# Patient Record
Sex: Female | Born: 1935 | Race: White | Hispanic: No | Marital: Married | Smoking: Former smoker
Health system: Southern US, Community
[De-identification: ages and names within clinical notes are randomized; demographics above are authoritative.]

## PROBLEM LIST (undated history)

## (undated) DIAGNOSIS — K219 Gastro-esophageal reflux disease without esophagitis: Secondary | ICD-10-CM

## (undated) DIAGNOSIS — J45909 Unspecified asthma, uncomplicated: Secondary | ICD-10-CM

## (undated) DIAGNOSIS — I10 Essential (primary) hypertension: Secondary | ICD-10-CM

## (undated) DIAGNOSIS — F322 Major depressive disorder, single episode, severe without psychotic features: Secondary | ICD-10-CM

## (undated) DIAGNOSIS — F17201 Nicotine dependence, unspecified, in remission: Secondary | ICD-10-CM

## (undated) DIAGNOSIS — E039 Hypothyroidism, unspecified: Secondary | ICD-10-CM

## (undated) DIAGNOSIS — E0590 Thyrotoxicosis, unspecified without thyrotoxic crisis or storm: Secondary | ICD-10-CM

## (undated) DIAGNOSIS — M1990 Unspecified osteoarthritis, unspecified site: Secondary | ICD-10-CM

## (undated) DIAGNOSIS — F418 Other specified anxiety disorders: Secondary | ICD-10-CM

## (undated) DIAGNOSIS — R110 Nausea: Secondary | ICD-10-CM

## (undated) DIAGNOSIS — E785 Hyperlipidemia, unspecified: Secondary | ICD-10-CM

## (undated) DIAGNOSIS — F419 Anxiety disorder, unspecified: Secondary | ICD-10-CM

## (undated) DIAGNOSIS — N189 Chronic kidney disease, unspecified: Secondary | ICD-10-CM

## (undated) DIAGNOSIS — R1030 Lower abdominal pain, unspecified: Secondary | ICD-10-CM

## (undated) DIAGNOSIS — Z9889 Other specified postprocedural states: Secondary | ICD-10-CM

## (undated) DIAGNOSIS — R102 Pelvic and perineal pain: Secondary | ICD-10-CM

## (undated) DIAGNOSIS — I129 Hypertensive chronic kidney disease with stage 1 through stage 4 chronic kidney disease, or unspecified chronic kidney disease: Secondary | ICD-10-CM

## (undated) DIAGNOSIS — B0229 Other postherpetic nervous system involvement: Secondary | ICD-10-CM

## (undated) DIAGNOSIS — I2510 Atherosclerotic heart disease of native coronary artery without angina pectoris: Secondary | ICD-10-CM

## (undated) DIAGNOSIS — J449 Chronic obstructive pulmonary disease, unspecified: Secondary | ICD-10-CM

## (undated) DIAGNOSIS — C55 Malignant neoplasm of uterus, part unspecified: Secondary | ICD-10-CM

## (undated) DIAGNOSIS — R197 Diarrhea, unspecified: Secondary | ICD-10-CM

## (undated) DIAGNOSIS — N390 Urinary tract infection, site not specified: Secondary | ICD-10-CM

## (undated) DIAGNOSIS — N183 Chronic kidney disease, stage 3 unspecified: Secondary | ICD-10-CM

## (undated) DIAGNOSIS — R079 Chest pain, unspecified: Secondary | ICD-10-CM

## (undated) DIAGNOSIS — E079 Disorder of thyroid, unspecified: Secondary | ICD-10-CM

## (undated) DIAGNOSIS — A0472 Enterocolitis due to Clostridium difficile, not specified as recurrent: Secondary | ICD-10-CM

## (undated) DIAGNOSIS — G8929 Other chronic pain: Secondary | ICD-10-CM

## (undated) DIAGNOSIS — F329 Major depressive disorder, single episode, unspecified: Secondary | ICD-10-CM

## (undated) DIAGNOSIS — R21 Rash and other nonspecific skin eruption: Secondary | ICD-10-CM

## (undated) DIAGNOSIS — B029 Zoster without complications: Secondary | ICD-10-CM

## (undated) HISTORY — PX: CATARACT EXTRACTION: SUR2

## (undated) HISTORY — DX: Chest pain, unspecified: R07.9

## (undated) HISTORY — DX: Gastro-esophageal reflux disease without esophagitis: K21.9

## (undated) HISTORY — DX: Anxiety disorder, unspecified: F41.9

## (undated) HISTORY — DX: Other specified anxiety disorders: F41.8

## (undated) HISTORY — DX: Enterocolitis due to Clostridium difficile, not specified as recurrent: A04.72

## (undated) HISTORY — DX: Unspecified asthma, uncomplicated: J45.909

## (undated) HISTORY — PX: NISSEN FUNDOPLICATION: SHX2091

## (undated) HISTORY — DX: Nausea: R11.0

## (undated) HISTORY — DX: Atherosclerotic heart disease of native coronary artery without angina pectoris: I25.10

## (undated) HISTORY — DX: Chronic obstructive pulmonary disease, unspecified: J44.9

## (undated) HISTORY — DX: Hyperlipidemia, unspecified: E78.5

## (undated) HISTORY — DX: Diarrhea, unspecified: R19.7

## (undated) HISTORY — DX: Other specified postprocedural states: Z98.890

## (undated) HISTORY — DX: Unspecified osteoarthritis, unspecified site: M19.90

## (undated) HISTORY — DX: Hypothyroidism, unspecified: E03.9

## (undated) HISTORY — DX: Urinary tract infection, site not specified: N39.0

## (undated) HISTORY — DX: Other postherpetic nervous system involvement: B02.29

## (undated) HISTORY — DX: Malignant neoplasm of uterus, part unspecified: C55

## (undated) HISTORY — DX: Hypertensive chronic kidney disease with stage 1 through stage 4 chronic kidney disease, or unspecified chronic kidney disease: I12.9

## (undated) HISTORY — DX: Disorder of thyroid, unspecified: E07.9

## (undated) HISTORY — DX: Other chronic pain: G89.29

## (undated) HISTORY — DX: Chronic kidney disease, stage 3 unspecified: N18.30

## (undated) HISTORY — PX: BREAST LUMPECTOMY: SHX2

## (undated) HISTORY — DX: Nicotine dependence, unspecified, in remission: F17.201

## (undated) HISTORY — DX: Pelvic and perineal pain: R10.2

## (undated) HISTORY — DX: Major depressive disorder, single episode, unspecified: F32.9

## (undated) HISTORY — DX: Major depressive disorder, single episode, severe without psychotic features: F32.2

## (undated) HISTORY — DX: Rash and other nonspecific skin eruption: R21

## (undated) HISTORY — DX: Chronic kidney disease, unspecified: N18.9

## (undated) HISTORY — DX: Lower abdominal pain, unspecified: R10.30

## (undated) HISTORY — PX: ABDOMINAL HYSTERECTOMY: SHX81

---

## 1960-03-27 HISTORY — PX: CHOLECYSTECTOMY: SHX55

## 1992-03-27 DIAGNOSIS — A0472 Enterocolitis due to Clostridium difficile, not specified as recurrent: Secondary | ICD-10-CM

## 1992-03-27 HISTORY — DX: Enterocolitis due to Clostridium difficile, not specified as recurrent: A04.72

## 1999-12-06 ENCOUNTER — Ambulatory Visit (HOSPITAL_COMMUNITY): Admission: RE | Admit: 1999-12-06 | Discharge: 1999-12-06 | Payer: Self-pay | Admitting: Internal Medicine

## 1999-12-15 ENCOUNTER — Encounter: Admission: RE | Admit: 1999-12-15 | Discharge: 1999-12-15 | Payer: Self-pay | Admitting: General Surgery

## 1999-12-15 ENCOUNTER — Encounter: Payer: Self-pay | Admitting: General Surgery

## 2000-01-06 ENCOUNTER — Inpatient Hospital Stay (HOSPITAL_COMMUNITY): Admission: RE | Admit: 2000-01-06 | Discharge: 2000-01-09 | Payer: Self-pay | Admitting: General Surgery

## 2000-01-08 ENCOUNTER — Encounter: Payer: Self-pay | Admitting: General Surgery

## 2000-01-09 ENCOUNTER — Encounter: Payer: Self-pay | Admitting: General Surgery

## 2000-02-10 ENCOUNTER — Encounter: Payer: Self-pay | Admitting: General Surgery

## 2000-02-10 ENCOUNTER — Encounter: Admission: RE | Admit: 2000-02-10 | Discharge: 2000-02-10 | Payer: Self-pay | Admitting: General Surgery

## 2000-03-13 ENCOUNTER — Encounter: Payer: Self-pay | Admitting: Internal Medicine

## 2000-03-13 ENCOUNTER — Ambulatory Visit (HOSPITAL_COMMUNITY): Admission: RE | Admit: 2000-03-13 | Discharge: 2000-03-13 | Payer: Self-pay | Admitting: Internal Medicine

## 2000-08-24 ENCOUNTER — Encounter: Payer: Self-pay | Admitting: Emergency Medicine

## 2000-08-24 ENCOUNTER — Emergency Department (HOSPITAL_COMMUNITY): Admission: EM | Admit: 2000-08-24 | Discharge: 2000-08-24 | Payer: Self-pay | Admitting: Emergency Medicine

## 2000-10-09 ENCOUNTER — Emergency Department (HOSPITAL_COMMUNITY): Admission: EM | Admit: 2000-10-09 | Discharge: 2000-10-09 | Payer: Self-pay | Admitting: Emergency Medicine

## 2000-12-15 ENCOUNTER — Encounter: Payer: Self-pay | Admitting: Internal Medicine

## 2000-12-15 ENCOUNTER — Inpatient Hospital Stay (HOSPITAL_COMMUNITY): Admission: EM | Admit: 2000-12-15 | Discharge: 2000-12-20 | Payer: Self-pay | Admitting: Cardiology

## 2000-12-23 ENCOUNTER — Emergency Department (HOSPITAL_COMMUNITY): Admission: EM | Admit: 2000-12-23 | Discharge: 2000-12-23 | Payer: Self-pay | Admitting: Emergency Medicine

## 2001-02-08 ENCOUNTER — Encounter: Payer: Self-pay | Admitting: Anesthesiology

## 2001-02-12 ENCOUNTER — Ambulatory Visit (HOSPITAL_COMMUNITY): Admission: RE | Admit: 2001-02-12 | Discharge: 2001-02-12 | Payer: Self-pay | Admitting: Ophthalmology

## 2001-06-07 ENCOUNTER — Emergency Department (HOSPITAL_COMMUNITY): Admission: EM | Admit: 2001-06-07 | Discharge: 2001-06-07 | Payer: Self-pay | Admitting: Emergency Medicine

## 2001-06-07 ENCOUNTER — Encounter: Payer: Self-pay | Admitting: Emergency Medicine

## 2001-08-27 ENCOUNTER — Ambulatory Visit (HOSPITAL_COMMUNITY): Admission: RE | Admit: 2001-08-27 | Discharge: 2001-08-27 | Payer: Self-pay | Admitting: Ophthalmology

## 2001-10-25 ENCOUNTER — Ambulatory Visit (HOSPITAL_COMMUNITY): Admission: RE | Admit: 2001-10-25 | Discharge: 2001-10-25 | Payer: Self-pay | Admitting: Pulmonary Disease

## 2001-11-08 ENCOUNTER — Ambulatory Visit (HOSPITAL_COMMUNITY): Admission: RE | Admit: 2001-11-08 | Discharge: 2001-11-08 | Payer: Self-pay | Admitting: Pulmonary Disease

## 2002-02-08 ENCOUNTER — Emergency Department (HOSPITAL_COMMUNITY): Admission: EM | Admit: 2002-02-08 | Discharge: 2002-02-08 | Payer: Self-pay | Admitting: Emergency Medicine

## 2002-07-28 ENCOUNTER — Emergency Department (HOSPITAL_COMMUNITY): Admission: EM | Admit: 2002-07-28 | Discharge: 2002-07-28 | Payer: Self-pay | Admitting: *Deleted

## 2002-07-28 ENCOUNTER — Encounter: Payer: Self-pay | Admitting: *Deleted

## 2002-08-13 ENCOUNTER — Ambulatory Visit (HOSPITAL_COMMUNITY): Admission: RE | Admit: 2002-08-13 | Discharge: 2002-08-13 | Payer: Self-pay | Admitting: Pulmonary Disease

## 2002-08-21 ENCOUNTER — Ambulatory Visit (HOSPITAL_COMMUNITY): Admission: RE | Admit: 2002-08-21 | Discharge: 2002-08-21 | Payer: Self-pay | Admitting: Pulmonary Disease

## 2002-09-03 ENCOUNTER — Observation Stay (HOSPITAL_COMMUNITY): Admission: EM | Admit: 2002-09-03 | Discharge: 2002-09-04 | Payer: Self-pay | Admitting: Cardiovascular Disease

## 2002-09-03 ENCOUNTER — Encounter: Payer: Self-pay | Admitting: Internal Medicine

## 2003-01-07 ENCOUNTER — Ambulatory Visit (HOSPITAL_COMMUNITY): Admission: RE | Admit: 2003-01-07 | Discharge: 2003-01-07 | Payer: Self-pay | Admitting: Pulmonary Disease

## 2003-09-30 ENCOUNTER — Ambulatory Visit (HOSPITAL_COMMUNITY): Admission: RE | Admit: 2003-09-30 | Discharge: 2003-09-30 | Payer: Self-pay | Admitting: Pulmonary Disease

## 2003-10-12 ENCOUNTER — Ambulatory Visit (HOSPITAL_COMMUNITY): Admission: RE | Admit: 2003-10-12 | Discharge: 2003-10-12 | Payer: Self-pay | Admitting: Ophthalmology

## 2004-03-11 ENCOUNTER — Ambulatory Visit (HOSPITAL_COMMUNITY): Admission: RE | Admit: 2004-03-11 | Discharge: 2004-03-11 | Payer: Self-pay | Admitting: Pulmonary Disease

## 2004-03-27 DIAGNOSIS — I2510 Atherosclerotic heart disease of native coronary artery without angina pectoris: Secondary | ICD-10-CM

## 2004-03-27 HISTORY — DX: Atherosclerotic heart disease of native coronary artery without angina pectoris: I25.10

## 2004-05-04 ENCOUNTER — Ambulatory Visit (HOSPITAL_COMMUNITY): Admission: RE | Admit: 2004-05-04 | Discharge: 2004-05-04 | Payer: Self-pay | Admitting: Pulmonary Disease

## 2004-06-17 ENCOUNTER — Emergency Department (HOSPITAL_COMMUNITY): Admission: EM | Admit: 2004-06-17 | Discharge: 2004-06-17 | Payer: Self-pay | Admitting: Emergency Medicine

## 2004-06-30 ENCOUNTER — Ambulatory Visit (HOSPITAL_COMMUNITY): Admission: RE | Admit: 2004-06-30 | Discharge: 2004-06-30 | Payer: Self-pay | Admitting: General Surgery

## 2004-10-01 ENCOUNTER — Emergency Department (HOSPITAL_COMMUNITY): Admission: EM | Admit: 2004-10-01 | Discharge: 2004-10-01 | Payer: Self-pay | Admitting: Emergency Medicine

## 2004-10-30 ENCOUNTER — Observation Stay (HOSPITAL_COMMUNITY): Admission: EM | Admit: 2004-10-30 | Discharge: 2004-11-02 | Payer: Self-pay | Admitting: Emergency Medicine

## 2004-10-31 ENCOUNTER — Ambulatory Visit: Payer: Self-pay | Admitting: *Deleted

## 2004-10-31 ENCOUNTER — Ambulatory Visit: Payer: Self-pay | Admitting: Urgent Care

## 2005-03-27 DIAGNOSIS — Z9889 Other specified postprocedural states: Secondary | ICD-10-CM

## 2005-03-27 HISTORY — DX: Other specified postprocedural states: Z98.890

## 2005-07-26 ENCOUNTER — Ambulatory Visit: Payer: Self-pay | Admitting: Internal Medicine

## 2005-07-28 ENCOUNTER — Ambulatory Visit (HOSPITAL_COMMUNITY): Admission: RE | Admit: 2005-07-28 | Discharge: 2005-07-28 | Payer: Self-pay | Admitting: Internal Medicine

## 2005-07-28 ENCOUNTER — Ambulatory Visit: Payer: Self-pay | Admitting: Internal Medicine

## 2005-08-16 ENCOUNTER — Ambulatory Visit (HOSPITAL_COMMUNITY): Admission: RE | Admit: 2005-08-16 | Discharge: 2005-08-16 | Payer: Self-pay | Admitting: Pulmonary Disease

## 2005-09-14 ENCOUNTER — Ambulatory Visit (HOSPITAL_COMMUNITY): Admission: RE | Admit: 2005-09-14 | Discharge: 2005-09-14 | Payer: Self-pay | Admitting: Pulmonary Disease

## 2006-03-15 ENCOUNTER — Ambulatory Visit (HOSPITAL_COMMUNITY): Admission: RE | Admit: 2006-03-15 | Discharge: 2006-03-15 | Payer: Self-pay | Admitting: Pulmonary Disease

## 2006-04-03 ENCOUNTER — Ambulatory Visit: Payer: Self-pay | Admitting: Internal Medicine

## 2006-05-10 ENCOUNTER — Ambulatory Visit: Payer: Self-pay | Admitting: Internal Medicine

## 2006-05-31 ENCOUNTER — Ambulatory Visit (HOSPITAL_COMMUNITY): Admission: RE | Admit: 2006-05-31 | Discharge: 2006-05-31 | Payer: Self-pay | Admitting: Pulmonary Disease

## 2006-08-30 ENCOUNTER — Ambulatory Visit: Payer: Self-pay | Admitting: Internal Medicine

## 2006-10-03 ENCOUNTER — Ambulatory Visit (HOSPITAL_COMMUNITY): Admission: RE | Admit: 2006-10-03 | Discharge: 2006-10-03 | Payer: Self-pay | Admitting: Pulmonary Disease

## 2007-03-14 ENCOUNTER — Ambulatory Visit: Payer: Self-pay | Admitting: Internal Medicine

## 2007-03-28 DIAGNOSIS — Z9889 Other specified postprocedural states: Secondary | ICD-10-CM

## 2007-03-28 HISTORY — DX: Other specified postprocedural states: Z98.890

## 2007-07-17 ENCOUNTER — Ambulatory Visit: Payer: Self-pay | Admitting: Internal Medicine

## 2007-09-21 ENCOUNTER — Emergency Department (HOSPITAL_COMMUNITY): Admission: EM | Admit: 2007-09-21 | Discharge: 2007-09-21 | Payer: Self-pay | Admitting: Emergency Medicine

## 2007-09-24 ENCOUNTER — Ambulatory Visit: Payer: Self-pay | Admitting: Gastroenterology

## 2007-09-26 ENCOUNTER — Ambulatory Visit (HOSPITAL_COMMUNITY): Admission: RE | Admit: 2007-09-26 | Discharge: 2007-09-26 | Payer: Self-pay | Admitting: Gastroenterology

## 2007-11-09 ENCOUNTER — Emergency Department (HOSPITAL_COMMUNITY): Admission: EM | Admit: 2007-11-09 | Discharge: 2007-11-09 | Payer: Self-pay | Admitting: Emergency Medicine

## 2007-11-27 ENCOUNTER — Ambulatory Visit (HOSPITAL_COMMUNITY): Admission: RE | Admit: 2007-11-27 | Discharge: 2007-11-27 | Payer: Self-pay | Admitting: Pulmonary Disease

## 2008-01-14 ENCOUNTER — Ambulatory Visit: Payer: Self-pay | Admitting: Internal Medicine

## 2008-01-16 ENCOUNTER — Ambulatory Visit: Payer: Self-pay | Admitting: Internal Medicine

## 2008-01-16 ENCOUNTER — Ambulatory Visit (HOSPITAL_COMMUNITY): Admission: RE | Admit: 2008-01-16 | Discharge: 2008-01-16 | Payer: Self-pay | Admitting: Internal Medicine

## 2008-03-02 ENCOUNTER — Ambulatory Visit: Payer: Self-pay | Admitting: Internal Medicine

## 2008-03-02 ENCOUNTER — Encounter: Payer: Self-pay | Admitting: Gastroenterology

## 2008-03-02 LAB — CONVERTED CEMR LAB
ALT: 14 units/L (ref 0–35)
Alkaline Phosphatase: 77 units/L (ref 39–117)
Basophils Absolute: 0.1 10*3/uL (ref 0.0–0.1)
Basophils Relative: 1 % (ref 0–1)
CO2: 23 meq/L (ref 19–32)
Creatinine, Ser: 1.32 mg/dL — ABNORMAL HIGH (ref 0.40–1.20)
Eosinophils Absolute: 0.2 10*3/uL (ref 0.0–0.7)
Eosinophils Relative: 3 % (ref 0–5)
HCT: 41.5 % (ref 36.0–46.0)
Hemoglobin: 13.8 g/dL (ref 12.0–15.0)
MCHC: 33.3 g/dL (ref 30.0–36.0)
MCV: 88.9 fL (ref 78.0–100.0)
Monocytes Absolute: 0.4 10*3/uL (ref 0.1–1.0)
RDW: 13.3 % (ref 11.5–15.5)
Total Bilirubin: 0.5 mg/dL (ref 0.3–1.2)

## 2008-03-04 ENCOUNTER — Ambulatory Visit (HOSPITAL_COMMUNITY): Admission: RE | Admit: 2008-03-04 | Discharge: 2008-03-04 | Payer: Self-pay | Admitting: Internal Medicine

## 2008-03-11 ENCOUNTER — Ambulatory Visit (HOSPITAL_COMMUNITY): Admission: RE | Admit: 2008-03-11 | Discharge: 2008-03-11 | Payer: Self-pay | Admitting: Internal Medicine

## 2008-03-19 ENCOUNTER — Encounter: Payer: Self-pay | Admitting: Gastroenterology

## 2008-03-19 LAB — CONVERTED CEMR LAB
CO2: 24 meq/L (ref 19–32)
Calcium: 9.2 mg/dL (ref 8.4–10.5)
Glucose, Bld: 79 mg/dL (ref 70–99)
Sodium: 140 meq/L (ref 135–145)

## 2008-07-07 ENCOUNTER — Telehealth (INDEPENDENT_AMBULATORY_CARE_PROVIDER_SITE_OTHER): Payer: Self-pay

## 2008-07-14 ENCOUNTER — Observation Stay (HOSPITAL_COMMUNITY): Admission: EM | Admit: 2008-07-14 | Discharge: 2008-07-16 | Payer: Self-pay | Admitting: Emergency Medicine

## 2008-07-14 ENCOUNTER — Ambulatory Visit: Payer: Self-pay | Admitting: Cardiology

## 2008-07-15 ENCOUNTER — Encounter: Payer: Self-pay | Admitting: Cardiology

## 2008-07-29 ENCOUNTER — Ambulatory Visit: Payer: Self-pay | Admitting: Cardiology

## 2008-08-06 ENCOUNTER — Encounter: Payer: Self-pay | Admitting: Internal Medicine

## 2008-09-15 ENCOUNTER — Ambulatory Visit (HOSPITAL_COMMUNITY): Admission: RE | Admit: 2008-09-15 | Discharge: 2008-09-15 | Payer: Self-pay | Admitting: Pulmonary Disease

## 2008-09-25 ENCOUNTER — Encounter: Payer: Self-pay | Admitting: Cardiology

## 2008-10-12 ENCOUNTER — Telehealth (INDEPENDENT_AMBULATORY_CARE_PROVIDER_SITE_OTHER): Payer: Self-pay

## 2008-10-23 ENCOUNTER — Encounter: Payer: Self-pay | Admitting: Gastroenterology

## 2008-10-30 DIAGNOSIS — E039 Hypothyroidism, unspecified: Secondary | ICD-10-CM | POA: Insufficient documentation

## 2008-10-30 DIAGNOSIS — J45909 Unspecified asthma, uncomplicated: Secondary | ICD-10-CM | POA: Insufficient documentation

## 2008-11-02 ENCOUNTER — Ambulatory Visit: Payer: Self-pay | Admitting: Cardiology

## 2008-11-02 ENCOUNTER — Encounter (INDEPENDENT_AMBULATORY_CARE_PROVIDER_SITE_OTHER): Payer: Self-pay | Admitting: *Deleted

## 2008-11-02 DIAGNOSIS — N184 Chronic kidney disease, stage 4 (severe): Secondary | ICD-10-CM | POA: Insufficient documentation

## 2008-11-02 DIAGNOSIS — N183 Chronic kidney disease, stage 3 (moderate): Secondary | ICD-10-CM

## 2008-11-02 DIAGNOSIS — R002 Palpitations: Secondary | ICD-10-CM

## 2008-11-02 LAB — CONVERTED CEMR LAB
Albumin: 4.1 g/dL
CO2: 22 meq/L
Calcium: 9.1 mg/dL
Glucose, Bld: 75 mg/dL
Potassium: 4.2 meq/L
Sodium: 141 meq/L

## 2008-11-09 ENCOUNTER — Ambulatory Visit (HOSPITAL_COMMUNITY): Admission: RE | Admit: 2008-11-09 | Discharge: 2008-11-09 | Payer: Self-pay | Admitting: Pulmonary Disease

## 2008-11-16 ENCOUNTER — Encounter: Payer: Self-pay | Admitting: Cardiology

## 2009-01-21 ENCOUNTER — Emergency Department (HOSPITAL_COMMUNITY): Admission: EM | Admit: 2009-01-21 | Discharge: 2009-01-21 | Payer: Self-pay | Admitting: Emergency Medicine

## 2009-04-30 ENCOUNTER — Encounter (INDEPENDENT_AMBULATORY_CARE_PROVIDER_SITE_OTHER): Payer: Self-pay | Admitting: *Deleted

## 2009-06-16 ENCOUNTER — Ambulatory Visit (HOSPITAL_COMMUNITY): Admission: RE | Admit: 2009-06-16 | Discharge: 2009-06-16 | Payer: Self-pay | Admitting: Pulmonary Disease

## 2009-06-16 ENCOUNTER — Encounter (INDEPENDENT_AMBULATORY_CARE_PROVIDER_SITE_OTHER): Payer: Self-pay | Admitting: *Deleted

## 2009-06-16 LAB — CONVERTED CEMR LAB
ALT: 15 units/L
Alkaline Phosphatase: 101 units/L
BUN: 17 mg/dL
Basophils Absolute: 0 10*3/uL
Bilirubin, Direct: 0.1 mg/dL
CO2: 25 meq/L
Calcium: 9.6 mg/dL
Chloride: 102 meq/L
Cholesterol: 171 mg/dL
Creatinine, Ser: 1.21 mg/dL
Glucose, Bld: 90 mg/dL
Hemoglobin: 14.1 g/dL
LDL Cholesterol: 91 mg/dL
Lymphocytes Relative: 34 %
MCV: 90.6 fL
Monocytes Absolute: 0.5 10*3/uL
Monocytes Relative: 9 %
Platelets: 244 10*3/uL
RDW: 13.9 %
Sodium: 137 meq/L
Sodium: 137 meq/L
TSH: 3.267 microintl units/mL
TSH: 3.267 microintl units/mL
Total Protein: 7.4 g/dL
Total Protein: 7.4 g/dL
Triglycerides: 84 mg/dL
WBC: 5.1 10*3/uL
WBC: 5.1 10*3/uL

## 2009-06-21 ENCOUNTER — Ambulatory Visit (HOSPITAL_COMMUNITY): Admission: RE | Admit: 2009-06-21 | Discharge: 2009-06-21 | Payer: Self-pay | Admitting: Pulmonary Disease

## 2009-07-23 ENCOUNTER — Encounter (INDEPENDENT_AMBULATORY_CARE_PROVIDER_SITE_OTHER): Payer: Self-pay | Admitting: *Deleted

## 2009-07-26 ENCOUNTER — Ambulatory Visit: Payer: Self-pay | Admitting: Cardiology

## 2009-08-03 ENCOUNTER — Encounter: Payer: Self-pay | Admitting: Cardiology

## 2009-09-20 ENCOUNTER — Ambulatory Visit: Payer: Self-pay | Admitting: Cardiology

## 2009-09-20 ENCOUNTER — Observation Stay (HOSPITAL_COMMUNITY): Admission: EM | Admit: 2009-09-20 | Discharge: 2009-09-22 | Payer: Self-pay | Admitting: Emergency Medicine

## 2009-09-21 ENCOUNTER — Encounter: Payer: Self-pay | Admitting: Cardiology

## 2009-12-18 ENCOUNTER — Emergency Department (HOSPITAL_COMMUNITY): Admission: EM | Admit: 2009-12-18 | Discharge: 2009-12-18 | Payer: Self-pay | Admitting: Emergency Medicine

## 2010-01-06 ENCOUNTER — Ambulatory Visit (HOSPITAL_COMMUNITY): Admission: RE | Admit: 2010-01-06 | Discharge: 2010-01-06 | Payer: Self-pay | Admitting: Pulmonary Disease

## 2010-02-01 ENCOUNTER — Encounter (INDEPENDENT_AMBULATORY_CARE_PROVIDER_SITE_OTHER): Payer: Self-pay | Admitting: *Deleted

## 2010-02-02 ENCOUNTER — Ambulatory Visit: Payer: Self-pay | Admitting: Cardiology

## 2010-03-03 ENCOUNTER — Ambulatory Visit: Payer: Self-pay | Admitting: Internal Medicine

## 2010-04-24 LAB — CONVERTED CEMR LAB
ALT: 15 units/L (ref 0–35)
CO2: 22 meq/L (ref 19–32)
Creatinine, Ser: 1.27 mg/dL — ABNORMAL HIGH (ref 0.40–1.20)
Glucose, Bld: 75 mg/dL (ref 70–99)
Total Bilirubin: 0.2 mg/dL — ABNORMAL LOW (ref 0.3–1.2)

## 2010-04-26 NOTE — Assessment & Plan Note (Signed)
Summary: 8 MTH F/U PER CHECKOUT ON 11/02/08/TG   Visit Type:  Follow-up Primary Provider:  Dr. Sinda Du  CC:  .Marland Kitchen  History of Present Illness: [name] is seen for continuing intermittent chest discomfort, a history of noncritical coronary disease and multiple cardiovascular risk factors.  She has remained depressed and anxious since her mother died 6 months ago.  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  In recent days, [name] has noted the recurrence of left-sided chest discomfort.  This is located across the upper chest and is moderate in severity.  There is no pleuritic component.  There is no associated dyspnea nor diaphoresis.  There is chest wall tenderness.  Symptoms have been fairly well relieved with one Darvocet as needed.  Preventive Screening-Counseling & Management  Alcohol-Tobacco     Smoking Status: current     Smoking Cessation Counseling: yes     Packs/Day: 1 PPD  Current Medications (verified): 1)  Lipitor 10 Mg Tabs (Atorvastatin Calcium) .Marland Kitchen.. 1 Tab At Bedtime 2)  Xanax 0.5 Mg Tabs (Alprazolam) .Marland Kitchen.. 1 Tab Four Times A Day 3)  Synthroid 75 Mcg Tabs (Levothyroxine Sodium) .Marland Kitchen.. 1 Tab Once Daily 4)  Aspirin 81 Mg Tbec (Aspirin) .... Take One Tablet By Mouth Daily 5)  Omeprazole 40 Mg Cpdr (Omeprazole) .... Take 1 Tablet By Mouth Two Times A Day 6)  Nicoderm Cq 14 Mg/24hr Pt24 (Nicotine) .... Every 12 Hrs. 7)  Verelan Pm 300 Mg Xr24h-Cap (Verapamil Hcl) .... Take 1 Tablet By Mouth Once A Day At Bedtime 8)  Fish Oil  Oil (Fish Oil) .... Take 1 Tablet By Mouth Once A Day 9)  Darvocet-N 100 100-650 Mg Tabs (Propoxyphene N-Apap) .... One Tablet By Mouth Twice A Day As Needed 10)  Aleve 220 Mg Tabs (Naproxen Sodium) .... Take 1-2 Tablets By Mouth Two Times A Day or Three Times A Day As Needed Chest Pain  Allergies (verified): 1)  ! Paxil 2)  ! Effexor 3)  ! *  Pravastatin  Comments:  Nurse/Medical Assistant: The patient's medications and allergies were reviewed with the patient and were updated in the Medication and Allergy Lists. Bottles and verbally reviewed.  Past History:  PMH, FH, and Social History reviewed and updated.  Family History: Mother: died in January 27, 2009; Alzheimer Disease, Atrial Fibrillation and a HX of MI; CHF with preserved LV systolic function Father:died in his 4's from a MI Siblings: Brother died in his 20's from a MI  Social History: Smoking Status:  current Packs/Day:  1 PPD  Review of Systems       See history of present illness.  Vital Signs:  Patient profile:   75 year old female Height:      60 inches Weight:      145 pounds Pulse rate:   64 / minute BP sitting:   146 / 85  (left arm) Cuff size:   large  Vitals Entered By: Georgina Peer (Jul 26, 2009 2:32 PM) CC: .   Physical Exam  General:    Anxious; overweight; well developed; no acute distress:   Neck-No JVD; no carotid bruits: Lungs-No tachypnea, no rales; no rhonchi; no wheezes; prolonged expiratory phase Cardiovascular-normal PMI; normal S1 and S2; chest wall tender to palpation. Abdomen-BS normal; soft and non-tender without masses or organomegaly:  Musculoskeletal-No deformities, no cyanosis or clubbing: Neurologic-Normal cranial nerves; symmetric strength and tone:  Skin-Warm, no significant lesions: Extremities-Nl distal pulses; trace edema:     Impression & Recommendations:  Problem # 1:  ATHEROSCLEROTIC CARDIOVASCULAR DISEASE-NONOBSTRUCTIVE (ICD-429.2) Patient had single vessel disease with a 50% first diagonal lesion at her last catheterization in 2004.  Current chest discomfort is very atypical and likely reflects a chest wall etiology.  I have asked her to try Aleve 200 mg b.i.d. or t.i.d. as needed plus topical heat.  She will supplement this with Darvocet if necessary.   Problem # 2:  HYPERLIPIDEMIA (P102836.4) Most  recent lipid profile one year ago was good.  Problem # 3:  HYPERTENSION (ICD-401.9) Blood pressure control is good.  Current medication will be continued.  Problem # 4:  TOBACCO ABUSE (ICD-305.1) Once again, we discussed the need to discontinue use of tobacco products.  Patient reports that she was close to quitting at the time of her mother's death, but has increased usage due to subsequent stress.  She developed nausea with a 21 mg nicotine patch.  She will attempt to quit once again using 14 mg patches.  Problem # 5:  ANXIETY (ICD-300.00) She takes one or 2 alprazolam 0.5 mg per day as needed.  She is highly resistant to considering use of antidepressant drugs.  I have asked her to seek evaluation with [facility] and will plan to reassess this nice woman in 6 months.  Other Orders: Misc. Referral (Misc. Ref)  Patient Instructions: 1)  Your physician recommends that you schedule a follow-up appointment in:  6 months 2)  Your physician has recommended you make the following change in your medication: aleve 1-2 tablets by mouth two times a day or three times a day as needed chest pain 3)  moist heat to left chest wall 4)  referral to behavioral health for anxiety depression prolonged grief reaction

## 2010-04-26 NOTE — Miscellaneous (Signed)
Summary: LABS CBCD,BMPLIPIDS,LIVER,A1C,TSH,06/16/2009  Clinical Lists Changes  Observations: Added new observation of CALCIUM: 9.6 mg/dL (06/16/2009 17:11) Added new observation of ALBUMIN: 4.5 g/dL (06/16/2009 17:11) Added new observation of PROTEIN, TOT: 7.4 g/dL (06/16/2009 17:11) Added new observation of SGPT (ALT): 15 units/L (06/16/2009 17:11) Added new observation of SGOT (AST): 20 units/L (06/16/2009 17:11) Added new observation of ALK PHOS: 101 units/L (06/16/2009 17:11) Added new observation of BILI DIRECT: 0.1 mg/dL (06/16/2009 17:11) Added new observation of CREATININE: 1.21 mg/dL (06/16/2009 17:11) Added new observation of BUN: 17 mg/dL (06/16/2009 17:11) Added new observation of BG RANDOM: 90 mg/dL (06/16/2009 17:11) Added new observation of CO2 PLSM/SER: 25 meq/L (06/16/2009 17:11) Added new observation of CL SERUM: 102 meq/L (06/16/2009 17:11) Added new observation of K SERUM: 4.9 meq/L (06/16/2009 17:11) Added new observation of NA: 137 meq/L (06/16/2009 17:11) Added new observation of LDL: 91 mg/dL (06/16/2009 17:11) Added new observation of HDL: 63 mg/dL (06/16/2009 17:11) Added new observation of TRIGLYC TOT: 84 mg/dL (06/16/2009 17:11) Added new observation of CHOLESTEROL: 171 mg/dL (06/16/2009 17:11) Added new observation of PLATELETK/UL: 244 K/uL (06/16/2009 17:11) Added new observation of MCV: 90.6 fL (06/16/2009 17:11) Added new observation of HCT: 43.5 % (06/16/2009 17:11) Added new observation of HGB: 14.1 g/dL (06/16/2009 17:11) Added new observation of WBC COUNT: 5.1 10*3/microliter (06/16/2009 17:11) Added new observation of TSH: 3.267 microintl units/mL (06/16/2009 17:11)

## 2010-04-26 NOTE — Miscellaneous (Signed)
Summary: labs cbcd,bmplipids,liver,tsh,06/16/2009  Clinical Lists Changes  Observations: Added new observation of CALCIUM: 9.6 mg/dL (06/16/2009 10:14) Added new observation of ALBUMIN: 4.5 g/dL (06/16/2009 10:14) Added new observation of PROTEIN, TOT: 7.4 g/dL (06/16/2009 10:14) Added new observation of SGPT (ALT): 15 units/L (06/16/2009 10:14) Added new observation of SGOT (AST): 20 units/L (06/16/2009 10:14) Added new observation of ALK PHOS: 101 units/L (06/16/2009 10:14) Added new observation of BILI DIRECT: 0.1 mg/dL (06/16/2009 10:14) Added new observation of CREATININE: 1.21 mg/dL (06/16/2009 10:14) Added new observation of BUN: 17 mg/dL (06/16/2009 10:14) Added new observation of BG RANDOM: 90 mg/dL (06/16/2009 10:14) Added new observation of CO2 PLSM/SER: 25 meq/L (06/16/2009 10:14) Added new observation of CL SERUM: 102 meq/L (06/16/2009 10:14) Added new observation of K SERUM: 4.9 meq/L (06/16/2009 10:14) Added new observation of NA: 137 meq/L (06/16/2009 10:14) Added new observation of LDL: 91 mg/dL (06/16/2009 10:14) Added new observation of HDL: 63 mg/dL (06/16/2009 10:14) Added new observation of TRIGLYC TOT: 84 mg/dL (06/16/2009 10:14) Added new observation of CHOLESTEROL: 171 mg/dL (06/16/2009 10:14) Added new observation of TSH: 3.267 microintl units/mL (06/16/2009 10:14) Added new observation of HGBA1C: 5.3 % (06/16/2009 10:14) Added new observation of ABSOLUTE BAS: 0.0 K/uL (06/16/2009 10:14) Added new observation of BASOPHIL %: 1 % (06/16/2009 10:14) Added new observation of EOS ABSLT: 0.1 K/uL (06/16/2009 10:14) Added new observation of % EOS AUTO: 2 % (06/16/2009 10:14) Added new observation of ABSOLUTE MON: 0.5 K/uL (06/16/2009 10:14) Added new observation of MONOCYTE %: 9 % (06/16/2009 10:14) Added new observation of ABS LYMPHOCY: 1.7 K/uL (06/16/2009 10:14) Added new observation of LYMPHS %: 34 % (06/16/2009 10:14) Added new observation of PLATELETK/UL: 244  K/uL (06/16/2009 10:14) Added new observation of RDW: 13.9 % (06/16/2009 10:14) Added new observation of MCHC RBC: 32.4 g/dL (06/16/2009 10:14) Added new observation of MCV: 90.6 fL (06/16/2009 10:14) Added new observation of HCT: 43.5 % (06/16/2009 10:14) Added new observation of HGB: 14.1 g/dL (06/16/2009 10:14) Added new observation of RBC M/UL: 4.80 M/uL (06/16/2009 10:14) Added new observation of WBC COUNT: 5.1 10*3/microliter (06/16/2009 10:14)

## 2010-04-26 NOTE — Assessment & Plan Note (Signed)
Summary: 6 MTH F/U PER CHECKOUT ON 07/26/09/TG   Visit Type:  Follow-up Primary Provider:  Dr. Sinda Du   History of Present Illness: [name] returns to the office as scheduled for continued assessment and treatment of chest pain, cardiovascular risk factors and depression.  She appears to have recovered somewhat from the death of her mother, but has still lost some of her enthusiasm for living.  She denies suicidal ideation.  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  [name] has difficulty sleeping and generalized fatigue but no specific cardiopulmonary symptoms.  Patient does not have substantial health concerns, but wonders why her right leg has a greater diameter than her left.  Current Medications (verified): 1)  Lipitor 10 Mg Tabs (Atorvastatin Calcium) .Marland Kitchen.. 1 Tab At Bedtime 2)  Xanax 0.5 Mg Tabs (Alprazolam) .Marland Kitchen.. 1 Tab Four Times A Day 3)  Synthroid 75 Mcg Tabs (Levothyroxine Sodium) .Marland Kitchen.. 1 Tab Once Daily 4)  Aspirin 81 Mg Tbec (Aspirin) .... Take One Tablet By Mouth Daily 5)  Omeprazole 40 Mg Cpdr (Omeprazole) .... Take 1 Tablet By Mouth Two Times A Day 6)  Verelan Pm 300 Mg Xr24h-Cap (Verapamil Hcl) .... Take 1 Tablet By Mouth Once A Day At Bedtime 7)  Fish Oil  Oil (Fish Oil) .... Take 1 Tablet By Mouth Once A Day 8)  Darvocet-N 100 100-650 Mg Tabs (Propoxyphene N-Apap) .... One Tablet As Needed  Allergies (verified): 1)  ! Paxil 2)  ! Effexor 3)  ! * Pravastatin  Comments:  Nurse/Medical Assistant: patient brought med list and stated that all meds are correct patient stated  that Dr.Hawkins started her on tekturna 150 mg  Past History:  PMH, FH, and Social History reviewed and updated.  Past Surgical History: Abdominal Hysterectomy-Total Cataract Extraction Cholecystectomy-1962 Lumpectomy Nissen Fundoplication Colonoscopy-?date uncertain  Review of  Systems       See history of present illness.  Vital Signs:  Patient profile:   75 year old female Weight:      145 pounds BMI:     28.42 O2 Sat:      99 % on Room air Pulse rate:   73 / minute BP sitting:   127 / 83  (right arm)  Vitals Entered By: Doretha Sou, CNA (February 02, 2010 2:01 PM)  O2 Flow:  Room air  Physical Exam  General:   well developed; no acute distress:   Neck-No JVD; no carotid bruits: Lungs-No tachypnea, no rales; no rhonchi; no wheezes; prolonged expiratory phase Cardiovascular-normal PMI; normal S1 and S2; modest basilar systolic ejection murmur Abdomen-BS normal; soft and non-tender without masses or organomegaly:  Musculoskeletal-No deformities, no cyanosis or clubbing: Neurologic-Normal cranial nerves; symmetric strength and tone:  Skin-Warm, no significant lesions: Extremities-Nl distal pulses; trace edema: diameter of right leg is greater at the knee and thigh where there are significant surface varicosities and trace edema.  This appears primarily related to osteoarthritic changes around the knee     Impression & Recommendations:  Problem # 1:  ATHEROSCLEROTIC CARDIOVASCULAR DISEASE-NONOBSTRUCTIVE (ICD-429.2) There are no symptoms to suggest progression of disease.  Our current approach of managing cardiovascular risk factors to the extent possible will be continued.  Problem # 2:  HYPERLIPIDEMIA (P102836.4) Lipid profile from earlier this year was acceptable.  Current therapy will be continued.  CHOL: 171 (06/16/2009)   LDL: 91 (06/16/2009)   HDL: 63 (06/16/2009)  TG: 84 (06/16/2009)  Problem # 3:  HYPERTENSION (ICD-401.9) Blood pressure control is good on current medication.  Problem # 4:  DEPRESSION (ICD-311) Patient has some vegetative signs, but is markedly improved over the last 6 months.  She now apparently has a new older individual to whom she can provide care, which is probably helping her as much as her medication.  Patient  refused to meet with a counselor and continues to do so.  At this point, it appears that she will recover based upon her own efforts.  Patient Instructions: 1)  Your physician recommends that you schedule a follow-up appointment in: 1 year

## 2010-04-26 NOTE — Miscellaneous (Signed)
Summary: LABS CMP,11/02/2008  Clinical Lists Changes  Observations: Added new observation of CALCIUM: 9.1 mg/dL (11/02/2008 14:57) Added new observation of ALBUMIN: 4.1 g/dL (11/02/2008 14:57) Added new observation of PROTEIN, TOT: 7.0 g/dL (11/02/2008 14:57) Added new observation of SGPT (ALT): 15 units/L (11/02/2008 14:57) Added new observation of SGOT (AST): 17 units/L (11/02/2008 14:57) Added new observation of ALK PHOS: 91 units/L (11/02/2008 14:57) Added new observation of CREATININE: 1.27 mg/dL (11/02/2008 14:57) Added new observation of BUN: 22 mg/dL (11/02/2008 14:57) Added new observation of BG RANDOM: 75 mg/dL (11/02/2008 14:57) Added new observation of CO2 PLSM/SER: 22 meq/L (11/02/2008 14:57) Added new observation of CL SERUM: 105 meq/L (11/02/2008 14:57) Added new observation of K SERUM: 4.2 meq/L (11/02/2008 14:57) Added new observation of NA: 141 meq/L (11/02/2008 14:57)

## 2010-04-26 NOTE — Letter (Signed)
Summary: pt canceled behavioral health appt  pt canceled behavioral health appt   Imported By: [place] Crane 08/03/2009 16:02:09  _____________________________________________________________________  External Attachment:    Type:   Image     Comment:   External Document

## 2010-04-26 NOTE — Consult Note (Signed)
Summary: [facility] - Dr. Lattie Haw, Cardiology  [facility]   Imported By: Sallee Provencal 10/06/2009 14:54:55  _____________________________________________________________________  External Attachment:    Type:   Image     Comment:   External Document

## 2010-05-02 ENCOUNTER — Encounter: Payer: Self-pay | Admitting: Internal Medicine

## 2010-05-02 ENCOUNTER — Ambulatory Visit (INDEPENDENT_AMBULATORY_CARE_PROVIDER_SITE_OTHER): Payer: Medicare Other | Admitting: Gastroenterology

## 2010-05-02 ENCOUNTER — Encounter: Payer: Self-pay | Admitting: Gastroenterology

## 2010-05-02 DIAGNOSIS — R109 Unspecified abdominal pain: Secondary | ICD-10-CM | POA: Insufficient documentation

## 2010-05-02 DIAGNOSIS — R1310 Dysphagia, unspecified: Secondary | ICD-10-CM

## 2010-05-02 DIAGNOSIS — K219 Gastro-esophageal reflux disease without esophagitis: Secondary | ICD-10-CM

## 2010-05-02 DIAGNOSIS — R1319 Other dysphagia: Secondary | ICD-10-CM | POA: Insufficient documentation

## 2010-05-04 ENCOUNTER — Encounter (INDEPENDENT_AMBULATORY_CARE_PROVIDER_SITE_OTHER): Payer: Medicare Other | Admitting: Internal Medicine

## 2010-05-04 ENCOUNTER — Encounter: Payer: Self-pay | Admitting: Internal Medicine

## 2010-05-04 DIAGNOSIS — R1084 Generalized abdominal pain: Secondary | ICD-10-CM

## 2010-05-09 DIAGNOSIS — R1310 Dysphagia, unspecified: Secondary | ICD-10-CM

## 2010-05-09 DIAGNOSIS — R109 Unspecified abdominal pain: Secondary | ICD-10-CM

## 2010-05-10 ENCOUNTER — Other Ambulatory Visit: Payer: Self-pay | Admitting: Internal Medicine

## 2010-05-10 ENCOUNTER — Ambulatory Visit (HOSPITAL_COMMUNITY)
Admission: RE | Admit: 2010-05-10 | Discharge: 2010-05-10 | Disposition: A | Discharge status: Home / Self Care | Payer: Medicare Other | Source: Ambulatory Visit | Attending: Internal Medicine | Admitting: Internal Medicine

## 2010-05-10 ENCOUNTER — Encounter: Payer: Medicare Other | Admitting: Internal Medicine

## 2010-05-10 DIAGNOSIS — Z79899 Other long term (current) drug therapy: Secondary | ICD-10-CM | POA: Insufficient documentation

## 2010-05-10 DIAGNOSIS — I10 Essential (primary) hypertension: Secondary | ICD-10-CM | POA: Insufficient documentation

## 2010-05-10 DIAGNOSIS — Z7982 Long term (current) use of aspirin: Secondary | ICD-10-CM | POA: Insufficient documentation

## 2010-05-10 DIAGNOSIS — R109 Unspecified abdominal pain: Secondary | ICD-10-CM | POA: Insufficient documentation

## 2010-05-10 DIAGNOSIS — R1310 Dysphagia, unspecified: Secondary | ICD-10-CM | POA: Insufficient documentation

## 2010-05-10 HISTORY — PX: ESOPHAGOGASTRODUODENOSCOPY: SHX1529

## 2010-05-12 ENCOUNTER — Encounter: Payer: Self-pay | Admitting: Internal Medicine

## 2010-05-12 NOTE — Letter (Signed)
Summary: EGD/ED ORDER  EGD/ED ORDER   Imported By: Sofie Rower 05/02/2010 15:08:52  _____________________________________________________________________  External Attachment:    Type:   Image     Comment:   External Document

## 2010-05-12 NOTE — Assessment & Plan Note (Signed)
Summary: dropped off stool sample  pt returned ifobt and it was negative  Allergies: 1)  ! Paxil 2)  ! Effexor 3)  ! * Pravastatin 4)  ! Vicodin 5)  ! Nubain   Other Orders: Immuno-chemical Fecal Occult [id number])   Orders Added: 1)  Immuno-chemical Fecal Occult [id number]
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
## 2010-05-17 NOTE — Op Note (Signed)
  NAME:  [name], [name]                ACCOUNT NO.:  [id number]  MEDICAL RECORD NO.:  [id number]           PATIENT TYPE:  O  LOCATION:  DAYP                          FACILITY:  [facility]  PHYSICIAN:  R. Garfield Cornea, M.D. DATE OF BIRTH:  Sep 14, 1935  DATE OF PROCEDURE:  05/10/2010 DATE OF DISCHARGE:                              OPERATIVE REPORT   INDICATIONS FOR PROCEDURE:  A 75 year old lady with recent nonspecific upper abdominal pain and recurrent esophageal dysphagia, history of a Nissen fundoplication in distant past, reflux symptoms well controlled with omeprazole 40 mg orally twice daily.  EGD now being done potential for esophageal dilation, etc. reviewed.  Please see documentation in the medical record for more information.  PROCEDURE NOTE:  O2 saturation, blood pressure, pulse, and respirations were monitored throughout the entirety of the procedure.  CONSCIOUS SEDATION:  Versed 5 mg IV, Demerol 75 mg IV in divided doses, Phenergan 12.5 mg IV diluted slow IV push to augment conscious sedation. Cetacaine spray for topical pharyngeal anesthesia.  FINDINGS:  Examination of tubular esophagus revealed a 3-4-mm "island" of salmon-colored epithelium just above the GE junction.  There was no obvious ring stricture or other abnormality.  There was no esophagitis. EG junction was easily traversed with the scope.  Stomach:  Gastric cavity was emptied and insufflated well with air.  Thorough examination of gastric mucosa including retroflexed proximal stomach esophagogastric junction demonstrated intact Nissen fundoplication, otherwise gastric mucosa appeared normal.  Pylorus was patent and easily traversed. Examination of the bulb and second portion revealed no abnormalities.  THERAPEUTIC/DIAGNOSTIC MANEUVERS PERFORMED:  The scope was withdrawn.  A 56-French Maloney dilator was passed to full insertion.  A look back revealed superficial tear of UES mucosa, otherwise no  complications related to passage of the dilator.  Subsequently, the [place] of salmon- colored epithelium was biopsied for histologic study.  The patient tolerated the procedure well and was reactive in endoscopy.  IMPRESSION: 1. Question island of salmon-colored epithelium in distal esophagus     status post biopsy preceded by a passage of a 56-French Maloney     dilator. 2. Antagonists fundoplication, otherwise normal stomach and normal D1     and D2.  RECOMMENDATIONS: 1. Continue omeprazole 40 mg orally twice daily. 2. Follow up on path. 3. Follow up appointment with [place] in office in 6 weeks to assess her     progress.     Bridgette Habermann, M.D.     RMR/MEDQ  D:  05/10/2010  T:  05/10/2010  Job:  [id number]  cc:   Percell Miller L. Luan Pulling, M.D. [phone]  Electronically Signed by Jannette Spanner M.D. on 05/17/2010 02:34:34 PM

## 2010-05-18 NOTE — Letter (Addendum)
Summary: Patient Notice, Endo Biopsy Results  [facility]  [address]   [place], [place] [address]   Phone: [phone]  Fax: [phone]       May 12, 2010   [name] [place], [place]  [id number] [birth year]    Dear [name],  I am pleased to inform you that the biopsies taken during your recent endoscopic examination did not show any evidence of cancer upon pathologic examination.  Therer was only mild inflammation.  Additional information/recommendations:  No further action is needed at this time.  Please follow-up with your primary care physician for your other healthcare needs.  Continue with the treatment plan as outlined on the day of your exam.  Please call us if you are having persistent problems or have questions about your condition that have not been fully answered at this time.  Sincerely,    R. Garfield Cornea MD, [facility] Ph: [phone]   Fax: [phone]   Appended Document: Patient Notice, Endo Biopsy Results letter mailed to pt

## 2010-06-12 LAB — POCT CARDIAC MARKERS
Myoglobin, poc: 103 ng/mL (ref 12–200)
Troponin i, poc: <0.05 ng/mL (ref 0.00–0.09)

## 2010-06-12 LAB — CK TOTAL AND CKMB (NOT AT ARMC)
CK, MB: 2.4 ng/mL (ref 0.3–4.0)
Relative Index: INVALID (ref 0.0–2.5)
Total CK: 92 U/L (ref 7–177)

## 2010-06-12 LAB — DIFFERENTIAL
Basophils Absolute: 0.1 10*3/uL (ref 0.0–0.1)
Eosinophils Absolute: 0.2 10*3/uL (ref 0.0–0.7)
Lymphs Abs: 2.2 10*3/uL (ref 0.7–4.0)
Monocytes Absolute: 0.4 10*3/uL (ref 0.1–1.0)
Neutrophils Relative %: 46 % (ref 43–77)

## 2010-06-12 LAB — BASIC METABOLIC PANEL
CO2: 25 mEq/L (ref 19–32)
Chloride: 104 mEq/L (ref 96–112)
Glucose, Bld: 102 mg/dL — ABNORMAL HIGH (ref 70–99)
Potassium: 4 mEq/L (ref 3.5–5.1)
Sodium: 135 mEq/L (ref 135–145)

## 2010-06-12 LAB — CARDIAC PANEL(CRET KIN+CKTOT+MB+TROPI)
CK, MB: 3.6 ng/mL (ref 0.3–4.0)
CK, MB: 3.8 ng/mL (ref 0.3–4.0)
Relative Index: 2.5 (ref 0.0–2.5)
Troponin I: 0.02 ng/mL (ref 0.00–0.06)

## 2010-06-12 LAB — CBC
HCT: 40.2 % (ref 36.0–46.0)
Hemoglobin: 13.9 g/dL (ref 12.0–15.0)
MCH: 30.6 pg (ref 26.0–34.0)
MCHC: 34.6 g/dL (ref 30.0–36.0)
MCV: 88.6 fL (ref 78.0–100.0)
RBC: 4.54 MIL/uL (ref 3.87–5.11)

## 2010-06-12 LAB — TROPONIN I
Troponin I: 0.02 ng/mL (ref 0.00–0.06)
Troponin I: <0.01 ng/mL (ref 0.00–0.06)

## 2010-06-21 ENCOUNTER — Ambulatory Visit: Payer: Medicare Other | Admitting: Gastroenterology

## 2010-06-30 LAB — POCT I-STAT, CHEM 8
Calcium, Ion: 1.17 mmol/L (ref 1.12–1.32)
Chloride: 107 mEq/L (ref 96–112)
Glucose, Bld: 88 mg/dL (ref 70–99)
HCT: 40 % (ref 36.0–46.0)
TCO2: 23 mmol/L (ref 0–100)

## 2010-07-06 ENCOUNTER — Telehealth: Payer: Self-pay | Admitting: Cardiology

## 2010-07-06 LAB — DIFFERENTIAL
Basophils Absolute: 0 10*3/uL (ref 0.0–0.1)
Basophils Absolute: 0 10*3/uL (ref 0.0–0.1)
Basophils Relative: 1 % (ref 0–1)
Basophils Relative: 1 % (ref 0–1)
Eosinophils Absolute: 0.1 10*3/uL (ref 0.0–0.7)
Eosinophils Absolute: 0.1 10*3/uL (ref 0.0–0.7)
Eosinophils Relative: 3 % (ref 0–5)
Lymphs Abs: 1.6 10*3/uL (ref 0.7–4.0)
Neutrophils Relative %: 55 % (ref 43–77)

## 2010-07-06 LAB — CARDIAC PANEL(CRET KIN+CKTOT+MB+TROPI)
CK, MB: 3.2 ng/mL (ref 0.3–4.0)
CK, MB: 4.1 ng/mL — ABNORMAL HIGH (ref 0.3–4.0)
CK, MB: 4.3 ng/mL — ABNORMAL HIGH (ref 0.3–4.0)
Relative Index: 3.8 — ABNORMAL HIGH (ref 0.0–2.5)
Total CK: 120 U/L (ref 7–177)
Troponin I: 0.02 ng/mL (ref 0.00–0.06)

## 2010-07-06 LAB — CBC
HCT: 41.9 % (ref 36.0–46.0)
MCV: 89.4 fL (ref 78.0–100.0)
MCV: 89.7 fL (ref 78.0–100.0)
Platelets: 177 10*3/uL (ref 150–400)
Platelets: 201 10*3/uL (ref 150–400)
RDW: 13.4 % (ref 11.5–15.5)
RDW: 13.6 % (ref 11.5–15.5)
WBC: 4.8 10*3/uL (ref 4.0–10.5)

## 2010-07-06 LAB — POCT CARDIAC MARKERS: Troponin i, poc: <0.05 ng/mL (ref 0.00–0.09)

## 2010-07-06 LAB — URINALYSIS, ROUTINE W REFLEX MICROSCOPIC
Ketones, ur: NEGATIVE mg/dL
Nitrite: NEGATIVE
Protein, ur: NEGATIVE mg/dL

## 2010-07-06 LAB — BASIC METABOLIC PANEL
BUN: 21 mg/dL (ref 6–23)
Chloride: 101 mEq/L (ref 96–112)
GFR calc non Af Amer: 42 mL/min — ABNORMAL LOW (ref >60)
Glucose, Bld: 108 mg/dL — ABNORMAL HIGH (ref 70–99)
Potassium: 3.6 mEq/L (ref 3.5–5.1)

## 2010-07-06 LAB — LIPID PANEL
HDL: 50 mg/dL (ref >39)
Triglycerides: 115 mg/dL (ref <150)

## 2010-07-06 LAB — GLUCOSE, CAPILLARY: Glucose-Capillary: 98 mg/dL (ref 70–99)

## 2010-07-06 LAB — HEPARIN LEVEL (UNFRACTIONATED): Heparin Unfractionated: <0.1 IU/mL — ABNORMAL LOW (ref 0.30–0.70)

## 2010-07-06 NOTE — Telephone Encounter (Signed)
Pt has questions regarding "fluid pill" / pls return call / tg

## 2010-07-07 ENCOUNTER — Telehealth: Payer: Self-pay | Admitting: *Deleted

## 2010-07-07 NOTE — Telephone Encounter (Signed)
Updated in  charted

## 2010-08-09 NOTE — Assessment & Plan Note (Signed)
NAME:  EURA, MICCO                 CHART#:  [id number]   DATE:                                   DOB:  27-Feb-1936   Follow up gastroesophageal reflux disease, constipation last seen  March 14, 2007.  Ms. Dunsmore has done all in all fairly well, as she  is having a significant constipation on the order of once every 2  months.  Sometimes, she gets diaphoretic trying have a bowel movement.  She is not having any rectal bleeding, and generally speaking she does  well.  She is really taking inadequate fiber.  She did have __________  left-sided diverticula, otherwise on colonoscopy is negative in May  2007, reflux symptoms well controlled on Nexium 40 grams orally daily.  She is quite stressed dealing with her demented mother.  She is  providing nearly 24-7 care for her at home, without much help from her  symptoms which is very much frustrating for this nice lady.   CURRENT MEDICATIONS:  See updated list.   ALLERGIES:  VICODIN, NUBAIN.   EXAMINATION:  GENERAL:  Today, she appears at her baseline, weight 148,  height 5 feet 1/2 inch, temperature 97.7, BP 118/80, pulse 64.  CHEST:  Lungs are clear to auscultation.  Regular rate and rhythm  without murmur, gallop, rub.  ABDOMEN:  Nondistended, positive bowel sounds, soft, nontender, no  appreciable mass or organomegaly.   ASSESSMENT:  Current symptoms well-controlled on Nexium, samples given.  She is to continue that regimen.   X-RAYS:  Occasional constipation in the setting of diverticulosis, and  she has MiraLax on hand and should use that on occasion for  constipation, would like to bolster her fiber intake, in addition to the  Fiber One cereal which she is taking.  Will give her some samples of  Benefiber 1 tablespoon daily.  Unless something comes up, I will plan to  see this nice lady back in 6 months.       Bridgette Habermann, M.D.  Electronically Signed     RMR/MEDQ  D:  07/17/2007  T:  07/17/2007  Job:  [id number]   cc:   Percell Miller L. Luan Pulling, M.D.

## 2010-08-09 NOTE — Assessment & Plan Note (Signed)
NAME:  [name], [name]                 CHART#:  [id number]   DATE:                                   DOB:  Jul 02, 1935   CHIEF COMPLAINT:  Abdominal pain, nausea, and swelling.   PHYSICIAN CO-SIGNING NOTE:  Caro Hight, MD   HISTORY OF PRESENT ILLNESS:  [name] is a 75 year old lady who has a  history of gastroesophageal reflux disease, status post fundoplication  in remote past.  She also has chronic constipation, chronic right-sided  abdominal pain, and suspected IBS.  She was last seen in April 2009.  Other than persistent constipation, she was doing very well.  She  presents today stating that over the last couple of months her right  sided abdominal pain has been more progressive.  She states this is more  constant now.  She feels like her abdomen is swelling.  She has had no  more constant nausea.  She has not vomited since her fundoplication.  She denies any heartburn.  She states she is afraid to eat because when  she does she has epigastric discomfort and early satiety.  She denies  any dysphagia or odynophagia.  Her bowel movements are still in  frequent.  She has a lot of abdominal bloating and gas, which she states  she is not able to release.  She is taking Benefiber 1-2 times daily as  needed.  If she has not had a bowel movement in 2-3 days, she takes a  Fleet enema or 1 or 2 Dulcolax.  She gets cleaned out with this.  She  denies any blood in her stools.  She denies any weight loss.  She admits  that she is under extreme amount of stress with taking care of her sick  demented mother as well as her husband who has failing health.  She  states she has 5 children who are all sick too.  She went to the  emergency department on  September 21, 2007, for abdominal pain and nausea.  She had lipase, which was 29, MED 7 normal with creatinine at 1.13.  LFTs normal with albumin of 4.1, white count 4100, hemoglobin 14.5,  platelets 221,000.   CURRENT MEDICATIONS:  1. Verelan 300 mg  daily.  2. Synthroid 75 mcg daily.  3. Xanax 0.5 mg b.i.d.  4. Lipitor 10 mg daily.  5. Nexium 40 mg daily.  6. Fish oil daily.  7. Ocuvite multivitamin daily.  8. Darvocet p.r.n.  9. Benefiber 1-2 times daily.  10.Dulcolax p.r.n.   ALLERGIES:  VICODIN and NUBAIN.   PAST MEDICAL HISTORY:  1. Chronic GERD status post Nissen fundoplication in [id number] by Dr.      Fanny Skates.  2. Last EGD was by Dr. Laural Golden in August 2006, when the patient was      hospitalized with chest pain.  EGD was normal.  3. Hypertension.  4. Nonobstructive coronary artery disease with last catheterization in      2004, stress Myoview in 2006, was negative.  5. History of benign breast nodule, status post biopsies.  6. History of partial hysterectomy at age 13 secondary to uterine      cancer.  7. Cholecystectomy.  8. 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  9. History of chronic right-sided abdominal pain felt to be either IBS      and/or musculoskeletal.  10.Last colonoscopy in May 2007, revealed a few diverticular at the      sigmoid colon, otherwise normal.  11.History of celiac artery origin high-grade stenosis.  However, SMA      and IMA were patent.  12.History of gland removed from her right neck, which was benign.   FAMILY HISTORY:  Mother had MI, diabetes, and uterine cancer.  She is 73  years old and has dementia.  Father died at age 55 due to MI.  No family  history of colorectal cancer, liver disease, or chronic GI illness.   SOCIAL HISTORY:  She is married.  She has 4 sons.  She is trying to quit  smoking just started using a nicotine patches today.  No alcohol or drug  use.   REVIEW OF SYSTEMS:  See HPI for GI.  CONSTITUTIONAL:  No weight loss.  CARDIOPULMONARY:  No chest pain, shortness of breath, or palpitations.  GENITOURINARY:  No dysuria or hematuria.   PHYSICAL EXAMINATION:  VITAL SIGNS:  Weight 145, height 5 feet and 1/2  inch,  temperature 97.8, blood pressure 128/84, and pulse 64.  GENERAL:  Pleasant well-nourished, well-developed Caucasian female in no  acute distress.  SKIN:  Warm and dry.  No jaundice.  HEENT:  Conjunctivae are pink.  Sclerae nonicteric.  Oropharyngeal  mucosa moist and pink.  No lesions, erythema or exudate.  No  lymphadenopathy or thyromegaly.  CHEST:  Lungs are clear auscultation.  CARDIAC:  Reveals regular rate and rhythm.  Normal S1 and S2.  No  murmurs, rubs or gallops.  ABDOMEN:  Positive bowel sounds.  Abdomen is soft and nondistended.  No  evidence of hernias or masses.  She has moderate tenderness in the  entire right abdomen.  Somewhat less with distraction.  She has more  epigastric tenderness than usual.  No abdominal bruits or hernias.  No  organomegaly.  RECTAL EXAM:  Reveals small external hemorrhoids, nonthrombosed, and  nonbleeding.  She does have some solid hard stool in rectal vault, but  no impaction.  Stool is brown and hem-negative.  LOWER EXTREMITIES:  No edema.   IMPRESSION:  [name] is a 75 year old lady who presents with acute  on chronic right-sided abdominal pain associated with nausea in the  setting of history of irritable bowel syndrome and constipation.  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  I  suspect that she has an exacerbation of her chronic right-sided  abdominal pain due to IBS and possibly musculoskeletal where she does  have a lot of stress on her body with caring for her 77 year old mother  who is total care.  She has heightened sensitivity and concern for  cancer because of her history of uterine cancer in the past.  Because of  the progression of her pain and her ongoing nausea.  We will go ahead  and pursue CT of abdomen and pelvis.  In addition, she does have quite a  bit of early satiety, which is a new symptom and we will likely to have  an EGD done.  Constipation is ongoing problem for her which needs  to be  managed.  She failed MiraLax in the past.   PLAN:  1. CT of the abdomen and pelvis with IV and oral contrast.  2. Continue Nexium 40 mg daily #20 samples  provided.  3. Bowel regimen should consist of Benefiber daily, Colace 100 mg      daily and if she has not had bowel movement in 2-3 days, she should      take the Dulcolax 1-2 tablets daily or enema.  4. Possible EGD after CT has been done.       Neil Crouch, P.A.  Electronically Signed     Caro Hight, M.D.  Electronically Signed    LL/MEDQ  D:  09/24/2007  T:  09/24/2007  Job:  [id number]   cc:   Percell Miller L. Luan Pulling, M.D.

## 2010-08-09 NOTE — Assessment & Plan Note (Signed)
[facility] OFFICE NOTE   [name], [name]                       MRN:          [id number]  DATE:07/29/2008                            DOB:          10-Apr-1935    CARDIOLOGIST:  Cristopher Estimable. Lattie Haw, MD, [facility]   PRIMARY CARE PHYSICIAN:  [place] Luan Pulling, M.D.   REASON FOR VISIT:  Post-hospitalization followup.   HISTORY OF PRESENT ILLNESS:  [name] is a 75 year old female patient  with a history nonobstructive coronary artery disease by catheterization  2002, 2004, and non ischemic Myoview study in August 2006 who presented  to [facility] recently with chest pain.  She ruled out for  myocardial infarction by enzymes.  She had an inpatient echocardiogram  that demonstrated normal LV function with no wall motion abnormalities  and mild-to-moderate mitral regurgitation.  She was felt to have  musculoskeletal type chest pain versus chest pain from a  gastrointestinal etiology.  She apparently has significant stomach  problems and is apparently going to [facility] in the next 1-2  weeks for enlarged bile duct.  She did have some bradycardia noted while  in the hospital.  Dr. Verl Blalock decreased her Verelan from 300-240 mg a day.  In the office, she notes she is doing well.  She notes mainly some chest  discomfort with trying to lift her mother who is significantly ill.  She  denies any chest pressure or tightness.  Denies significant shortness of  breath.  She denies any exertional chest pain.  She denies orthopnea,  PND, or pedal edema.  She denies syncope.  She continues to be under  quite a bit of stress with her mother who keeps declining in health.  She thinks that she is likely going to pass away in the next 6-12  months.   CURRENT MEDICATIONS:  1. Lipitor 10 mg at bedtime.  2. Verelan 240 mg at bedtime.  3. Xanax 0.5 mg 3 times day.  4. Synthroid 0.075 mg daily.  5. Aspirin 81 mg  daily.  6. Vitamin D plus calcium.  7. Prilosec 20 mg b.i.d.  8. Nicotine patch.  9. Benefiber.  10.Tylenol p.r.n.   PHYSICAL EXAMINATION:  GENERAL:  She is a well nourished and well  developed female, in no distress.  VITAL SIGNS:  Blood pressure is 130/80, pulse 68, and weight 145 pounds.  HEENT:  Normal.  NECK:  Without JVD.  CARDIAC:  S1 and S2.  Regular rate and rhythm.  LUNGS:  Clear to auscultation bilaterally.  ABDOMEN:  Soft and nontender.  EXTREMITIES:  No edema.  NEUROLOGIC:  She is alert and x oriented x3.  Cranial nerves II-XII are  grossly intact.   ASSESSMENT AND PLAN:  1. Chest pain.  This is noncardiac in etiology.  As noted above, she      had been evaluated in past with cardiac catheterization in 2002 and      2004 and she had nonobstructive coronary artery disease.  She had      50% ostial diagonal 1, 30% proximal, and 30%  mid right coronary  artery lesion in 2004.  She had a nonischemic Myoview study in      August 2006.  Her symptoms are atypical for ischemia.  They are      most consistent with a musculoskeletal etiology.  She is also under      a great deal of stress with her mother.  She also has some history      of gastroesophageal reflux disease and is in need of bile duct      procedure at [facility] in the next 1-2 weeks.  At this      point, she does not appear to need any further cardiac workup.      Certainly if her symptoms should change, we can consider stress      testing at that time.  2. Hypertension.  This seems to be well controlled with the change in      Verelan.  We will continue with Verelan 240 mg at bedtime.  3. Dyslipidemia.  She continues on Lipitor and is followed by Dr.      Luan Pulling.  Her lipid panel in the hospital demonstrated optimal      level with a total cholesterol of 146, triglycerides 115, HDL 50      and LDL of 73.  4. Chronic obstructive pulmonary disease.  She is trying to quit      smoking.    DISPOSITION:  The patient will be brought back in follow up with Dr.  Lattie Haw in 3 months or sooner p.r.n.      Richardson Dopp, PA-C  Electronically Signed      Cristopher Estimable. Lattie Haw, MD, [facility]  Electronically Signed   SW/MedQ  DD: 07/29/2008  DT: 07/30/2008  Job #: [id number]   cc:   Percell Miller L. Luan Pulling, M.D.

## 2010-08-09 NOTE — Assessment & Plan Note (Signed)
NAMEMarland [name]  MICHAELE, DICE                 CHART#:  [id number]   DATE:  03/02/2008                       DOB:  Dec 29, 1935   CHIEF COMPLAINT:  Lot of burping.   SUBJECTIVE:  The patient is here for a followup visit.  I saw her last  on 09/24/2007.  She complained of acute-on-chronic right-sided abdominal  pain with nausea, IBS, and constipation.  She underwent a CT of the  abdomen and pelvis primarily because of abdominal pain and history of  uterine cancer.  She was noted to have mild intrahepatic biliary ductal  dilatation likely from a prior cholecystectomy, sigmoid diverticulosis,  5-mm subpleural nodule in the left lower lobe of the lung, which have  been stable from 2006.  Nothing explained her ongoing symptoms.  She  therefore came back and saw Dr. Gala Romney in October 2009 and he scheduled  EGD.  On EGD, she had distal esophageal erosion consistent with erosive  reflux esophagitis, intact Nissen fundoplication.  He placed her back on  her Nexium 40 mg daily.  She states that she has no heartburn symptoms.  We had actually switched her from Nexium to Protonix.  She felt like  Nexium was not helping.  She had already failed Prilosec as well.  On  Protonix 40 mg daily, she has complete control of her reflux, but she  does complain of lot of belching.  She complains of abdominal bloating  and discomfort.  She feels like her abdomen is protruding.  Her weight  has been stable.  Her bowels are regular.  No blood in the stool or  melena.  Denies any nausea, vomiting, dysphagia, or odynophagia.  She  states that she is getting a cold and wants some antibiotics.  She feels  congested in her chest.  She complains that her kidneys are hurting.  She states she has had a urinalysis recently with Dr. Luan Pulling and it was  normal.   CURRENT MEDICATIONS:  See updated list.   ALLERGIES:  Vicodin and Nubain.   PHYSICAL EXAMINATION:  VITAL SIGNS:  Weight 146.5, temp 98, blood  pressure 144/80, and pulse  64.  GENERAL:  A pleasant, anxious Caucasian female in no acute distress.  SKIN:  Warm and dry.  No jaundice.  HEENT:  Sclerae nonicteric.  Oropharyngeal mucosa moist and pink.  CHEST:  Lungs are clear to auscultation.  CARDIAC:  Regular rate and rhythm.  Normal S1 and S2.  No murmurs, rubs,  or gallops.  ABDOMEN:  Positive bowel sounds.  Abdomen is soft, obese, and nontender.  No organomegaly or masses.  No abdominal bruits or hernias.  No evidence  of ascites.  LOWER EXTREMITIES:  No edema.   IMPRESSION:  The patient is a 75 year old lady with chronic abdominal  pain, irritable bowel syndrome, constipation, gastroesophageal reflux  disease, status post Nissen fundoplication who presents with basically  complaints of excessive belching although she tells me it does not  happen daily and concern about abdominal bloating and swelling.  She  complains of kidney pain with pain in both CVA  regions.  No  significant finding on exam.  She is somewhat anxious person and has  concern about malignancy.  She notably is very stressed out about taking  care of her 56 year-old mother.  I suspect that her reflux is  reasonably  controlled.  She no longer is having heartburn issues.  Abdominal  bloating may be more from her irritable bowel syndrome.  She really does  not have any abdominal pain with it.  We will check abdominal ultrasound  just to make sure she has had no significant changes since her last CT.  With regards to her complaints of upper respiratory symptoms, I have  asked her to follow up with Dr. Luan Pulling for this.   PLAN:  1. Protonix 40 mg daily, continue.  2. She may use Mylanta p.r.n.  3. Abdominal ultrasound.  4. CMET and CBC.  5. Align 1 daily, #30 samples provided.       Neil Crouch, P.A.  Electronically Signed     R. Garfield Cornea, M.D.  Electronically Signed    LL/MEDQ  D:  03/02/2008  T:  03/02/2008  Job:  [id number]   cc:   Percell Miller L. Luan Pulling, M.D.

## 2010-08-09 NOTE — H&P (Signed)
NAME:  [name], [name]                ACCOUNT NO.:  [id number]   MEDICAL RECORD NO.:  [id number]          PATIENT TYPE:  AMB   LOCATION:  DAY                           FACILITY:  [facility]   PHYSICIAN:  [name], M.D. DATE OF BIRTH:  [birth year]   DATE OF ADMISSION:  DATE OF DISCHARGE:  LH                              HISTORY & PHYSICAL   CHIEF COMPLAINT:  Abdominal pain, history of gastroesophageal reflux  disease.   HISTORY OF PRESENT ILLNESS:  Ms. Jessica Rusin. [name] is a 75 year old lady  with long-standing gastroesophageal reflux disease status post  laparoscopic Nissen fundoplication by Dr. Fanny [name] several years  ago.  We have been seeing her recently for abdominal pain and nausea.  She was last seen here on September 24, 2007.  She has had some chronic right-  sided abdominal pain, suspected to have irritable bowel syndrome.  She  has intermittent constipation.  She is having worsening of nausea  recently.  She had been taking Nexium for reflux, but has run out.  She  has vague dysphagia from time to time and has retroxiphoid burning on a  regular basis these days.  She also has intermittent diffuse abdominal  pain.  She has not had any melena or rectal bleeding.  Her last  colonoscopy was in May 2007 by Dr. Melony [name].  She is found to have  diverticulosis.  There is no family history of colorectal neoplasia.  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  She  does not have postprandial abdominal pain, has not lost any weight.  She  has a distant history of choledocholithiasis status post sphincterotomy  at [facility].  Apparently, she also had ampullary stenosis.  Last EGD was in  2006.  This was a normal study.  She is not taking any nonsteroidal  agents.  She is self admittedly stressed out, taking care of her 67-year-  old mom 24/7 as she gets no help from her family.  CT of the abdomen,  September 26, 2007, stable left lower lung nodule (stable going back to 2006)  mild  intrahepatic biliary dilation noted on CT.   Per her report, past medical history is significant for chronic GERD  status post Nissen fundoplication in [id number], hypertension, nonobstructive  coronary artery disease, last cardiac catheterization in 2004, stress  Myoview negative in 2006, history of benign breast nodule, history of  partial hysterectomy, status post cholecystectomy, status post ERCP  choledocholithiasis/papillary stenosis, history of chronic abdominal  pain, IBS-C, colonoscopy, diverticulosis in May 2007, and celiac  stenosis.   CURRENT MEDICATIONS:  1. Verelan 300 mg daily.  2. Synthroid 75 mcg daily.  3. Xanax 0.5 mg p.Jessican.  4. Lipitor 10 mg daily.  5. Nexium 40 mg, currently not taking.  6. Fish oil daily.  7. Ocuvite daily.  8. Darvocet p.Jessican.  9. __________ daily.  10.Dulcolax laxative p.Jessican.   ALLERGIES:  VICODIN and NUBAIN.   FAMILY HISTORY:  Mother is elderly/still living.  The patient provides  essentially all of her care.  No family history of  colorectal neoplasia,  liver disease, other chronic gastrointestinal illness.  Mother had  uterine cancer.   SOCIAL HISTORY:  The patient is married, has 4 sons.  She continues to  smoke, but no alcohol or illicit drug use.   REVIEW OF SYSTEMS:  No recent chest pain, dyspnea on exertion, no fever,  chills, no change in weight.   PHYSICAL EXAMINATION:  GENERAL:  Pleasant 74 year old lady resting  comfortably.  VITAL SIGNS:  Weight 146, height 5 feet and 1/2 inch, temperature 97.9,  BP 160/90, pulse 72.  SKIN:  Warm and dry.  No scleral icterus.  Conjunctivae are pink.  CHEST:  Lungs are clear to auscultation.  CARDIAC:  Regular rate and rhythm without murmur, gallop, or rub.  ABDOMEN:  Nondistended, positive bowel sounds, soft with some epigastric  tenderness.  No appreciable mass or hepatosplenomegaly.  EXTREMITIES:  No edema.   IMPRESSION:  [name] is a 75 year old lady with chronic  abdominal  pain,  irritable bowel syndrome, and constipation.  She has  iliac artery stenosis, but has patent IMA and SMA clinically.  Symptoms  were atypical for mesenteric ischemia.  It sounds like she is having  worsening of gastroesophageal reflux disease symptoms status post  laparoscopic Nissen fundoplication.  CT findings recently otherwise  reassuring.  LFTs notably normal on September 21, 2007.  I get a feeling that  [name] is over extended pretty much the 24/7 caregiver for aged  mother which she is taking is a toll on her physically and  psychologically somewhat compounding the clinical picture.   RECOMMENDATIONS:  Offered [name] a diagnostic EGD.  Risks,  benefits, alternatives, and limitations have been reviewed, questions  answered, she is agreeable.  We will make further recommendations in the  very near future once endoscopic evaluation has taken place.       [name], M.D.  Electronically Signed     RMR/MEDQ  D:  01/14/2008  T:  01/15/2008  Job:  [id number]   cc:   [name], M.D.  Fax: [phone]

## 2010-08-09 NOTE — Assessment & Plan Note (Signed)
NAMEMarland [name]  JAQUAY, SLIMAK                 CHART#:  [id number]   DATE:  08/30/2006                       DOB:  Jan 19, 1936   PRESENTING COMPLAINT:  Follow up her right-sided abdominal pain.   SUBJECTIVE:  Deilyn is a 75 year old Caucasian female patient of Dr.  Luan Pulling who is here for a scheduled visit.  She was last seen on  May 10, 2006.  She has had recurrent/chronic right mid abdominal  pain.  There was a question of significant atherosclerosis with her  arteries.  CT of March 17, 2006, she had stenosis in origin of celiac  trunk, but SMA and IMA were patent.  We decided to monitor her.   She states she has abdominal pain every day.  She points to an area  right to umbilicus.  Pain is worse when she does physical work.  She  states she has to help in care of her parent and her husband, both of  whom are sick.  She says it is a mild pain, never more than a score of  2.  This pain is not made worse with meals.  She denies diarrhea.  She  is still smoking but trying hard to quit.  She uses Dulcolax  occasionally.  No history of melena or rectal bleeding.   MEDICATIONS:  1. She is on Verelan 300 mg daily.  2. Synthroid 75 mcg daily.  3. Xanax 0.5 mg t.i.d.  4. Lipitor 10 mg daily.  5. Nexium 40 mg q.a.m., and she would like to get some samples.   OBJECTIVE:  VITAL SIGNS:  Weight 147.5 pounds.  She is 5 feet and 1/2  inch tall.  Pulse 64, blood pressure 124/80, temp 97.3.  HEENT:  Conjunctivae is pink.  Sclerae is nonicteric.  Oropharyngeal  mucosa is normal.  No neck masses are noted.  ABDOMEN:  Symmetrical.  Bowel sounds are normal.  No bruits noted.  Her  abdomen is soft.  She has mild tenderness in right mid abdomen.  No  organomegaly or masses.   ASSESSMENT:  Musette's right mid abdominal pain is very suspicious for  musculoskeletal pain.  She has what appears to be stenosis to celiac  trunk, but other two arteries are patent.  Her symptoms are not  suggestive of abdominal  angina.  We will therefore continue to monitor  her.   PLAN:  1. The patient advised to make every effort to quit cigarette smoking.      She has discussed this with Dr. Luan Pulling and may go back using      patches.  2. Nexium samples given.   She will return for OV in 6 months.       Hildred Laser, M.D.  Electronically Signed     NR/MEDQ  D:  08/30/2006  T:  08/31/2006  Job:  [id number]   cc:   Percell Miller L. Luan Pulling, M.D.

## 2010-08-09 NOTE — Group Therapy Note (Signed)
NAME:  FORTUNE, SOUTHARDS                ACCOUNT NO.:  [id number]   MEDICAL RECORD NO.:  [id number]          PATIENT TYPE:  OBV   LOCATION:  A311                          FACILITY:  [facility]   PHYSICIAN:  Edward L. Luan Pulling, M.D.DATE OF BIRTH:  25-Jul-1935   DATE OF PROCEDURE:  DATE OF DISCHARGE:  07/16/2008                                 PROGRESS NOTE   Ms. Kelm is overall better.  She has no chest pain now.  She feels  like some of this is musculoskeletal and I agree.  She wants to try to  stop smoking.  She has been using a nicotine patch, which I think is  appropriate.   Her physical examination shows that she is awake and alert.  Her chest  is clear.  Her heart is regular.   Her echocardiogram yesterday was essentially normal with normal left  ventricular ejection fraction.   Assessment and plan is that she is improved.  Plan is for discharge  today.  Please see discharge summary for details.      Edward L. Luan Pulling, M.D.  Electronically Signed     ELH/MEDQ  D:  07/16/2008  T:  07/17/2008  Job:  [id number]

## 2010-08-09 NOTE — H&P (Signed)
NAME:  [name], [name]                ACCOUNT NO.:  [id number]   MEDICAL RECORD NO.:  [id number]          PATIENT TYPE:  OBV   LOCATION:  A311                          FACILITY:  [facility]   PHYSICIAN:  Edward L. Luan Pulling, M.D.DATE OF BIRTH:  1935-06-17   DATE OF ADMISSION:  07/14/2008  DATE OF DISCHARGE:  LH                              HISTORY & PHYSICAL   REASON FOR ADMISSION:  Chest pain.   HISTORY:  [name] is a 75 year old who has a history of cardiac  catheterization done in 2002 and 2004, which showed insignificant  coronary artery disease.  She came to the emergency room, however, with  chest discomfort which was on the left side and felt like some sort of a  cramp.  She told the cardiologist that she did not have any shortness of  breath or diaphoresis, but she told me and the emergency room physician  that she did.  At any rate, she came to the emergency room, she was  evaluated there, did not have any definitive findings, and was brought  in for rule out MI protocol.  She took nitroglycerin in the emergency  room, which may have helped some.   PAST MEDICAL HISTORY:  Positive for,  1. COPD.  2. Hypertension.  3. Hyperlipidemia.  4. Hypothyroidism.  5. Asthma in childhood.  6. Previous total abdominal hysterectomy.  7. She also has GERD, which has been a significant problem.  8. She has had a Nissen fundoplication.  9. She does have a history of depression and anxiety.   PAST SURGICAL HISTORY:  1. Hysterectomy.  2. Lumpectomy.  3. Cholecystectomy.  4. Cataract surgery.   MEDICATIONS:  1. Lipitor 10 mg at bedtime.  2. Verelan 300 mg at bedtime.  3. Xanax 0.5 mg t.i.d.  4. Synthroid 75 mcg daily.  5. Aspirin 81 mg daily.  6. Vitamin D with calcium daily.  7. Prilosec 20 mg b.i.d.  8. Nicotine patch.  9. Benefiber.   SOCIAL HISTORY:  She has about a 25-pack year smoking history.  She is  working on trying to stop.  She has been taking care of her elderly  mother  and has a lot of stress from that.   FAMILY HISTORY:  Her mother has Alzheimer disease, atrial fibrillation,  and a history of an MI.  Her father died in his 10s of an MI.  She has a  brother who died in his 49s of an MI.  She has children who had cardiac  disease as well.   REVIEW OF SYSTEMS:  Except as mentioned is negative.   PHYSICAL EXAMINATION:  GENERAL:  A well-developed, well-nourished  Caucasian female who is in no acute distress now.  VITAL SIGNS:  Blood pressure 130/76, pulse 60, respirations 20, she is  afebrile.  HEENT:  Essentially unremarkable.  Nose and throat are clear.  Mucous  membranes are moist.  NECK:  Supple without masses.  CHEST:  Clear without wheezes, rales, or rhonchi.  HEART:  Regular without murmur, gallop, or rub.  ABDOMEN:  Soft.  No masses felt.  EXTREMITIES:  No edema.  CNS:  Grossly intact.   Chest x-ray shows COPD.  Cardiac markers thus far are negative.   ASSESSMENT:  She has chest pain, which is certainly atypical and  considering that she has had multiple evaluations, probably does not  represent cardiac disease; although, she does have a very strong family  history and multiple risk factors.  Thus far, her enzymes and EKG are  negative for infarction.  I am going to ask for a cardiology  consultation and continue with her regular treatments and I again  strongly suggested to her that she stop smoking.      Edward L. Luan Pulling, M.D.  Electronically Signed     ELH/MEDQ  D:  07/14/2008  T:  07/15/2008  Job:  [id number]

## 2010-08-09 NOTE — Group Therapy Note (Signed)
NAME:  [name], [name]                ACCOUNT NO.:  [id number]   MEDICAL RECORD NO.:  [id number]          PATIENT TYPE:  OBV   LOCATION:  A311                          FACILITY:  [facility]   PHYSICIAN:  Edward L. Luan Pulling, M.D.DATE OF BIRTH:  September 04, 1935   DATE OF PROCEDURE:  DATE OF DISCHARGE:                                 PROGRESS NOTE   [name] has continued to have some left chest pain and left shoulder  pain.  Thus far her cardiac panel show elevated CK very slightly, but  normal with MB fraction being slightly up also.   PHYSICAL EXAMINATION:  Her temperature is 98.1, pulse 49, respirations  20, blood pressure 122/82, O2 sat is 98%.  Her chest shows some rhonchi  bilaterally.  She complains of being tired.   ASSESSMENT:  She has got mildly abnormal cardiac panel.  We are going to  get an echocardiogram.  Continue with all her other treatment and  follow.      Edward L. Luan Pulling, M.D.  Electronically Signed     ELH/MEDQ  D:  07/15/2008  T:  07/16/2008  Job:  [id number]

## 2010-08-09 NOTE — Consult Note (Signed)
NAME:  [name]                ACCOUNT NO.:  [id number]   MEDICAL RECORD NO.:  [id number]          PATIENT TYPE:  OBV   LOCATION:  A311                          FACILITY:  [facility]   PHYSICIAN:  Cristopher Estimable. Lattie Haw, MD, FACCDATE OF BIRTH:  06-06-1935   DATE OF CONSULTATION:  07/14/2008  DATE OF DISCHARGE:                                 CONSULTATION   CARDIOLOGIST:  Cristopher Estimable. Lattie Haw, M.D., [facility].   PRIMARY CARE PHYSICIAN:  Edward L. Luan Pulling, M.D.   REASON FOR CONSULTATION:  Chest pain.   HISTORY OF PRESENT ILLNESS:  [name] is a 75 year old female patient  with a history of nonobstructive coronary artery disease at  catheterization in 2002 and 2004 with episodic atypical chest pain for  several years, who presents with a recurrence of her symptoms for the  last 2 days.  Her chest pain was left sided and described as a cramp.  No associated shortness of breath, nausea, diaphoresis or radiating  symptoms.  She denies any relation to exertion.  She feels as though she  needs to belch.  She is pending an appointment at [facility] secondary  to dilated biliary ducts noted on MRI of her abdomen in December 2009.  She had an upper endoscopy in October 2009 that demonstrated erosive  reflux esophagitis.  She also complains of epigastric pain.  She denies  chest pain related to her meals.  She denies any relief with  nitroglycerin.  She is still having some pain some 24 hours after the  onset of her symptoms.  Thus far her cardiac markers have been negative.  Her electrocardiogram demonstrates sinus bradycardia with a heart rate  of 52, normal axis, no acute changes.   PAST MEDICAL HISTORY:  1. Nonobstructive coronary disease by catheterization in 2002 and      2004.      a.     In June 2004 she had a 50% ostial diagonal #1 and a 30%       proximal and a 30% mid RCA lesion.      b.     Myoview in August 2006 demonstrating EF 67%, no ischemia.  2. Hypertension.  3.  Hyperlipidemia.  4. Hypothyroidism.  5. Asthma since childhood.  6. COPD.  7. History of uterine carcinoma status post total abdominal      hysterectomy.  8. GERD.  9. Status post Nissen fundoplication in 99991111.  [id number] of bilateral lumpectomies.  11.Status post cholecystectomy.  12.Status post cataract surgery.  13.Depression/anxiety.   MEDICATIONS AT HOME:  1. Lipitor 10 mg bedtime.  2. Verelan 300 mg bedtime.  3. Xanax 0.5 mg 3 times daily.  4. Synthroid 0.075 mg daily.  5. Aspirin 81 mg daily.  6. Vitamin D plus calcium daily.  7. Prilosec 20 mg b.i.d.  8. Nicotine patch.  9. Benefiber daily.   ALLERGIES:  PAXIL and EFFEXOR.  She actually states she is allergic to  all ANTIDEPRESSANTS.   SOCIAL HISTORY:  She lives in [place] with her husband.  She works  for [employer] on [employer] and exclusively takes care  of her mother who is  4 years of age and has some Alzheimer dementia.  This is creating great  amounts of stress for her.  She has 4 living sons, 1 son is deceased.  She has a 25+ pack-year history of smoking and is trying to quit.  She  denies alcohol or drug abuse.   FAMILY HISTORY:  As outlined above.  Her mother had myocardial  infarction in her 48s.  Her father died of a myocardial infarction in  his late 54s and she had a brother that died of a myocardial infarction  in his mid 50s.   REVIEW OF SYSTEMS:  Please see HPI.  Denies fevers but has had chills.  Denies headache, sore throat, dysuria, hematuria, bright red blood per  rectum or melena, dysphagia, but she does note some belching and water  brash symptoms.  She denies orthopnea or PND or pedal edema.  She notes  palpitations from time to time.  She has had these for many years.  She  actually calls these atrial fibrillation but has never had atrial  fibrillation documented.  She denies syncope, near-syncope or cough.  All other systems reviewed and negative.   PHYSICAL EXAMINATION:  She is a  well-nourished, well-developed female in  no acute distress.  Blood pressure is 137/78, pulse 59, respirations 20,  temperature 97.8, oxygen saturation 97% on room air.  HEENT:  Normal.  NECK:  Without JVD.  LYMPH:  Without lymphadenopathy.  ENDOCRINE:  Without thyromegaly.  CARDIAC:  Normal S1, S2.  Regular rate and rhythm without murmur.  LUNGS:  Clear to auscultation bilaterally without wheezing, rhonchi or  rales.  ABDOMEN:  Soft, normoactive bowel sounds.  No organomegaly.  Positive  epigastric tenderness with palpation is noted.  EXTREMITIES:  Without clubbing, cyanosis or edema.  SKIN:  Warm and dry.  MUSCULOSKELETAL:  Without joint deformity.  NEUROLOGIC:  She is alert and oriented x3.  Cranial nerves II-XII are  grossly intact.  VASCULAR EXAM:  Without carotid bruits bilaterally.   Chest x-ray:  COPD with minimal right basilar atelectasis.  EKG as  outlined above.  Hemoglobin 14.4, potassium 3.6, creatinine 1.26, BNP  less than 30.  Cardiac markers negative x2.   ASSESSMENT:  1. Chest pain in a 75 year old female with a history of nonobstructive      coronary disease by cardiac catheterization in 2002 as well as 2004      with a nonischemic Myoview study in 2006 and overall preserved left      ventricular function.  Her symptoms have been ongoing for about 24      hours and are similar to her prior episodes of chest pain in the      past.  Her cardiac markers are negative thus far and her EKG is      nonacute.  2. Gastroesophageal reflux disease/erosive esophagitis.  3. Tobacco abuse.  4. Family history of coronary artery disease.  5. Hypertension.  6. Hyperlipidemia.  7. Asthma/chronic obstructive pulmonary disease.  8. Depression/anxiety.  9. Status post cholecystectomy.   RECOMMENDATIONS:  The patient was also interviewed and examined by Dr.  Jacqulyn Ducking.  Prior records have been reviewed.  She presents with a  recurrence of chest pain that she has had  chronically for years.  Her  symptoms are somewhat suggestive of worsening GI etiology.  As noted  above, she has an apparent consult pending at [facility] for dilated  biliary ducts noted on recent MRI.  She has had insignificant coronary  disease in the past.  At this point we will discontinue full-dose  heparin.  Serial EKGs and cardiac markers will be obtained.  If her  cardiac markers remain negative, she can be discharged from a cardiac  standpoint in the morning with close followup in the office.  We can  decide on further testing if warranted when she returns in followup.  She will be placed on twice daily proton pump inhibitor therapy.  Consideration can be given towards consulting gastroenterology during  this admission, but we will leave this up to Dr. Luan Pulling.   Thank you very much for the consultation. We will be glad to follow the  patient throughout the remainder of this admission.      Richardson Dopp, PA-C      Cristopher Estimable. Lattie Haw, MD, [facility]  Electronically Signed    SW/MEDQ  D:  07/15/2008  T:  07/15/2008  Job:  [id number]   cc:   Percell Miller L. Luan Pulling, M.D.  Fax: [phone]

## 2010-08-09 NOTE — Op Note (Signed)
NAME:  [name], [name]                ACCOUNT NO.:  [id number]   MEDICAL RECORD NO.:  [id number]          PATIENT TYPE:  AMB   LOCATION:  DAY                           FACILITY:  [facility]   PHYSICIAN:  R. Garfield Cornea, M.D. DATE OF BIRTH:  13-Apr-1935   DATE OF PROCEDURE:  01/16/2008  DATE OF DISCHARGE:                               OPERATIVE REPORT   PROCEDURE:  Diagnostic esophagogastroduodenoscopy.   INDICATIONS FOR PROCEDURE:  A 75 year old lady with chronic abdominal  pain and worsening symptoms of gastroesophageal reflux disease, off  Nexium which she had been taking.  She is status post laparoscopic  Nissen fundoplication for gastroesophageal reflux disease.  She is  having no dysphagia.  EGD is now being done.  Risks, benefits,  alternatives, and limitations have been reviewed.  Questions were  answered.  She is agreeable.  Please see the documentation in the  medical record.   PROCEDURE NOTE:  O2 saturation, blood pressure, pulse, and respirations  were monitored throughout the entirety of the procedure.   CONSCIOUS SEDATION:  Versed 5 mg IV and Demerol 100 mg IV in divided  doses.  Phenergan 12.5 mg diluted slow IV push to augment conscious  sedation.  Cetacaine spray for topical pharyngeal anesthesia.   FINDINGS:  Examination of the tubular esophagus revealed 1-cm distal  esophageal linear erosions straddling the EG junction.  There was no  Barrett esophagus, no stricture.  EG junction was traversed with  essentially no difficulty.   Stomach:  Gastric cavity was emptied, and insufflated well with air.  Thorough examination of the gastric mucosa including retroflexion of the  proximal stomach, esophagogastric junction demonstrated an appropriately  snug, intact Nissen fundoplication.  Pylorus was patent and easily  traversed.  Examination of the bulb and second portion revealed no  abnormalities.  The gastric mucosa also appeared normal aside from  changes consistent with  fundoplication.  The patient tolerated the  procedure well and was reactive in Endoscopy.   IMPRESSION:  Distal esophageal erosions consistent with erosive reflux  esophagitis, otherwise unremarkable esophageal mucosa.  Intact Nissen  fundoplication.  Otherwise normal stomach, D1 and D2.   [name] is having ongoing gastroesophageal reflux disease in spite  of a bolstered antireflux barrier via the fundoplication.  She was doing  well on Nexium and will get back on Nexium 40 mg pill once daily 30  minutes  before breakfast.  I will arrange for her to get by this office for  samples, and I will write her a prescription.  Antireflux measures/diet  emphasized.   PLAN:  To see her back in the office in 6 weeks to see how she is doing.      Bridgette Habermann, M.D.  Electronically Signed     RMR/MEDQ  D:  01/16/2008  T:  01/17/2008  Job:  [id number]   cc:   Percell Miller L. Luan Pulling, M.D.  Fax: [phone]

## 2010-08-09 NOTE — Assessment & Plan Note (Signed)
NAMEMarland Kitchen  AIKA, DEPIES                 CHART#:  [id number]   DATE:  03/14/2007                       DOB:  1935/10/16   Last seen by Dr. Melony Overly August 30, 2006.  Followup abdominal pain,  constipation, and gastroesophageal reflux disease.   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  No other significant abnormalities on CT scan.  History of dysphagia, underwent EGD with some empiric esophageal  dilation in 1995.  Left-sided colitis seen on colonoscopy 1995 where  clostridium difficile was found.  History of papillary stenosis status  post endoscopic sphincterotomy previously.  Status post cholecystectomy.  Chronic constipation.  Last colonoscopy Jul 28, 2005 demonstrated  diffusely more diverticula present, (Dr. Legrand Como).  The patient returns  today.  She is doing well aside from right-sided abdominal pain, which  has been chronic for some time.  She is constipated.  She perceives  right-sided abdominal pain particularly related to constipation.  Approximately once monthly with fairly good results when she moves her  bowels, the right-sided pain subsides.  She has not had any melena or  rectal bleeding.  Reflux symptoms are well controlled with Nexium 40 mg  orally daily.  If she misses a dose, she has recurrent symptoms.   MEDICATIONS:  See updated list.   ALLERGIES:  Vicodin, Nubain.   EXAM:  She looks well.  Weight is stable at 148 pounds, height 5 feet 1-1/2 inches.  Temperature  98, blood pressure 124/80.  Pulse 64.  SKIN:  Warm and dry.  HEENT:  No scleral icterus.  CHEST AND LUNGS:  Clear to auscultation.  CARDIOVASCULAR:  Regular rate and rhythm without murmur, rub, or gallop.  ABDOMEN:  Nondistended.  Positive bowel sounds.  Multiple surgical  scars.  Soft and nontender.  No appreciable mass or hepatosplenomegaly.  EXTREMITIES:  No edema.   ASSESSMENT:  1. Right-sided abdominal pain temporally related to constipation.   She      does have some celiac stenosis, but felt not to be critical, in      fact symptoms not consistent with mesenteric ischemia.  2. Gastroesophageal reflux disease, symptoms quiescent on acid      suppression therapy.  3. Diverticulosis on recent colonoscopy.   RECOMMENDATIONS:  1. Continue Nexium 40 mg orally daily.  2. Try to get her bowels going better, at least once every other day.      I recommended she try some MiraLax 17 g orally at bedtime.  On a      nightly basis, p.r.n., to facilitate at least 3 bowel movements      weekly.  Continue Nexium 40 mg orally daily.  Samples given.      Unless something comes up, plan to see this nice lady back in 4      months.       Bridgette Habermann, M.D.  Electronically Signed     RMR/MEDQ  D:  03/14/2007  T:  03/15/2007  Job:  [id number]   cc:   Percell Miller L. Luan Pulling, M.D.

## 2010-08-09 NOTE — Discharge Summary (Signed)
NAME:  NAVI, POLIS                ACCOUNT NO.:  [id number]   MEDICAL RECORD NO.:  [id number]          PATIENT TYPE:  OBV   LOCATION:  A311                          FACILITY:  [facility]   PHYSICIAN:  Edward L. Luan Pulling, M.D.DATE OF BIRTH:  Sep 07, 1935   DATE OF ADMISSION:  07/14/2008  DATE OF DISCHARGE:  04/22/2010LH                               DISCHARGE SUMMARY   FINAL DISCHARGE DIAGNOSES:  1. Chest discomfort, myocardial infarction ruled out.  2. Hypertension.  3. Chronic obstructive pulmonary disease.  4. Hypothyroidism.  5. Gastroesophageal reflux disease.  6. Hyperlipidemia.  7. Chronic constipation.  8. Anxiety.   HISTORY:  Ms. Caruthers is a 75 year old who came to the emergency room  because of chest pain.  She had some associated shortness of breath and  she does have some COPD, so it was difficult to be certain about that.  Her chest pain was fairly atypical, but she has multiple risk factors  including smoking, family history, hypertension, hyperlipidemia.  She  was brought in for rule out MI protocol.   PHYSICAL EXAMINATION:  GENERAL:  She appeared to be in moderate  distress.  She had no hemodynamic instability.  CHEST:  Fairly clear.  HEART:  Regular.  ABDOMEN:  Soft.  EXTREMITIES:  No edema.   Assessment at the time of admission was that she had chest discomfort  that was at least somewhat atypical.  She had rule out MI protocol, had  Cardiology consultation, had an echocardiogram.  It showed no evidence  of an acute MI.  No evidence of any other acute problems except for  probably osteoarthritic.   DISCHARGE MEDICATIONS:  She was discharged home on July 16, 2008 to  continue with her previous medications which include:  1. Lipitor 10 mg at bedtime.  2. Verelan 300 mg at bedtime.  3. Xanax 0.5 mg t.i.d.  4. Synthroid 75 mcg daily.  5. Aspirin 81 mg daily.  6. Vitamin D with calcium daily.  7. Prilosec 20 mg b.i.d.  8. Nicotine patch 14 mcg daily.  9.  Benefiber 1 packet daily.  10.She is going to add Tylenol as needed for chest discomfort 650 mg      q.4 h.      Edward L. Luan Pulling, M.D.  Electronically Signed     ELH/MEDQ  D:  07/16/2008  T:  07/16/2008  Job:  [id number]

## 2010-08-12 NOTE — Discharge Summary (Signed)
[name], [name]                ACCOUNT NO.:  [id number]   MEDICAL RECORD NO.:  [id number]          PATIENT TYPE:  INP   LOCATION:  A212                          FACILITY:  [facility]   PHYSICIAN:  Edward L. Luan Pulling, M.D.DATE OF BIRTH:  Feb 16, 1936   DATE OF ADMISSION:  10/30/2004  DATE OF DISCHARGE:  08/09/2006LH                                 DISCHARGE SUMMARY   FINAL DISCHARGE DIAGNOSES:  1.  Chest pain, myocardial infarction ruled out.  2.  Chronic obstructive pulmonary disease.  3.  Gastroesophageal reflux disease.  4.  Hypertension.  5.  Hypothyroidism.  6.  Anxiety.   HISTORY OF PRESENT ILLNESS:  Ms.  [name] came to the emergency room after  having developed chest pain within the left side, but was somewhat atypical  in that she had a __________ with a point.  However, she has had a cardiac  catheterization in the past that shows nonobstructive coronary disease, but  because that had been 2-3 years previously, she was brought in for a rule  out MI Protocol.  Her physical examination on admission showed that she had  some moderate bilateral rhonchi.  Her heart was regular without gallops.  She had some tenderness in the area of the left side of her chest.  Her  extremities showed no edema.  Central Nervous System exam showed that she  was anxious, but otherwise, negative.   HOSPITAL COURSE:  She had serial cardiac enzymes which were negative for  infarction.  Had consultation with [facility] who she had seen  before, and underwent a stress Myoview which was negative for ischemia.  She  then had consultation with the Gastroenterology Team and underwent an EGD  which showed that she did have some reflux changes.  It was felt that her  problem was a combination of gastroesophageal reflux disease and anxiety and  COPD.  She was much improved by the time of discharge and is discharged home  in improved condition.   DISCHARGE MEDICATIONS:  1.  Prilosec 20 mg b.i.d.  2.   Synthroid 75 mcg daily.  3.  Covera HS 200 mg q.h.s.  4.  She has Xanax available for anxiety.   PLAN:  She says she is going to try to stop smoking and get back to  exercising.  I have encouraged her to do both.  Follow up in my office as  already scheduled.       ELH/MEDQ  D:  11/02/2004  T:  11/03/2004  Job:  [id number]

## 2010-08-12 NOTE — Op Note (Signed)
NAME:  [name], WASKEY                ACCOUNT NO.:  [id number]   MEDICAL RECORD NO.:  [id number]          PATIENT TYPE:  AMB   LOCATION:  DAY                           FACILITY:  [facility]   PHYSICIAN:  Hildred Laser, M.D.    DATE OF BIRTH:  1935/07/20   DATE OF PROCEDURE:  07/28/2005  DATE OF DISCHARGE:                                 OPERATIVE REPORT   PROCEDURE:  Colonoscopy.   INDICATIONS:  Nathalee is a 75 year old Caucasian female who presents with  change in her bowel habits, rectal pain and pressure and painful defecation.  She is undergoing colonoscopy for diagnostic purposes.  Procedure and risks  were reviewed with the patient and informed consent was obtained.  Family  history is negative for colorectal carcinoma.   The procedure was reviewed with the patient and informed consent was  obtained.   MEDS FOR CONSCIOUS SEDATION:  Demerol 50 mg IV, Versed 20 mg IV.   FINDINGS:  The procedure performed in endoscopy suite.  The patient's vital  signs and O2 sat were monitored during the procedure and remained stable.  The patient was placed in left lateral decubitus position and rectal  examination performed.  No abnormality noted on external or digital exam.  The Olympus videoscope was placed in the rectum and advanced under vision  into the sigmoid colon and beyond.  Preparation was satisfactory.  She had  few a small diverticula at the sigmoid colon.  The scope was passed in the  cecum which was identified by the ileocecal valve and appendiceal orifice.  Pictures taken for the record.  As the scope was withdrawn, colonic mucosa  was carefully examined and was normal throughout.  The rectal mucosa  similarly was normal.  The scope was retroflexed to examine anorectal  junction which was unremarkable.  No fissure or tear was noted.  Endoscope  was withdrawn.  The patient tolerated the procedure well.   FINAL DIAGNOSIS:  Few diverticula at sigmoid colon, otherwise normal   examination.   Suspect she may have irritable bowel syndrome and/or rectal spasm.   RECOMMENDATIONS:  1.  She will continue high-fiber diet and MiraLax 17 grams q.d. p.r.n.  2.  Levsin SL t.i.d. p.r.n., prescription given for 60 with 1 refill.      Hildred Laser, M.D.  Electronically Signed     NR/MEDQ  D:  07/28/2005  T:  07/29/2005  Job:  [id number]   cc:   Percell Miller L. Luan Pulling, M.D.  Fax: [phone]

## 2010-08-12 NOTE — Cardiovascular Report (Signed)
[place]. [facility]  Patient:    [name], [name] Visit Number: [id number] MRN: [id number]          Service Type: MED Location: CCUB 2904 01 Attending Physician:  Satira Sark Dictated by:   Wallis Bamberg Johnsie Cancel, M.D. [facility] Proc. Date: 12/18/00 Admit Date:  12/15/2000   CC:         Sinda Du, M.D.  Cristopher Estimable. Lattie Haw, M.D. [facility]  Satira Sark, M.D.   Cardiac Catheterization  INDICATIONS:  Chest pain, multiple coronary risk factors.  FINDINGS: 1. LEFT MAIN CORONARY ARTERY:  Had a 20% discrete stenosis. 2. LEFT ANTERIOR DESCENDING ARTERY:  Had a 30% tubular proximal lesion in the    distal vessel.  Normal-sized disease.  The first diagonal branch had a    30% ostial lesion. 3. CIRCUMFLEX CORONARY ARTERY:  Nondominant.  Normal. 4. RIGHT CORONARY ARTERY:  Dominant.  Normal.  It had somewhat of a high    takeoff with a shepherds crook.  There is a 40% proximal lesion.  The    mid vessel had a 30% discrete lesion.  The distal PDA had 50% diffuse    disease.  VENTRICULOGRAPHY:  RAO ventriculography revealed normal wall motion.  There was no evidence of a gradient across the aortic valve.  Ejection fraction 60% range.  There was no MR.  HEMODYNAMIC DATA: 1. Aortic pressure:  149/68. 2. Left ventricular pressure:  148/10.  IMPRESSION:  The patient has no significant coronary disease.  Her chest pain would appear to be noncardiac in etiology.  She will discharged home later today if her leg heals up well.  She will follow up with Dr. Luan Pulling.  She will continue her Lipitor therapy for hypercholesterolemia.  She was strongly encouraged to quit smoking, and has not done so in the last five days.Dictated y:   Wallis Bamberg. Johnsie Cancel, M.D. [place] Attending Physician:  Satira Sark DD:  12/18/00 TD:  12/18/00 Job: [id number]

## 2010-08-12 NOTE — Procedures (Signed)
[facility]  Patient:    [name], [name] Visit Number: [id number] MRN: [id number]          Service Type: EMS Location: ED Attending Physician:  Glo Herring. Dictated by:   Sinda Du, M.D. Disc. Date: 02/08/01                            EKG Interpretations  TIME:  1345  The rhythm is sinus rhythm with a rate of 70.  There are Q waves inferiorly and the computer read this as possible acute inferior infarction.  I believe that most of that is due to artifact.  There are T wave anteriorly which are nonspecific.  This could indicate ischemic and clinical correlation is suggested.  Abnormal electrocardiogram. Dictated by:   Sinda Du, M.D. Attending Physician:  Glo Herring DD:  02/09/01 TD:  02/09/01 Job: [id number]

## 2010-08-12 NOTE — Discharge Summary (Signed)
[place]. [facility]  Patient:    [name], [name] Visit Number: [id number] MRN: [id number]          Service Type: MED Location: CCUB 2904 01 Attending Physician:  Satira Sark Dictated by:   Richardson Dopp, P.A.-C. Admit Date:  12/15/2000 Disc. Date: 12/20/00   CC:         Sinda Du, M.D.  Satira Sark, M.D., [facility] and [facility]   Discharge Summary  DATE OF BIRTH:  [birth year].  DISCHARGE DIAGNOSES: 1. Chest pain, etiology entirely unclear. 2. Noncritical coronary artery disease by cardiac catheterization this    admission. 3. Retroperitoneal bleed post catheterization this admission, treated with    one unit packed red blood cells. 4. Treated hypothyroidism. 5. Hyperlipidemia. 6. Hypertension. 7. Tobacco abuse. 8. History of anxiety/depression. 9. Gastroesophageal reflux disease, status post Nissen fundoplication in    October 2001.  PROCEDURES PERFORMED THIS ADMISSION:  Cardiac catheterization by Dr. Jenkins Rouge on December 18, 2000, revealing left main 20%, LAD 30% proximal, D-1 30% ostial, circumflex normal, RCA 40% proximal and 30% mid, diffuse 50% distal PDA.  LV normal.  EF 60%.  HOSPITAL COURSE:  This 76 year old female presented to the emergency room at [facility] with complaints of left-sided chest pain.  The pain was worse with exertion, especially using a broom, and better with rest.  She denied any associated shortness of breath, diaphoresis, or radiation with pain.  She had some mild elevations in her total CK at 240.  Her initial MB was 7.5 and her troponin was negative at 0.01.  She was transferred to [facility] for further evaluation and treatment.  On initial exam, her blood pressure was 122/68, pulse 60.  Neck without bruits or JVD.  Chest clear.  Cardiac was regular rate and rhythm, without murmurs. No S3.  Abdomen was soft and nontender.  Extremities were without  edema.  EKG showed normal sinus rhythm without acute changes, nonspecific ST-T wave changes noted.  Chest x-ray showed no acute disease.  She was admitted to [facility] and placed on heparin.  Serial enzymes were followed.  She eventually ruled out for MI.  Her second set of enzymes showed a total CK of 187, MB 4.7, troponin-I 0.01; third set 173 CK, MB 3.4, and troponin-I 0.01.  She remained in the hospital throughout the weekend.  She had no further chest pain.  Her followup EKG showed no acute abnormalities.  It was felt that she should be evaluated with cardiac catheterization.  This was postponed on Monday, December 17, 2000, due to scheduling.  On December 18, 2000, her cardiac catheterization was performed by Dr. Jenkins Rouge.  Post procedure, she was noted to have a tender right femoral artery site, but no hematoma.  Later in the day, her hemoglobin was noted to be decreasing, as well as she was developing hypotension.  Her blood pressure was 86/48.  She was complaining of right groin pain.  Her hemoglobin dropped to 10.9 and her hematocrit dropped to 31.1.  At admission, her hemoglobin was 12.3 and her hematocrit was 35.2.  CT of her abdomen and pelvis was checked.  This showed a moderate sized retroperitoneal bleed.  She received two units of packed red blood cells in transfusion.  On December 19, 2000, her hemoglobin and hematocrit were much better at 12.3 and 35.2.  Dr. Lattie Haw saw the patient on December 20, 2000.  Her hemoglobin was 13.1 and 36.7.  He noted that the patient complained of continued pain.  No hematoma was noted on exam, but she did have diffuse lower abdominal tenderness.  It was felt that she was ready for discharge to home.  She could follow up with her primary physician.  She will be set up for a P.A. appointment next week to check her groin in our office in [place].  She has been advised to stop smoking.  LABS:  At discharge,  white blood cell count 6100, hemoglobin 13.1, hematocrit 36.7, platelet count 179,000.  On December 16, 2000, INR 1.1.  D. dimer on December 15, 2000, less than 0.5.  At discharge, sodium 143, potassium 4.3, chloride 109, CO2 27, glucose 97, BUN 14, creatinine 1.1.  Total bilirubin 0.7, alkaline phosphatase 66, AST 19, ALT 18, total protein 6, albumin 3.2, calcium 9.2, magnesium 2.0.  Total cholesterol 156, triglycerides 90, HDL 47, LDL 91.  Cardiac enzymes as noted above.  DISCHARGE MEDICATIONS: 1. Aspirin 81 mg a day. 2. Lipitor 10 mg q. h.s. 3. Synthroid 0.025 mg q.d. 4. Covera 240 mg q.d. 5. Nitroglycerin p.r.n. chest pain.  ACTIVITY:  No driving, heavy lifting, exertion, or work for one week.  DIET:  Low fat, low sodium.  She is to call our office in [place] for any increased pain, swelling, bleeding, or bruising in her right groin.  She has been advised to stop smoking cigarettes.  FOLLOWUP:  She will see the physicians assistant in our [place] office on Friday, December 28, 2000, at 12:45 p.m. for a groin check.  She can follow up with Dr. Luan Pulling in one to two weeks.  She should call for an appointment. Dictated by:   Richardson Dopp, P.A.-C. Attending Physician:  Satira Sark DD:  12/20/00 TD:  12/20/00 Job: [id number]

## 2010-08-12 NOTE — H&P (Signed)
NAME:  KINLY, DAUBE                ACCOUNT NO.:  [id number]   MEDICAL RECORD NO.:  [id number]          PATIENT TYPE:  INP   LOCATION:  A212                          FACILITY:  [facility]   PHYSICIAN:  Angus G. Everette Rank, MD   DATE OF BIRTH:  1936-01-07   DATE OF ADMISSION:  10/30/2004  DATE OF DISCHARGE:  LH                                HISTORY & PHYSICAL   A 75 year old white female came into the emergency room at [facility] with the complaint of intermittent chest pain over the  last two to three weeks.  The patient does have a history of hypertension.  She had cardiac catheterization done September 03, 2002, with mild obstructive  coronary artery disease, 50% narrowing of the first diagonal branch of the  left anterior descending, no significant obstruction.  Electrocardiogram  done in the emergency department showed normal sinus rhythm with rate of 81,  no acute changes.   LABORATORY DATA:  CBC: WBC 11,400, hemoglobin 15.2, hematocrit 44.5.  Chemistries showed sodium 132, potassium 4.2, chloride 103, CO2 23, glucose  89, BUN 12, creatinine 1, calcium 8.2.  Urinalysis negative.   The patient was given nitroglycerin with relief and subsequently admitted.  The patient had chest x-ray done which showed no acute findings,  cardiomegaly.  Also, cardiac markers were normal.  The patient was admitted  to rule out coronary disease and ischemic heart disease.   SOCIAL HISTORY:  The patient has a history of smoking.   FAMILY HISTORY:  Positive for coronary artery disease.   PAST MEDICAL HISTORY:  1.  History of dyslipidemia.  2.  Hypertension.  3.  Has been seen by Dr. Luan Pulling and Dr. Lattie Haw.  4.  This patient has history of hypothyroidism.  5.  Dyslipidemia.   MEDICATIONS:  1.  Lipitor, unknown dosage.  2.  Synthroid 75 mcg daily.  3.  Xanax 0.5 mg h.s.  4.  Aspirin 81 mg daily.  5.  Verelan.   ALLERGIES:  No known drug allergies.   REVIEW OF SYSTEMS:  HEENT:   Negative.  CARDIOPULMONARY:  Left chest pain of  several days duration, intermittent.  No cough.  No GI or genitourinary  symptoms.   PHYSICAL EXAMINATION:  GENERAL:  Alert white female.  VITAL SIGNS:  Blood pressure 160/82, pulse 69, respirations 20.  HEENT:  Eyes PERRLA.  TMs negative.  Oropharynx benign.  NECK:  Supple.  No JVD or thyroid abnormalities.  HEART:  Regular rhythm, no murmurs.  LUNGS:  Clear to percussion and auscultation.  ABDOMEN:  No palpable organs or masses, no organomegaly.  EXTREMITIES: Free of edema.   DIAGNOSES:  1.  Left chest pain.  2.  Possible ischemic heart disease.  3.  History of hypertension.       AGM/MEDQ  D:  10/30/2004  T:  10/30/2004  Job:  [id number]

## 2010-08-12 NOTE — H&P (Signed)
NAME:  [name], [name]                ACCOUNT NO.:  [id number]   MEDICAL RECORD NO.:  [id number]          PATIENT TYPE:  AMB   LOCATION:  DAY                           FACILITY:  [facility]   PHYSICIAN:  Hildred Laser, M.D.    DATE OF BIRTH:  1935/10/23   DATE OF ADMISSION:  07/26/2005  DATE OF DISCHARGE:  LH                                HISTORY & PHYSICAL   PRIMARY CARE PHYSICIAN:  Dr. Percell Miller L. Hawkins.   CHIEF COMPLAINT:  Pressure and pain in the rectum.   HISTORY OF PRESENT ILLNESS:  [name] is a 75 year old Caucasian female,  patient of Dr. Luan Pulling, who presents today for further evaluation of rectal  pain and pressure.  She says she notes this primarily with prolonged  standing.  This has been going on for three or four months.  She has noted  leakage of stools in her undergarments after bowel movements, chiefly when  her stools are loose.  She complains of midepigastric tenderness and  postprandial swelling.  She occasionally has constipation for which she  takes Dulcolax usually once a month.  She takes fiber, one cereal daily.  She denies any rectal bleeding or weight loss.  She is status post  laparoscopic Nissen fundoplication 99991111.  Recently, she started belching and  is concerned that her wrap is coming undone.  Denies any heartburn symptoms  but she is on Prilosec OTC daily.  Denies any nausea or vomiting.  She  cannot recall when her last colonoscopy was but it has been she believes  about seven or eight years.  This was done by Dr. Henrene Pastor in [place].   CURRENT MEDICATIONS:  1.  Verelan 300 mg daily.  2.  Synthroid 75 mcg daily.  3.  Xanax 0.5 mg t.i.d.  4.  Lipitor 10 mg daily.  5.  Prilosec OTC daily.  6.  Dulcolax p.r.n.  7.  Magnesium citrate p.r.n.   ALLERGIES:  1.  NUBAIN.  2.  VICODIN causes nausea and vomiting.   PAST MEDICAL HISTORY:  1.  Chronic GERD status post Nissen fundoplication in [id number] by Dr. Renelda Loma.  2.  Last EGD was by Dr. Laural Golden on November 02, 2004 when the patient was      hospitalized with chest pain.  This was a normal study.  3.  History of hypertension.  4.  Nonobstructive coronary artery disease with last catheterization in      2004.  5.  Stress Myoview last year was negative.  6.  History of benign breast nodule.  7.  Partial hysterectomy.  8.  Cholecystectomy.  9.  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   FAMILY HISTORY:  Mother had MI, diabetes, uterine carcinoma.  Father died at  age 6 due to MI.  No family history of colorectal cancer, liver disease or  chronic GI problems.   SOCIAL HISTORY:  She is married.  Has four sons.  Positive tobacco use.  No  alcohol or drug use.   REVIEW OF SYSTEMS:  See HPI  for GI.  Reports approximately 15 pound weight  loss in the last year.  She states her mother has been ill and she has not  been able to take time to eat properly.  CARDIOPULMONARY:  She denies any  palpitations, chest pain or shortness of breath.   PHYSICAL EXAMINATION:  VITAL SIGNS:  Weight 148, height 5 foot, temperature  98.4, blood pressure 142/80, pulse 68.  GENERAL:  Pleasant, well-developed, well-nourished Caucasian female in no  acute distress.  SKIN:  Warm and dry and no jaundice.  HEENT:  Conjunctivae are pink.  Sclerae are nonicteric.  Oropharyngeal  mucosa moist and pink.  No lesions or exudates.  No lymphadenopathy or  thyromegaly.  CHEST:  Lungs are clear to auscultation.  CARDIOVASCULAR:  Regular rate and rhythm.  Normal S1 and S2.  No murmurs,  gallops, or rubs.  ABDOMEN:  Positive bowel sounds, soft, nontender, and nondistended.  No  organomegaly or masses.  No rebound tenderness or guarding.  No abdominal  bruits or hernias.  Large well-healed right upper quadrant vertical scar.  EXTREMITIES:  No edema.  RECTAL:  Reveals small external hemorrhoids, nonthrombosed and nonbleeding.  Digital rectal examination is painful.  Results in  tenderness for the  patient.  She does have some hard stool in the rectal vault but no fecal  impactions.  Stool is heme negative.   IMPRESSION:  [name] is a 75 year old lady who complains of rectal pain,  fecal incontinence and intermittent constipation.  She believes it has been  seven or eight years since her last colonoscopy.  She possibly has a  fissure, although clinically she does not describe rectal pain with  defecation.  Given examination, change in bowel movements, would recommend  colonoscopy for further evaluation.   PLAN:  1.  We will retrieve the last colonoscopy report from Dr. Blanch Media office.  2.  Colonoscopy.  3.  MiraLax 17 gm daily p.r.n.  4.  ____________ one refill.  5.  Further recommendations to follow.      Neil Crouch, P.A.      Hildred Laser, M.D.  Electronically Signed    LL/MEDQ  D:  07/26/2005  T:  07/26/2005  Job:  [id number]   cc:   Percell Miller L. Luan Pulling, M.D.  Fax: [phone]

## 2010-08-12 NOTE — Cardiovascular Report (Signed)
   NAME:  [name], [name]                          ACCOUNT NO.:  [id number]   MEDICAL RECORD NO.:  [id number]                   PATIENT TYPE:  INP   LOCATION:  2030                                 FACILITY:  [facility]   PHYSICIAN:  Eustace Quail, M.D.                  DATE OF BIRTH:  06/02/1935   DATE OF PROCEDURE:  09/03/2002  DATE OF DISCHARGE:                              CARDIAC CATHETERIZATION   PROCEDURE:  The procedure was performed via the right femoral artery  utilizing an arterial sheath and 6-French preformed coronary catheters.  __________ was performed and Omnipaque contrast was used.  Right femoral  artery was closed with Perclose at the end of the procedure.  Distal  aortogram was performed to rule out the patient for renovascular causes for  her hypertension.   RESULTS:  Left main coronary artery:  Free of significant disease.   Left anterior descending artery:  Gave rise to three diagonal branches and  two septal perforators.  There was 50% ostial narrowing in the first  diagonal branch.   Circumflex artery:  The circumflex artery gave rise to marginal branch and a  small AV branch.  These vessels were free of significant disease.   Right coronary artery:  The right coronary artery is a moderate-size vessel  that gave rise to a conus branch, right ventricular branch, a posterior  descending branch, and three posterolateral branches.  There was 30%  proximal and 30% mid stenosis in the right coronary artery with  irregularities in the proximal and mid vessel.   LEFT VENTRICULOGRAM:  The left ventriculogram performed in the RAO  projection showed good wall motion with no areas of hypokinesis.  The  estimated ejection fraction was 60.   DISTAL AORTOGRAM:  Distal aortogram was performed which showed 40% narrowing  in the right renal artery, no significant obstruction in left renal artery,  no significant aortoiliac disease.   The aortic pressure was 139/69 with a mean  of 96.  Left ventricular pressure  was 139/11.   CONCLUSIONS:  Non-obstructive coronary artery disease with 50% narrowing at  the ostium of the first diagonal branch of the left anterior descending, no  significant obstruction in circumflex artery, 30% proximal and 30% mid  stenosis in the right coronary artery, and normal left ventricular function.   RECOMMENDATIONS:  Reassurance.  In view of these findings, I think the  patient's symptoms are not related to ischemia.  Will plan reassurance and  further evaluation if indicated.                                               Eustace Quail, M.D.    BB/MEDQ  D:  09/03/2002  T:  09/03/2002  Job:  [id number]

## 2010-08-12 NOTE — Procedures (Signed)
[facility]  Patient:    [name], [name]                       MRN: [id number] Proc. Date: 03/13/00 Adm. Date:  [id number] Attending:  Neita Garnet CC:         Sinda Du, M.D.  Edsel Petrin. Dalbert Batman, M.D.   Procedure Report  PROCEDURE:  Esophagogastroduodenoscopy with Savary (guidewire) dilation of the esophagus with fluoroscopic assistance.  INDICATIONS FOR PROCEDURE:  Dysphagia post fundoplication.  HISTORY OF PRESENT ILLNESS:  This is a 75 year old female who underwent laparoscopic Nissen fundoplication January 06, 2000. Reflux symptoms improved. However, post fundoplication, she has had syndrome difficulties with solid food until dysphagia. An esophagogram performed 1 month postop revealed tight esophagogastric junction with non-passage of barium tablet. She was sent to myself for evaluation and consideration of endoscopy with esophageal dilation. She was deemed an appropriate candidate. The nature of the procedure as well as the risks, benefits and alternatives were discussed. She understood and agreed to proceed.  PHYSICAL EXAMINATION:  GENERAL:  Anxious but otherwise well appearing female in no acute distress. She is alert and oriented.  VITAL SIGNS:  Stable.  LUNGS:  Clear.  HEART:  Regular.  ABDOMEN:  Soft.  DESCRIPTION OF PROCEDURE:  After informed consent was obtained, the patient was sedated with 100 mg of Demerol and 10 mg of Versed IV. The Olympus endoscope was passed orally under direct vision into the esophagus. The esophagus appeared normal throughout. No evidence of esophagitis. The region of the lower esophageal sphincter revealed no evidence of stricture. No significant resistance with the passage of the endoscope into the stomach. Evaluation of the stomach revealed intact fundoplication wrap with no hiatal hernia. The mucosa was intact. The duodenal bulb and postbulbar duodenum were normal.  THERAPY:  A  Savary guidewire was placed into the gastric antrum under fluoroscopic control. The endoscope was removed with the guidewire maintained in position. Subsequently an 18 mm dilator was passed over the guidewire. This was observed fluoroscopically. The dilator passed beyond the distal esophagus with no resistance.  IMPRESSION:  Normal appearing upper endoscopy post fundoplication. No significant resistance appreciated with the passage of the endoscope or large caliber dilator. The patient has gone several weeks without trying solid foods. At this juncture, I thinks she might be able to advance her diet with minimal difficulties.  RECOMMENDATIONS: 1. N.P.O. for 2 hours and then clear liquids for 2 hours and then soft diet    until a.m. (post dilation). 2. Regular diet in a.m. 3. Regular medical follow-up with Dr. Luan Pulling. DD:  03/13/00 TD:  03/14/00 Job: [id number]

## 2010-08-12 NOTE — Op Note (Signed)
[facility]  Patient:    [name]                       MRN: [id number] Proc. Date: 01/06/00 Adm. Date:  [id number] Attending:  Barbera Setters CC:         Sinda Du, M.D.  Docia Chuck. Geri Seminole., M.D. [facility] M. Dalbert Batman, M.D. (2)   Operative Report  PREOPERATIVE DIAGNOSIS:  Gastroesophageal reflux disease.  POSTOPERATIVE DIAGNOSIS:  Gastroesophageal reflux disease.  OPERATION PERFORMED:  Laparoscopic Nissen fundoplication.  SURGEON:  Edsel Petrin. Dalbert Batman, M.D.  ASSISTANT:  Coralie Keens, M.D.  ANESTHESIA:  INDICATIONS FOR PROCEDURE:  This is a 75 year old white female with an eight-year history of reflux symptoms.  She has substernal burning discomfort, fluid regurgitation on a daily basis.  She denies dysphagia.  She is status post cholecystectomy.  She has had an upper endoscopy recently which shows a small hiatal hernia.  Otherwise the endoscopy was normal.  Manometry shows normal peristaltic propagation, normal peristaltic amplitude and good relaxation of the lower esophageal sphincter.  Her upper GI shows a hiatal hernia with a patulous GE junction and obvious gastroesophageal reflux on exam.  Options for intervention were discussed.  She wanted to have antireflux surgical procedure performed.  She was brought to the operating room electively.  DESCRIPTION OF PROCEDURE:  Following the induction of general endotracheal anesthesia, the patients abdomen was prepped and draped in a sterile fashion. She was placed in a modified dorsal lithotomy position.  0.5% Marcaine with epinephrine was used as a local infiltration anesthetic.  A short vertical incision was made about 4 cm above the umbilicus in the midline.  The fascia was incised in the midline and the abdominal cavity entered under direct vision.  A 10 mm Hasson trocar was inserted and secured with a pursestring suture of 0 Vicryl.  A pneumoperitoneum was  created.  A video camera was inserted.  We placed two 10 mm trocars in the left upper quadrant and two 10 mm trocars in the right upper quadrant.  She had moderate adhesions in the right upper quadrant from a previous right paramedian incision.  All of these adhesions were taken down with the Harmonic scalpel.  She had a few adhesions in the left upper quadrant which were taken down in similar fashion.  A 10 mm fan retractor was placed through the right lateral port and this was used to elevate the left lobe of the liver to expose the esophageal hiatus.  This was secured to a self-retaining retractor.  With appropriate exposure and retraction, we divided the gastrohepatic ligament using the Harmonic scalpel. The Harmonic scalpel was used for most of the dissection.  We mobilized the right crus of the diaphragm and bluntly dissected the esophagus away.  We divided the anterior areolar tissues.  I then dissected the esophagus away from the left crus.  The stomach was dissected off of the left crus using the Harmonic scalpel.  Short gastric vessels were taken down using the Harmonic scalpel as well.  After doing all of this, we had an extremely mobile fundus and we were able to create a nice window behind the esophagus.  The esophageal hiatus did not look particularly enlarged.  We did place a single suture posteriorly to close the hiatus somewhat.  This was done with a #50 French bougie and an 55 French nasogastric tube in place.  We found a suitable piece of  fundus and brought it posteriorly behind the esophagus.  We found another suitable portion of the fundus on the left side and found that we could create a very loose floppy fundoplication. The fundoplication was created with three individual sutures of 0 Surgidek.  These sutures were first placed in the left side of the fundus, anterior wall of the esophagus to the right of the vagus nerve, and right side of the fundus. Each of these  sutures was placed and then tied.  Each of these sutures were about 1 cm apart, so that the fundoplication was about 2 cm in length and extremely loose and floppy.  We then sutured the fundus to the anterior crus of the diaphragm with another stitch in hopes of fixation in the abdomen.  The three sutures on the fundoplication were marked with three metal clips.  The operative field was irrigated with saline.  There was no bleeding. Photographs were taken of the completed fundoplication.  There was no other bleeding anywhere in the abdomen.  The trocars were removed under direct vision and there was no bleeding from the trocar sites.  The pneumoperitoneum was released.  The fascia above the umbilicus was closed with 0 Vicryl suture. All the skin incisions were closed with Steri-Strips and skin staples.  Clean bandages were placed and the patient was taken to the recovery room in stable condition.  Estimated blood loss was about 25 cc.  Complications were none. Sponge, needle and instrument counts were correct. DD:  01/06/00 TD:  01/08/00 Job: [id number]

## 2010-08-12 NOTE — H&P (Signed)
NAME:  [name], [name]                          ACCOUNT NO.:  [id number]   MEDICAL RECORD NO.:  [id number]                   PATIENT TYPE:  INP   LOCATION:  2030                                 FACILITY:  [facility]   PHYSICIAN:  Jenkins Rouge, M.D.                  DATE OF BIRTH:  [birth year]   DATE OF ADMISSION:  09/03/2002  DATE OF DISCHARGE:                                HISTORY & PHYSICAL   HISTORY OF PRESENT ILLNESS:  The patient is a 75 year old patient of Dr.  Percell Miller L. Luan Pulling and Dr. Jacqulyn Ducking.  She had nonobstructive disease  by a cardiac catheterization in 2002.  She has hypertension and  hyperlipidemia and hypothyroidism.  She was transferred from [facility] today for a cardiac catheterization.  She has had chest pain over  the last two to three days.  The doctors at [facility] have been  unable to relieve it.  She ruled out for a myocardial infarction.  The pain  has typical and atypical features of angina.  It is somewhat exertional, but  it sounds muscular in nature.  It is exacerbated by movement and occasional  deep breathing.  Her last cardiac catheterization was complicated by a retroperitoneal bleed,  requiring 2 units of PRBCs.   REVIEW OF SYSTEMS:  She has not had any associated palpitations.  There was  some radiation to the jaw and some diaphoresis.  Her review of systems was  otherwise remarkable for macular degeneration.  The review of systems is  otherwise benign.   PAST MEDICAL/SURGICAL HISTORY:  1. She is status post total abdominal hysterectomy.  2. Bilateral lumpectomy.  3. She has had GERD with Nissen fundoplication.   MEDICATIONS:  1. Verelan 300 mg q.h.s.  2. Lipitor 10 mg daily.  3. Baby aspirin daily.  4. Synthroid 0.075 mg daily.  5. Xanax p.r.n.   SOCIAL HISTORY:  She quit smoking two years ago.  She has a son whom I  believe is a patient of ours, named Mr. Anitra Lauth.   FAMILY HISTORY:  Remarkable for colon  cancer on the female side.  Review of  systems otherwise benign.   PHYSICAL EXAMINATION:  GENERAL:  She looks her stated age.  VITAL SIGNS:  Blood pressure 140/90, pulse 70 and regular.  LUNGS:  Clear.  NECK:  Carotids normal.  HEART:  S1, S2, normal heart sounds.  ABDOMEN:  Benign.  She has her fundoplication scar.  EXTREMITIES:  Distal pulses intact with no edema.  Her electrocardiogram shows sinus rhythm with no acute changes.  A chest x-ray shows mild cardiac enlargement.   LABORATORY DATA:  Unremarkable with a creatinine of 1.3, platelet count 235,  hemoglobin 13.9.  Initial set of enzymes is negative.   IMPRESSION:  Recurrent chest pain with a cardiac catheterization in 2002,  with 30% disease in the left anterior descending coronary  artery, 50%  diffuse disease in the posterior descending coronary artery, with normal  left ventricular function.   PLAN:  The risks of cardiac catheterization were discussed with the patient.  She is willing to proceed.  We will have to pay particular attention to her  groin, since she had a previous retroperitoneal bleed.  Further  recommendations based on the results of the heart catheterization.  Her blood pressure is being controlled fairly well with Verelan.  We will  have to check a TSH, to further access her hypothyroidism.  I am very pleased to see that she has stopped smoking.                                                Jenkins Rouge, M.D.    PN/MEDQ  D:  09/03/2002  T:  09/03/2002  Job:  [id number]   cc:   Percell Miller L. Luan Pulling, M.D.  [address]  [place]  [place] [address]  Fax: [phone]   [employer], M.D.   Jacqulyn Ducking, M.D.  [place], [place]

## 2010-08-12 NOTE — Group Therapy Note (Signed)
NAMELAKOTA, OLGUIN                ACCOUNT NO.:  [id number]   MEDICAL RECORD NO.:  [id number]          PATIENT TYPE:  INP   LOCATION:  A212                          FACILITY:  [facility]   PHYSICIAN:  Edward L. Luan Pulling, M.D.DATE OF BIRTH:  04-30-1935   DATE OF PROCEDURE:  10/31/2004  DATE OF DISCHARGE:                                   PROGRESS NOTE   PROBLEM:  Chest discomfort.   SUBJECTIVE:  Ms. Copado was admitted with chest discomfort that is  atypical.  It is in the left side underneath her left breast and she says it  feels like she has bubbles in her chest.  She has no other new complaints.  She says that she cannot take some of her medications.  She has significant  stressors at home with difficulties with both her husband and her mother.   OBJECTIVE:  GENERAL:  Her physical examination today shows she is awake and  alert.  VITAL SIGNS:  Temperature is 96.9, pulse is 56, respirations are 18, blood  pressure 130/73, O2 saturations 95% on room air.   Her initial cardiac enzymes are normal.   ASSESSMENT:  She has atypical chest pain, but did have nonobstructive  coronary disease when she was evaluated some 2-3 years ago with cardiac  catheterization.   PLAN:  My plan is to go ahead and ask for a [facility] consultation  and I have already discussed her briefly with Dr. Wilhemina Cash.       ELH/MEDQ  D:  10/31/2004  T:  10/31/2004  Job:  [id number]

## 2010-08-12 NOTE — Discharge Summary (Signed)
NAME:  [name], [name]                          ACCOUNT NO.:  [id number]   MEDICAL RECORD NO.:  [id number]                   PATIENT TYPE:  INP   LOCATION:  2030                                 FACILITY:  [facility]   PHYSICIAN:  Jenkins Rouge, M.D.                  DATE OF BIRTH:  [birth year]   DATE OF ADMISSION:  09/03/2002  DATE OF DISCHARGE:  09/04/2002                           DISCHARGE SUMMARY - REFERRING   BRIEF HISTORY:  This is a 75 year old female who was admitted to [facility] on September 03, 2002, by Dr. Johnsie Cancel.  The patient presented for  evaluation of chest pain.  She had a previous cardiac catheterization in  2002 that showed nonobstructive coronary disease.  She has been followed by  Dr. Lattie Haw for [facility] at the [facility] office.  The  patient was admitted for further evaluation.   PAST MEDICAL HISTORY:  Please see history as noted above.  1. She is status post total abdominal hysterectomy.  2. History of bilateral lumpectomy.  3. History of gastroesophageal reflux disease with Nissen fundoplication.   ALLERGIES:  No known drug allergies.   MEDICATIONS PRIOR TO ADMISSION:  1. Verelan 300 mg q.h.s.  2. Lipitor 10 mg daily.  3. Baby aspirin daily.  4. Synthroid 0.075 mg daily.  5. Xanax p.r.n.   SOCIAL HISTORY:  The patient quit smoking two years ago.   FAMILY HISTORY:  Remarkable for colon cancer on the female side.   HOSPITAL COURSE:  As noted, this patient was admitted to [facility]  for further evaluation of chest pain.  Decision was made to proceed with  cardiac catheterization on the day of admission.  The patient underwent  cardiac catheterization performed by Dr. Eustace Quail.  She was found to  have two 30% RCA lesions which were proximal and mid.  She had a 50%  diagonal lesion from the LAD.  The circumflex was normal.  The left  ventricle was normal with an EF of 60%.  There was no source for any  ischemia noted.   Arrangements were made to discharge the patient home the  following day in improved and stable condition.  She was treated empirically  with Protonix.   LABORATORY DATA:  A CBC on the day of discharge was within normal limits  with hemoglobin 12.9, hematocrit 36.6, WBCs 5.2, platelets 215,000.  Chemistry profile on the day of discharge revealed BUN 19, creatinine 1.2,  potassium 3.8, glucose 93, PT and PTT were within normal limits.  Cardiac  enzymes were negative x3.  A lipid profile is pending at the time of this  dictation.  A TSH was 0.684.   DISCHARGE MEDICATIONS:  The patient was discharged on the same medications  she was on prior to admission.  Please see the list as noted above with the  addition of Protonix 40 mg daily.  ADDENDUM TO THE LABORATORY DATA:  A chest x-ray at [facility]  showed stable borderline cardiac enlargement but no edema.   ACTIVITY:  Was to be as tolerated, although she was not to do any strenuous  activity or driving for at least two days.  She was not to lift more than  ten pounds for one week. She was told to keep her catheterization site clean  and dry.  She was told to call the office if she had any problems with the  area.   FOLLOWUP:  She was to follow up with Dr. Luan Pulling within the next few weeks.  She was to see Dr. Johnsie Cancel on a p.r.n. basis.   PROBLEMS AT THE TIME OF DISCHARGE:  1. Chest pain.  Myocardial infarction ruled out.  2. Cardiac catheterization performed the day of admission showing     insignificant coronary artery disease with no source of ischemia with     normal left ventricle and ejection fraction.  3. History of hypertension.  4. History of elevated lipids.  5. History of hypothyroidism.  6. Cervical disk disease.  7. History of anxiety and depression.  8. Degenerative joint disease.  9. Status post total abdominal hysterectomy.  10.      Status post bilateral lumpectomy.  11.      History of cardiac  catheterization in 2002 revealing insignificant     coronary artery disease complicated by retroperitoneal bleed requiring     transfusion and packed red blood cells.  12.      History of macular degeneration.       Mikey Bussing, P.A. [facility]                  Jenkins Rouge, M.D.    DR/MEDQ  D:  09/04/2002  T:  09/04/2002  Job:  [id number]   cc:   Percell Miller L. Luan Pulling, M.D.  [place]  [place] [address]  Fax: [phone]

## 2010-08-12 NOTE — Consult Note (Signed)
[name], [name]                ACCOUNT NO.:  [id number]   MEDICAL RECORD NO.:  [id number]          PATIENT TYPE:  INP   LOCATION:  A212                          FACILITY:  [facility]   PHYSICIAN:  Scarlett Presto, M.D.   DATE OF BIRTH:  Aug 18, 1935   DATE OF CONSULTATION:  10/31/2004  DATE OF DISCHARGE:                                   CONSULTATION   PRIMARY CARE PHYSICIAN:  Dr. Velvet Bathe.   CARDIOLOGIST:  Dr. Christena Deem.   HISTORY OF PRESENT ILLNESS:  [name] is a 75 year old woman who has  known nonobstructive coronary disease who was admitted for chest discomfort.  She describes 2-3 weeks of bubbling in her chest which is not associated  with exertion.  She denies any shortness of breath, PND or orthopnea.  The  discomfort will come and go and come and go at no particular times, it may  last as long as 30 minutes, it is not exacerbated by any physical activity  or emotional distress.   PAST MEDICAL HISTORY:  Significant for coronary artery disease.  She had a  heart catheterization first in 2002 which showed nonobstructive disease with  normal LV function and then again in 2004 which also showed normal LV  systolic function, nonobstructive disease, and she has been treated  medically.  She has ongoing tobacco abuse.  She has a history of  gastroesophageal reflux disease, she is status post a laparoscopic Nissen  fundoplication in [id number], and had an upper endoscopy after that which showed a  normal functioning esophageal sphincter.   MEDICATIONS:  1.  Xanax 0.5 q.h.s.  2.  Lovenox 70 mg subcu b.i.d.  3.  Lopressor 50 mg twice a day.  4.  Nicoderm patch.  5.  Zocor 20 mg q.h.s.  She is taking Verelan which is her home medication, at an unknown dose, at  the request of her physician.  I am not certain that she is taking the  Lopressor.   PAST SURGICAL HISTORY:  She has had the Nissen fundoplication.   SOCIAL HISTORY:  She lives in [place] with her husband.  She  smokes, she  has about a 100-pack-year smoking history, recently has stopped and then  started again.  She denies any alcohol or illicit drug abuse.   REVIEW OF SYSTEMS:  She denies any headaches, sinus tenderness, sinus  discharge, visual changes, pain in her throat, difficulty swallowing.  She  denies any cough productive of sputum.  She denies any shortness of breath.  She denies any skin changes.  No weight gain or weight loss.  No changes in  her bowel or bladder habits.  No bright red blood per rectum.  No melena or  hematochezia.  The remainder of her review of systems is negative.   PHYSICAL EXAMINATION:  She is a thin white female in no apparent distress,  alert and oriented x4.  Her blood pressure is 130/73, pulse 56, respirations  18, she is afebrile.  HEENT:  Exam is unremarkable.  NECK:  Supple, there is no jugular venous distention, no carotid bruits.  CHEST:  Decreased breath sounds at the bases, but no obvious rales.  CARDIOVASCULAR:  Regular with no murmur, point of maximal impulse is not  displaced.  ABDOMEN:  Soft, nontender, normoactive bowel sounds, no obvious  hepatosplenomegaly.  LOWER EXTREMITIES:  Without clubbing, cyanosis, or edema.  Pulses are 1+  throughout, no bruits are noted.  NEUROLOGIC:  Nonfocal.  GU/RECTAL/BREAST:  Exams are all deferred.   LABORATORY DATA:  White blood cell count 5.6, H&H 13 and 39, platelet count  242, INR 0.9, PT 12.7, D-Dimer was within normal limits, sodium 136,  potassium 3.7, chloride 104, bicarb 24, BUN 15, creatinine 1.4, blood sugar  99, liver function studies are within normal limits, cardiac enzymes x3 are  negative, her chest x-ray shows mild cardiomegaly, but no significant  interstitial edema and no active disease.  Electrocardiogram shows sinus  bradycardia at a rate of 55 with normal intervals, normal axis, no ischemic  ST-T wave changes, no Q waves concerning for an old myocardial infarction.   1.  So this is a  woman with chest pain, which is very atypical for coronary      disease.  She has had a recent heart catheterization which shows      nonobstructive disease, she has a normal electrocardiogram, and negative      cardiac enzymes.  2.  Gastroesophageal reflux disease, which was severe.  She required a      Nissen fundoplication.  She states that these symptoms have really been      recurring on and off since she had that done, symptoms are much more      likely to be associated with her GI.  3.  Ongoing tobacco abuse.  She is currently on nicotine replacement,      however, she is having difficulty with this.  4.  Hypertension which appears to be currently well-controlled.   My recommendations are as follows -- add aspirin 81 mg once a day.  Continue  her other medications.  I would hold her Verapamil and Lopressor tomorrow  for a stress test.  I recommend a rest stress Cardiolite be performed  tomorrow and she will need GI evaluation for what is likely severe reflux  disease.       JH/MEDQ  D:  10/31/2004  T:  10/31/2004  Job:  [id number]   cc:   Jacqulyn Ducking, M.D.   Edward L. Luan Pulling, M.D.  [place]  [place] [address]  Fax: [phone]

## 2010-08-12 NOTE — Procedures (Signed)
NAME:  [name], CHIDESTER NO.:  [id number]   MEDICAL RECORD NO.:  [id number]          PATIENT TYPE:  INP   LOCATION:  A212                          FACILITY:  [facility]   PHYSICIAN:  Dola Argyle, M.D.     DATE OF BIRTH:  January 01, 1936   DATE OF PROCEDURE:  11/01/2004  DATE OF DISCHARGE:                                    STRESS TEST   HISTORY OF PRESENT ILLNESS:  [name] is a 75 year old female with  nonobstructive coronary disease by cath in June of 2004, who was admitted to  [facility] with atypical chest discomfort. Point of care markers  were negative x3 and cardiac enzymes were negative times one.   Baseline data electrocardiogram reveals sinus rhythm at 69 beats per minute,  nonspecific ST abnormalities. Blood pressure was 150/72.   The patient exercised for a total of 5 minutes and 16 seconds to Bruce  protocol stage II in 7.0 minutes. Maximal heart rate was 138 which is 90% of  predicted maximum. Maximum blood pressure was 170/68 and resolved down to  140/62 in recovery. The patient denies any chest discomfort, shortness of  breath with exercise. Exercise was stopped secondary to fatigue.   Final images and results are pending physician's  review.      Amy B   AB/MEDQ  D:  11/01/2004  T:  11/02/2004  Job:  [id number]

## 2010-08-12 NOTE — Procedures (Signed)
[facility]  Patient:    [name], [name]                       MRN: [id number] Proc. Date: 12/06/99 Adm. Date:  [id number] Disc. Date: [id number] Attending:  Neita Garnet                           Procedure Report  PROCEDURE PERFORMED:  Esophageal manometry.  INDICATION:  Reflux disease, chest pain and dysphagia.  HISTORY:  This is a 75 year old female who is undergoing esophageal manometry for the above named reasons.  DESCRIPTION OF PROCEDURE:  The patient underwent esophageal manometry at the [facility].  Study was adequate for analysis.  FINDINGS: 1. The upper esophageal sphincter demonstrated normal relaxation and    coordination. 2. Esophageal body demonstrated normal wave amplitude and normal wave    provocation, thus, normal peristalsis. 3. Lower esophageal sphincter resting pressure was normal at 32.9 mmHg.    In addition, normal and complete relaxation with wet swallowing was    demonstrated.  IMPRESSION:  Normal esophageal manometry. DD:  12/13/99 TD:  12/14/99 Job: [id number]

## 2010-08-12 NOTE — Group Therapy Note (Signed)
NAMEKATHLENA, [name]                ACCOUNT NO.:  [id number]   MEDICAL RECORD NO.:  [id number]          PATIENT TYPE:  INP   LOCATION:  A212                          FACILITY:  [facility]   PHYSICIAN:  Edward L. Luan Pulling, M.D.DATE OF BIRTH:  07-25-1935   DATE OF PROCEDURE:  11/02/2004  DATE OF DISCHARGE:                                   PROGRESS NOTE   PROBLEM:  Chest discomfort.   SUBJECTIVE:  [name] had her stress test yesterday and it was normal.  At least it did not show any active ischemia.  She seems to be feeling well  with no new complaints.   OBJECTIVE:  GENERAL:  Her physical examination this morning shows she is  awake and alert with no complaints as mentioned.  VITAL SIGNS:  Temperature is 97.8, pulse 68, respirations 20, blood pressure  130/74, O2 saturations 99% on room air.  CHEST:  Her chest is clear.  HEART:  Heart is regular.  ABDOMEN:  Soft.   ASSESSMENT/PLAN:  She is set for esophagogastroduodenoscopy today and I  think she will be able to be discharged home after  esophagogastroduodenoscopy.       ELH/MEDQ  D:  11/02/2004  T:  11/02/2004  Job:  [id number]

## 2010-08-12 NOTE — Procedures (Signed)
NAME:  Jessica, [name] NO.:  [id number]   MEDICAL RECORD NO.:  [id number]          PATIENT TYPE:  INP   LOCATION:  A212                          FACILITY:  [facility]   PHYSICIAN:  Dola Argyle, M.D.     DATE OF BIRTH:  [birth year]   DATE OF PROCEDURE:  DATE OF DISCHARGE:                                  ECHOCARDIOGRAM   STRESS MYOVIEW:   HISTORY OF PRESENT ILLNESS:  [name] has nonobstructive coronary  disease by catheterization in the past.  Currently, she has some chest pain  in the hospital, and this study is done for further evaluation.   PROTOCOL:  The patient underwent a one day rest stress treadmill Myoview  scan.  She received 10 millicuries of Myoview at rest and 30 millicuries  with stress.  Standard myocardial spec imaging was completed along with  quantitative gaited analysis.   STRESS PROTOCOL:  The patient walked on the treadmill.  She walked 5 minutes  and 15 seconds with a peak heart rate of 135, representing approximately 86%  predicted maximum heart rate.  There was no chest pain.  There was no  arrhythmia.  There was no significant EKG change.   NUCLEAR IMAGE DATA:  The raw images revealed that there is some motion and  the motion correction protocol is used.  In addition, there is some  interference from nuclear activity from structure below the diaphragm, but  this does not interfere with the ability to read the study.  The tomographic  images reveal no diagnostic abnormalities.  Wall motion is normal, and the  ejection fraction is 67%.   Walking in the treadmill, there are no diagnostic EKG changes, and there is  no significant chest pain.  The nuclear images reveal no significant  abnormalities.  There is no sign of scar or ischemia.  There is normal wall  motion and normal ejection fraction.       JK/MEDQ  D:  11/01/2004  T:  11/02/2004  Job:  [id number]   cc:   Percell Miller L. Luan Pulling, M.D.  [address]  [place]  [place] [address]  Fax:  [phone]   Scarlett Presto, M.D.  Fax: [phone]

## 2010-08-12 NOTE — Consult Note (Signed)
[name], [name]                ACCOUNT NO.:  [id number]   MEDICAL RECORD NO.:  [id number]          PATIENT TYPE:  INP   LOCATION:  A212                          FACILITY:  [facility]   PHYSICIAN:  R. Garfield Cornea, M.D. DATE OF BIRTH:  Aug 11, 1935   DATE OF CONSULTATION:  10/31/2004  DATE OF DISCHARGE:                                   CONSULTATION   REASON FOR CONSULTATION:  Atypical chest pain.   HISTORY OF PRESENT ILLNESS:  [name] is a 75 year old Caucasian female  who reports a three week history of cramping and bubbling left upper  quadrant abdominal pain.  She also notes epigastric pain and pain that  radiates into the left upper anterior chest.  She is also complaining of  abdominal bloating and pressure.  She has tried Dulcolax and Tylenol with  minimal relief.  She has a history of GERD for many years with symptoms  including heartburn, regurgitation, indigestion.  She is on Prilosec 20 mg  daily at home which helps some.  She notes her symptoms are worse with  certain types of food.  She is status post Nissen fundoplication in  [place] by Dr. Renelda Loma in 2001.  She denies any nausea, vomiting,  dysphagia or odynophagia.  She is complaining of anorexia, denies any early  satiety.   She is being seen by Cardiology with Cardiolite Stress test planned for  tomorrow.  She has a history of nonobstructive coronary artery disease.   PAST MEDICAL HISTORY:  1. Chronic GERD.  2. Status post Nissen fundoplication in [id number] by Dr. Renelda Loma in [place].  3. Hypertension.  4. Nonobstructive coronary artery disease with last catheterization being      in 2004.  5. Benign breast nodule.  6. Partial hysterectomy.  7. Cholecystectomy.   MEDICATIONS PRIOR TO ADMISSION:  1. Xanax 0.5 mg q.h.s.  2. Verelan 300 mg daily.  3. Lipitor 10 mg daily.  4. Aspirin 81 mg daily.  5. Synthroid 75 mcg daily.  6. Prilosec 20 mg over-the-counter daily.  7. Magnesium citrate p.r.n.  8.  Dulcolax p.r.n.  9. Tylenol p.r.n.   ALLERGIES:  VICODIN CAUSES NAUSEA AND VOMITING.   FAMILY HISTORY:  Positive for a mother with MI, diabetes mellitus and  uterine carcinoma.  Father deceased at age 77 secondary to MI.  She has four  siblings with history significant for coronary artery disease.  No known  family history of colorectal carcinoma, liver or chronic GI problems.   HISTORY OF PRESENT ILLNESS:  [name] had four sons.  She reports half  pack per day tobacco use.  Denies any alcohol or drug use.   REVIEW OF SYSTEMS:  CONSTITUTIONAL:  Denies any fever or chills.  Reports a  20 pound weight loss in the past two months.  CARDIOVASCULAR:  See HPI.  PULMONOLOGY:  Denies any associated shortness of breath, dyspnea, cough or  hemoptysis.  GI:  See HPI.  She does have daily or every other day bowel  movements without any rectal bleeding or mucous in her stool or melena.  ENDOCRINE:  She has  been noting several recent hypoglycemic episodes.   PHYSICAL EXAMINATION:  VITAL SIGNS:  Weight 142.9 pounds, height 60 inches,  temperature 96.9, pulse 56, respirations 18, blood pressure 130/73, O2  saturation 97% on 2 liters per minute.  GENERAL APPEARANCE:  [name] is a 75 year old Caucasian female who is  alert and oriented, pleasant and cooperative.  No acute distress.  HEENT:  Sclerae are clear, nonicteric.  Conjunctivae are pink.  Oropharynx  pink and moist without any lesions.  NECK:  Supple without any mass or thyromegaly.  CHEST:  Heart rate regular rate and rhythm with normal S1, S2, without any  murmurs, clicks, rubs or gallops.  LUNGS:  Clear to auscultation bilaterally.  ABDOMEN:  Large well-healed right upper quadrant vertical scar.  Just  superficial beneath the scar is a raised, firm, discrete lesion, possibly  scar tissue versus lipoma to the right upper quadrant.  She has positive  bowel sounds x4, obese, soft, nontender, nondistended with no palpable mass  or  hepatosplenomegaly.  No rebound tenderness or guarding.  EXTREMITIES:  No edema bilaterally.  SKIN:  Pink, warm and dry without rash or jaundice.   LABORATORY DATA:  WBC 5.6, hemoglobin 13.6, hematocrit 39.1, platelets  242,000.  PT 12.7, INR 0.9, D. dimer 0.39, total CK 126, CK MB 4.2, troponin  negative x3.  Calcium 9.1, sodium 136, potassium 3.2, chloride 104, CO2 24,  BUN 15, creatinine 1.4, glucose 99.  __________ 91, AST 19, ALT 16, total  protein 2.3, albumin 4.1.   IMPRESSION:  [name] is a 75 year old Caucasian female with a three week  history of epigastric pain, left upper anterior chest pain, abdominal  bloating and heartburn.  Patient with history of chronic GERD and is status  post Nissen fundoplication in [id number] in [place].  She has been on Prilosec  over-the-counter 20 mg daily at home with minimal change in her symptoms.  She is undergoing stress Cardiolite tomorrow and will await cardiac  evaluation prior to further endoscopic evaluation to rule out refractory  GERD, Nissen failure and/or erosive reflux esophagitis as most of her  symptoms do appear to be GI related.   PLAN:  1. Await cardiac evaluation.  2. Add Protonix 40 mg daily.  3. Plan EGD probably November 02, 2004.   We would like to thank Dr. Luan Pulling for allowing [place] to participate in the  care of [name].       KC/MEDQ  D:  10/31/2004  T:  11/01/2004  Job:  [id number]

## 2010-08-12 NOTE — Discharge Summary (Signed)
[facility]  Patient:    [name], [name]                       MRN: [id number] Adm. Date:  [id number] Disc. Date: [id number] Attending:  Barbera Setters CC:         Scarlette Shorts, M.D.             Sinda Du, M.D.             Dr. Laural Golden, [facility]                           Discharge Summary  FINAL DIAGNOSES: 1. Gastroesophageal reflux disease. 2. Anxiety and depression. 3. Status post cholecystectomy. 4. 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 5. Status post vaginal hysterectomy. 6. Hypertension.  OPERATION PERFORMED:  Laparoscopic Nissen fundoplication, January 06, 2000.  HISTORY OF PRESENT ILLNESS:  This is a 75 year old white female with an 8 year history of acid reflux symptoms with burning discomfort and fluid regurgitation on an almost daily basis. She sometimes vomits. She has had a cholecystectomy and also and an ERCP with endoscopic sphincterotomy because of chronic pain. Her abdominal pain has resolved but her reflux symptoms have worsened over time despite the use of double dose proton pump inhibitors. She has had a 3-4 pH study which was abnormal showing increased acid reflux. She has been evaluated by Dr. Evelina Dun from otolaryngology and Dr. Scarlette Shorts and they both think that she has reflux. She had extensive workup otherwise including a normal manometry. She is brought to the hospital electively for antireflux.  PHYSICAL EXAMINATION:  GENERAL:  Pleasant, cooperative, overweight, older woman.  LUNGS:   Clear to auscultation.  HEART:  Regular rate and rhythm, no murmur.  ABDOMEN:  Somewhat obese, soft, well healed right perimedial incision, no hernia.  HOSPITAL COURSE:  On the day of admission, the patient was taken to the operating room and underwent laparoscopy and a laparoscopic Nissen fundoplication. She had moderate infusions from a previous surgery but these were taken down  uneventfully. The laparoscopic Nissen fundoplication was uneventful and technically went well.  Postoperatively, the patient did reasonably well. She had some fever postop which was thought to be secondary to atelectasis. She was swallowing liquids fine. Workup of her fever included a CBC which showed a normal white count and chest x-ray which showed probable atelectasis and a Gastrografin swallowing which showed fundoplication to be intact with no obstruction and no leak.  She progressed in her diet and activities otherwise uneventfully. She was discharged on October 15 tolerating a full liquid diet without dysphagia or chest pain. She had had a bowel movement. Her wounds were healing fine.  She did go home on October 15. She was instructed in a full liquid diet and advanced slowly with very dental soft foods. Activities were discussed. She was asked to return to see me in the office in 1 week.  DISCHARGE MEDICATIONS:  Vicodin for pain and she was to continue her usual medications which include lipitor, Xanax, Covera. DD:  01/31/00 TD:  01/31/00 Job: [id number]

## 2010-08-12 NOTE — Op Note (Signed)
[name], [name]                ACCOUNT NO.:  [id number]   MEDICAL RECORD NO.:  [id number]          PATIENT TYPE:  INP   LOCATION:  A212                          FACILITY:  [facility]   PHYSICIAN:  Hildred Laser, M.D.    DATE OF BIRTH:  [birth year]   DATE OF PROCEDURE:  11/02/2004  DATE OF DISCHARGE:                                 OPERATIVE REPORT   PROCEDURE:  Esophagogastroduodenoscopy.   INDICATIONS:  [name] is 75 year old Caucasian female who was admitted with  chest pain and has ruled out. She had stress Myoview which was negative. She  is therefore felt to have noncardiac chest pain. She is undergoing  diagnostic EGD. Procedures were reviewed the patient, informed consent was  obtained. She was quite anxious and wanted to be heavily sedated for the  procedure.   Xylocaine spray for topical pharyngeal anesthesia, Demerol 100 milligrams IV  in divided dose, Versed 6 milligrams IV.   FINDINGS:  Procedure performed in endoscopy suite. The patient's vital signs  and O2 sat were monitored during procedure and remained stable. The patient  was placed left lateral position and Olympus videoscope was passed via  oropharynx without any difficulty into esophagus.   Esophagus. Mucosa of the esophagus was normal throughout. There was at a  tiny island of ectopic gastric tissue proximal to GE junction which was at  35 cm from the incisors. There was no erythema, erosions or ulceration. No  hernia was noted.   Stomach. It was empty and distended very well with insufflation. Folds of  proximal stomach were normal. Examination mucosa at body, antrum, pyloric  channel as well as angularis, fundus and cardia was normal.   Duodenum.  Bulbar mucosa was normal. Scope was passed second part of  duodenum. The mucosa and folds were normal. Endoscope was withdrawn. The  patient tolerated the procedure well.   FINAL DIAGNOSIS:  Normal esophagogastroduodenoscopy.   RECOMMENDATIONS:  Continue with  antireflux measures and double dose PPI for  2 weeks and then a single dose thereafter.  If she remains with recurrent  chest pain, would consider esophageal manometry and a pH study off acid  suppression.       NR/MEDQ  D:  11/02/2004  T:  11/02/2004  Job:  [id number]   cc:   Percell Miller L. Luan Pulling, M.D.  [place]  [place] [address]  Fax: [phone]

## 2010-08-12 NOTE — Group Therapy Note (Signed)
NAMENARELY, DEGRAW                ACCOUNT NO.:  [id number]   MEDICAL RECORD NO.:  [id number]          PATIENT TYPE:  INP   LOCATION:  A212                          FACILITY:  [facility]   PHYSICIAN:  Edward L. Luan Pulling, M.D.DATE OF BIRTH:  04/11/1935   DATE OF PROCEDURE:  11/01/2004  DATE OF DISCHARGE:                                   PROGRESS NOTE   PROBLEM:  Chest discomfort.   SUBJECTIVE:  Ms. Mulhall says she is doing okay.  She has no new complaints.  She is set for a Cardiolite stress test today.  She has no other new  problems.  She says her chest pain seems to have resolved.  Her physical  exam shows her temperature is 97, pulse 57, respirations 20, blood pressure  95/66, O2 sat 99%.   ASSESSMENT:  She seems to be doing well.   PLAN:  I am going to totally discontinue the metoprolol.  She is refusing it  and she is already on a calcium channel blocker, verapamil.  She also needs  to have her Synthroid ordered, and I have done that.  No other new  treatments today.  We will plan to follow her.       ELH/MEDQ  D:  11/01/2004  T:  11/01/2004  Job:  [id number]

## 2010-10-08 ENCOUNTER — Emergency Department (HOSPITAL_COMMUNITY)
Admission: EM | Admit: 2010-10-08 | Discharge: 2010-10-08 | Disposition: A | Discharge status: Home / Self Care | Payer: Medicare Other | Attending: Emergency Medicine | Admitting: Emergency Medicine

## 2010-10-08 DIAGNOSIS — R109 Unspecified abdominal pain: Secondary | ICD-10-CM

## 2010-10-08 DIAGNOSIS — Z87891 Personal history of nicotine dependence: Secondary | ICD-10-CM | POA: Insufficient documentation

## 2010-10-08 DIAGNOSIS — N289 Disorder of kidney and ureter, unspecified: Secondary | ICD-10-CM | POA: Insufficient documentation

## 2010-10-08 DIAGNOSIS — E876 Hypokalemia: Secondary | ICD-10-CM | POA: Insufficient documentation

## 2010-10-08 DIAGNOSIS — R1084 Generalized abdominal pain: Secondary | ICD-10-CM | POA: Insufficient documentation

## 2010-10-08 DIAGNOSIS — N390 Urinary tract infection, site not specified: Secondary | ICD-10-CM | POA: Insufficient documentation

## 2010-10-08 DIAGNOSIS — Z79899 Other long term (current) drug therapy: Secondary | ICD-10-CM | POA: Insufficient documentation

## 2010-10-08 DIAGNOSIS — R300 Dysuria: Secondary | ICD-10-CM | POA: Insufficient documentation

## 2010-10-08 HISTORY — DX: Chronic obstructive pulmonary disease, unspecified: J44.9

## 2010-10-08 HISTORY — DX: Essential (primary) hypertension: I10

## 2010-10-08 LAB — CBC
HCT: 34.7 % — ABNORMAL LOW (ref 36.0–46.0)
Hemoglobin: 12.3 g/dL (ref 12.0–15.0)
MCHC: 35.4 g/dL (ref 30.0–36.0)
MCV: 85.5 fL (ref 78.0–100.0)

## 2010-10-08 LAB — URINALYSIS, ROUTINE W REFLEX MICROSCOPIC
Glucose, UA: NEGATIVE mg/dL
Hgb urine dipstick: NEGATIVE
Ketones, ur: NEGATIVE mg/dL
pH: 5.5 (ref 5.0–8.0)

## 2010-10-08 LAB — BASIC METABOLIC PANEL
BUN: 28 mg/dL — ABNORMAL HIGH (ref 6–23)
Chloride: 98 mEq/L (ref 96–112)
Creatinine, Ser: 1.59 mg/dL — ABNORMAL HIGH (ref 0.50–1.10)
GFR calc non Af Amer: 32 mL/min — ABNORMAL LOW (ref >60)
Glucose, Bld: 98 mg/dL (ref 70–99)
Potassium: 3.2 mEq/L — ABNORMAL LOW (ref 3.5–5.1)

## 2010-10-08 LAB — URINE MICROSCOPIC-ADD ON

## 2010-10-08 MED ORDER — ONDANSETRON HCL 4 MG/2ML IJ SOLN
4.0000 mg | Freq: Once | INTRAMUSCULAR | Status: AC
  Administered 2010-10-08: 4 mg via INTRAVENOUS
  Filled 2010-10-08: qty 2

## 2010-10-08 MED ORDER — NITROFURANTOIN MONOHYD MACRO 100 MG PO CAPS: 100.0000 mg | ORAL_CAPSULE | Freq: Two times a day (BID) | ORAL | Status: DC

## 2010-10-08 MED ORDER — MORPHINE SULFATE 2 MG/ML IJ SOLN
2.0000 mg | Freq: Once | INTRAMUSCULAR | Status: AC
  Administered 2010-10-08: 2 mg via INTRAVENOUS
  Filled 2010-10-08: qty 1

## 2010-10-08 MED ORDER — NITROFURANTOIN MACROCRYSTAL 100 MG PO CAPS
ORAL_CAPSULE | ORAL | Status: AC
  Administered 2010-10-08: 100 mg
  Filled 2010-10-08: qty 1

## 2010-10-08 MED ORDER — POTASSIUM CHLORIDE 20 MEQ/15ML (10%) PO LIQD
40.0000 meq | Freq: Once | ORAL | Status: AC
  Administered 2010-10-08: 40 meq via ORAL
  Filled 2010-10-08: qty 30

## 2010-10-08 MED ORDER — SODIUM CHLORIDE 0.9 % IJ SOLN
INTRAMUSCULAR | Status: AC
  Administered 2010-10-08: 22:00:00
  Filled 2010-10-08: qty 10

## 2010-10-08 NOTE — ED Provider Notes (Addendum)
History     Chief Complaint  Patient presents with  . Abdominal Pain   Patient is a 75 y.o. female presenting with abdominal pain. The history is provided by the patient (pt seen 1 week ago by her PCP, dr. Luan Pulling.  she has been on cipro x 6 days.). No language interpreter was used.  Abdominal Pain The primary symptoms of the illness include abdominal pain and dysuria. The primary symptoms of the illness do not include fever, fatigue, nausea, vomiting, diarrhea, hematochezia, vaginal discharge or vaginal bleeding. Episode onset: 1 week ago. The onset of the illness was sudden. The problem has not changed since onset. The pain came on suddenly. The abdominal pain is located in the LLQ, RLQ and suprapubic region. The abdominal pain does not radiate. The severity of the abdominal pain is 9/10. The abdominal pain is relieved by nothing.  The dysuria is associated with frequency and urgency. The dysuria is not associated with hematuria.  The patient states that she believes she is currently not pregnant. The patient has not had a change in bowel habit. Risk factors for an acute abdominal problem include being elderly. Additional symptoms associated with the illness include urgency and frequency. Symptoms associated with the illness do not include chills, anorexia, diaphoresis or hematuria. Significant associated medical issues include diverticulitis.    Past Medical History  Diagnosis Date  . Thyroid disease   . Hypertension   . COPD (chronic obstructive pulmonary disease)     Past Surgical History  Procedure Date  . Abdominal hysterectomy   . Cholecystectomy   . Breast lumpectomy     History reviewed. No pertinent family history.  History  Substance Use Topics  . Smoking status: Former Smoker    Quit date: 09/15/2010  . Smokeless tobacco: Not on file  . Alcohol Use: No    OB History    Grav Para Term Preterm Abortions TAB SAB Ect Mult Living                  Review of Systems   Constitutional: Negative for fever, chills, diaphoresis and fatigue.  Gastrointestinal: Positive for abdominal pain and abdominal distention. Negative for nausea, vomiting, diarrhea, blood in stool, hematochezia, anal bleeding and anorexia.  Genitourinary: Positive for dysuria, urgency and frequency. Negative for hematuria, vaginal bleeding and vaginal discharge.  All other systems reviewed and are negative.    Physical Exam  BP 141/74  Pulse 57  Temp(Src) 98 F (36.7 C) (Oral)  Resp 20  Ht 5\' 1"  (1.549 m)  Wt 148 lb (67.132 kg)  BMI 27.96 kg/m2  SpO2 96%  Physical Exam  Nursing note and vitals reviewed. Constitutional: She is oriented to person, place, and time. Vital signs are normal. She appears well-developed and well-nourished. No distress.  HENT:  Head: Normocephalic and atraumatic.  Right Ear: External ear normal.  Left Ear: External ear normal.  Nose: Nose normal.  Mouth/Throat: No oropharyngeal exudate.  Eyes: Conjunctivae and EOM are normal. Pupils are equal, round, and reactive to light. Right eye exhibits no discharge. Left eye exhibits no discharge. No scleral icterus.  Neck: Normal range of motion. Neck supple. No JVD present. No tracheal deviation present. No thyromegaly present.  Cardiovascular: Normal rate, regular rhythm, normal heart sounds, intact distal pulses and normal pulses.  Exam reveals no gallop and no friction rub.   No murmur heard. Pulmonary/Chest: Effort normal and breath sounds normal. No stridor. No respiratory distress. She has no wheezes. She has no rales.  She exhibits no tenderness.  Abdominal: Soft. Normal appearance and bowel sounds are normal. She exhibits distension. She exhibits no mass. There is tenderness. There is no rebound and no guarding.  Musculoskeletal: Normal range of motion. She exhibits no edema and no tenderness.  Lymphadenopathy:    She has no cervical adenopathy.  Neurological: She is alert and oriented to person, place,  and time. She has normal reflexes. No cranial nerve deficit. Coordination normal. GCS eye subscore is 4. GCS verbal subscore is 5. GCS motor subscore is 6.  Reflex Scores:      Tricep reflexes are 2+ on the right side and 2+ on the left side.      Bicep reflexes are 2+ on the right side and 2+ on the left side.      Brachioradialis reflexes are 2+ on the right side and 2+ on the left side.      Patellar reflexes are 2+ on the right side and 2+ on the left side.      Achilles reflexes are 2+ on the right side and 2+ on the left side. Skin: Skin is warm and dry. No rash noted. She is not diaphoretic.  Psychiatric: She has a normal mood and affect. Her speech is normal and behavior is normal. Judgment and thought content normal. Cognition and memory are normal.    ED Course  Procedures  MDM Pt feeling better.  Labs explained to her.  Dr. Randal Buba saw pt.   Attending Attestation: See and examined AO3 RRR CTAB NABS, suprapubic tenderness Patient to follow up this week with Dr. Luan Pulling for repeat urine, potassium and BUN/Cr check.  Patient and son verbalize understanding and will follow up or return with worsening symptoms  Medical screening examination/treatment/procedure(s) were performed by non-physician practitioner and as supervising physician I was immediately available for consultation/collaboration.Duaine Dredge, PA .att 10/08/10 2252  Carlisle Beers, MD 10/08/10 2306  Carlisle Beers, MD 01/29/11 [phone]

## 2010-10-08 NOTE — ED Notes (Signed)
Pt presents with c/o low abdominal/ bladder pain. Pt state she has a bladder infection. Pt states pain started today but pt was seen by Dr Luan Pulling for UTI. NAD at this time.

## 2010-10-10 LAB — URINE CULTURE
Colony Count: NO GROWTH
Culture  Setup Time: 201207152052

## 2010-11-01 ENCOUNTER — Other Ambulatory Visit (HOSPITAL_COMMUNITY): Payer: Self-pay | Admitting: Pulmonary Disease

## 2010-11-01 DIAGNOSIS — R1031 Right lower quadrant pain: Secondary | ICD-10-CM

## 2010-11-03 ENCOUNTER — Ambulatory Visit (HOSPITAL_COMMUNITY)
Admission: RE | Admit: 2010-11-03 | Discharge: 2010-11-03 | Disposition: A | Discharge status: Home / Self Care | Payer: Medicare Other | Source: Ambulatory Visit | Attending: Pulmonary Disease | Admitting: Pulmonary Disease

## 2010-11-03 DIAGNOSIS — K5730 Diverticulosis of large intestine without perforation or abscess without bleeding: Secondary | ICD-10-CM | POA: Insufficient documentation

## 2010-11-03 DIAGNOSIS — R933 Abnormal findings on diagnostic imaging of other parts of digestive tract: Secondary | ICD-10-CM | POA: Insufficient documentation

## 2010-11-03 DIAGNOSIS — R1031 Right lower quadrant pain: Secondary | ICD-10-CM | POA: Insufficient documentation

## 2010-11-03 DIAGNOSIS — Z8542 Personal history of malignant neoplasm of other parts of uterus: Secondary | ICD-10-CM | POA: Insufficient documentation

## 2010-11-03 MED ORDER — IOHEXOL 300 MG/ML  SOLN
80.0000 mL | Freq: Once | INTRAMUSCULAR | Status: AC | PRN
  Administered 2010-11-03: 80 mL via INTRAVENOUS

## 2010-11-07 ENCOUNTER — Encounter: Payer: Self-pay | Admitting: *Deleted

## 2010-11-29 ENCOUNTER — Ambulatory Visit (INDEPENDENT_AMBULATORY_CARE_PROVIDER_SITE_OTHER): Payer: Medicare Other | Admitting: Gastroenterology

## 2010-11-29 ENCOUNTER — Encounter: Payer: Self-pay | Admitting: Gastroenterology

## 2010-11-29 VITALS — BP 139/77 | HR 56 | Temp 97.6°F | Ht 60.0 in | Wt 148.0 lb

## 2010-11-29 DIAGNOSIS — K6289 Other specified diseases of anus and rectum: Secondary | ICD-10-CM

## 2010-11-29 NOTE — Progress Notes (Signed)
Referring Provider: Alonza Bogus, MD Primary Care Physician:  Alonza Bogus, MD Primary Gastroenterologist: Dr. Gala Romney   Chief Complaint  Patient presents with  . Rectal Problems    pressure    HPI:   [name] is a pleasant 75 year old Caucasian female who presents today in routine follow-up. She has a history of chronic right-sided abdominal pain, nausea, IBS, and constipation. Her last colonoscopy was in 2007 with few diverticula, otherwise normal. She returns today complaining of rectal pressure for the past 3-4 months. This is worse with sitting. States it "shoots up". She has a BM every day, denies brbpr. Denies any pain with bowel movements.  She also reports lower abdominal pain/pressure, feeling like she "hast to urinate all the time". Described as constant. Not associated with eating or drinking. She actually underwent a CT ordered by Dr. Luan Pulling in August. Findings below.     CT August 7 (Dr. Luan Pulling) IMPRESSION: Sigmoid diverticulosis without evidence of extraluminal inflammation. No inflammation surrounds the appendix. Post surgery gastroesophageal junction. Mild thickening at this level incompletely assessed secondary to under distension. Probable renal cysts some of which are too small to adequately  characterize. Atherosclerotic type changes aorta and branch vessels as detailed above.  Past Medical History  Diagnosis Date  . Thyroid disease   . Hypertension   . COPD (chronic obstructive pulmonary disease)   . Colitis due to Clostridium difficile     1994  . ASCVD (arteriosclerotic cardiovascular disease)   . Hyperlipidemia   . Depression   . Anxiety   . GERD (gastroesophageal reflux disease)   . Asthma   . Hypothyroidism   . S/P endoscopy 2009    linear esophageal erosions  . S/P colonoscopy 2007    few diverticula, otherwise nl    Past Surgical History  Procedure Date  . Abdominal hysterectomy   . Cholecystectomy   . Breast lumpectomy   .  Cataract extraction   . Nissen fundoplication     Current Outpatient Prescriptions  Medication Sig Dispense Refill  . ALPRAZolam (XANAX) 0.5 MG tablet Take 0.5 mg by mouth 3 (three) times daily.        Marland Kitchen aspirin 81 MG tablet Take 81 mg by mouth daily.        Marland Kitchen atorvastatin (LIPITOR) 10 MG tablet Take 10 mg by mouth daily.        . fish oil-omega-3 fatty acids 1000 MG capsule Take 2 g by mouth daily.        . hydrochlorothiazide 25 MG tablet Take 12.5 mg by mouth daily.       Marland Kitchen HYDROcodone-acetaminophen (VICODIN) 5-500 MG per tablet Take 1 tablet by mouth every 4 (four) hours as needed.       Marland Kitchen levothyroxine (SYNTHROID, LEVOTHROID) 75 MCG tablet Take 75 mcg by mouth daily.        . nicotine (NICODERM CQ - DOSED IN MG/24 HOURS) 21 mg/24hr patch Place 1 patch onto the skin daily.        Marland Kitchen omeprazole (PRILOSEC) 40 MG capsule Take 40 mg by mouth daily.       . potassium chloride SA (K-DUR,KLOR-CON) 20 MEQ tablet Take 20 mEq by mouth 2 (two) times daily.        . Verapamil HCl CR 300 MG CP24 Take 300 mg by mouth at bedtime.       Marland Kitchen atorvastatin (LIPITOR) 20 MG tablet       . ciprofloxacin (CIPRO) 250 MG tablet Take 250 mg by  mouth 2 (two) times daily.        . verapamil (VERELAN PM) 360 MG 24 hr capsule Take 360 mg by mouth at bedtime.          Allergies as of 11/29/2010 - Review Complete 11/29/2010  Allergen Reaction Noted  . Hydrocodone-acetaminophen    . Nalbuphine    . Paroxetine    . Venlafaxine      Family History  Problem Relation Age of Onset  . Colon cancer Neg Hx     History   Social History  . Marital Status: Married    Spouse Name: N/A    Number of Children: N/A  . Years of Education: N/A   Social History Main Topics  . Smoking status: Former Smoker    Quit date: 09/15/2010  . Smokeless tobacco: None  . Alcohol Use: No  . Drug Use: No  . Sexually Active: Not Currently    Review of Systems: Gen: Denies fever, chills, anorexia. Denies fatigue, weakness, weight  loss.  CV: Denies chest pain, palpitations, syncope, peripheral edema, and claudication. Resp: Denies dyspnea at rest, cough, wheezing, coughing up blood, and pleurisy. GI: Denies vomiting blood, jaundice, and fecal incontinence.   Denies dysphagia or odynophagia. Derm: Denies rash, itching, dry skin Psych: Denies depression, anxiety, memory loss, confusion. No homicidal or suicidal ideation.  Heme: Denies bruising, bleeding, and enlarged lymph nodes.  Physical Exam: BP 139/77  Pulse 56  Temp(Src) 97.6 F (36.4 C) (Temporal)  Ht 5' (1.524 m)  Wt 148 lb (67.132 kg)  BMI 28.90 kg/m2 General:   Alert and oriented. No distress noted. Pleasant and cooperative. Talkative Head:  Normocephalic and atraumatic. Eyes:  Conjuctiva clear without scleral icterus. Mouth:  Oral mucosa pink and moist. Good dentition. No lesions. Neck:  Supple, without mass or thyromegaly. Heart:  S1, S2 present without murmurs, rubs, or gallops.  Abdomen:  +BS, soft, mildly tender to palpation lower abdomen.  non-distended. No rebound or guarding. No HSM or masses noted. Msk:  Symmetrical without gross deformities. Normal posture. Extremities:  Without edema. Neurologic:  Alert and  oriented x4;  grossly normal neurologically. Skin:  Intact without significant lesions or rashes. Cervical Nodes:  No significant cervical adenopathy. Psych:  Alert and cooperative. Normal mood and affect.

## 2010-11-29 NOTE — Patient Instructions (Signed)
We have set you up for a colonoscopy with Dr. Gala Romney.  Further recommendations to follow once this is completed.  Keep up the good work!! Medical sales representative on quitting smoking!

## 2010-11-30 ENCOUNTER — Encounter: Payer: Self-pay | Admitting: Gastroenterology

## 2010-12-01 ENCOUNTER — Encounter: Payer: Self-pay | Admitting: Gastroenterology

## 2010-12-01 DIAGNOSIS — K6289 Other specified diseases of anus and rectum: Secondary | ICD-10-CM | POA: Insufficient documentation

## 2010-12-01 NOTE — Progress Notes (Signed)
Cc to PCP 

## 2010-12-01 NOTE — Assessment & Plan Note (Signed)
75 year old Caucasian female with last colonoscopy in 2007 with few diverticula. She returns today complaining of rectal pressure for the past 3-4 months. This is worse with sitting. States it "shoots up". She has a BM every day, denies brbpr. Denies any pain with bowel movements. She also has a chronic history of right-sided abdominal pain, which is stable for now. New symptoms of lower abdominal pain described as "constant and pressure". CT findings outlined above. Will proceed with colonoscopy in near future with Dr. Gala Romney for further evaluation of lower GI tract.   Proceed with TCS with Dr. Gala Romney in near future: the risks, benefits, and alternatives have been discussed with the patient in detail. The patient states understanding and desires to proceed.

## 2010-12-09 DIAGNOSIS — K5730 Diverticulosis of large intestine without perforation or abscess without bleeding: Secondary | ICD-10-CM

## 2010-12-09 DIAGNOSIS — K620 Anal polyp: Secondary | ICD-10-CM

## 2010-12-09 DIAGNOSIS — K5900 Constipation, unspecified: Secondary | ICD-10-CM

## 2010-12-09 DIAGNOSIS — K621 Rectal polyp: Secondary | ICD-10-CM

## 2010-12-09 DIAGNOSIS — D126 Benign neoplasm of colon, unspecified: Secondary | ICD-10-CM

## 2010-12-12 ENCOUNTER — Other Ambulatory Visit (HOSPITAL_COMMUNITY): Payer: Self-pay | Admitting: Pulmonary Disease

## 2010-12-12 DIAGNOSIS — N644 Mastodynia: Secondary | ICD-10-CM

## 2010-12-12 DIAGNOSIS — N63 Unspecified lump in unspecified breast: Secondary | ICD-10-CM

## 2010-12-12 DIAGNOSIS — Z139 Encounter for screening, unspecified: Secondary | ICD-10-CM

## 2010-12-13 ENCOUNTER — Ambulatory Visit (HOSPITAL_COMMUNITY)
Admission: RE | Admit: 2010-12-13 | Discharge: 2010-12-13 | Disposition: A | Discharge status: Home / Self Care | Payer: Medicare Other | Source: Ambulatory Visit | Attending: Internal Medicine | Admitting: Internal Medicine

## 2010-12-13 ENCOUNTER — Other Ambulatory Visit: Payer: Self-pay | Admitting: Internal Medicine

## 2010-12-13 ENCOUNTER — Encounter (HOSPITAL_COMMUNITY): Payer: Self-pay | Admitting: *Deleted

## 2010-12-13 ENCOUNTER — Encounter (HOSPITAL_COMMUNITY)
Admission: RE | Disposition: A | Discharge status: Home / Self Care | Payer: Self-pay | Source: Ambulatory Visit | Attending: Internal Medicine

## 2010-12-13 DIAGNOSIS — J4489 Other specified chronic obstructive pulmonary disease: Secondary | ICD-10-CM | POA: Insufficient documentation

## 2010-12-13 DIAGNOSIS — Z79899 Other long term (current) drug therapy: Secondary | ICD-10-CM | POA: Insufficient documentation

## 2010-12-13 DIAGNOSIS — R1032 Left lower quadrant pain: Secondary | ICD-10-CM | POA: Insufficient documentation

## 2010-12-13 DIAGNOSIS — E785 Hyperlipidemia, unspecified: Secondary | ICD-10-CM | POA: Insufficient documentation

## 2010-12-13 DIAGNOSIS — K5730 Diverticulosis of large intestine without perforation or abscess without bleeding: Secondary | ICD-10-CM | POA: Insufficient documentation

## 2010-12-13 DIAGNOSIS — D129 Benign neoplasm of anus and anal canal: Secondary | ICD-10-CM | POA: Insufficient documentation

## 2010-12-13 DIAGNOSIS — Z7982 Long term (current) use of aspirin: Secondary | ICD-10-CM | POA: Insufficient documentation

## 2010-12-13 DIAGNOSIS — K6289 Other specified diseases of anus and rectum: Secondary | ICD-10-CM | POA: Insufficient documentation

## 2010-12-13 DIAGNOSIS — D126 Benign neoplasm of colon, unspecified: Secondary | ICD-10-CM | POA: Insufficient documentation

## 2010-12-13 DIAGNOSIS — J449 Chronic obstructive pulmonary disease, unspecified: Secondary | ICD-10-CM | POA: Insufficient documentation

## 2010-12-13 DIAGNOSIS — D128 Benign neoplasm of rectum: Secondary | ICD-10-CM | POA: Insufficient documentation

## 2010-12-13 HISTORY — PX: COLONOSCOPY: SHX5424

## 2010-12-13 SURGERY — COLONOSCOPY
Anesthesia: Moderate Sedation

## 2010-12-13 MED ORDER — SODIUM CHLORIDE 0.45 % IV SOLN
Freq: Once | INTRAVENOUS | Status: AC
  Administered 2010-12-13: 08:00:00 via INTRAVENOUS

## 2010-12-13 MED ORDER — MIDAZOLAM HCL 5 MG/5ML IJ SOLN
INTRAMUSCULAR | Status: AC
  Filled 2010-12-13: qty 10

## 2010-12-13 MED ORDER — MEPERIDINE HCL 100 MG/ML IJ SOLN
INTRAMUSCULAR | Status: DC | PRN
  Administered 2010-12-13: 25 mg
  Administered 2010-12-13 (×2): 50 mg

## 2010-12-13 MED ORDER — PROMETHAZINE HCL 25 MG/ML IJ SOLN
INTRAMUSCULAR | Status: AC
  Filled 2010-12-13: qty 1

## 2010-12-13 MED ORDER — PROMETHAZINE HCL 25 MG/ML IJ SOLN
INTRAMUSCULAR | Status: DC | PRN
  Administered 2010-12-13 (×2): 12.5 mg via INTRAVENOUS

## 2010-12-13 MED ORDER — MEPERIDINE HCL 100 MG/ML IJ SOLN
INTRAMUSCULAR | Status: AC
  Filled 2010-12-13: qty 2

## 2010-12-13 MED ORDER — MIDAZOLAM HCL 5 MG/5ML IJ SOLN
INTRAMUSCULAR | Status: DC | PRN
  Administered 2010-12-13 (×2): 2 mg via INTRAVENOUS
  Administered 2010-12-13 (×2): 1 mg via INTRAVENOUS

## 2010-12-13 NOTE — Op Note (Signed)
NAME:  [name], [name]                ACCOUNT NO.:  [id number]  MEDICAL RECORD NO.:  [id number]  LOCATION:  APPO                          FACILITY:  [facility]  PHYSICIAN:  R. Garfield Cornea, MD [place]:  05-14-1935  DATE OF PROCEDURE:  12/13/2010 DATE OF DISCHARGE:                              OPERATIVE REPORT   INDICATIONS FOR PROCEDURE:  A 75 year old lady with some constipation, rectal pressure and pain for the past 4 months, worse with sitting and sometimes worse having a bowel movement.  CT negative for abscess or other acute abnormalities.  She has been seen by Dr. Glo Herring.  From a GYN standpoint, the patient denies being told of a specific GYN diagnosis.  She has been given some hormonal cream to take.  It has been sometime since she had a colonoscopy.  Colonoscopy is now being done to further evaluate her symptoms.  Risks, benefits, limitations, alternatives and imponderables have been reviewed.  She was also commended for smoking cessation.  PROCEDURE NOTE:  O2 saturation, blood pressure, pulse and respirations were monitored throughout the entire procedure.  CONSCIOUS SEDATION: 1. Versed 125 mg IV and Versed 60 mg IV in divided doses. 2. Phenergan 25 mg IV in divided doses, diluted slow IV push to     augment conscious sedation.  INSTRUMENT:  Pentax video chip system.  FINDINGS:  Digital rectal exam revealed some tenderness of the anal canal.  No obvious mass.  ENDOSCOPIC FINDINGS:  Prep was adequate.  Colon:  Colonic mucosa was surveyed from the rectosigmoid junction through the left transverse right colon to the appendiceal orifice, ileocecal valve/cecum.  I attempted to photograph these landmarks for the record.  However, global computer failure in the [facility] system precluded me from taking any photographs.  From this level, the scope was slowly and cautiously withdrawn.  All previously mentioned mucosal surfaces were again seen.  The patient had a  hyperplastic appearing 4-mm polyp in the base of the cecum which was hot snare removed.  The patient had extensive left-sided diverticula.  However, the remainder of the colonic mucosa appeared normal.  Scope was pulled down into the rectum, where a thorough examination of the rectal mucosa including retroflexed view of the anal verge demonstrated a single diminutive polyp just inside the anal verge (2-cm).  This polyp was cold biopsied/removed.  The patient tolerated the procedure well.  Cecal withdrawal time 12 minutes.  IMPRESSION: 1. Diminutive rectal polyp, status post cold biopsy removal     tender/painful anal canal, query occult anal fissure nothing     objectively seen, however. 2. Left-sided diverticulosis.  Cecal polyp, status post hot snare     polypectomy.  RECOMMENDATIONS: 1. We will treat him empirically for an anal fissure with a Anusol-HC     cream alternating with nitroglycerin 0.2% ointment q.6 h.     (alternating every 3), daily fiber supplement in the way of     Benefiber.  We will followup on path. 2. Further recommendations follow. 3. Use of nitroglycerin was reviewed with the patient's relatives.     Potential for hypotension and headache have been reviewed.  Further recommendations to follow pending review  of pathology report.     Bridgette Habermann, MD FACP [facility]     RMR/MEDQ  D:  12/13/2010  T:  12/13/2010  Job:  [id number]

## 2010-12-13 NOTE — H&P (Signed)
Jessica Emperor, NP  12/01/2010  2:24 PM  Signed   Referring Provider: Alonza Bogus, MD Primary Care Physician:  Alonza Bogus, MD Primary Gastroenterologist: Dr. Gala Romney     Chief Complaint   Patient presents with   .  Rectal Problems       pressure      HPI:    [name] is a pleasant 75 year old Caucasian female who presents today in routine follow-up. She has a history of chronic right-sided abdominal pain, nausea, IBS, and constipation. Her last colonoscopy was in 2007 with few diverticula, otherwise normal. She returns today complaining of rectal pressure for the past 3-4 months. This is worse with sitting. States it "shoots up". She has a BM every day, denies brbpr. Denies any pain with bowel movements.   She also reports lower abdominal pain/pressure, feeling like she "hast to urinate all the time". Described as constant. Not associated with eating or drinking. She actually underwent a CT ordered by Dr. Luan Pulling in August. Findings below.        CT August 7 (Dr. Luan Pulling) IMPRESSION: Sigmoid diverticulosis without evidence of extraluminal inflammation. No inflammation surrounds the appendix. Post surgery gastroesophageal junction. Mild thickening at this level incompletely assessed secondary to under distension. Probable renal cysts some of which are too small to adequately   characterize. Atherosclerotic type changes aorta and branch vessels as detailed above.    Past Medical History   Diagnosis  Date   .  Thyroid disease     .  Hypertension     .  COPD (chronic obstructive pulmonary disease)     .  Colitis due to Clostridium difficile         1994   .  ASCVD (arteriosclerotic cardiovascular disease)     .  Hyperlipidemia     .  Depression     .  Anxiety     .  GERD (gastroesophageal reflux disease)     .  Asthma     .  Hypothyroidism     .  S/P endoscopy  2009       linear esophageal erosions   .  S/P colonoscopy  2007       few diverticula, otherwise nl         Past Surgical History   Procedure  Date   .  Abdominal hysterectomy     .  Cholecystectomy     .  Breast lumpectomy     .  Cataract extraction     .  Nissen fundoplication         Current Outpatient Prescriptions   Medication  Sig  Dispense  Refill   .  ALPRAZolam (XANAX) 0.5 MG tablet  Take 0.5 mg by mouth 3 (three) times daily.           Marland Kitchen  aspirin 81 MG tablet  Take 81 mg by mouth daily.           Marland Kitchen  atorvastatin (LIPITOR) 10 MG tablet  Take 10 mg by mouth daily.           .  fish oil-omega-3 fatty acids 1000 MG capsule  Take 2 g by mouth daily.           .  hydrochlorothiazide 25 MG tablet  Take 12.5 mg by mouth daily.          Marland Kitchen  HYDROcodone-acetaminophen (VICODIN) 5-500 MG per tablet  Take 1 tablet by mouth every 4 (four) hours as needed.          Marland Kitchen  levothyroxine (SYNTHROID, LEVOTHROID) 75 MCG tablet  Take 75 mcg by mouth daily.           .  nicotine (NICODERM CQ - DOSED IN MG/24 HOURS) 21 mg/24hr patch  Place 1 patch onto the skin daily.           Marland Kitchen  omeprazole (PRILOSEC) 40 MG capsule  Take 40 mg by mouth daily.          .  potassium chloride SA (K-DUR,KLOR-CON) 20 MEQ tablet  Take 20 mEq by mouth 2 (two) times daily.           .  Verapamil HCl CR 300 MG CP24  Take 300 mg by mouth at bedtime.          Marland Kitchen  atorvastatin (LIPITOR) 20 MG tablet           .  ciprofloxacin (CIPRO) 250 MG tablet  Take 250 mg by mouth 2 (two) times daily.           .  verapamil (VERELAN PM) 360 MG 24 hr capsule  Take 360 mg by mouth at bedtime.               Allergies as of 11/29/2010 - Review Complete 11/29/2010   Allergen  Reaction  Noted   .  Hydrocodone-acetaminophen       .  Nalbuphine       .  Paroxetine       .  Venlafaxine           Family History   Problem  Relation  Age of Onset   .  Colon cancer  Neg Hx         History       Social History   .  Marital Status:  Married       Spouse Name:  N/A       Number of Children:  N/A   .  Years of Education:  N/A       Social  History Main Topics   .  Smoking status:  Former Smoker       Quit date:  09/15/2010   .  Smokeless tobacco:  None   .  Alcohol Use:  No   .  Drug Use:  No   .  Sexually Active:  Not Currently        Review of Systems: Gen: Denies fever, chills, anorexia. Denies fatigue, weakness, weight loss.   CV: Denies chest pain, palpitations, syncope, peripheral edema, and claudication. Resp: Denies dyspnea at rest, cough, wheezing, coughing up blood, and pleurisy. GI: Denies vomiting blood, jaundice, and fecal incontinence.   Denies dysphagia or odynophagia. Derm: Denies rash, itching, dry skin Psych: Denies depression, anxiety, memory loss, confusion. No homicidal or suicidal ideation.   Heme: Denies bruising, bleeding, and enlarged lymph nodes.   Physical Exam: BP 139/77  Pulse 56  Temp(Src) 97.6 F (36.4 C) (Temporal)  Ht 5' (1.524 m)  Wt 148 lb (67.132 kg)  BMI 28.90 kg/m2 General:   Alert and oriented. No distress noted. Pleasant and cooperative. Talkative Head:  Normocephalic and atraumatic. Eyes:  Conjuctiva clear without scleral icterus. Mouth:  Oral mucosa pink and moist. Good dentition. No lesions. Neck:  Supple, without mass or thyromegaly. Heart:  S1, S2 present without murmurs, rubs, or gallops.   Abdomen:  +BS, soft, mildly tender to palpation lower abdomen.  non-distended. No rebound or guarding. No HSM or masses noted. Msk:  Symmetrical without gross  deformities. Normal posture. Extremities:  Without edema. Neurologic:  Alert and  oriented x4;  grossly normal neurologically. Skin:  Intact without significant lesions or rashes. Cervical Nodes:  No significant cervical adenopathy. Psych:  Alert and cooperative. Normal mood and affect.     Feliberto Gottron  12/01/2010  3:29 PM  Signed Cc to PCP        Rectal pain - Jessica Emperor, NP  12/01/2010  2:24 PM  Signed 75 year old Caucasian female with last colonoscopy in 2007 with few diverticula. She returns today complaining of  rectal pressure for the past 3-4 months. This is worse with sitting. States it "shoots up". She has a BM every day, denies brbpr. Denies any pain with bowel movements. She also has a chronic history of right-sided abdominal pain, which is stable for now. New symptoms of lower abdominal pain described as "constant and pressure". CT findings outlined above. Will proceed with colonoscopy in near future with Dr. Gala Romney for further evaluation of lower GI tract.    Proceed with TCS with Dr. Gala Romney in near future: the risks, benefits, and alternatives have been discussed with the patient in detail. The patient states understanding and desires to proceed.   I have seen the patient prior to the procedure(s) today and reviewed the history and physical / consultation from 11/29/10.  There have been no changes. After consideration of the risks, benefits, alternatives and imponderables, the patient has consented to the procedure(s).

## 2010-12-20 ENCOUNTER — Encounter (HOSPITAL_COMMUNITY): Payer: Self-pay | Admitting: Internal Medicine

## 2010-12-20 ENCOUNTER — Encounter: Payer: Self-pay | Admitting: Internal Medicine

## 2010-12-20 ENCOUNTER — Ambulatory Visit (HOSPITAL_COMMUNITY): Payer: Medicare Other

## 2010-12-22 ENCOUNTER — Telehealth: Payer: Self-pay

## 2010-12-22 LAB — COMPREHENSIVE METABOLIC PANEL
ALT: 16
AST: 20
Albumin: 4.1
CO2: 30
Calcium: 10.1
Chloride: 107
GFR calc Af Amer: 58 — ABNORMAL LOW
GFR calc non Af Amer: 48 — ABNORMAL LOW
Sodium: 140

## 2010-12-22 LAB — URINALYSIS, ROUTINE W REFLEX MICROSCOPIC
Bilirubin Urine: NEGATIVE
Glucose, UA: NEGATIVE
Hgb urine dipstick: NEGATIVE
Ketones, ur: NEGATIVE
pH: 6.5

## 2010-12-22 LAB — DIFFERENTIAL
Eosinophils Absolute: 0.1
Eosinophils Relative: 3
Lymphs Abs: 1.4
Monocytes Absolute: 0.3
Monocytes Relative: 8

## 2010-12-22 LAB — CBC
MCHC: 34.7
Platelets: 221
RBC: 4.84
WBC: 4.2

## 2010-12-22 LAB — LIPASE, BLOOD: Lipase: 29

## 2010-12-22 NOTE — Telephone Encounter (Signed)
Pt called- left voicemail- she is still having a lot of rectal pain. Nitro gives her bad headaches. Wants to know if she can have some suppositories that she can use to help. Last BM was this morning and it was normal and has not noticed any blood in her stool. Uses [employer]. Please advise.

## 2010-12-22 NOTE — Telephone Encounter (Signed)
Hold off on nitro for now.  May use anusol cream as already prescribed.   F/U in office.

## 2010-12-22 NOTE — Telephone Encounter (Signed)
Pt aware, please schedule ov

## 2010-12-26 NOTE — Telephone Encounter (Signed)
Pt is aware of OV for 10/2 at 0800 with AS

## 2010-12-27 ENCOUNTER — Ambulatory Visit (INDEPENDENT_AMBULATORY_CARE_PROVIDER_SITE_OTHER): Payer: Medicare Other | Admitting: Gastroenterology

## 2010-12-27 ENCOUNTER — Encounter: Payer: Self-pay | Admitting: Gastroenterology

## 2010-12-27 VITALS — BP 139/84 | HR 64 | Temp 97.4°F | Ht 64.0 in | Wt 149.2 lb

## 2010-12-27 DIAGNOSIS — K5900 Constipation, unspecified: Secondary | ICD-10-CM | POA: Insufficient documentation

## 2010-12-27 DIAGNOSIS — K6289 Other specified diseases of anus and rectum: Secondary | ICD-10-CM

## 2010-12-27 MED ORDER — ALIGN 4 MG PO CAPS: 1.0000 | ORAL_CAPSULE | ORAL | Status: DC

## 2010-12-27 NOTE — Assessment & Plan Note (Signed)
Improved. Status post colonoscopy Sept 2012, findings overall benign. Hyperplastic polyps. Query anal fissure? Treated with nitroglycerin but unable to tolerate, has since stopped. Continued anusol cream with good results. Will continue cream X 1 week, then stop.   3 mos f/u.

## 2010-12-27 NOTE — Assessment & Plan Note (Signed)
Lower abdominal discomfort, please see last notes regarding full work-up to include CT scan, overall benign. Colonoscopy up-to-date. Complains of "clunky" stool, feels hard ball when inserts fingers. Not eating high fiber diet or on probiotic. Likely this is the source of her discomfort. Will institute fiber supplement daily, high fiber diet, Probiotic daily. Align sent to pharmacy as well as #2 boxes of samples provided to pt. Return in 3 mos. Contact us if no improvement in interim.

## 2010-12-27 NOTE — Progress Notes (Signed)
Cc to PCP 

## 2010-12-27 NOTE — Patient Instructions (Signed)
Start taking a probiotic daily. (You have been given samples of Align, and this was also sent to your pharmacy).  Start benefiber daily for supplemental fiber. See high fiber diet handout as well.   Continue Anusol cream for 1 week.   We will see you back in 3 months or sooner if needed.

## 2010-12-27 NOTE — Progress Notes (Signed)
Referring Provider: Alonza Bogus, MD Primary Care Physician:  Alonza Bogus, MD Primary Gastroenterologist: Dr. Gala Romney   Chief Complaint  Patient presents with  . Rectal Pain    HPI:   Ms. Beld returns today in follow-up after colonoscopy. She has had issues with rectal discomfort. Hyperplastic polyps found, left-sided diverticulosis, query anal fissure. She was treated with nitrogylcerin ointment and anusol cream. Ointment gave headache. She has since stopped per our recommendations. She is continuing the anusol cream. Reports improvement of rectal discomfort. Main complaint is of lower abdominal discomfort, has seen Derrek Monaco (GYN). Given Luvena for vaginal issues. States BM is "clunky", hard. Breaks off in clunks. Not taking supplemental fiber, following high fiber diet, or taking a probiotic. States when inserts fingers, feels a "hard ball" in rectal area.   Past Medical History  Diagnosis Date  . Thyroid disease   . Hypertension   . COPD (chronic obstructive pulmonary disease)   . Colitis due to Clostridium difficile     1994  . ASCVD (arteriosclerotic cardiovascular disease)   . Hyperlipidemia   . Depression   . Anxiety   . GERD (gastroesophageal reflux disease)   . Asthma   . Hypothyroidism   . S/P endoscopy 2009    linear esophageal erosions  . S/P colonoscopy 2007    few diverticula, otherwise nl  . S/P endoscopy 2012    Question island of salmon-colored epithelium in distal esophagus ; no Barrett's.   . S/P colonoscopy Sept 2012    rectal, cecal polyp, left-sided diverticulosis, hyperplastic. ?anal fissure? treated empirically    Past Surgical History  Procedure Date  . Abdominal hysterectomy   . Cholecystectomy   . Breast lumpectomy   . Cataract extraction   . Nissen fundoplication   . Colonoscopy 12/13/2010    Procedure: COLONOSCOPY;  Surgeon: Daneil Dolin, MD;  Location: [facility];  Service: Endoscopy;  Laterality: N/A;  8:30     Current Outpatient Prescriptions  Medication Sig Dispense Refill  . ALPRAZolam (XANAX) 0.5 MG tablet Take 0.5 mg by mouth 3 (three) times daily.        Marland Kitchen aspirin 81 MG tablet Take 81 mg by mouth daily.        Marland Kitchen atorvastatin (LIPITOR) 10 MG tablet Take 10 mg by mouth daily.        . fish oil-omega-3 fatty acids 1000 MG capsule Take 2 g by mouth daily.        . hydrochlorothiazide 25 MG tablet Take 12.5 mg by mouth daily.       Marland Kitchen HYDROcodone-acetaminophen (VICODIN) 5-500 MG per tablet Take 1 tablet by mouth every 4 (four) hours as needed. For pain      . levothyroxine (SYNTHROID, LEVOTHROID) 75 MCG tablet Take 75 mcg by mouth daily.        . Multiple Vitamins-Minerals (MULTI FOR HER 50+) TABS Take 1 tablet by mouth daily.        . multivitamin-lutein (OCUVITE-LUTEIN) CAPS Take 1 capsule by mouth daily.        . nicotine (NICODERM CQ - DOSED IN MG/24 HOURS) 21 mg/24hr patch Place 1 patch onto the skin daily.        Marland Kitchen omeprazole (PRILOSEC) 40 MG capsule Take 40 mg by mouth daily.       . potassium chloride SA (K-DUR,KLOR-CON) 20 MEQ tablet Take 20 mEq by mouth daily.       . verapamil (VERELAN PM) 360 MG 24 hr capsule Take 360 mg  by mouth at bedtime.        . Verapamil HCl CR 300 MG CP24 Take 300 mg by mouth at bedtime.       Marland Kitchen atorvastatin (LIPITOR) 20 MG tablet       . ciprofloxacin (CIPRO) 250 MG tablet Take 250 mg by mouth 2 (two) times daily.                Allergies as of 12/27/2010 - Review Complete 12/27/2010  Allergen Reaction Noted  . Nalbuphine    . Paroxetine    . Venlafaxine    . Hydrocodone-acetaminophen Nausea Only     Family History  Problem Relation Age of Onset  . Colon cancer Neg Hx     History   Social History  . Marital Status: Married    Spouse Name: N/A    Number of Children: N/A  . Years of Education: N/A   Social History Main Topics  . Smoking status: Former Smoker    Quit date: 09/15/2010  . Smokeless tobacco: None  . Alcohol Use: No  . Drug  Use: No  . Sexually Active: Not Currently   Other Topics Concern  . None   Social History Narrative  . None    Review of Systems: Gen: Denies fever, chills, anorexia. Denies fatigue, weakness, weight loss.  CV: Denies chest pain, palpitations, syncope, peripheral edema, and claudication. Resp: Denies dyspnea at rest, cough, wheezing, coughing up blood, and pleurisy. GI: Denies vomiting blood, jaundice, and fecal incontinence.   Denies dysphagia or odynophagia. Derm: Denies rash, itching, dry skin Psych: Denies depression, anxiety, memory loss, confusion. No homicidal or suicidal ideation.  Heme: Denies bruising, bleeding, and enlarged lymph nodes.  Physical Exam: BP 139/84  Pulse 64  Temp(Src) 97.4 F (36.3 C) (Temporal)  Ht 5\' 4"  (1.626 m)  Wt 149 lb 3.2 oz (67.677 kg)  BMI 25.61 kg/m2 General:   Alert and oriented. No distress noted. Pleasant and cooperative.  Head:  Normocephalic and atraumatic. Eyes:  Conjuctiva clear without scleral icterus. Mouth:  Oral mucosa pink and moist. Dentures. No lesions.  Heart:  S1, S2 present without murmurs, rubs, or gallops. Regular rate and rhythm. Abdomen:  +BS, soft, non-tender and non-distended. No rebound or guarding. No HSM or masses noted. Msk:  Symmetrical without gross deformities. Normal posture. Extremities:  Without edema. Neurologic:  Alert and  oriented x4;  grossly normal neurologically. Skin:  Intact without significant lesions or rashes. Psych:  Alert and cooperative. Normal mood and affect.

## 2010-12-29 NOTE — Telephone Encounter (Signed)
This encounter was created in error - please disregard.

## 2011-01-03 ENCOUNTER — Telehealth: Payer: Self-pay | Admitting: Internal Medicine

## 2011-01-03 NOTE — Telephone Encounter (Signed)
Needs to limit narcotics. Can worsen constipation. How is rectal pain today? Does she want to restart the nitro as previously prescribed? Making sure she uses gloves with application

## 2011-01-03 NOTE — Telephone Encounter (Signed)
Pt called this morning, crying/hysterical, demanding OV this morning. She said her "bottom end" was hurting her and oxycodone was not giving her relief and her PCP was out of office. I told her that we did not have any openings at the moment and if she was hurting that bad she should go to the emergency room. She doesn't think the ER can do anything for her. I told pt that I would relay message to RMR's nurse and see what he would advise. Please call pt at [phone]

## 2011-01-03 NOTE — Telephone Encounter (Signed)
Routed to AS

## 2011-01-03 NOTE — Telephone Encounter (Signed)
Pt aware, she said the nitro gave her migraine type headaches.   Per AS- we need to get pt an appt soon. Please schedule.

## 2011-01-04 NOTE — Telephone Encounter (Signed)
Pt is aware of OV on 10/12 with KJ

## 2011-01-04 NOTE — Telephone Encounter (Signed)
Tried calling pt, but no answer or voicemail

## 2011-01-04 NOTE — Telephone Encounter (Signed)
I called again with no answer, but did LMOM for a return call

## 2011-01-06 ENCOUNTER — Encounter: Payer: Self-pay | Admitting: Urgent Care

## 2011-01-06 ENCOUNTER — Ambulatory Visit (INDEPENDENT_AMBULATORY_CARE_PROVIDER_SITE_OTHER): Payer: Medicare Other | Admitting: Urgent Care

## 2011-01-06 VITALS — BP 138/80 | HR 69 | Temp 97.6°F | Ht 61.0 in | Wt 149.6 lb

## 2011-01-06 DIAGNOSIS — R1084 Generalized abdominal pain: Secondary | ICD-10-CM

## 2011-01-06 NOTE — Progress Notes (Signed)
Referring Provider: Alonza Bogus, MD Primary Care Physician:  Alonza Bogus, MD Primary Gastroenterologist:  Dr. Gala Romney  Chief Complaint  Patient presents with  . Follow-up    HPI:  [name] is a 75 y.o. female here for follow up for chronic abdominal & rectal pain.  Felt to have possible fissure in ano.  Last seen by Laban Emperor, NP 12/27/10.  Tells me she awakens pain free.  As she begins to move around, she feels a "pulling on her insides & intestines" & a severe pain that radiates all the way to her lower abdomen.  Worse w/ moving around.   C/o burning aching/pain.  Taking hydrocodone 500mg  1-2 daily 3 weeks.  Pain all day long.  Everyday x 4 months.  Proctalgia better,  BM daily in AM.  C/o nausea, denies vomiting.  Feels nausea may be secondary to pain pills.    8/912 CT abd/pelvis w/ IV contrast-> Mild bile duct prominence felt to be related to patient's age and post cholecystectomy state and unchanged. Elongated right lobe liver.  Mild adrenal gland hyperplasia unchanged. Bilateral renal lesions with larger lesions which appear to be cysts. Some smaller lesions too small to  adequately characterize. Mild fullness right renal collecting system unchanged. Atherosclerotic type changes of the aorta and branch vessels. No  abdominal aortic aneurysm. Mild to moderate narrowing of the proximal aspect of the celiac artery and superior mesenteric artery. Mild to moderate narrowing common iliac arteries.   Past Medical History  Diagnosis Date  . Thyroid disease   . Hypertension   . COPD (chronic obstructive pulmonary disease)   . Colitis due to Clostridium difficile     1994  . ASCVD (arteriosclerotic cardiovascular disease)   . Hyperlipidemia   . Depression   . Anxiety   . GERD (gastroesophageal reflux disease)   . Asthma   . Hypothyroidism   . S/P endoscopy 2009    linear esophageal erosions  . S/P colonoscopy 2007    few diverticula, otherwise nl  . S/P endoscopy 2012   Question island of salmon-colored epithelium in distal esophagus ; no Barrett's.   . S/P colonoscopy Sept 2012    rectal, cecal polyp, left-sided diverticulosis, hyperplastic. ?anal fissure? treated empirically    Past Surgical History  Procedure Date  . Abdominal hysterectomy   . Cholecystectomy   . Breast lumpectomy   . Cataract extraction   . Nissen fundoplication   . Colonoscopy 12/13/2010    Procedure: COLONOSCOPY;  Surgeon: Daneil Dolin, MD;  Location: [facility];  Service: Endoscopy;  Laterality: N/A;  8:30    Current Outpatient Prescriptions  Medication Sig Dispense Refill  . ALPRAZolam (XANAX) 0.5 MG tablet Take 0.5 mg by mouth 3 (three) times daily.        Marland Kitchen aspirin 81 MG tablet Take 81 mg by mouth daily.        Marland Kitchen atorvastatin (LIPITOR) 10 MG tablet Take 10 mg by mouth daily.        Marland Kitchen atorvastatin (LIPITOR) 20 MG tablet       . ciprofloxacin (CIPRO) 250 MG tablet Take 250 mg by mouth 2 (two) times daily.        . fish oil-omega-3 fatty acids 1000 MG capsule Take 2 g by mouth daily.        . hydrochlorothiazide 25 MG tablet Take 12.5 mg by mouth daily.       Marland Kitchen HYDROcodone-acetaminophen (VICODIN) 5-500 MG per tablet Take 1 tablet by  mouth every 4 (four) hours as needed. For pain      . levothyroxine (SYNTHROID, LEVOTHROID) 75 MCG tablet Take 75 mcg by mouth daily.        . Multiple Vitamins-Minerals (MULTI FOR HER 50+) TABS Take 1 tablet by mouth daily.        . multivitamin-lutein (OCUVITE-LUTEIN) CAPS Take 1 capsule by mouth daily.        . nicotine (NICODERM CQ - DOSED IN MG/24 HOURS) 21 mg/24hr patch Place 1 patch onto the skin daily.        Marland Kitchen omeprazole (PRILOSEC) 40 MG capsule Take 40 mg by mouth daily.       . potassium chloride SA (K-DUR,KLOR-CON) 20 MEQ tablet Take 20 mEq by mouth daily.       . Probiotic Product (ALIGN) 4 MG CAPS Take 1 capsule by mouth 1 day or 1 dose.  30 capsule  5  . verapamil (VERELAN PM) 360 MG 24 hr capsule Take 360 mg by mouth at  bedtime.        . Verapamil HCl CR 300 MG CP24 Take 300 mg by mouth at bedtime.         Allergies as of 01/06/2011 - Review Complete 01/06/2011  Allergen Reaction Noted  . Paroxetine Itching   . Venlafaxine Itching   . Hydrocodone-acetaminophen Nausea Only   . Nalbuphine Rash     Review of Systems: Review of Systems: Gen: Denies any fever, chills, sweats, anorexia, fatigue, weakness, malaise, weight loss, and sleep disorder CV: Denies chest pain, angina, palpitations, syncope, orthopnea, PND, peripheral edema, and claudication. Resp: Denies dyspnea at rest, dyspnea with exercise, cough, sputum, wheezing, coughing up blood, and pleurisy. GI: Denies vomiting blood, jaundice, and fecal incontinence.   Denies dysphagia or odynophagia. [id number] increased urinary frequency, hesitancy, dysuria, hematuria. Derm: Denies rash, itching, dry skin, hives, moles, warts, or unhealing ulcers.  Psych: Denies depression, anxiety, memory loss, suicidal ideation, hallucinations, paranoia, and confusion. Heme: Denies bruising, bleeding, and enlarged lymph nodes.  Physical Exam: BP 138/80  Pulse 69  Temp(Src) 97.6 F (36.4 C) (Temporal)  Ht 5\' 1"  (1.549 m)  Wt 149 lb 9.6 oz (67.858 kg)  BMI 28.27 kg/m2 General:   Alert,  Well-developed, well-nourished, pleasant and cooperative in NAD Head:  Normocephalic and atraumatic. Eyes:  Sclera clear, no icterus.   Conjunctiva pink. Mouth:  No deformity or lesions, OP pink/moist. Neck:  Supple; no masses or thyromegaly. Heart:  Regular rate and rhythm; no murmurs, clicks, rubs,  or gallops. Abdomen:  Soft, nontender and nondistended. No masses, hepatosplenomegaly or hernias noted. Normal bowel sounds, without guarding, and without rebound.   Msk:  Symmetrical without gross deformities. Normal posture. Pulses:  Normal pulses noted. Extremities:  Without clubbing or edema. Neurologic:  Alert and  oriented x4;  grossly normal neurologically. Skin:  Intact  without significant lesions or rashes. Cervical Nodes:  No significant cervical adenopathy. Psych:  Alert and cooperative. Normal mood and affect.

## 2011-01-06 NOTE — Assessment & Plan Note (Addendum)
[name] is a 75 y.o. female with chronic abdominal pain.  Proctalgia from fissure in ano has subsided, but persistent abdominal pain.  Given CT findings, we should look for mesenteric ischemia with CTA abd/pelvis.   Otherwise, CT looked benign without anything to attribute this abdominal pain.  Further recommendations pending CTA. Go directly to ER if severe pain

## 2011-01-06 NOTE — Patient Instructions (Signed)
Go directly to ER if severe pain I will call you with CT angiogram results Abdominal Pain Abdominal pain can be caused by many things. Your caregiver decides the seriousness of your pain by an examination and possibly blood tests and X-rays. Many cases can be observed and treated at home. Most abdominal pain is not caused by a disease and will probably improve without treatment. However, in many cases, more time must pass before a clear cause of the pain can be found. Before that point, it may not be known if you need more testing, or if hospitalization or surgery is needed. HOME CARE INSTRUCTIONS  Do not take laxatives unless directed by your caregiver.   Take pain medicine only as directed by your caregiver.   Only take over-the-counter or prescription medicines for pain, discomfort, or fever as directed by your caregiver.   Try a clear liquid diet (broth, tea, or water) for 2 days or as ordered by your caregiver. Slowly move to a bland diet as tolerated.  SEEK IMMEDIATE MEDICAL CARE IF:  The pain does not go away.   You or your child has an oral temperature above 101, not controlled by medicine.   You keep throwing up (vomiting).   The pain is felt only in portions of the abdomen. Pain in the right side could possibly be appendicitis. In an adult, pain in the left lower portion of the abdomen could be colitis or diverticulitis.   You pass bloody or black tarry stools.  MAKE SURE YOU:  Understand these instructions.   Will watch your condition.   Will get help right away if you are not doing well or get worse.  Document Released: 12/21/2004 Document Re-Released: 06/07/2009 [facility] Patient Information 2011 [place].

## 2011-01-09 NOTE — Progress Notes (Signed)
Cc to PCP 

## 2011-01-09 NOTE — Progress Notes (Signed)
Quick Note:  Please call pt & radiology to cancel CTA-her creatinine is too high. I will discuss w/ RMR & let her know next step. She needs OV w/ HAWKINS,EDWARD L, MD to discuss her elevated creatinine. Thanks  ______

## 2011-01-10 ENCOUNTER — Ambulatory Visit (HOSPITAL_COMMUNITY): Payer: Medicare Other

## 2011-01-10 NOTE — Progress Notes (Signed)
Quick Note:  Discussed w/ RMR. Will discuss possible MRI imaging w/ radiologist to get a better look at mesentery.  ______

## 2011-01-11 NOTE — Progress Notes (Signed)
Pt rescheduled for 10/19 @ 10:15- She is aware

## 2011-01-11 NOTE — Progress Notes (Signed)
Quick Note:  Please call pt. I spoke w/ radiologist Dr Thornton Papas & CT tech Anderson Malta to determine best imaging. Dr Thornton Papas suggest CTA despite her mildly elevated creatinine. They will modify her contrast so she may have CTA abd/pelvis w/ IV contrast. She needs to drink PLENTY of fluids for 2-3 days prior to the CT. Please arrange. Thanks ______

## 2011-01-12 ENCOUNTER — Telehealth: Payer: Self-pay | Admitting: Gastroenterology

## 2011-01-12 NOTE — Telephone Encounter (Signed)
Had to R/S pts CTA- Insurance would not approve without talking to practioner- sent message to [facility] requesting she call- will R/S once Josem Kaufmann is given- Pt is aware and [place] with this

## 2011-01-13 ENCOUNTER — Ambulatory Visit (HOSPITAL_COMMUNITY)
Admission: RE | Admit: 2011-01-13 | Discharge: 2011-01-13 | Disposition: A | Discharge status: Home / Self Care | Payer: Medicare Other | Source: Ambulatory Visit | Attending: Urgent Care | Admitting: Urgent Care

## 2011-01-13 ENCOUNTER — Ambulatory Visit (HOSPITAL_COMMUNITY): Payer: Medicare Other

## 2011-01-13 DIAGNOSIS — R109 Unspecified abdominal pain: Secondary | ICD-10-CM | POA: Insufficient documentation

## 2011-01-13 DIAGNOSIS — R1084 Generalized abdominal pain: Secondary | ICD-10-CM

## 2011-01-13 DIAGNOSIS — K5730 Diverticulosis of large intestine without perforation or abscess without bleeding: Secondary | ICD-10-CM | POA: Insufficient documentation

## 2011-01-13 MED ORDER — IOHEXOL 350 MG/ML SOLN
100.0000 mL | Freq: Once | INTRAVENOUS | Status: AC | PRN
  Administered 2011-01-13: 100 mL via INTRAVENOUS

## 2011-01-13 NOTE — Progress Notes (Signed)
Quick Note:  Please call pt. CTA looks ok. Nothing to explain abdominal pain. Need progress report from pt. Thanks ______

## 2011-01-16 NOTE — Progress Notes (Signed)
Quick Note:  Pt needs OV w/ RMR only please ______

## 2011-01-17 ENCOUNTER — Telehealth: Payer: Self-pay | Admitting: Gastroenterology

## 2011-01-17 NOTE — Telephone Encounter (Signed)
She can take 2 Dulcolax. Can use Fleet enema if no relief.

## 2011-01-17 NOTE — Telephone Encounter (Signed)
Pt called- wants to know what kind of laxative she can take to "clean her bowels out" please call

## 2011-01-17 NOTE — Telephone Encounter (Signed)
Spoke with pt, she said stool softeners and miralax dont work for her. She wants to know if dulcolax would be ok for her to take.   Her pcp has her on cipro for her bladder and she feels like it makes her constipated. Her las bm was yesterday morning and she said her bowels were sore today.   Please advise.

## 2011-01-17 NOTE — Telephone Encounter (Signed)
Pt aware.

## 2011-01-18 ENCOUNTER — Telehealth: Payer: Self-pay | Admitting: Internal Medicine

## 2011-01-18 NOTE — Telephone Encounter (Signed)
Pt called again today thinking she could possibly have IBS from talking with a family member.  She would like to know the signs and symptoms, what laxatives she can take and foods to eat. I spoke with pt yesterday because I had received referral from [place], but she didn't want to make appointment since she was just seen on 01/06/11. Please return her call at [phone]

## 2011-01-19 NOTE — Telephone Encounter (Signed)
Spoke with pt- she requested info on ibs. Handout in mail to pt.

## 2011-01-23 NOTE — Progress Notes (Signed)
Pt aware of OV on 11/15 at 330 with RMR

## 2011-01-25 ENCOUNTER — Ambulatory Visit (HOSPITAL_COMMUNITY)
Admission: RE | Admit: 2011-01-25 | Discharge: 2011-01-25 | Disposition: A | Discharge status: Home / Self Care | Payer: Medicare Other | Source: Ambulatory Visit | Attending: Pulmonary Disease | Admitting: Pulmonary Disease

## 2011-01-25 ENCOUNTER — Other Ambulatory Visit (HOSPITAL_COMMUNITY): Payer: Self-pay | Admitting: Pulmonary Disease

## 2011-01-25 DIAGNOSIS — N63 Unspecified lump in unspecified breast: Secondary | ICD-10-CM | POA: Insufficient documentation

## 2011-01-25 DIAGNOSIS — N644 Mastodynia: Secondary | ICD-10-CM

## 2011-02-09 ENCOUNTER — Ambulatory Visit: Payer: Medicare Other | Admitting: Internal Medicine

## 2011-02-23 NOTE — Progress Notes (Signed)
REVIEWED.  

## 2011-03-02 ENCOUNTER — Ambulatory Visit: Payer: Medicare Other | Admitting: Cardiology

## 2011-03-09 ENCOUNTER — Encounter: Payer: Self-pay | Admitting: Pulmonary Disease

## 2011-04-11 ENCOUNTER — Telehealth: Payer: Self-pay | Admitting: Cardiology

## 2011-04-11 NOTE — Telephone Encounter (Signed)
PT STATES THAT WHEN SHE IS ON THE TREADMILL HER HR IS 179 JUST WALKING. PLUS SHE HAS BEEN DIZZY, THINKS IT MAY BE MEDICATIONS

## 2011-04-11 NOTE — Telephone Encounter (Signed)
Patient will be put on schedule to see provider asap

## 2011-04-12 ENCOUNTER — Encounter: Payer: Self-pay | Admitting: *Deleted

## 2011-04-12 ENCOUNTER — Encounter: Payer: Self-pay | Admitting: Physician Assistant

## 2011-04-12 ENCOUNTER — Ambulatory Visit (INDEPENDENT_AMBULATORY_CARE_PROVIDER_SITE_OTHER): Payer: Medicare Other | Admitting: Physician Assistant

## 2011-04-12 VITALS — BP 182/87 | HR 71 | Resp 18 | Ht 60.0 in | Wt 152.0 lb

## 2011-04-12 DIAGNOSIS — R000 Tachycardia, unspecified: Secondary | ICD-10-CM

## 2011-04-12 DIAGNOSIS — J449 Chronic obstructive pulmonary disease, unspecified: Secondary | ICD-10-CM

## 2011-04-12 DIAGNOSIS — I10 Essential (primary) hypertension: Secondary | ICD-10-CM

## 2011-04-12 NOTE — Assessment & Plan Note (Signed)
Patient states her heart rate jumped up to 179 while walking on treadmill recently. She denied symptoms with this. She has a history of nonobstructive coronary artery disease dating back to 2004. We will schedule her for a stress Myoview to rule out arrhythmia or ischemia.

## 2011-04-12 NOTE — Assessment & Plan Note (Signed)
Patient quit smoking 

## 2011-04-12 NOTE — Patient Instructions (Addendum)
Your physician recommends that you start a low salt diet   Stress Myoview  Your physician recommends that you see Mental health  Your physician recommends that you schedule a follow-up appointment in: 1 month

## 2011-04-12 NOTE — Progress Notes (Signed)
HPI:  This is a 76 year old white female patient who comes in today because her heart rate jumped up to 179 when she was walking in on a treadmill at the [facility]. She got another treadmill and it registered at 179. She states she just started walking and did not feel her heart racing and felt like she could walk for a long time. She has never had this issue before. She does have a history of nonobstructive coronary artery disease on catheter in 2004. Last stress test was in 2006.  The patient is tearful and has multiple medical problems including anxiety depression and uncontrolled hypertension. Dr. Georgiann Cocker has tried multiple agents for controlling her blood pressure but she claims she is allergic to many of them. She doesn't like taking the hydrochlorothiazide which was prescribed 2 months ago and she claims causes dizziness. She has significant anxiety that revolves around her husband and her mother who passed away 2 years ago but she cared for for 10 years. Behavioral health has been recommended in the past but she has not followed through.  The patient did quit smoking 7 months ago which I commend her for. She denies chest pain, palpitations, dyspnea, dyspnea on exertion, or syncope.   Allergies  Allergen Reactions  . Paroxetine Itching  . Venlafaxine Itching  . Hydrocodone-Acetaminophen Nausea Only  . Nalbuphine Rash    Current Outpatient Prescriptions on File Prior to Visit  Medication Sig Dispense Refill  . ALPRAZolam (XANAX) 0.5 MG tablet Take 0.5 mg by mouth 3 (three) times daily.        Marland Kitchen aspirin 81 MG tablet Take 81 mg by mouth as needed.       Marland Kitchen atorvastatin (LIPITOR) 10 MG tablet Take 10 mg by mouth daily.        . fish oil-omega-3 fatty acids 1000 MG capsule Take 2 g by mouth daily.        . hydrochlorothiazide 25 MG tablet Take 12.5 mg by mouth daily.       Marland Kitchen HYDROcodone-acetaminophen (VICODIN) 5-500 MG per tablet Take 1 tablet by mouth every 4 (four) hours as needed. For pain        . levothyroxine (SYNTHROID, LEVOTHROID) 75 MCG tablet Take 75 mcg by mouth daily.        . multivitamin-lutein (OCUVITE-LUTEIN) CAPS Take 1 capsule by mouth daily.        Marland Kitchen omeprazole (PRILOSEC) 40 MG capsule Take 40 mg by mouth daily.       . Probiotic Product (ALIGN) 4 MG CAPS Take 1 capsule by mouth 1 day or 1 dose.  30 capsule  5  . Verapamil HCl CR 300 MG CP24 Take 300 mg by mouth at bedtime.         Past Medical History  Diagnosis Date  . Thyroid disease   . Hypertension   . COPD (chronic obstructive pulmonary disease)   . Colitis due to Clostridium difficile     1994  . ASCVD (arteriosclerotic cardiovascular disease)   . Hyperlipidemia   . Depression   . Anxiety   . GERD (gastroesophageal reflux disease)   . Asthma   . Hypothyroidism   . S/P endoscopy 2009    linear esophageal erosions  . S/P colonoscopy 2007    few diverticula, otherwise nl  . S/P endoscopy 05/10/10    Question island of salmon-colored epithelium in distal esophagus ; no Barrett's.   . S/P colonoscopy 12/13/10    rectal, cecal polyp, left-sided diverticulosis, hyperplastic. ?anal  fissure? treated empirically    Past Surgical History  Procedure Date  . Abdominal hysterectomy   . Cholecystectomy   . Breast lumpectomy   . Cataract extraction   . Nissen fundoplication   . Colonoscopy 12/13/2010    Procedure: COLONOSCOPY;  Surgeon: Daneil Dolin, MD;  Location: [facility];  Service: Endoscopy;  Laterality: N/A;  8:30  . Esophagogastroduodenoscopy 05/10/10    benign mucosa with mild chronic inflammation.    Family History  Problem Relation Age of Onset  . Colon cancer Neg Hx     History   Social History  . Marital Status: Married    Spouse Name: N/A    Number of Children: N/A  . Years of Education: N/A   Occupational History  . Not on file.   Social History Main Topics  . Smoking status: Former Smoker    Quit date: 09/15/2010  . Smokeless tobacco: Not on file  . Alcohol Use: No  .  Drug Use: No  . Sexually Active: Not Currently   Other Topics Concern  . Not on file   Social History Narrative  . No narrative on file    ROS: See HPI Eyes: Negative Ears:Negative for hearing loss, tinnitus Cardiovascular: Negative for chest pain, palpitations,irregular heartbeat, dyspnea, dyspnea on exertion, near-syncope, orthopnea, paroxysmal nocturnal dyspnia and syncope,edema, claudication, cyanosis,.  Respiratory:   Negative for cough, hemoptysis, shortness of breath, sleep disturbances due to breathing, sputum production and wheezing.   Endocrine: Negative for cold intolerance and heat intolerance.  Hematologic/Lymphatic: Negative for adenopathy and bleeding problem. Does not bruise/bleed easily.  Musculoskeletal:arthralgias.   Gastrointestinal: chronic lower abdominal pain.  Neurological: significant anxiety and depression.  Allergic/Immunologic: Negative for environmental allergies.   PHYSICAL EXAM: Well-nournished, emotional,in no acute distress. Neck: No JVD, HJR, Bruit, or thyroid enlargement Lungs: decreased breath sounds throughout, No tachypnea, clear without wheezing, rales, or rhonchi Cardiovascular: RRR, PMI not displaced,positive S4 and [id number] systolic ejection murmur at the left sternal border,no bruit, thrill, or heave. Abdomen: BS normal. Soft without organomegaly, masses, lesions or tenderness. Extremities: without cyanosis, clubbing or edema. Good distal pulses bilateral SKin: Warm, no lesions or rashes  Musculoskeletal: No deformities Neuro: no focal signs  BP 182/87  Pulse 71  Resp 18  Ht 5' (1.524 m)  Wt 152 lb (68.947 kg)  BMI 29.69 kg/m2  [id number] sinus rhythm no acute change

## 2011-04-12 NOTE — Assessment & Plan Note (Signed)
Patient's blood pressure has been difficult to control over the years. She does bring a list of blood pressures from home that are normal. I believe a lot of her hypertension is related to her anxiety. She is very emotional. I've asked her to start on a 2 g sodium diet and seek behavioral health help. I asked her to discuss stopping hydrochlorothiazide with Dr. Luan Pulling who prescribed it.

## 2011-04-19 ENCOUNTER — Encounter (HOSPITAL_COMMUNITY)
Admission: RE | Admit: 2011-04-19 | Discharge: 2011-04-19 | Disposition: A | Discharge status: Home / Self Care | Payer: Medicare Other | Source: Ambulatory Visit | Attending: Physician Assistant | Admitting: Physician Assistant

## 2011-04-19 ENCOUNTER — Ambulatory Visit (INDEPENDENT_AMBULATORY_CARE_PROVIDER_SITE_OTHER): Payer: Medicare Other | Admitting: *Deleted

## 2011-04-19 ENCOUNTER — Encounter (HOSPITAL_COMMUNITY): Payer: Self-pay | Admitting: Cardiology

## 2011-04-19 ENCOUNTER — Encounter (HOSPITAL_COMMUNITY): Payer: Self-pay

## 2011-04-19 DIAGNOSIS — I2510 Atherosclerotic heart disease of native coronary artery without angina pectoris: Secondary | ICD-10-CM

## 2011-04-19 DIAGNOSIS — I10 Essential (primary) hypertension: Secondary | ICD-10-CM | POA: Insufficient documentation

## 2011-04-19 DIAGNOSIS — R000 Tachycardia, unspecified: Secondary | ICD-10-CM

## 2011-04-19 DIAGNOSIS — R002 Palpitations: Secondary | ICD-10-CM

## 2011-04-19 MED ORDER — TECHNETIUM TC 99M TETROFOSMIN IV KIT
30.0000 | PACK | Freq: Once | INTRAVENOUS | Status: AC | PRN
  Administered 2011-04-19: 30 via INTRAVENOUS

## 2011-04-19 MED ORDER — TECHNETIUM TC 99M TETROFOSMIN IV KIT
10.0000 | PACK | Freq: Once | INTRAVENOUS | Status: AC | PRN
  Administered 2011-04-19: 10 via INTRAVENOUS

## 2011-04-19 NOTE — Progress Notes (Signed)
Stress Lab Nurses Notes - [name]  [name] 04/19/2011  Reason for doing test: Tachycardia Type of test: Stress Myoview Nurse performing test: Gerrit Halls, RN Nuclear Medicine Tech: Melburn Hake Echo Tech: Not Applicable MD performing test: Myles Gip  & Jory Sims NP Family MD: Luan Pulling Test explained and consent signed: yes IV started: 22g jelco, Saline lock flushed, No redness or edema and Saline lock started in radiology Symptoms: Fatigue & SOB Treatment/Intervention: None Reason test stopped: fatigue and reached target HR After recovery IV was: Discontinued via X-ray tech and No redness or edema Patient to return to [place]. Med at : 12:00 N Patient discharged: Home Patient's Condition upon discharge was: stable Comments: During test peak BP 200/82 & HR 141.  Recovery BP 152/80 & HR 68.  Symptoms resolved in recovery. Geanie Cooley T

## 2011-04-25 ENCOUNTER — Telehealth: Payer: Self-pay | Admitting: *Deleted

## 2011-04-25 ENCOUNTER — Telehealth: Payer: Self-pay | Admitting: Cardiology

## 2011-04-25 NOTE — Telephone Encounter (Signed)
Results of stress test called to patient

## 2011-04-25 NOTE — Telephone Encounter (Signed)
Pt had stress test last week and would like results/tmj

## 2011-04-25 NOTE — Telephone Encounter (Signed)
I agree with providing a note permitting the patient to resume exercise at the [facility].

## 2011-04-26 ENCOUNTER — Encounter: Payer: Self-pay | Admitting: *Deleted

## 2011-05-01 ENCOUNTER — Encounter: Payer: Self-pay | Admitting: Cardiology

## 2011-05-01 DIAGNOSIS — F418 Other specified anxiety disorders: Secondary | ICD-10-CM | POA: Insufficient documentation

## 2011-05-01 DIAGNOSIS — K219 Gastro-esophageal reflux disease without esophagitis: Secondary | ICD-10-CM | POA: Insufficient documentation

## 2011-05-01 DIAGNOSIS — I10 Essential (primary) hypertension: Secondary | ICD-10-CM | POA: Insufficient documentation

## 2011-05-01 DIAGNOSIS — E785 Hyperlipidemia, unspecified: Secondary | ICD-10-CM | POA: Insufficient documentation

## 2011-05-01 DIAGNOSIS — I2510 Atherosclerotic heart disease of native coronary artery without angina pectoris: Secondary | ICD-10-CM | POA: Insufficient documentation

## 2011-05-02 ENCOUNTER — Ambulatory Visit: Payer: Medicare Other | Admitting: Cardiology

## 2011-05-04 ENCOUNTER — Telehealth: Payer: Self-pay | Admitting: Cardiology

## 2011-05-04 NOTE — Telephone Encounter (Signed)
PT NEEDS TO KNOW WHEN TO FOLLOW UP AGAIN. LAST OV STATES 34M BUT SHE HAD STRESS TEST AND RESULTS CALLED BACK TO HER.

## 2011-05-08 ENCOUNTER — Encounter: Payer: Self-pay | Admitting: Adult Health

## 2011-05-08 ENCOUNTER — Ambulatory Visit (INDEPENDENT_AMBULATORY_CARE_PROVIDER_SITE_OTHER): Payer: Medicare Other | Admitting: Adult Health

## 2011-05-08 DIAGNOSIS — I2510 Atherosclerotic heart disease of native coronary artery without angina pectoris: Secondary | ICD-10-CM

## 2011-05-08 DIAGNOSIS — I10 Essential (primary) hypertension: Secondary | ICD-10-CM

## 2011-05-08 NOTE — Progress Notes (Signed)
HPI: [name] is a 76 y/o patient of Dr. Lattie Haw we are following for ongoing assessment of uncontrolled hypertension, CAD, hypertension, with multiple non-cardiac medical problems.  She comes today with complaints of abdominal pain and questions about her HR during exercise. She has recently been placed on benicar in addition to verapamil. She was taking HCTZ but she stopped this on her own.She states she was having bladder spasms with its use. She is very talkative, anxious and emotional on this visit. This is baseline for her from previous notes. She denies chest pain or shortness of breath.   Allergies  Allergen Reactions  . Paroxetine Itching  . Pravastatin   . Venlafaxine Itching  . Hydrocodone-Acetaminophen Nausea Only  . Nalbuphine Rash    Current Outpatient Prescriptions  Medication Sig Dispense Refill  . ALPRAZolam (XANAX) 0.5 MG tablet Take 0.5 mg by mouth 3 (three) times daily.        Marland Kitchen atorvastatin (LIPITOR) 10 MG tablet Take 10 mg by mouth daily.        . fish oil-omega-3 fatty acids 1000 MG capsule Take 2 g by mouth daily.        Marland Kitchen HYDROcodone-acetaminophen (VICODIN) 5-500 MG per tablet Take 1 tablet by mouth every 4 (four) hours as needed. For pain      . levothyroxine (SYNTHROID, LEVOTHROID) 75 MCG tablet Take 75 mcg by mouth daily.        . multivitamin-lutein (OCUVITE-LUTEIN) CAPS Take 1 capsule by mouth daily.        Marland Kitchen olmesartan (BENICAR) 20 MG tablet Take 20 mg by mouth daily.      Marland Kitchen omeprazole (PRILOSEC) 40 MG capsule Take 40 mg by mouth daily.       . Probiotic Product (ALIGN) 4 MG CAPS Take 1 capsule by mouth 1 day or 1 dose.  30 capsule  5  . Verapamil HCl CR 300 MG CP24 Take 300 mg by mouth at bedtime.         Past Medical History  Diagnosis Date  . Arteriosclerotic cardiovascular disease (ASCVD) 2006    Nonobstructive; < 50% lesions on cath 2002; negative stress nuclear study in 10/2004  . Hypertension   . COPD (chronic obstructive pulmonary disease)    . Colitis due to Clostridium difficile Moran  . Gastroesophageal reflux disease   . Hyperlipidemia   . Depression with anxiety   . Asthma   . Hypothyroidism   . S/P endoscopy 2009    linear esophageal erosions  . S/P colonoscopy 2007    few diverticula, otherwise nl  . S/P endoscopy 05/10/10    Question island of salmon-colored epithelium in distal esophagus ; no Barrett's.   . S/P colonoscopy 12/13/10    rectal, cecal polyp, left-sided diverticulosis, hyperplastic. ?anal fissure? treated empirically    Past Surgical History  Procedure Date  . Abdominal hysterectomy   . Cholecystectomy   . Breast lumpectomy   . Cataract extraction   . Nissen fundoplication   . Colonoscopy 12/13/2010    Procedure: COLONOSCOPY+ polypectomy;  Surgeon: Daneil Dolin, MD;  Location: [facility];  Service: Endoscopy;  Laterality: N/A;  8:30  . Esophagogastroduodenoscopy 05/10/10    benign mucosa with mild chronic inflammation.    [id number] of systems complete and found to be negative unless listed above\ PHYSICAL EXAM BP 151/71  Pulse 77  Resp 18  Ht 5' (1.524 m)  Wt 135 lb (61.236 kg)  BMI 26.37 kg/m2  General:  Well developed, well nourished, in no acute distress Head: Eyes PERRLA, No xanthomas.   Normal cephalic and atramatic  Lungs: Clear bilaterally to auscultation and percussion. Heart: HRRR S1 S2, without MRG.  Pulses are 2+ & equal.            No carotid bruit. No JVD.  No abdominal bruits. No femoral bruits. Abdomen: Bowel sounds are positive, abdomen soft and tender to palpation without masses or                  Hernia's noted. Msk:  Back normal, normal gait. Normal strength and tone for age. Extremities: No clubbing, cyanosis or edema.  DP +1 Neuro: Alert and oriented X 3.  Psych:  Good affect, responds appropriately,anxious and tearful    ASSESSMENT AND PLAN

## 2011-05-08 NOTE — Patient Instructions (Signed)
Your physician recommends that you continue on your current medications as directed. Please refer to the Current Medication list given to you today.  Your physician recommends that you schedule a follow-up appointment in: 6 months  

## 2011-05-08 NOTE — Assessment & Plan Note (Signed)
She is stable from cardiac standpoint. Symptoms do not related to cardiac disease. She will continue current medications without changes. Will see her in 6 months unless symptomatic. I have advised her that her maximum HR should be around 120 bpm.

## 2011-05-08 NOTE — Assessment & Plan Note (Signed)
She insists that her BP is lower at home and she does not need to take the HCTZ. It is lower than when she was here last with the addition of benicar. Recheck has her BP at [id number] systolic. Will monitor this on follow-up appointments.

## 2011-05-09 ENCOUNTER — Encounter: Payer: Self-pay | Admitting: Gastroenterology

## 2011-05-09 ENCOUNTER — Ambulatory Visit (INDEPENDENT_AMBULATORY_CARE_PROVIDER_SITE_OTHER): Payer: Medicare Other | Admitting: Gastroenterology

## 2011-05-09 DIAGNOSIS — R1030 Lower abdominal pain, unspecified: Secondary | ICD-10-CM | POA: Insufficient documentation

## 2011-05-09 DIAGNOSIS — R109 Unspecified abdominal pain: Secondary | ICD-10-CM

## 2011-05-09 DIAGNOSIS — K6289 Other specified diseases of anus and rectum: Secondary | ICD-10-CM

## 2011-05-09 DIAGNOSIS — R300 Dysuria: Secondary | ICD-10-CM | POA: Insufficient documentation

## 2011-05-09 LAB — CBC WITH DIFFERENTIAL/PLATELET
HCT: 37.4 % (ref 36.0–46.0)
Hemoglobin: 12.3 g/dL (ref 12.0–15.0)
Lymphocytes Relative: 36 % (ref 12–46)
Lymphs Abs: 1.9 10*3/uL (ref 0.7–4.0)
Monocytes Relative: 7 % (ref 3–12)
Neutro Abs: 2.8 10*3/uL (ref 1.7–7.7)
Neutrophils Relative %: 53 % (ref 43–77)
RBC: 4.17 MIL/uL (ref 3.87–5.11)

## 2011-05-09 LAB — HEMOCCULT GUIAC POC 1CARD (OFFICE)

## 2011-05-09 LAB — BASIC METABOLIC PANEL
CO2: 24 mEq/L (ref 19–32)
Calcium: 9.8 mg/dL (ref 8.4–10.5)
Chloride: 108 mEq/L (ref 96–112)
Potassium: 4.6 mEq/L (ref 3.5–5.3)
Sodium: 141 mEq/L (ref 135–145)

## 2011-05-09 NOTE — Assessment & Plan Note (Signed)
Discussed with Dr. Gala Romney. Patient complains of excruciating lower abdominal pain which begins daily after gets up and moves around. Really unrelated to bowels or meals. Not alleviated by BM. ?symptoms secondary to cystocele/rectocele. She complains of difficulty urinating and dysuria. Will check for UTI as well. Doubt we are dealing with diverticulitis (smoldering) or abscess but this remains in differential. Dr. Gala Romney has recommended labs, u/a with culture, CT A/P with contrast. Patient is agreeable. If w/u unremarkable, next step may be second opinion at tertiary care center.

## 2011-05-09 NOTE — Progress Notes (Signed)
Faxed to PCP

## 2011-05-09 NOTE — Assessment & Plan Note (Signed)
Cannot exclude minor fissure but really no significant pain on DRE today. I suspect rectal pain is referred from pelvic pain. CT and labs, u/a as planned.

## 2011-05-09 NOTE — Progress Notes (Signed)
Primary Care Physician: Alonza Bogus, MD, MD  Primary Gastroenterologist:  Garfield Cornea, MD   Chief Complaint  Patient presents with  . Rectal Pain  . Abdominal Pain    HPI: [name] is a 76 y.o. female here for followup of abdominal pain and rectal pain. She was last in October 2012. She's had an extensive evaluation including CT of the abdomen and pelvis with contrast, CTA of the abdomen, colonoscopy.physical possible anal rectal fissure noted at time of colonoscopy on rectal exam on tear was not specificly identified.CT abdomen pelvis on August showed sigmoid diverticulosis, atherosclerotic type change the aorta and branch vessels.CTA of the abdomen showed no significant visceral tear occlusive disease to result in mesenteric ischemia.  She states she wakes up the morning without pain. After she's been up moving around she develops excruciating lower abdominal pain which shoots to her rectum. By 10:30 AM she is in excruciating pain and has to take 1-1/2 hydrocodone. She typically lays on the couch until the evening when she takes 1-1/2 more hydrocodone. When stands up fills like baby is going to fall out. BM once a day. No hard stools or strain. No melena or rectal bleeding. Recent heart check ok. No more hydrocortisone suppositories feels like made her have a stricture. Achy rectal pain but the worst pain is the suprapubic/lower abdominal pain. Hurts a little with BM. Pain medications help. GYN 4-5 times, pelvic exam in the office. States she was told her pain is secondary to her bowels. Eating doesn't make pain worse. BM does not relieve pain. Hurts to urinate.   Tried ntg ointment to rectum 11/2010 for ?anorectal fissure. Tried small amount 4-5 times but caused severe headaches.    Current Outpatient Prescriptions  Medication Sig Dispense Refill  . ALPRAZolam (XANAX) 0.5 MG tablet Take 0.5 mg by mouth 3 (three) times daily.        Marland Kitchen atorvastatin (LIPITOR) 10 MG tablet Take 10 mg  by mouth daily.        . fish oil-omega-3 fatty acids 1000 MG capsule Take 2 g by mouth daily.        Marland Kitchen HYDROcodone-acetaminophen (VICODIN) 5-500 MG per tablet Take 1 tablet by mouth every 4 (four) hours as needed. For pain      . levothyroxine (SYNTHROID, LEVOTHROID) 75 MCG tablet Take 75 mcg by mouth daily.        . multivitamin-lutein (OCUVITE-LUTEIN) CAPS Take 1 capsule by mouth daily.        Marland Kitchen olmesartan (BENICAR) 20 MG tablet Take 20 mg by mouth daily.      Marland Kitchen omeprazole (PRILOSEC) 40 MG capsule Take 40 mg by mouth daily.       . Probiotic Product (ALIGN) 4 MG CAPS Take 1 capsule by mouth 1 day or 1 dose.  30 capsule  5  . Verapamil HCl CR 300 MG CP24 Take 300 mg by mouth at bedtime.         Allergies as of 05/09/2011 - Review Complete 05/09/2011  Allergen Reaction Noted  . Paroxetine Itching   . Pravastatin  05/01/2011  . Venlafaxine Itching   . Hydrocodone-acetaminophen Nausea Only   . Nalbuphine Rash     ROS:  General: Negative for anorexia, weight loss, fever, chills, fatigue, weakness. ENT: Negative for hoarseness, difficulty swallowing , nasal congestion. CV: Negative for chest pain, angina, palpitations, dyspnea on exertion, peripheral edema.  Respiratory: Negative for dyspnea at rest, dyspnea on exertion, cough, sputum, wheezing.  GI: See history  of present illness. GU:  See HPI. C/O difficulty urinating at times.   Endo: Negative for unusual weight change.    Physical Examination:   BP 150/75  Pulse 62  Temp(Src) 98.2 F (36.8 C) (Temporal)  Ht 5\' 2"  (1.575 m)  Wt 154 lb 6.4 oz (70.035 kg)  BMI 28.24 kg/m2  General: Well-nourished, well-developed in no acute distress. Very anxious. Eyes: No icterus. Mouth: Oropharyngeal mucosa moist and pink , no lesions erythema or exudate. Lungs: Clear to auscultation bilaterally.  Heart: Regular rate and rhythm, no murmurs rubs or gallops.  Abdomen: Bowel sounds are normal, mild lower abdominal tenderness, no  hepatosplenomegaly or masses, no abdominal bruits or hernia , no rebound or guarding.   Rectal: Small amount of brown stool on external anal opening. No significant pain with DRE. More pain with palpation of anterior rectal wall. Soft, brown stool was heme negative. Extremities: No lower extremity edema. No clubbing or deformities. Neuro: Alert and oriented x 4   Skin: Warm and dry, no jaundice.   Psych: Alert and cooperative, normal mood and affect.

## 2011-05-10 LAB — SEDIMENTATION RATE: Sed Rate: 7 mm/hr (ref 0–22)

## 2011-05-10 LAB — URINALYSIS W MICROSCOPIC + REFLEX CULTURE
Bacteria, UA: NONE SEEN
Bilirubin Urine: NEGATIVE
Casts: NONE SEEN
Crystals: NONE SEEN
Glucose, UA: NEGATIVE mg/dL
Ketones, ur: NEGATIVE mg/dL
Nitrite: NEGATIVE
Specific Gravity, Urine: 1.016 (ref 1.005–1.030)
pH: 5.5 (ref 5.0–8.0)

## 2011-05-12 ENCOUNTER — Telehealth: Payer: Self-pay | Admitting: Internal Medicine

## 2011-05-12 NOTE — Telephone Encounter (Signed)
Please call patient as soon as her results are available.

## 2011-05-13 LAB — URINE CULTURE: Colony Count: 60000

## 2011-05-15 NOTE — Telephone Encounter (Signed)
Routed to [facility] 

## 2011-05-15 NOTE — Progress Notes (Signed)
Quick Note:  Pt aware, has not started Cipro yet. She was waiting to see if we needed to put her on something different. She said it was burning a lot.  She said she was not scheduled for CT yet.  Crystal, please schedule CT. ______

## 2011-05-15 NOTE — Telephone Encounter (Signed)
See routed result note. Thanks.

## 2011-05-15 NOTE — Progress Notes (Signed)
Quick Note:  Labs look okay. WBC normal. H/H normal. Creatinine up a little but slightly improved. Will need renal adjustment for contrast for CT. Urine culture grew enterococcus sp but awaiting sensitivities. Did she start Cipro???  Await CT. When scheduled? ______

## 2011-05-17 ENCOUNTER — Ambulatory Visit (HOSPITAL_COMMUNITY)
Admission: RE | Admit: 2011-05-17 | Discharge: 2011-05-17 | Disposition: A | Discharge status: Home / Self Care | Payer: Medicare Other | Source: Ambulatory Visit | Attending: Gastroenterology | Admitting: Gastroenterology

## 2011-05-17 ENCOUNTER — Encounter (HOSPITAL_COMMUNITY): Payer: Self-pay

## 2011-05-17 DIAGNOSIS — R300 Dysuria: Secondary | ICD-10-CM | POA: Insufficient documentation

## 2011-05-17 DIAGNOSIS — R109 Unspecified abdominal pain: Secondary | ICD-10-CM | POA: Insufficient documentation

## 2011-05-17 DIAGNOSIS — K5730 Diverticulosis of large intestine without perforation or abscess without bleeding: Secondary | ICD-10-CM | POA: Insufficient documentation

## 2011-05-17 DIAGNOSIS — R1030 Lower abdominal pain, unspecified: Secondary | ICD-10-CM

## 2011-05-17 DIAGNOSIS — K6289 Other specified diseases of anus and rectum: Secondary | ICD-10-CM

## 2011-05-17 MED ORDER — IOHEXOL 300 MG/ML  SOLN
70.0000 mL | Freq: Once | INTRAMUSCULAR | Status: AC | PRN
  Administered 2011-05-17: 70 mL via INTRAVENOUS

## 2011-05-17 NOTE — Progress Notes (Signed)
Quick Note:  Tried to call pt- LMOM ______ 

## 2011-05-17 NOTE — Progress Notes (Signed)
Quick Note:  Cipro not good for her infection, shows resistence to levaquin (same class). Recommend Augmentin 875/125mg  po bid for seven days. Please call in to her pharmacy.  Await CT results.  Recommend eating yogurt while on augmentin. ______

## 2011-05-18 NOTE — Progress Notes (Signed)
Quick Note:  Pt aware, rx called to [facility]. Pt had CT done yesterday. ______

## 2011-05-23 NOTE — Progress Notes (Signed)
Quick Note:  Chronic but slightly increased biliary dilation. Need to check LFTs. Not likely source of her symptoms.  Inflammation of pubic symphysis, bone in pelvis, which may be contributing to her symptoms. I would recommend referral to orthopedic MD for further evaluation and appropriate management.    ______

## 2011-05-24 ENCOUNTER — Other Ambulatory Visit: Payer: Self-pay | Admitting: Gastroenterology

## 2011-05-25 NOTE — Progress Notes (Signed)
Referral faxed to [place]

## 2011-06-06 ENCOUNTER — Telehealth: Payer: Self-pay | Admitting: Gastroenterology

## 2011-06-06 NOTE — Telephone Encounter (Signed)
Received fax- pt is scheduled to see Dr Aline Brochure 06/07/10 @ 11:15- She is aware of the appt

## 2011-06-07 ENCOUNTER — Other Ambulatory Visit: Payer: Self-pay | Admitting: *Deleted

## 2011-06-07 ENCOUNTER — Ambulatory Visit (INDEPENDENT_AMBULATORY_CARE_PROVIDER_SITE_OTHER): Payer: Medicare Other | Admitting: Orthopedic Surgery

## 2011-06-07 ENCOUNTER — Encounter: Payer: Self-pay | Admitting: Orthopedic Surgery

## 2011-06-07 VITALS — BP 120/70 | Ht 62.0 in | Wt 154.0 lb

## 2011-06-07 DIAGNOSIS — R3589 Other polyuria: Secondary | ICD-10-CM

## 2011-06-07 DIAGNOSIS — R102 Pelvic and perineal pain: Secondary | ICD-10-CM

## 2011-06-07 DIAGNOSIS — M899 Disorder of bone, unspecified: Secondary | ICD-10-CM

## 2011-06-07 DIAGNOSIS — M949 Disorder of cartilage, unspecified: Secondary | ICD-10-CM

## 2011-06-07 DIAGNOSIS — R358 Other polyuria: Secondary | ICD-10-CM

## 2011-06-07 MED ORDER — PREDNISONE 10 MG PO TABS: 10.0000 mg | ORAL_TABLET | Freq: Three times a day (TID) | ORAL | Status: DC

## 2011-06-07 NOTE — Progress Notes (Signed)
  Subjective:  Consult DR Sydell Axon AND PA    [name] is a 76 y.o. female who presents with Chief complaint of pelvic inflammation  The patient reports pain in her RIGHT side since September of 2012.  Symptoms came on gradually.  She complains of throbbing 8/10 constant pain worse in the late mornings and sometimes in the early morning.  Worse when she is walking and standing it's better with Vicodin there is no associated numbness or tingling  Review of systems chills and fatigue with heartburn frequency, she thinks her bladder has dropped.  She reports easy bleeding and excessive urination.  All other systems reviewed were normal  Chest no major medical problems other than some thyroid disease she does complain of some LEFT knee pain which she would like her evaluated at some time in the future  She's had a significant workup including a CT of the abdomen and pelvis which was done on May 17, 2011.  The impression was that there was no explanation for the rectal or ureteral pain.  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  This was diagnosed on CT scan.   The following portions of the patient's history were reviewed and updated as appropriate: allergies, current medications, past family history, past medical history, past social history, past surgical history and problem list.   Review of Systems Pertinent items are noted in HPI.   Objective:    BP 120/70  Ht 5\' 2"  (1.575 m)  Wt 154 lb (69.854 kg)  BMI 28.17 kg/m2  Vital signs are stable as recorded  General appearance is normal  The patient is alert and oriented x3  The patient's mood and affect are normal  Gait assessment: I do not detect any abnormalities in her ambulation The cardiovascular exam reveals normal pulses and temperature without edema swelling.  The lymphatic system is negative for palpable lymph nodes  The sensory exam is normal.  There are no pathologic reflexes.  Balance is  normal.   Exam of the pelvic area reveals there is tenderness over the symphysis pubis.  There is tenderness in the bladder area as well as the RIGHT lower quadrant.  The range of motion in her hips is normal.  The hips are stable strength is normal in both lower extremities skin is normal.   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  It is unlikely to be able to definitively diagnose osteitis pubis on CT scan.  An MRI scan or bone scan is probably a better test however, I would treat it the same way with prednisone and oral pain medication.  If it does not resolve it is unlikely that this was the diagnosis.  Recommend urology consult as well.

## 2011-06-07 NOTE — Patient Instructions (Signed)
MAKE APPOINTMENT WITH UROLOGIST FOR EVALUATION OF BLADDER

## 2011-06-13 ENCOUNTER — Telehealth: Payer: Self-pay | Admitting: Orthopedic Surgery

## 2011-06-13 NOTE — Telephone Encounter (Signed)
Patient called to inquire about the referral to neurosurgeon.  If any information, please call her back at [phone].

## 2011-06-20 NOTE — Telephone Encounter (Signed)
Patient was referred to urology, she was confused

## 2011-06-22 ENCOUNTER — Encounter: Payer: Self-pay | Admitting: Pulmonary Disease

## 2011-06-26 ENCOUNTER — Telehealth: Payer: Self-pay | Admitting: Orthopedic Surgery

## 2011-06-26 NOTE — Telephone Encounter (Signed)
Faxed referral for urology consult to [facility], Dr. Beaulah Dinning, ph# [phone], fax# [phone].  Spoke with their office and scheduled appointment, per Mardene Celeste for: Monday, 07/03/11, 12:30pm; registration time, 12:15pm.  Called patient and notified of the appointment.

## 2011-08-25 LAB — VITAMIN B12: Vitamin B-12: 1115

## 2011-08-28 ENCOUNTER — Encounter: Payer: Self-pay | Admitting: Internal Medicine

## 2011-08-29 ENCOUNTER — Ambulatory Visit (INDEPENDENT_AMBULATORY_CARE_PROVIDER_SITE_OTHER): Payer: Medicare Other | Admitting: Urgent Care

## 2011-08-29 ENCOUNTER — Encounter: Payer: Self-pay | Admitting: Urgent Care

## 2011-08-29 VITALS — BP 160/68 | HR 64 | Temp 98.2°F | Ht 65.0 in | Wt 157.4 lb

## 2011-08-29 DIAGNOSIS — M949 Disorder of cartilage, unspecified: Secondary | ICD-10-CM

## 2011-08-29 DIAGNOSIS — M899 Disorder of bone, unspecified: Secondary | ICD-10-CM

## 2011-08-29 DIAGNOSIS — K219 Gastro-esophageal reflux disease without esophagitis: Secondary | ICD-10-CM

## 2011-08-29 DIAGNOSIS — K6289 Other specified diseases of anus and rectum: Secondary | ICD-10-CM

## 2011-08-29 NOTE — Assessment & Plan Note (Addendum)
She is advised to give a trial of prednisone which was initiated by Dr. Aline Brochure for osteitis pubis. Continue Prilosec 40 mg daily Followup with Dr. Aline Brochure and consider possible physical therapy or other options if no better Talk with Dr. Luan Pulling about your options for antidepressant/anxiety medicine to help stop your pain cycle if he feels appropriate Followup with your appointment at [facility] as planned

## 2011-08-29 NOTE — Assessment & Plan Note (Signed)
Continue omeprazole 40 mg daily.  She is advised to remain on this while she is on prednisone.
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
## 2011-08-29 NOTE — Progress Notes (Signed)
Faxed to PCP

## 2011-08-29 NOTE — Assessment & Plan Note (Signed)
Persistent proctalgia, worsened with bowel movement. Suspect referred pelvic pain from osteitis pubis.

## 2011-08-29 NOTE — Patient Instructions (Addendum)
Continue Prilosec 40 mg daily Begin prednisone as directed by Dr. Aline Brochure.   This medicine should help, however if it does not you should followup with Dr. Aline Brochure and consider possible physical therapy or other options. Talk with Dr. Luan Pulling about your options for antidepressant/anxiety medicine to help stop your pain cycle if he feels appropriate Followup with your appointment at [facility] as planned

## 2011-09-04 ENCOUNTER — Emergency Department (HOSPITAL_COMMUNITY)
Admission: EM | Admit: 2011-09-04 | Discharge: 2011-09-04 | Disposition: A | Discharge status: Home / Self Care | Payer: Medicare Other | Attending: Emergency Medicine | Admitting: Emergency Medicine

## 2011-09-04 ENCOUNTER — Encounter (HOSPITAL_COMMUNITY): Payer: Self-pay

## 2011-09-04 DIAGNOSIS — J449 Chronic obstructive pulmonary disease, unspecified: Secondary | ICD-10-CM | POA: Insufficient documentation

## 2011-09-04 DIAGNOSIS — E039 Hypothyroidism, unspecified: Secondary | ICD-10-CM | POA: Insufficient documentation

## 2011-09-04 DIAGNOSIS — I10 Essential (primary) hypertension: Secondary | ICD-10-CM | POA: Insufficient documentation

## 2011-09-04 DIAGNOSIS — E785 Hyperlipidemia, unspecified: Secondary | ICD-10-CM | POA: Insufficient documentation

## 2011-09-04 DIAGNOSIS — G8929 Other chronic pain: Secondary | ICD-10-CM

## 2011-09-04 DIAGNOSIS — R109 Unspecified abdominal pain: Secondary | ICD-10-CM | POA: Insufficient documentation

## 2011-09-04 DIAGNOSIS — Z87891 Personal history of nicotine dependence: Secondary | ICD-10-CM | POA: Insufficient documentation

## 2011-09-04 DIAGNOSIS — J4489 Other specified chronic obstructive pulmonary disease: Secondary | ICD-10-CM | POA: Insufficient documentation

## 2011-09-04 DIAGNOSIS — K219 Gastro-esophageal reflux disease without esophagitis: Secondary | ICD-10-CM | POA: Insufficient documentation

## 2011-09-04 LAB — DIFFERENTIAL
Basophils Absolute: 0 10*3/uL (ref 0.0–0.1)
Basophils Relative: 1 % (ref 0–1)
Neutro Abs: 2.1 10*3/uL (ref 1.7–7.7)
Neutrophils Relative %: 41 % — ABNORMAL LOW (ref 43–77)

## 2011-09-04 LAB — CBC
Hemoglobin: 11.8 g/dL — ABNORMAL LOW (ref 12.0–15.0)
MCHC: 34.6 g/dL (ref 30.0–36.0)
RDW: 12.8 % (ref 11.5–15.5)

## 2011-09-04 MED ORDER — OXYCODONE-ACETAMINOPHEN 5-325 MG PO TABS
1.0000 | ORAL_TABLET | Freq: Once | ORAL | Status: AC
  Administered 2011-09-04: 1 via ORAL
  Filled 2011-09-04: qty 1

## 2011-09-04 MED ORDER — OXYCODONE-ACETAMINOPHEN 5-325 MG PO TABS: 1.0000 | ORAL_TABLET | Freq: Four times a day (QID) | ORAL | Status: AC | PRN

## 2011-09-04 NOTE — ED Provider Notes (Signed)
History     CSN: [id number]  Arrival date & time 09/04/11  1522   First MD Initiated Contact with Patient 09/04/11 1534      Chief Complaint  Patient presents with  . Abdominal Pain    (Consider location/radiation/quality/duration/timing/severity/associated sxs/prior treatment) The history is provided by the patient.   and patient's had abdominal pain for months now. She had polyps removed in September and has had increased pain since. She has seen multiple providers. She states she's had multiple CAT scans and evaluations. She states no his been able to find was going on. She states her primary care Dr. told her to come in here today. No fevers. The pain is worse after eating. Still having bowel movements. No nausea vomiting diarrhea. States the pain is worse after eating. The pain has been constant for months. Patient states she hopes that I will get her better. She was recently started on prednisone by the orthopedic surgeon for pelvic bony irritation. Patient states her pain is relieved by hydrocodone that she has continued to have take it. Patient states she gets so bad today that she didn't take it and slammed the door on her refrigerator and cabinet.  Past Medical History  Diagnosis Date  . Arteriosclerotic cardiovascular disease (ASCVD) 2006    Nonobstructive; < 50% lesions on cath 2002; negative stress nuclear study in 10/2004  . Hypertension   . COPD (chronic obstructive pulmonary disease)   . Colitis due to Clostridium difficile [place]  . Gastroesophageal reflux disease   . Hyperlipidemia   . Depression with anxiety   . Asthma   . Hypothyroidism   . S/P endoscopy 2009    linear esophageal erosions  . S/P colonoscopy 2007    few diverticula, otherwise nl  . S/P endoscopy 05/10/10    Question island of salmon-colored epithelium in distal esophagus ; no Barrett's.   . S/P colonoscopy 12/13/10    rectal, cecal polyp, left-sided diverticulosis, hyperplastic. ?anal  fissure? treated empirically  . Chronic pelvic pain in female     Past Surgical History  Procedure Date  . Abdominal hysterectomy   . Cholecystectomy   . Breast lumpectomy   . Cataract extraction   . Nissen fundoplication   . Colonoscopy 12/13/2010    Left-sided diverticulosis.  Cecal polyp, status post hot snare polypectomy/ Diminutive rectal polyp, status post cold biopsy removal tender/painful anal canal, query occult anal fissure nothing objectively seen, however  . Esophagogastroduodenoscopy 05/10/10    benign mucosa with mild chronic inflammation.    Family History  Problem Relation Age of Onset  . Colon cancer Neg Hx     History  Substance Use Topics  . Smoking status: Former Smoker    Quit date: 09/15/2010  . Smokeless tobacco: Not on file  . Alcohol Use: No    OB History    Grav Para Term Preterm Abortions TAB SAB Ect Mult Living                  Review of Systems  Constitutional: Positive for appetite change. Negative for fatigue.  Respiratory: Negative for choking.   Cardiovascular: Positive for chest pain. Negative for leg swelling.  Gastrointestinal: Positive for abdominal pain. Negative for blood in stool.  Genitourinary: Positive for flank pain.  Musculoskeletal: Negative for back pain.  Neurological: Negative for headaches.  Psychiatric/Behavioral: Negative for confusion.    Allergies  Paroxetine; Pravastatin; Venlafaxine; Hydrocodone-acetaminophen; and Nalbuphine  Home Medications   Current Outpatient Rx  Name Route Sig Dispense Refill  . ALPRAZOLAM 0.5 MG PO TABS Oral Take 0.5 mg by mouth 3 (three) times daily.      . ASPIRIN EC 81 MG PO TBEC Oral Take 81 mg by mouth as needed. For heart health precautions    . ATORVASTATIN CALCIUM 10 MG PO TABS Oral Take 10 mg by mouth at bedtime.     . OMEGA-3 FATTY ACIDS 1000 MG PO CAPS Oral Take 2 g by mouth at bedtime.     Marland Kitchen HYDROCHLOROTHIAZIDE 12.5 MG PO CAPS Oral Take 12.5 mg by mouth every morning.       Marland Kitchen LEVOTHYROXINE SODIUM 75 MCG PO TABS Oral Take 75 mcg by mouth every morning. *Takes on an empty stomach daily in the morning    . OCUVITE-LUTEIN PO CAPS Oral Take 1 capsule by mouth daily.      Marland Kitchen NAPROXEN SODIUM 220 MG PO CAPS Oral Take 440 mg by mouth once as needed. For pain    . OLMESARTAN MEDOXOMIL 20 MG PO TABS Oral Take 20 mg by mouth every morning.     Marland Kitchen OMEPRAZOLE 40 MG PO CPDR Oral Take 40 mg by mouth every morning.     Marland Kitchen VERAPAMIL HCL ER 300 MG PO CP24 Oral Take 300 mg by mouth at bedtime.     . OXYCODONE-ACETAMINOPHEN 5-325 MG PO TABS Oral Take 1-2 tablets by mouth every 6 (six) hours as needed for pain. 8 tablet 0    BP 163/95  Pulse 91  Temp(Src) 97.5 F (36.4 C) (Oral)  Resp 20  Ht 5\' 5"  (1.651 m)  Wt 157 lb (71.215 kg)  BMI 26.13 kg/m2  SpO2 97%  Physical Exam  Nursing note and vitals reviewed. Constitutional: She is oriented to person, place, and time. She appears well-developed and well-nourished.  HENT:  Head: Normocephalic and atraumatic.  Eyes: EOM are normal. Pupils are equal, round, and reactive to light.  Neck: Normal range of motion. Neck supple.  Cardiovascular: Normal rate, regular rhythm and normal heart sounds.   No murmur heard. Pulmonary/Chest: Effort normal and breath sounds normal. No respiratory distress. She has no wheezes. She has no rales.  Abdominal: Soft. Bowel sounds are normal. She exhibits no distension. There is tenderness. There is no rebound and no guarding.       Mild diffuse tenderness without rebound or guarding.  Musculoskeletal: Normal range of motion.  Neurological: She is alert and oriented to person, place, and time. No cranial nerve deficit.  Skin: Skin is warm and dry.  Psychiatric: She has a normal mood and affect. Her speech is normal.    ED Course  Procedures (including critical care time)  Labs Reviewed  CBC - Abnormal; Notable for the following:    Hemoglobin 11.8 (*)    HCT 34.1 (*)    All other components  within normal limits  DIFFERENTIAL - Abnormal; Notable for the following:    Neutrophils Relative 41 (*)    All other components within normal limits   No results found.   1. Chronic abdominal pain       MDM  Patient with acute on chronic abdominal pain. She seen multiple specialists for it over the last 9 months. No clear cause was found. I discussed with her primary care Dr. He recommended CBC. She states negative CAT scans and ultrasounds. She is seen everyone including orthopedic surgery. She has followup with gynecology at [facility]. She thinks that she also needs to followup with her  old gastroenterologist. She be discharged home with pain medications. She states both that she wants to get off her hydrocodone and that she wants something stronger for pain. She be discharged home with a few Percocet.        Jasper Riling. Alvino Chapel, MD 09/04/11 1749

## 2011-09-04 NOTE — ED Notes (Signed)
Pt c/o chronic abd pain since sep ?28 after Dr. Gala Romney performed a colonoscopy on her. Pt states that she hurts worse when she has a bowel movement and then hurts for several hours afterwards. Pt has been taking hydrocodone since the end of September due to pain, stopped taking the medication last week when she decided she no longer wanted to see Dr. Gala Romney. Pt also c/o diarrhea that looks like "it has strings in it". Dr. Alvino Chapel in room to see pt prior to RN.

## 2011-09-04 NOTE — ED Notes (Signed)
Pt initially refused percocet, requesting " I need morphine in my arm because percocet will mess up my stomach," Dr. Alvino Chapel notified, no orders given, pt notified, advised that she will take percocet.

## 2011-09-04 NOTE — Discharge Instructions (Signed)
Pain of Unknown Etiology (Pain Without a Known Cause) You have come to your caregiver because of pain. Pain can occur in any part of the body. Often there is not a definite cause. If your laboratory (blood or urine) work was normal and x-rays or other studies were normal, your caregiver may treat you without knowing the cause of the pain. An example of this is the headache. Most headaches are diagnosed by taking a history. This means your caregiver asks you questions about your headaches. Your caregiver determines a treatment based on your answers. Usually testing done for headaches is normal. Often testing is not done unless there is no response to medications. Regardless of where your pain is located today, you can be given medications to make you comfortable. If no physical cause of pain can be found, most cases of pain will gradually leave as suddenly as they came.  If you have a painful condition and no reason can be found for the pain, It is importantthat you follow up with your caregiver. If the pain becomes worse or does not go away, it may be necessary to repeat tests and look further for a possible cause.  Only take over-the-counter or prescription medicines for pain, discomfort, or fever as directed by your caregiver.   For the protection of your privacy, test results can not be given over the phone. Make sure you receive the results of your test. Ask as to how these results are to be obtained if you have not been informed. It is your responsibility to obtain your test results.   You may continue all activities unless the activities cause more pain. When the pain lessens, it is important to gradually resume normal activities. Resume activities by beginning slowly and gradually increasing the intensity and duration of the activities or exercise. During periods of severe pain, bed-rest may be helpful. Lay or sit in any position that is comfortable.   Ice used for acute (sudden) conditions may be  effective. Use a large plastic bag filled with ice and wrapped in a towel. This may provide pain relief.   See your caregiver for continued problems. They can help or refer you for exercises or physical therapy if necessary.  If you were given medications for your condition, do not drive, operate machinery or power tools, or sign legal documents for 24 hours. Do not drink alcohol, take sleeping pills, or take other medications that may interfere with treatment. See your caregiver immediately if you have pain that is becoming worse and not relieved by medications. Document Released: 12/06/2000 Document Revised: 03/02/2011 Document Reviewed: 03/13/2005 [facility] Patient Information 2012 [place].

## 2011-09-04 NOTE — ED Notes (Signed)
Pt says had a colonoscopy in Sept and had 2 polyps removed.  Reports has had pain in lower abd since.  Says has had some nausea, no vomiting or diarrhea.  Says has been having regular bowel movements.  LBM was today, says stool looks like has "strings" in it.

## 2011-10-26 NOTE — Progress Notes (Signed)
REVIEWED.  

## 2011-11-28 ENCOUNTER — Ambulatory Visit (INDEPENDENT_AMBULATORY_CARE_PROVIDER_SITE_OTHER): Payer: Medicare Other | Admitting: Internal Medicine

## 2011-12-05 ENCOUNTER — Ambulatory Visit (INDEPENDENT_AMBULATORY_CARE_PROVIDER_SITE_OTHER): Payer: Medicare Other | Admitting: Internal Medicine

## 2011-12-11 ENCOUNTER — Ambulatory Visit (INDEPENDENT_AMBULATORY_CARE_PROVIDER_SITE_OTHER): Payer: Medicare Other | Admitting: Internal Medicine

## 2011-12-11 ENCOUNTER — Encounter (INDEPENDENT_AMBULATORY_CARE_PROVIDER_SITE_OTHER): Payer: Self-pay | Admitting: Internal Medicine

## 2011-12-11 ENCOUNTER — Encounter (INDEPENDENT_AMBULATORY_CARE_PROVIDER_SITE_OTHER): Payer: Self-pay | Admitting: *Deleted

## 2011-12-11 ENCOUNTER — Other Ambulatory Visit (INDEPENDENT_AMBULATORY_CARE_PROVIDER_SITE_OTHER): Payer: Self-pay | Admitting: *Deleted

## 2011-12-11 VITALS — BP 108/72 | HR 66 | Temp 96.9°F | Resp 20 | Ht 60.0 in | Wt 153.0 lb

## 2011-12-11 DIAGNOSIS — K6289 Other specified diseases of anus and rectum: Secondary | ICD-10-CM

## 2011-12-11 DIAGNOSIS — G8929 Other chronic pain: Secondary | ICD-10-CM

## 2011-12-11 NOTE — Patient Instructions (Signed)
Flexible sigmoidoscopy to be scheduled 

## 2011-12-11 NOTE — Progress Notes (Signed)
Referring Provider: Alonza Bogus, MD Primary Care Physician:  Alonza Bogus, MD Primary Gastroenterologist:  Dr. Laural Golden  Chief Complaint  Patient presents with  . New Evaluation    Abdominal Pain , Rectal Pain    HPI:  Patient is 76 year old Caucasian female who was referred through courtesy of Dr. Luan Pulling for GI evaluation. She presents with rectal pain which she describes as excruciating and effecting quality of her life. While she has had mild pain before but pain has been worse for the last one year. She is convinced that she either has ulcer or tumor in her rectum. She describes her pain to be constant and the only relief she gets is when she takes her pain medication. She denies rectal discharge melena or rectal bleeding. Her bowels move daily. She does have urgency. She does not get any relief with bowel movements. She states she's been seen by at least 3 different gynecologist in evaluation has been negative. She also has undergone multiple scans no abnormality has been found to account for symptoms. She has good appetite and her weight is stable. She states her heartburn is well controlled with medication. She had antireflux surgery years ago and did provide her relief only for few years. She denies dysphagia. She remains under a lot of stress and very unhappy home situation. She does feel depressed. However she has not been tolerant of antidepressants. She has macular degeneration and only allowed to drive short distance during daytime. She denies fever, chills, dysuria, hematuria or vaginal discharge. She was evaluated by Dr. 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  Past Medical History  Diagnosis Date  . Arteriosclerotic cardiovascular disease (ASCVD) 2006    Nonobstructive; < 50% lesions on cath 2002; negative stress nuclear study in 10/2004  . Hypertension   . COPD (chronic  obstructive pulmonary disease)   . Colitis due to Clostridium difficile [place]  . Gastroesophageal reflux disease   . Hyperlipidemia   . Depression with anxiety   . Asthma   . Hypothyroidism   . S/P endoscopy 2009    linear esophageal erosions  . S/P colonoscopy 2007    few diverticula, otherwise nl  . S/P endoscopy 05/10/10    Question island of salmon-colored epithelium in distal esophagus ; no Barrett's.   . S/P colonoscopy 12/13/10    rectal, cecal polyp, left-sided diverticulosis, hyperplastic. ?anal fissure? treated empirically  . Chronic pelvic pain in female     Past Surgical History  Procedure Date  . Abdominal hysterectomy   . Cholecystectomy   . Breast lumpectomy   . Cataract extraction   . Nissen fundoplication   . Colonoscopy 12/13/2010    Left-sided diverticulosis.  Cecal polyp, status post hot snare polypectomy/ Diminutive rectal polyp, status post cold biopsy removal tender/painful anal canal, query occult anal fissure nothing objectively seen, however  . Esophagogastroduodenoscopy 05/10/10    benign mucosa with mild chronic inflammation.    Current Outpatient Prescriptions  Medication Sig Dispense Refill  . ALPRAZolam (XANAX) 0.5 MG tablet Take 0.5 mg by mouth 3 (three) times daily.        Marland Kitchen atorvastatin (LIPITOR) 10 MG tablet Take 10 mg by mouth at bedtime.       Marland Kitchen ESTRACE VAGINAL 0.1 MG/GM vaginal cream Place 2 g vaginally. She uses twice a week      . fish oil-omega-3 fatty acids 1000 MG capsule Take  2 g by mouth at bedtime.       . hydrochlorothiazide (MICROZIDE) 12.5 MG capsule Take 12.5 mg by mouth every morning.       Marland Kitchen HYDROcodone-acetaminophen (VICODIN) 5-500 MG per tablet Patient states that she takes 1 1/2 in the morning , 1 during the day , and 1 1/2 at bedtime      . levothyroxine (SYNTHROID, LEVOTHROID) 75 MCG tablet Take 75 mcg by mouth every morning. *Takes on an empty stomach daily in the morning      . multivitamin-lutein (OCUVITE-LUTEIN)  CAPS Take 1 capsule by mouth daily.        Marland Kitchen olmesartan (BENICAR) 20 MG tablet Take 20 mg by mouth every morning.       Marland Kitchen omeprazole (PRILOSEC) 40 MG capsule Take 40 mg by mouth every morning.       Marland Kitchen PROCTOZONE-HC 2.5 % rectal cream Place 1 application rectally 2 (two) times daily.       . Verapamil HCl CR 300 MG CP24 Take 300 mg by mouth at bedtime.         Allergies as of 12/11/2011 - Review Complete 12/11/2011  Allergen Reaction Noted  . Paroxetine Itching   . Pravastatin  05/01/2011  . Venlafaxine Itching   . Hydrocodone-acetaminophen Nausea Only   . Nalbuphine Rash     Family History  Problem Relation Age of Onset  . Colon cancer Neg Hx   . Diabetes Mother   . Heart disease Father   . Hypertension Sister   . Heart disease Brother   . Hypertension Brother   . Hypertension Sister   . Heart disease Brother   . Heart disease Son   . Heart disease Son   . Anuerysm Son   . Diabetes Son     History   Social History  . Marital Status: Married    Spouse Name: N/A    Number of Children: N/A  . Years of Education: N/A   Occupational History  . Not on file.   Social History Main Topics  . Smoking status: Former Smoker    Quit date: 09/15/2010  . Smokeless tobacco: Never Used  . Alcohol Use: No  . Drug Use: No  . Sexually Active: Not Currently   Other Topics Concern  . Not on file   Social History Narrative  . No narrative on file    Review of Systems: See HPI, otherwise normal ROS  Physical Exam: BP 108/72  Pulse 66  Temp 96.9 F (36.1 C) (Oral)  Resp 20  Ht 5' (1.524 m)  Wt 153 lb (69.4 kg)  BMI 29.88 kg/m2 General:   Alert,  Well-developed, well-nourished, pleasant and cooperative in NAD. She has mood swings; one minute she is laughing and the next minute she is in tears. Head:  Normocephalic and atraumatic. Eyes:  Sclera clear, no icterus.   Conjunctiva pink. Mouth:  Oropharyngeal mucosa is normal. She has upper dental plate.  Neck:  Supple; no  masses or thyromegaly. Lungs:  Clear throughout to auscultation.   No wheezes, crackles, or rhonchi. No acute distress. Heart:  Regular rate and rhythm; no murmurs. Abdomen:  Is symmetrical. Bowel sounds are normal. No bruits noted. Abdomen is soft without organomegaly or masses. Mild tenderness noted in suprapubic region. Rectal examination deferred.    Extremities:  Without clubbing or edema. Neurologic:  Alert and  oriented x4;  grossly normal neurologically.   Lab data; CTA abdomen 01/16/2011. Separate origins of hepatic and splenic  arteries. Wide-open SMA and mild disease to IMA. CT from 05/09/2011 reviewed. Postop changes at GE junction .Sigmoid diverticulosis. Evidence of mild osteitis pubis; dilated biliary system slightly increased since 2009. Colonoscopy by Dr. Gala Romney on 12/13/2010 revealing left-sided diverticulosis and 2 polyps. Both cecal and rectal polyps were hyperplastic.   Assessment; Patient has chronic rectal pain with negative workup. Some of her pain may be secondary to osteitis pubis with the pain that bothers her the most is deep pain and does not appear to be pubic pain. Underlying stress disorder and depression is making her symptoms worse. Will examine rectosigmoid area to make sure she has not developed  rectal ulcer at index of suspicion is low but hopefully negative exam would help in her management. I believe she would benefit from antidepressant but will leave it up to Dr. Luan Pulling. Recommendations; Diagnostic flexible sigmoidoscopy with IV sedation. Further recommendations to follow.

## 2011-12-13 ENCOUNTER — Encounter (HOSPITAL_COMMUNITY): Payer: Self-pay

## 2011-12-22 ENCOUNTER — Ambulatory Visit (HOSPITAL_COMMUNITY)
Admission: RE | Admit: 2011-12-22 | Discharge: 2011-12-22 | Disposition: A | Discharge status: Home / Self Care | Payer: Medicare Other | Source: Ambulatory Visit | Attending: Pulmonary Disease | Admitting: Pulmonary Disease

## 2011-12-22 ENCOUNTER — Other Ambulatory Visit (HOSPITAL_COMMUNITY): Payer: Self-pay | Admitting: Pulmonary Disease

## 2011-12-22 DIAGNOSIS — R079 Chest pain, unspecified: Secondary | ICD-10-CM | POA: Insufficient documentation

## 2011-12-29 ENCOUNTER — Encounter (HOSPITAL_COMMUNITY): Payer: Self-pay | Admitting: *Deleted

## 2011-12-29 ENCOUNTER — Ambulatory Visit (HOSPITAL_COMMUNITY)
Admission: RE | Admit: 2011-12-29 | Discharge: 2011-12-29 | Disposition: A | Discharge status: Home / Self Care | Payer: Medicare Other | Source: Ambulatory Visit | Attending: Internal Medicine | Admitting: Internal Medicine

## 2011-12-29 ENCOUNTER — Encounter (HOSPITAL_COMMUNITY)
Admission: RE | Disposition: A | Discharge status: Home / Self Care | Payer: Self-pay | Source: Ambulatory Visit | Attending: Internal Medicine

## 2011-12-29 DIAGNOSIS — J449 Chronic obstructive pulmonary disease, unspecified: Secondary | ICD-10-CM | POA: Insufficient documentation

## 2011-12-29 DIAGNOSIS — K6289 Other specified diseases of anus and rectum: Secondary | ICD-10-CM | POA: Insufficient documentation

## 2011-12-29 DIAGNOSIS — K644 Residual hemorrhoidal skin tags: Secondary | ICD-10-CM

## 2011-12-29 DIAGNOSIS — E785 Hyperlipidemia, unspecified: Secondary | ICD-10-CM | POA: Insufficient documentation

## 2011-12-29 DIAGNOSIS — J4489 Other specified chronic obstructive pulmonary disease: Secondary | ICD-10-CM | POA: Insufficient documentation

## 2011-12-29 DIAGNOSIS — K5730 Diverticulosis of large intestine without perforation or abscess without bleeding: Secondary | ICD-10-CM | POA: Insufficient documentation

## 2011-12-29 DIAGNOSIS — I10 Essential (primary) hypertension: Secondary | ICD-10-CM | POA: Insufficient documentation

## 2011-12-29 HISTORY — PX: FLEXIBLE SIGMOIDOSCOPY: SHX5431

## 2011-12-29 SURGERY — SIGMOIDOSCOPY, FLEXIBLE
Anesthesia: Moderate Sedation

## 2011-12-29 MED ORDER — MIDAZOLAM HCL 5 MG/5ML IJ SOLN
INTRAMUSCULAR | Status: AC
  Filled 2011-12-29: qty 5

## 2011-12-29 MED ORDER — MEPERIDINE HCL 50 MG/ML IJ SOLN
INTRAMUSCULAR | Status: AC
  Filled 2011-12-29: qty 1

## 2011-12-29 MED ORDER — MEPERIDINE HCL 25 MG/ML IJ SOLN
INTRAMUSCULAR | Status: DC | PRN
  Administered 2011-12-29 (×2): 25 mg via INTRAVENOUS

## 2011-12-29 MED ORDER — STERILE WATER FOR IRRIGATION IR SOLN
Status: DC | PRN
  Administered 2011-12-29: 13:00:00

## 2011-12-29 MED ORDER — MIDAZOLAM HCL 5 MG/5ML IJ SOLN
INTRAMUSCULAR | Status: DC | PRN
  Administered 2011-12-29: 3 mg via INTRAVENOUS
  Administered 2011-12-29 (×2): 2 mg via INTRAVENOUS
  Administered 2011-12-29: 3 mg via INTRAVENOUS

## 2011-12-29 MED ORDER — SODIUM CHLORIDE 0.45 % IV SOLN
INTRAVENOUS | Status: DC
  Administered 2011-12-29: 13:00:00 via INTRAVENOUS

## 2011-12-29 NOTE — H&P (Signed)
[name] is an 76 y.o. female.   Chief Complaint: Patient is here for flexible sigmoidoscopy. HPI: Patient is 76 year old Caucasian female with recurrent rectal pain described intractable at times. She also has chronic constipation and try to control this problem podiatry measures. Her last colonoscopy was in September 2012. She is convinced that lesion was missed. She is therefore undergoing a limited exam today.  Past Medical History  Diagnosis Date  . Arteriosclerotic cardiovascular disease (ASCVD) 2006    Nonobstructive; < 50% lesions on cath 2002; negative stress nuclear study in 10/2004  . Hypertension   . COPD (chronic obstructive pulmonary disease)   . Colitis due to Clostridium difficile [place]  . Gastroesophageal reflux disease   . Hyperlipidemia   . Depression with anxiety   . Asthma   . Hypothyroidism   . S/P endoscopy 2009    linear esophageal erosions  . S/P colonoscopy 2007    few diverticula, otherwise nl  . S/P endoscopy 05/10/10    Question island of salmon-colored epithelium in distal esophagus ; no Barrett's.   . S/P colonoscopy 12/13/10    rectal, cecal polyp, left-sided diverticulosis, hyperplastic. ?anal fissure? treated empirically  . Chronic pelvic pain in female     Past Surgical History  Procedure Date  . Abdominal hysterectomy   . Cholecystectomy   . Breast lumpectomy   . Cataract extraction   . Nissen fundoplication   . Colonoscopy 12/13/2010    Left-sided diverticulosis.  Cecal polyp, status post hot snare polypectomy/ Diminutive rectal polyp, status post cold biopsy removal tender/painful anal canal, query occult anal fissure nothing objectively seen, however  . Esophagogastroduodenoscopy 05/10/10    benign mucosa with mild chronic inflammation.    Family History  Problem Relation Age of Onset  . Colon cancer Neg Hx   . Diabetes Mother   . Heart disease Father   . Hypertension Sister   . Heart disease Brother   . Hypertension  Brother   . Hypertension Sister   . Heart disease Brother   . Heart disease Son   . Heart disease Son   . Anuerysm Son   . Diabetes Son    Social History:  reports that she quit smoking about 15 months ago. She has never used smokeless tobacco. She reports that she does not drink alcohol or use illicit drugs.  Allergies:  Allergies  Allergen Reactions  . Paroxetine Itching  . Sulfa Antibiotics Other (See Comments)    Childhood reaction.  . Venlafaxine Itching  . Nalbuphine Rash    Medications Prior to Admission  Medication Sig Dispense Refill  . ALPRAZolam (XANAX) 0.5 MG tablet Take 0.5 mg by mouth 4 (four) times daily as needed. For anxiety.      Marland Kitchen atorvastatin (LIPITOR) 10 MG tablet Take 10 mg by mouth at bedtime.       . hydrochlorothiazide (HYDRODIURIL) 25 MG tablet Take 12.5 mg by mouth daily.      Marland Kitchen HYDROcodone-acetaminophen (VICODIN) 5-500 MG per tablet Take 1-1.5 tablets by mouth 3 (three) times daily. Patient states that she takes 1 1/2 in the morning , 1 during the day , and 1 1/2 at bedtime      . hydroxypropyl methylcellulose (ISOPTO TEARS) 2.5 % ophthalmic solution Place 1 drop into both eyes 3 (three) times daily as needed. For dry eyes.      Marland Kitchen levothyroxine (SYNTHROID, LEVOTHROID) 75 MCG tablet Take 75 mcg by mouth daily. *Takes on an empty stomach  daily in the morning* *brand name only per patient*      . multivitamin-lutein (OCUVITE-LUTEIN) CAPS Take 2 capsules by mouth daily.       Marland Kitchen olmesartan (BENICAR) 20 MG tablet Take 20 mg by mouth every morning.       Marland Kitchen omeprazole (PRILOSEC) 40 MG capsule Take 40 mg by mouth 2 (two) times daily.       Marland Kitchen PROCTOZONE-HC 2.5 % rectal cream Place 1 application rectally 2 (two) times daily.       . Verapamil HCl CR 300 MG CP24 Take 300 mg by mouth at bedtime.       Marland Kitchen ESTRACE VAGINAL 0.1 MG/GM vaginal cream Place 2 g vaginally. She uses twice a week      . fish oil-omega-3 fatty acids 1000 MG capsule Take 2 g by mouth at bedtime.          No results found for this or any previous visit (from the past 48 hour(s)). No results found.  ROS  Blood pressure 149/61, pulse 60, temperature 97.4 F (36.3 C), temperature source Oral, resp. rate 15, height 5' (1.524 m), weight 153 lb (69.4 kg), SpO2 100.00%. Physical Exam  Constitutional: She appears well-developed and well-nourished.  HENT:  Mouth/Throat: Oropharynx is clear and moist.  Eyes: Conjunctivae normal are normal. No scleral icterus.  Neck: No thyromegaly present.  Cardiovascular: Normal rate, regular rhythm and normal heart sounds.   No murmur heard. Respiratory: Effort normal and breath sounds normal.  GI: Soft. She exhibits no mass. There is tenderness (mild hypogastric tenderness).  Musculoskeletal: She exhibits no edema.  Lymphadenopathy:    She has no cervical adenopathy.  Neurological: She is alert.  Skin: Skin is warm and dry.     Assessment/Plan Rectal pain. Flexible sigmoidoscopy.  REHMAN,NAJEEB U 12/29/2011, 1:06 PM

## 2011-12-29 NOTE — Op Note (Signed)
FLEXIBLE SIGMOIDOSCOPY  PROCEDURE REPORT  PATIENT:  [name]  MR#:  [id number] Birthdate:  01/18/1936, 76 y.o., female Endoscopist:  Dr. Rogene Houston, MD Referred By:  Dr. Rayne Du ref. provider found Procedure Date: 12/29/2011  Procedure:  Flexible sigmoidoscopy  Indications: Patient is 76 year old Caucasian female with intractable rectal not responding to various therapies. She is undergoing a diagnostic flexible sigmoidoscopy. She was last colonoscopy was in September 2012.  Informed Consent:  The procedure and risks were reviewed with the patient and informed consent was obtained.  Medications:  Demerol 50 mg IV Versed 10 mg IV  Description of procedure:  After a digital rectal exam was performed, that colonoscope was advanced from the anus through the rectum and advanced into sigmoid colon. Scope was passed 45 cm. As the scope was withdrawn colonic mucosa was carefully examined and findings noted. Go was retroflexed in the rectum. Findings:   Scattered diverticula at sigmoid colon. Normal rectal mucosa. Small hemorrhoids below the dentate line.  Therapeutic/Diagnostic Maneuvers Performed:  None  Complications:  None  mpression:  Scattered diverticula at sigmoid colon. Small external hemorrhoids. No evidence of proctitis, polyps or tumor  in the segments that were examined.   Recommendations:  Patient will resume her usual medications as before. MR of lumbosacral spine to be scheduled.   REHMAN,NAJEEB U  12/29/2011 1:25 PM  CC: Dr. Alonza Bogus, MD & Dr. Rayne Du ref. provider found

## 2012-01-01 ENCOUNTER — Other Ambulatory Visit (INDEPENDENT_AMBULATORY_CARE_PROVIDER_SITE_OTHER): Payer: Self-pay | Admitting: Internal Medicine

## 2012-01-01 ENCOUNTER — Telehealth (INDEPENDENT_AMBULATORY_CARE_PROVIDER_SITE_OTHER): Payer: Self-pay | Admitting: *Deleted

## 2012-01-01 DIAGNOSIS — Z0189 Encounter for other specified special examinations: Secondary | ICD-10-CM

## 2012-01-01 DIAGNOSIS — K6289 Other specified diseases of anus and rectum: Secondary | ICD-10-CM

## 2012-01-01 NOTE — Telephone Encounter (Signed)
Patient is sch'd for MR lumbar spine 01/03/12 and will need order for creatinine

## 2012-01-01 NOTE — Telephone Encounter (Signed)
Creatinine Lab Order sent to [facility]

## 2012-01-01 NOTE — Telephone Encounter (Signed)
Patient is to have MRI done.

## 2012-01-02 ENCOUNTER — Other Ambulatory Visit (INDEPENDENT_AMBULATORY_CARE_PROVIDER_SITE_OTHER): Payer: Self-pay | Admitting: Internal Medicine

## 2012-01-02 ENCOUNTER — Encounter: Payer: Self-pay | Admitting: Pulmonary Disease

## 2012-01-02 LAB — CREATININE, SERUM: Creat: 1.46 mg/dL — ABNORMAL HIGH (ref 0.50–1.10)

## 2012-01-03 ENCOUNTER — Ambulatory Visit (HOSPITAL_COMMUNITY)
Admission: RE | Admit: 2012-01-03 | Discharge: 2012-01-03 | Disposition: A | Discharge status: Home / Self Care | Payer: Medicare Other | Source: Ambulatory Visit | Attending: Internal Medicine | Admitting: Internal Medicine

## 2012-01-03 ENCOUNTER — Encounter (HOSPITAL_COMMUNITY): Payer: Self-pay | Admitting: Internal Medicine

## 2012-01-03 DIAGNOSIS — M5126 Other intervertebral disc displacement, lumbar region: Secondary | ICD-10-CM | POA: Insufficient documentation

## 2012-01-03 DIAGNOSIS — K6289 Other specified diseases of anus and rectum: Secondary | ICD-10-CM

## 2012-01-03 DIAGNOSIS — M7130 Other bursal cyst, unspecified site: Secondary | ICD-10-CM | POA: Insufficient documentation

## 2012-01-03 MED ORDER — GADOBENATE DIMEGLUMINE 529 MG/ML IV SOLN
7.0000 mL | Freq: Once | INTRAVENOUS | Status: AC | PRN
  Administered 2012-01-03: 7 mL via INTRAVENOUS

## 2012-01-11 ENCOUNTER — Encounter (INDEPENDENT_AMBULATORY_CARE_PROVIDER_SITE_OTHER): Payer: Self-pay

## 2012-01-30 ENCOUNTER — Encounter: Payer: Self-pay | Admitting: Cardiology

## 2012-01-30 ENCOUNTER — Ambulatory Visit (INDEPENDENT_AMBULATORY_CARE_PROVIDER_SITE_OTHER): Payer: Medicare Other | Admitting: Cardiology

## 2012-01-30 VITALS — BP 154/82 | HR 64 | Ht 60.0 in | Wt 156.0 lb

## 2012-01-30 DIAGNOSIS — I7090 Unspecified atherosclerosis: Secondary | ICD-10-CM

## 2012-01-30 DIAGNOSIS — F418 Other specified anxiety disorders: Secondary | ICD-10-CM

## 2012-01-30 DIAGNOSIS — I2510 Atherosclerotic heart disease of native coronary artery without angina pectoris: Secondary | ICD-10-CM

## 2012-01-30 DIAGNOSIS — I10 Essential (primary) hypertension: Secondary | ICD-10-CM

## 2012-01-30 DIAGNOSIS — F17201 Nicotine dependence, unspecified, in remission: Secondary | ICD-10-CM | POA: Insufficient documentation

## 2012-01-30 DIAGNOSIS — F341 Dysthymic disorder: Secondary | ICD-10-CM

## 2012-01-30 DIAGNOSIS — E785 Hyperlipidemia, unspecified: Secondary | ICD-10-CM

## 2012-01-30 DIAGNOSIS — R1084 Generalized abdominal pain: Secondary | ICD-10-CM

## 2012-01-30 DIAGNOSIS — E039 Hypothyroidism, unspecified: Secondary | ICD-10-CM

## 2012-01-30 DIAGNOSIS — Z87891 Personal history of nicotine dependence: Secondary | ICD-10-CM

## 2012-01-30 DIAGNOSIS — N183 Chronic kidney disease, stage 3 unspecified: Secondary | ICD-10-CM

## 2012-01-30 NOTE — Progress Notes (Signed)
Patient ID: [name], female   DOB: Mar 26, 1936, 76 y.o.   MRN: [id number]  HPI: Scheduled return visit for this lovely woman who has been a patient of mine for many years and whose mother was a patient of mine for many years prior to that.  [name] had mild coronary disease when coronary angiography was performed in 2002.  She has had intermittent chest discomfort, which continues, and which does not clearly represent myocardial ischemia.  She has dyspnea on moderate exertion, but rarely experiences these symptoms with her sedentary lifestyle.  Blood pressure is well controlled at home, but is frequently elevated in physician offices.  She has undergone extensive GI evaluation for chronic pelvic and abdominal pain without elucidation of an etiology.  She has had hyperplastic polyps resected, most recently in late 2012, has asymptomatic diverticulosis and had a possible fissure in ano when colonoscopy was performed in 12/2010.  She discontinued cigarette smoking nearly one year ago.  This has resulted in a significant weight gain.  Extensive prior medical records were provided by the patient and reviewed.  Appropriate sections of the EMR were updated.  Prior to Admission medications   Medication Sig Start Date End Date Taking? Authorizing Provider  ALPRAZolam Duanne Moron) 0.5 MG tablet Take 0.5 mg by mouth 4 (four) times daily as needed. For anxiety.   Yes Historical Provider, MD  atorvastatin (LIPITOR) 10 MG tablet Take 10 mg by mouth at bedtime.    Yes Historical Provider, MD  ESTRACE VAGINAL 0.1 MG/GM vaginal cream Place 2 g vaginally. She uses twice a week 10/26/11  Yes Historical Provider, MD  fish oil-omega-3 fatty acids 1000 MG capsule Take 2 g by mouth at bedtime.    Yes Historical Provider, MD  hydrochlorothiazide (HYDRODIURIL) 25 MG tablet Take 12.5 mg by mouth daily.   Yes Historical Provider, MD  HYDROcodone-acetaminophen (VICODIN) 5-500 MG per tablet Take 1-1.5 tablets by mouth 3 (three)  times daily. Patient states that she takes 1 1/2 in the morning , 1 during the day , and 1 1/2 at bedtime 12/02/11  Yes Historical Provider, MD  hydroxypropyl methylcellulose (ISOPTO TEARS) 2.5 % ophthalmic solution Place 1 drop into both eyes 3 (three) times daily as needed. For dry eyes.   Yes Historical Provider, MD  levothyroxine (SYNTHROID, LEVOTHROID) 75 MCG tablet Take 75 mcg by mouth daily. *Takes on an empty stomach daily in the morning* *brand name only per patient*   Yes Historical Provider, MD  multivitamin-lutein (OCUVITE-LUTEIN) CAPS Take 1 capsule by mouth daily.    Yes Historical Provider, MD  olmesartan (BENICAR) 20 MG tablet Take 20 mg by mouth every morning.    Yes Historical Provider, MD  omeprazole (PRILOSEC) 40 MG capsule Take 40 mg by mouth 2 (two) times daily.  11/28/10  Yes Historical Provider, MD  PROCTOZONE-HC 2.5 % rectal cream Place 1 application rectally 2 (two) times daily.  10/09/11  Yes Historical Provider, MD  sertraline (ZOLOFT) 25 MG tablet Take 25 mg by mouth daily.  12/13/11  Yes Historical Provider, MD  Verapamil HCl CR 300 MG CP24 Take 300 mg by mouth at bedtime.  11/25/10  Yes Historical Provider, MD   Allergies  Allergen Reactions  . Paroxetine Itching  . Sulfa Antibiotics Other (See Comments)    Childhood reaction.  . Venlafaxine Itching  . Vicodin (Hydrocodone-Acetaminophen)   . Nalbuphine Rash     Past medical history, social history, and family history reviewed and updated.  ROS: Denies orthopnea, PND,  pedal edema, palpitations, lightheadedness or syncope.  She describes chronic abdominal pain and intermittent diarrhea.  She has substantial anxiety and continued difficulty in her relationship with her husband.  She became tearful when discussing her mother.  All other systems reviewed and are negative.  PHYSICAL EXAM: BP 154/82  Pulse 64  Ht 5' (1.524 m)  Wt 70.761 kg (156 lb)  BMI 30.47 kg/m2  SpO2 97%  General-Well developed; no acute  distress Body habitus-Mildly overweight Neck-No JVD; no carotid bruits Lungs-clear lung fields; resonant to percussion Cardiovascular-normal PMI; normal S1 and S2 Abdomen-normal bowel sounds; soft and non-tender without masses or organomegaly Musculoskeletal-No deformities, no cyanosis or clubbing Neurologic-Normal cranial nerves; symmetric strength and tone Skin-Warm, no significant lesions Extremities-distal pulses intact; no edema  ASSESSMENT AND PLAN:  Jessica Ducking, MD 01/30/2012 11:58 AM

## 2012-01-30 NOTE — Patient Instructions (Addendum)
Your physician recommends that you schedule a follow-up appointment in: 9 months

## 2012-01-30 NOTE — Assessment & Plan Note (Signed)
Continues to have significant emotional issues, but has dealt with these fairly well on her own.

## 2012-01-30 NOTE — Assessment & Plan Note (Signed)
Patient remains asymptomatic from a cardiovascular standpoint with generally good control of cardiovascular risk factors.

## 2012-01-30 NOTE — Assessment & Plan Note (Addendum)
Systolic blood pressure greater than 140 approximately 50% of the time in the office setting; Diastolics almost never greater than 90.  Patient brings in a list of 10 BP recordings obtained at home with systolics consistently less than 130.  She appears to have white coat hypertension with normal blood pressure in the nonmedical setting.  Continued monitoring is advised.

## 2012-01-30 NOTE — Assessment & Plan Note (Signed)
Renal disease remains fairly mild, but has been progressive over the past year.

## 2012-01-30 NOTE — Assessment & Plan Note (Signed)
Congratulated on discontinuing tobacco use.

## 2012-01-30 NOTE — Assessment & Plan Note (Signed)
Adequate control of hyperlipidemia with current therapy.

## 2012-01-30 NOTE — Progress Notes (Deleted)
Name: [name]    DOB: February 10, 1936  Age: 76 y.o.  MR#: [id number]       PCP:  Alonza Bogus, MD      Insurance: @PAYORNAME @   CC:   No chief complaint on file.  MED LIST  VS BP 154/82  Pulse 64  Ht 5' (1.524 m)  Wt 156 lb (70.761 kg)  BMI 30.47 kg/m2  SpO2 97%  Weights Current Weight  01/30/12 156 lb (70.761 kg)  12/29/11 153 lb (69.4 kg)  12/29/11 153 lb (69.4 kg)    Blood Pressure  BP Readings from Last 3 Encounters:  01/30/12 154/82  12/29/11 128/89  12/29/11 128/89     Admit date:  (Not on file) Last encounter with RMR:  Visit date not found   Allergy Allergies  Allergen Reactions  . Paroxetine Itching  . Sulfa Antibiotics Other (See Comments)    Childhood reaction.  . Venlafaxine Itching  . Vicodin (Hydrocodone-Acetaminophen)   . Nalbuphine Rash    Current Outpatient Prescriptions  Medication Sig Dispense Refill  . ALPRAZolam (XANAX) 0.5 MG tablet Take 0.5 mg by mouth 4 (four) times daily as needed. For anxiety.      Marland Kitchen atorvastatin (LIPITOR) 10 MG tablet Take 10 mg by mouth at bedtime.       Marland Kitchen ESTRACE VAGINAL 0.1 MG/GM vaginal cream Place 2 g vaginally. She uses twice a week      . fish oil-omega-3 fatty acids 1000 MG capsule Take 2 g by mouth at bedtime.       . hydrochlorothiazide (HYDRODIURIL) 25 MG tablet Take 12.5 mg by mouth daily.      Marland Kitchen HYDROcodone-acetaminophen (VICODIN) 5-500 MG per tablet Take 1-1.5 tablets by mouth 3 (three) times daily. Patient states that she takes 1 1/2 in the morning , 1 during the day , and 1 1/2 at bedtime      . hydroxypropyl methylcellulose (ISOPTO TEARS) 2.5 % ophthalmic solution Place 1 drop into both eyes 3 (three) times daily as needed. For dry eyes.      Marland Kitchen levothyroxine (SYNTHROID, LEVOTHROID) 75 MCG tablet Take 75 mcg by mouth daily. *Takes on an empty stomach daily in the morning* *brand name only per patient*      . multivitamin-lutein (OCUVITE-LUTEIN) CAPS Take 1 capsule by mouth daily.       Marland Kitchen olmesartan  (BENICAR) 20 MG tablet Take 20 mg by mouth every morning.       Marland Kitchen omeprazole (PRILOSEC) 40 MG capsule Take 40 mg by mouth 2 (two) times daily.       Marland Kitchen PROCTOZONE-HC 2.5 % rectal cream Place 1 application rectally 2 (two) times daily.       . sertraline (ZOLOFT) 25 MG tablet Take 25 mg by mouth daily.       . Verapamil HCl CR 300 MG CP24 Take 300 mg by mouth at bedtime.         Discontinued Meds:   There are no discontinued medications.  Patient Active Problem List  Diagnosis  . HYPOTHYROIDISM  . TOBACCO ABUSE  . RENAL DISEASE, CHRONIC, STAGE II  . Palpitations  . Arteriosclerotic cardiovascular disease (ASCVD)  . Hypertension  . Gastroesophageal reflux disease  . Hyperlipidemia  . Depression with anxiety    LABS Orders Only on 01/02/2012  Component Date Value  . Creat 01/02/2012 1.46*     Results for this Opt Visit:     Results for orders placed in visit on 01/02/12  CREATININE, SERUM  Component Value Range   Creat 1.46 (*) 0.50 - 1.10 mg/dL    EKG Orders placed in visit on 04/12/11  . EKG 12-LEAD     Prior Assessment and Plan Problem List as of 01/30/2012            Cardiology Problems   Arteriosclerotic cardiovascular disease (ASCVD)   Last Assessment & Plan Note   05/08/2011 Office Visit Signed 05/08/2011  2:59 PM by Lendon Colonel, NP    She is stable from cardiac standpoint. Symptoms do not related to cardiac disease. She will continue current medications without changes. Will see her in 6 months unless symptomatic. I have advised her that her maximum HR should be around 120 bpm.     Hypertension   Last Assessment & Plan Note   05/08/2011 Office Visit Signed 05/08/2011  3:01 PM by Lendon Colonel, NP    She insists that her BP is lower at home and she does not need to take the HCTZ. It is lower than when she was here last with the addition of benicar. Recheck has her BP at [id number] systolic. Will monitor this on follow-up appointments.    Hyperlipidemia       Other   HYPOTHYROIDISM   TOBACCO ABUSE   RENAL DISEASE, CHRONIC, STAGE II   Palpitations   Gastroesophageal reflux disease   Last Assessment & Plan Note   08/29/2011 Office Visit Signed 08/29/2011  4:06 PM by Andria Meuse, NP    Continue omeprazole 40 mg daily.  She is advised to remain on this while she is on prednisone.    Depression with anxiety       Imaging: Mr Lumbar Spine W Wo Contrast  01/03/2012  *RADIOLOGY REPORT*  Clinical Data: Rectal pain.  Evaluate for cauda quinine syndrome.  MRI LUMBAR SPINE WITHOUT AND WITH CONTRAST  Technique:  Multiplanar and multiecho pulse sequences of the lumbar spine were obtained without and with intravenous contrast.  Contrast: 86mL MULTIHANCE GADOBENATE DIMEGLUMINE 529 MG/ML IV SOLN  Comparison:  CT of the abdomen and pelvis with contrast 05/17/2011.  Findings: Normal signal is present in the conus medullaris which terminates at L1, within normal limits.  Marrow signal and vertebral body heights are normal.  There is slight retrolisthesis at L4-5.  Anterolisthesis at L5-S1 measures 4.5 mm.  Alignment is otherwise anatomic.  Cystic lesions in the kidneys are stable bilaterally.  The disc levels at L2-3 above are normal.  L3-4:  Minimal disc bulging is present.  There is no significant stenosis.  L4-5:  A slightly more prominent disc bulge is present.  Mild facet hypertrophy is evident.  There is no significant stenosis.  L5-S1:  Moderate facet hypertrophy is present, right greater than left.  There is a synovial cyst on the right without significant lateral recess narrowing.  There is some uncovering of the disc, but no significant stenosis.  The postcontrast images demonstrate no pathologic enhancement. Specifically, there is no enhancement of the terminal spinal cord or nerve roots.  IMPRESSION:  1.  Moderate facet hypertrophy at L5-S1 with associated anterolisthesis. 2.  Right-sided synovial cyst without significant lateral recess narrowing at L5-S1.  3.  Disc bulging at L3-4 and L4-5 without significant stenosis.   Original Report Authenticated By: Resa Miner. MATTERN, M.D.      [facility] Calculation: Score not calculated. Missing: Total Cholesterol

## 2012-01-30 NOTE — Assessment & Plan Note (Signed)
Euthyroid in recent years.

## 2012-02-15 ENCOUNTER — Encounter: Payer: Self-pay | Admitting: Pulmonary Disease

## 2012-02-15 ENCOUNTER — Telehealth (INDEPENDENT_AMBULATORY_CARE_PROVIDER_SITE_OTHER): Payer: Self-pay | Admitting: *Deleted

## 2012-02-15 LAB — PULMONARY FUNCTION TEST

## 2012-02-15 NOTE — Telephone Encounter (Signed)
I advised her to take the Proctofoam twice a day. She had not been using it.  PR Monday

## 2012-02-15 NOTE — Telephone Encounter (Signed)
[name] presented to the office. She states that she is having anal pain when she has a  bowel movement It feels like to her it is 2 iches up inside her rectum. The cream and suppository's she has isn't working up that far She takes Vicodin 5/500 and this helps the pain. She is asking if there is something that she can take generic that can help this. Can call into [facility] leave a message on her home phone [phone]

## 2012-04-23 ENCOUNTER — Other Ambulatory Visit (HOSPITAL_COMMUNITY): Payer: Self-pay | Admitting: Pulmonary Disease

## 2012-04-23 DIAGNOSIS — IMO0002 Reserved for concepts with insufficient information to code with codable children: Secondary | ICD-10-CM

## 2012-04-24 ENCOUNTER — Ambulatory Visit (HOSPITAL_COMMUNITY)
Admission: RE | Admit: 2012-04-24 | Discharge: 2012-04-24 | Disposition: A | Discharge status: Home / Self Care | Payer: Medicare Other | Source: Ambulatory Visit | Attending: Pulmonary Disease | Admitting: Pulmonary Disease

## 2012-04-24 DIAGNOSIS — IMO0002 Reserved for concepts with insufficient information to code with codable children: Secondary | ICD-10-CM

## 2012-04-24 DIAGNOSIS — R220 Localized swelling, mass and lump, head: Secondary | ICD-10-CM | POA: Insufficient documentation

## 2012-05-21 ENCOUNTER — Ambulatory Visit (INDEPENDENT_AMBULATORY_CARE_PROVIDER_SITE_OTHER): Payer: Medicare Other | Admitting: Internal Medicine

## 2012-05-21 ENCOUNTER — Encounter (INDEPENDENT_AMBULATORY_CARE_PROVIDER_SITE_OTHER): Payer: Self-pay | Admitting: Internal Medicine

## 2012-05-21 VITALS — BP 154/82 | HR 76 | Temp 97.4°F | Ht 60.0 in | Wt 156.0 lb

## 2012-05-21 DIAGNOSIS — K219 Gastro-esophageal reflux disease without esophagitis: Secondary | ICD-10-CM

## 2012-05-21 MED ORDER — PANTOPRAZOLE SODIUM 40 MG PO TBEC: 40.0000 mg | DELAYED_RELEASE_TABLET | Freq: Two times a day (BID) | ORAL | Status: DC

## 2012-05-21 NOTE — Patient Instructions (Addendum)
Will switch to Protonix 40mg  BID. OV in 1 month with me.  Stop the Prilosec

## 2012-05-21 NOTE — Progress Notes (Signed)
Subjective:     Patient ID: [name], female   DOB: [birth year], 77 y.o.   MRN: [id number]  HPI Presents today with c/o that she has a sorethroat. S 04/23/2012  US thyroid for nodules  .1. Heterogeneous thyroid parenchyma bilaterally without a focal  nodule.  2. The overall size of the thyroid is within normal limits to  small.   She tells me she has had a sorethroat x 2 months She says she is having acid reflux. She has acid reflux everyday.  Appetite is good for the most part. No weight loss. No abdominal pain. She usually has a BM every day. No melena or bright red rectal bleeding. She tells me she has chronic pain and takes Vicodin. Vicodin relieves the pain in her esophagus.  12/29/2011 Sigmoidoscopy: mpression:  Scattered diverticula at sigmoid colon.  Small external hemorrhoids.  No evidence of proctitis, polyps or tumor in the segments that were examined.     Review of Systems see hpi Current Outpatient Prescriptions  Medication Sig Dispense Refill  . ALPRAZolam (XANAX) 0.5 MG tablet Take 0.5 mg by mouth 4 (four) times daily as needed. For anxiety.      Marland Kitchen atorvastatin (LIPITOR) 10 MG tablet Take 10 mg by mouth at bedtime.       Marland Kitchen ESTRACE VAGINAL 0.1 MG/GM vaginal cream Place 2 g vaginally. She uses twice a week      . fish oil-omega-3 fatty acids 1000 MG capsule Take 2 g by mouth at bedtime.       . hydrochlorothiazide (HYDRODIURIL) 25 MG tablet Take 12.5 mg by mouth daily.      Marland Kitchen HYDROcodone-acetaminophen (VICODIN) 5-500 MG per tablet Take 1-1.5 tablets by mouth 3 (three) times daily. Patient states that she takes 1 1/2 in the morning , 1 during the day , and 1 1/2 at bedtime      . hydroxypropyl methylcellulose (ISOPTO TEARS) 2.5 % ophthalmic solution Place 1 drop into both eyes 3 (three) times daily as needed. For dry eyes.      Marland Kitchen levothyroxine (SYNTHROID, LEVOTHROID) 75 MCG tablet Take 75 mcg by mouth daily. *Takes on an empty stomach daily in the morning* *brand name  only per patient*      . multivitamin-lutein (OCUVITE-LUTEIN) CAPS Take 1 capsule by mouth daily.       Marland Kitchen olmesartan (BENICAR) 20 MG tablet Take 20 mg by mouth every morning.       Marland Kitchen omeprazole (PRILOSEC) 40 MG capsule Take 40 mg by mouth 2 (two) times daily.       Marland Kitchen PROCTOZONE-HC 2.5 % rectal cream Place 1 application rectally 2 (two) times daily.       . Verapamil HCl CR 300 MG CP24 Take 300 mg by mouth at bedtime.        No current facility-administered medications for this visit.   Past Medical History  Diagnosis Date  . Arteriosclerotic cardiovascular disease (ASCVD) 2006    Nonobstructive; < 50% lesions on cath 2002; negative stress nuclear study in 10/2004  . Hypertension   . COPD (chronic obstructive pulmonary disease)   . Colitis due to Clostridium difficile [place]  . Gastroesophageal reflux disease   . Hyperlipidemia   . Depression with anxiety   . Asthma   . Hypothyroidism   . S/P endoscopy 2009    linear esophageal erosions  . S/P colonoscopy 2007    few diverticula, otherwise nl  . S/P endoscopy 05/10/10  Question island of salmon-colored epithelium in distal esophagus ; no Barrett's.   . S/P colonoscopy 12/13/10    rectal, cecal polyp, left-sided diverticulosis, hyperplastic. ?anal fissure? treated empirically  . Chronic pelvic pain in female   . Tobacco abuse, in remission     55-pack-year consumption; discontinued 08/2010   Past Surgical History  Procedure Laterality Date  . Abdominal hysterectomy    . Cholecystectomy  1962  . Breast lumpectomy    . Cataract extraction    . Nissen fundoplication    . Colonoscopy  12/13/2010    Left-sided diverticulosis.  Cecal polyp, status post hot snare polypectomy/ Diminutive rectal polyp, status post cold biopsy removal tender/painful anal canal, ? occult anal fissure- Not visualized  . Esophagogastroduodenoscopy  05/10/10    benign mucosa with mild chronic inflammation.  . Flexible sigmoidoscopy  12/29/2011     Procedure: FLEXIBLE SIGMOIDOSCOPY;  Surgeon: Rogene Houston, MD;  Location: [facility];  Service: Endoscopy;  Laterality: N/A;  200-Ann notified pt to be here @ 1:00   Allergies  Allergen Reactions  . Paroxetine Itching  . Sulfa Antibiotics Other (See Comments)    Childhood reaction.  . Venlafaxine Itching  . Vicodin (Hydrocodone-Acetaminophen)   . Nalbuphine Rash        Objective:   Physical Exam  Filed Vitals:   05/21/12 0926  BP: 154/82  Pulse: 76  Temp: 97.4 F (36.3 C)  Height: 5' (1.524 m)  Weight: 156 lb (70.761 kg)   Alert and oriented. Skin warm and dry. Oral mucosa is moist.   . Sclera anicteric, conjunctivae is pink. Thyroid not enlarged. No cervical lymphadenopathy. Lungs clear. Heart regular rate and rhythm.  Abdomen is soft. Bowel sounds are positive. No hepatomegaly. No abdominal masses felt. No tenderness.  No edema to lower extremities. Patient is alert and oriented.     Assessment:    GERD. Uncontrolled at time.     Plan:     Will switch to Protonix 40mg  BID. OV in 1 month

## 2012-06-03 ENCOUNTER — Encounter: Payer: Self-pay | Admitting: Pulmonary Disease

## 2012-06-05 ENCOUNTER — Ambulatory Visit (HOSPITAL_COMMUNITY)
Admission: RE | Admit: 2012-06-05 | Discharge: 2012-06-05 | Disposition: A | Discharge status: Home / Self Care | Payer: Medicare Other | Source: Ambulatory Visit | Attending: Pulmonary Disease | Admitting: Pulmonary Disease

## 2012-06-05 ENCOUNTER — Other Ambulatory Visit (HOSPITAL_COMMUNITY): Payer: Self-pay | Admitting: Pulmonary Disease

## 2012-06-05 DIAGNOSIS — J449 Chronic obstructive pulmonary disease, unspecified: Secondary | ICD-10-CM | POA: Insufficient documentation

## 2012-06-05 DIAGNOSIS — J4489 Other specified chronic obstructive pulmonary disease: Secondary | ICD-10-CM | POA: Insufficient documentation

## 2012-06-05 DIAGNOSIS — J984 Other disorders of lung: Secondary | ICD-10-CM | POA: Insufficient documentation

## 2012-06-05 DIAGNOSIS — R0602 Shortness of breath: Secondary | ICD-10-CM

## 2012-06-05 DIAGNOSIS — J8410 Pulmonary fibrosis, unspecified: Secondary | ICD-10-CM | POA: Insufficient documentation

## 2012-06-18 ENCOUNTER — Ambulatory Visit (INDEPENDENT_AMBULATORY_CARE_PROVIDER_SITE_OTHER): Payer: Medicare Other | Admitting: Internal Medicine

## 2012-09-10 ENCOUNTER — Encounter: Payer: Self-pay | Admitting: Pulmonary Disease

## 2012-09-10 ENCOUNTER — Other Ambulatory Visit (HOSPITAL_COMMUNITY): Payer: Self-pay | Admitting: Pulmonary Disease

## 2012-09-10 DIAGNOSIS — R109 Unspecified abdominal pain: Secondary | ICD-10-CM

## 2012-09-12 ENCOUNTER — Ambulatory Visit (HOSPITAL_COMMUNITY)
Admission: RE | Admit: 2012-09-12 | Discharge: 2012-09-12 | Disposition: A | Discharge status: Home / Self Care | Payer: Medicare Other | Source: Ambulatory Visit | Attending: Pulmonary Disease | Admitting: Pulmonary Disease

## 2012-09-12 DIAGNOSIS — K5730 Diverticulosis of large intestine without perforation or abscess without bleeding: Secondary | ICD-10-CM | POA: Insufficient documentation

## 2012-09-12 DIAGNOSIS — R109 Unspecified abdominal pain: Secondary | ICD-10-CM

## 2012-09-12 MED ORDER — IOHEXOL 300 MG/ML  SOLN
80.0000 mL | Freq: Once | INTRAMUSCULAR | Status: AC | PRN
  Administered 2012-09-12: 80 mL via INTRAVENOUS

## 2012-09-20 ENCOUNTER — Encounter: Payer: Self-pay | Admitting: Pulmonary Disease

## 2012-10-17 ENCOUNTER — Encounter: Payer: Self-pay | Admitting: Pulmonary Disease

## 2013-01-03 ENCOUNTER — Other Ambulatory Visit (HOSPITAL_COMMUNITY): Payer: Self-pay | Admitting: Pulmonary Disease

## 2013-01-03 DIAGNOSIS — Z139 Encounter for screening, unspecified: Secondary | ICD-10-CM

## 2013-01-14 ENCOUNTER — Ambulatory Visit (INDEPENDENT_AMBULATORY_CARE_PROVIDER_SITE_OTHER): Payer: Medicare Other | Admitting: Urology

## 2013-01-14 ENCOUNTER — Encounter (INDEPENDENT_AMBULATORY_CARE_PROVIDER_SITE_OTHER): Payer: Self-pay

## 2013-01-14 DIAGNOSIS — R3915 Urgency of urination: Secondary | ICD-10-CM

## 2013-01-14 DIAGNOSIS — N949 Unspecified condition associated with female genital organs and menstrual cycle: Secondary | ICD-10-CM

## 2013-01-14 DIAGNOSIS — R350 Frequency of micturition: Secondary | ICD-10-CM

## 2013-01-16 ENCOUNTER — Ambulatory Visit (HOSPITAL_COMMUNITY)
Admission: RE | Admit: 2013-01-16 | Discharge: 2013-01-16 | Disposition: A | Discharge status: Home / Self Care | Payer: Medicare Other | Source: Ambulatory Visit | Attending: Pulmonary Disease | Admitting: Pulmonary Disease

## 2013-01-16 DIAGNOSIS — Z1231 Encounter for screening mammogram for malignant neoplasm of breast: Secondary | ICD-10-CM | POA: Insufficient documentation

## 2013-01-16 DIAGNOSIS — Z139 Encounter for screening, unspecified: Secondary | ICD-10-CM

## 2013-04-08 ENCOUNTER — Encounter: Payer: Self-pay | Admitting: Pulmonary Disease

## 2013-04-24 ENCOUNTER — Other Ambulatory Visit (INDEPENDENT_AMBULATORY_CARE_PROVIDER_SITE_OTHER): Payer: Self-pay | Admitting: Internal Medicine

## 2013-06-10 ENCOUNTER — Encounter: Payer: Self-pay | Admitting: Pulmonary Disease

## 2013-06-12 ENCOUNTER — Encounter: Payer: Self-pay | Admitting: Pulmonary Disease

## 2013-07-14 ENCOUNTER — Telehealth (INDEPENDENT_AMBULATORY_CARE_PROVIDER_SITE_OTHER): Payer: Self-pay | Admitting: *Deleted

## 2013-07-14 NOTE — Telephone Encounter (Signed)
Patient came by to speak with Tammy or Dr. Laural Golden. She is having right side abd pain and is wanting to have a full TCS done. Patient did schedule an apt for 11/03/13 at 3:00 with Dr. Laural Golden. Eriyonna said Karna Christmas would not be able to help her. The return phone number is [phone].

## 2013-07-14 NOTE — Telephone Encounter (Signed)
To Dr.Rehman

## 2013-07-14 NOTE — Telephone Encounter (Signed)
Forwarded to Dr.Rehman for review. 

## 2013-07-14 NOTE — Telephone Encounter (Signed)
Please call her colonoscopy not indicated until 2017

## 2013-07-16 NOTE — Telephone Encounter (Signed)
Patient was called and made aware. She ask that we cancel her appointment that she made for August 2015.

## 2013-07-16 NOTE — Telephone Encounter (Signed)
Apt has been canceled.

## 2013-08-28 ENCOUNTER — Encounter: Payer: Self-pay | Admitting: Pulmonary Disease

## 2013-09-24 ENCOUNTER — Encounter: Payer: Self-pay | Admitting: Pulmonary Disease

## 2013-11-03 ENCOUNTER — Ambulatory Visit (INDEPENDENT_AMBULATORY_CARE_PROVIDER_SITE_OTHER): Payer: Medicare Other | Admitting: Internal Medicine

## 2013-12-22 ENCOUNTER — Encounter: Payer: Self-pay | Admitting: Pulmonary Disease

## 2014-01-26 ENCOUNTER — Other Ambulatory Visit (HOSPITAL_COMMUNITY): Payer: Self-pay | Admitting: Pulmonary Disease

## 2014-01-26 DIAGNOSIS — Z1231 Encounter for screening mammogram for malignant neoplasm of breast: Secondary | ICD-10-CM

## 2014-01-28 ENCOUNTER — Other Ambulatory Visit (HOSPITAL_COMMUNITY): Payer: Self-pay | Admitting: Pulmonary Disease

## 2014-01-28 ENCOUNTER — Ambulatory Visit (HOSPITAL_COMMUNITY): Payer: Medicare Other

## 2014-01-28 DIAGNOSIS — N6320 Unspecified lump in the left breast, unspecified quadrant: Secondary | ICD-10-CM

## 2014-02-17 ENCOUNTER — Ambulatory Visit (HOSPITAL_COMMUNITY)
Admission: RE | Admit: 2014-02-17 | Discharge: 2014-02-17 | Disposition: A | Discharge status: Home / Self Care | Payer: Medicare Other | Source: Ambulatory Visit | Attending: Pulmonary Disease | Admitting: Pulmonary Disease

## 2014-02-17 ENCOUNTER — Other Ambulatory Visit (HOSPITAL_COMMUNITY): Payer: Self-pay | Admitting: Pulmonary Disease

## 2014-02-17 DIAGNOSIS — N6320 Unspecified lump in the left breast, unspecified quadrant: Secondary | ICD-10-CM

## 2014-02-17 DIAGNOSIS — N63 Unspecified lump in breast: Secondary | ICD-10-CM | POA: Insufficient documentation

## 2014-02-17 LAB — HM MAMMOGRAPHY

## 2014-03-18 ENCOUNTER — Encounter (INDEPENDENT_AMBULATORY_CARE_PROVIDER_SITE_OTHER): Payer: Self-pay

## 2014-05-25 ENCOUNTER — Ambulatory Visit (HOSPITAL_COMMUNITY)
Admission: RE | Admit: 2014-05-25 | Discharge: 2014-05-25 | Disposition: A | Discharge status: Home / Self Care | Payer: Medicare Other | Source: Ambulatory Visit | Attending: Ophthalmology | Admitting: Ophthalmology

## 2014-05-25 ENCOUNTER — Encounter (HOSPITAL_COMMUNITY): Payer: Self-pay | Admitting: *Deleted

## 2014-05-25 ENCOUNTER — Encounter (HOSPITAL_COMMUNITY)
Admission: RE | Disposition: A | Discharge status: Home / Self Care | Payer: Self-pay | Source: Ambulatory Visit | Attending: Ophthalmology

## 2014-05-25 DIAGNOSIS — H26492 Other secondary cataract, left eye: Secondary | ICD-10-CM | POA: Insufficient documentation

## 2014-05-25 HISTORY — PX: YAG LASER APPLICATION: SHX6189

## 2014-05-25 SURGERY — TREATMENT, USING YAG LASER
Anesthesia: LOCAL | Laterality: Left

## 2014-05-25 MED ORDER — TROPICAMIDE 1 % OP SOLN
1.0000 [drp] | OPHTHALMIC | Status: AC
  Administered 2014-05-25 (×3): 1 [drp] via OPHTHALMIC

## 2014-05-25 MED ORDER — TROPICAMIDE 1 % OP SOLN
OPHTHALMIC | Status: AC
  Filled 2014-05-25: qty 3

## 2014-05-25 MED ORDER — APRACLONIDINE HCL 1 % OP SOLN
OPHTHALMIC | Status: AC
  Filled 2014-05-25: qty 0.1

## 2014-05-25 MED ORDER — TETRACAINE HCL 0.5 % OP SOLN
1.0000 [drp] | Freq: Once | OPHTHALMIC | Status: AC
  Administered 2014-05-25: 1 [drp] via OPHTHALMIC

## 2014-05-25 MED ORDER — TETRACAINE HCL 0.5 % OP SOLN
OPHTHALMIC | Status: AC
  Filled 2014-05-25: qty 2

## 2014-05-25 MED ORDER — APRACLONIDINE HCL 1 % OP SOLN
1.0000 [drp] | OPHTHALMIC | Status: AC
  Administered 2014-05-25 (×3): 1 [drp] via OPHTHALMIC

## 2014-05-25 NOTE — Op Note (Signed)
Procedure none required

## 2014-05-25 NOTE — Brief Op Note (Signed)
[name] 05/25/2014  Williams Che, MD  Pre-op Diagnosis:  secondary cataract left eye  Post-op Diagnosis: secondary cataract left eye  Yag laser self-test completed: Yes.    Indications:  See preprinted/scanned office H&P  Procedure: YAG Laser posterior capsulotomy  OS  Eye protection worn by staff:  Yes.   Laser In Use sign on door:  Yes.    Laser:  {LUMENIS YAG/SLT Selecta Duet  Power Setting:  1.7 mJ/burst Anatomical site treated:  Opacified posteriorcapsule OS Number of applications:  47 Total energy delivered: 77.22 mJ Results:  Opacified posterior capsule with clear visual axis  Patient was instructed to go to the office, as previously scheduled, for intraocular pressure:  No.  Patient verbalizes understanding of discharge instructions:  Yes.     Patient tolerated procedure well, no complications

## 2014-05-25 NOTE — Discharge Instructions (Signed)
[name]  05/25/2014     Instructions    Activity: No Restrictions.   Diet: Resume Diet you were on at home.   Pain Medication: Tylenol if Needed.   CONTACT YOUR DOCTOR IF YOU HAVE PAIN, REDNESS IN YOUR EYE, OR DECREASED VISION.   Follow-up: 06/16/14 @ 10:15am with Williams Che, MD.     Dr. Iona Hansen: [phone]     If you find that you cannot contact your physician, but feel that your signs and   Symptoms warrant a physician's attention, call the Emergency Room at   [phone] ext.[phone].

## 2014-05-27 ENCOUNTER — Encounter (HOSPITAL_COMMUNITY): Payer: Self-pay | Admitting: Ophthalmology

## 2014-06-12 ENCOUNTER — Encounter: Payer: Self-pay | Admitting: Pulmonary Disease

## 2014-06-17 LAB — FECAL OCCULT BLOOD, GUAIAC: Fecal Occult Blood: NEGATIVE

## 2014-08-27 ENCOUNTER — Encounter: Payer: Self-pay | Admitting: Pulmonary Disease

## 2014-09-02 ENCOUNTER — Other Ambulatory Visit (HOSPITAL_COMMUNITY): Payer: Self-pay | Admitting: Pulmonary Disease

## 2014-09-02 DIAGNOSIS — R51 Headache: Principal | ICD-10-CM

## 2014-09-02 DIAGNOSIS — R519 Headache, unspecified: Secondary | ICD-10-CM

## 2014-09-16 ENCOUNTER — Ambulatory Visit (HOSPITAL_COMMUNITY): Payer: Medicare Other

## 2014-09-24 ENCOUNTER — Emergency Department (HOSPITAL_COMMUNITY): Payer: Medicare Other

## 2014-09-24 ENCOUNTER — Encounter (HOSPITAL_COMMUNITY): Payer: Self-pay | Admitting: Cardiology

## 2014-09-24 ENCOUNTER — Emergency Department (HOSPITAL_COMMUNITY)
Admission: EM | Admit: 2014-09-24 | Discharge: 2014-09-24 | Disposition: A | Discharge status: Home / Self Care | Payer: Medicare Other | Attending: Emergency Medicine | Admitting: Emergency Medicine

## 2014-09-24 DIAGNOSIS — R110 Nausea: Secondary | ICD-10-CM | POA: Insufficient documentation

## 2014-09-24 DIAGNOSIS — Z8619 Personal history of other infectious and parasitic diseases: Secondary | ICD-10-CM | POA: Diagnosis not present

## 2014-09-24 DIAGNOSIS — J449 Chronic obstructive pulmonary disease, unspecified: Secondary | ICD-10-CM | POA: Diagnosis not present

## 2014-09-24 DIAGNOSIS — Z87891 Personal history of nicotine dependence: Secondary | ICD-10-CM | POA: Diagnosis not present

## 2014-09-24 DIAGNOSIS — G8929 Other chronic pain: Secondary | ICD-10-CM

## 2014-09-24 DIAGNOSIS — Z79899 Other long term (current) drug therapy: Secondary | ICD-10-CM | POA: Diagnosis not present

## 2014-09-24 DIAGNOSIS — I2510 Atherosclerotic heart disease of native coronary artery without angina pectoris: Secondary | ICD-10-CM | POA: Insufficient documentation

## 2014-09-24 DIAGNOSIS — K219 Gastro-esophageal reflux disease without esophagitis: Secondary | ICD-10-CM | POA: Diagnosis not present

## 2014-09-24 DIAGNOSIS — I10 Essential (primary) hypertension: Secondary | ICD-10-CM | POA: Diagnosis not present

## 2014-09-24 DIAGNOSIS — R42 Dizziness and giddiness: Secondary | ICD-10-CM | POA: Diagnosis not present

## 2014-09-24 DIAGNOSIS — N390 Urinary tract infection, site not specified: Secondary | ICD-10-CM | POA: Insufficient documentation

## 2014-09-24 DIAGNOSIS — B029 Zoster without complications: Secondary | ICD-10-CM | POA: Diagnosis present

## 2014-09-24 DIAGNOSIS — E785 Hyperlipidemia, unspecified: Secondary | ICD-10-CM | POA: Insufficient documentation

## 2014-09-24 DIAGNOSIS — E039 Hypothyroidism, unspecified: Secondary | ICD-10-CM | POA: Diagnosis not present

## 2014-09-24 LAB — CBC WITH DIFFERENTIAL/PLATELET
Basophils Absolute: 0 10*3/uL (ref 0.0–0.1)
Basophils Relative: 0 % (ref 0–1)
Eosinophils Absolute: 0.1 10*3/uL (ref 0.0–0.7)
Eosinophils Relative: 1 % (ref 0–5)
HEMATOCRIT: 33.5 % — AB (ref 36.0–46.0)
Hemoglobin: 10.9 g/dL — ABNORMAL LOW (ref 12.0–15.0)
Lymphocytes Relative: 21 % (ref 12–46)
Lymphs Abs: 1.5 10*3/uL (ref 0.7–4.0)
MCH: 26.7 pg (ref 26.0–34.0)
MCHC: 32.5 g/dL (ref 30.0–36.0)
MCV: 82.1 fL (ref 78.0–100.0)
Monocytes Absolute: 0.4 10*3/uL (ref 0.1–1.0)
Monocytes Relative: 6 % (ref 3–12)
NEUTROS PCT: 72 % (ref 43–77)
Neutro Abs: 4.9 10*3/uL (ref 1.7–7.7)
Platelets: 261 10*3/uL (ref 150–400)
RBC: 4.08 MIL/uL (ref 3.87–5.11)
RDW: 15.9 % — ABNORMAL HIGH (ref 11.5–15.5)
WBC: 6.9 10*3/uL (ref 4.0–10.5)

## 2014-09-24 LAB — COMPREHENSIVE METABOLIC PANEL
ALT: 23 U/L (ref 14–54)
AST: 20 U/L (ref 15–41)
Albumin: 3.5 g/dL (ref 3.5–5.0)
Alkaline Phosphatase: 90 U/L (ref 38–126)
Anion gap: 9 (ref 5–15)
BUN: 26 mg/dL — AB (ref 6–20)
CALCIUM: 8.8 mg/dL — AB (ref 8.9–10.3)
CO2: 25 mmol/L (ref 22–32)
Chloride: 105 mmol/L (ref 101–111)
Creatinine, Ser: 1.47 mg/dL — ABNORMAL HIGH (ref 0.44–1.00)
GFR calc non Af Amer: 33 mL/min — ABNORMAL LOW (ref >60)
GFR, EST AFRICAN AMERICAN: 39 mL/min — AB (ref >60)
GLUCOSE: 105 mg/dL — AB (ref 65–99)
POTASSIUM: 3.6 mmol/L (ref 3.5–5.1)
SODIUM: 139 mmol/L (ref 135–145)
TOTAL PROTEIN: 6.8 g/dL (ref 6.5–8.1)
Total Bilirubin: 0.5 mg/dL (ref 0.3–1.2)

## 2014-09-24 LAB — URINALYSIS, ROUTINE W REFLEX MICROSCOPIC
Bilirubin Urine: NEGATIVE
Glucose, UA: NEGATIVE mg/dL
Ketones, ur: NEGATIVE mg/dL
NITRITE: POSITIVE — AB
PROTEIN: NEGATIVE mg/dL
SPECIFIC GRAVITY, URINE: 1.01 (ref 1.005–1.030)
UROBILINOGEN UA: 0.2 mg/dL (ref 0.0–1.0)
pH: 5 (ref 5.0–8.0)

## 2014-09-24 LAB — URINE MICROSCOPIC-ADD ON

## 2014-09-24 MED ORDER — SODIUM CHLORIDE 0.9 % IV SOLN
INTRAVENOUS | Status: DC
  Administered 2014-09-24: 09:00:00 via INTRAVENOUS

## 2014-09-24 MED ORDER — FENTANYL CITRATE (PF) 100 MCG/2ML IJ SOLN
50.0000 ug | Freq: Once | INTRAMUSCULAR | Status: AC
  Administered 2014-09-24: 50 ug via INTRAVENOUS
  Filled 2014-09-24: qty 2

## 2014-09-24 MED ORDER — SODIUM CHLORIDE 0.9 % IV BOLUS (SEPSIS)
500.0000 mL | Freq: Once | INTRAVENOUS | Status: AC
  Administered 2014-09-24: 500 mL via INTRAVENOUS

## 2014-09-24 MED ORDER — DEXTROSE 5 % IV SOLN
1.0000 g | Freq: Once | INTRAVENOUS | Status: AC
  Administered 2014-09-24: 1 g via INTRAVENOUS
  Filled 2014-09-24: qty 10

## 2014-09-24 MED ORDER — ONDANSETRON HCL 4 MG/2ML IJ SOLN
4.0000 mg | Freq: Once | INTRAMUSCULAR | Status: AC
  Administered 2014-09-24: 4 mg via INTRAVENOUS
  Filled 2014-09-24: qty 2

## 2014-09-24 MED ORDER — CEPHALEXIN 500 MG PO CAPS: 500.0000 mg | ORAL_CAPSULE | Freq: Four times a day (QID) | ORAL | Status: DC

## 2014-09-24 NOTE — ED Provider Notes (Addendum)
CSN: [id number]     Arrival date & time 09/24/14  0756 History  This chart was scribed for Fredia Sorrow, MD by Rayna Sexton, ED scribe. This patient was seen in room APA07/APA07 and the patient's care was started at 8:15 AM.    Chief Complaint  Patient presents with  . Nausea  . Herpes Zoster   The history is provided by the patient. No language interpreter was used.    HPI Comments: [name] is a 79 y.o. female who presents to the Emergency Department by ambulance complaining of intermittent, moderate, nausea and dizziness with onset 2 hours ago. Pt notes being diagnosed with shingles after visiting her PCP yesterday. She notes beginning her Neurontin Rx yesterday at 9:00 pm with her symptoms beginning 2 hours ago. Pt notes taking one dosage and not continuing her medication since the onset of her symptoms. She notes associated dry mouth, dizziness, and light headedness.  Pt also notes continued pain to her back with trouble walking due to her shingles which began 2 weeks ago. Pt notes an associated rash to her right side and right arm but notes that originally the redness began on her back. She notes receiving the shingles vaccine 2 years ago.   Pt notes being on oxycodone for the associated pain due to her shingles and further notes taking 10 mg hydrocodone for the chronic, generalized, pain across her body. She further notes receiving the oxycodone Rx from her PCP due to not experiencing a relief of pain from her shingles when taking her regular hydrocodone. She notes chronic, intermittent, dysuria and further notes a history of kidney issues. She denies this pain has recently worsened. She notes living with her husband and further notes he is able to assist her if necessary while at home. She denies blurred vision, fever, chills, cough, runny nose, sore throat, CP, SOB, abd pain, vomiting, diarrhea, dysuria, leg swelling, history of bleeding easily, and HA.   Past Medical History   Diagnosis Date  . Arteriosclerotic cardiovascular disease (ASCVD) 2006    Nonobstructive; < 50% lesions on cath 2002; negative stress nuclear study in 10/2004  . Hypertension   . COPD (chronic obstructive pulmonary disease)   . Colitis due to Clostridium difficile [place]  . Gastroesophageal reflux disease   . Hyperlipidemia   . Depression with anxiety   . Asthma   . Hypothyroidism   . S/P endoscopy 2009    linear esophageal erosions  . S/P colonoscopy 2007    few diverticula, otherwise nl  . S/P endoscopy 05/10/10    Question island of salmon-colored epithelium in distal esophagus ; no Barrett's.   . S/P colonoscopy 12/13/10    rectal, cecal polyp, left-sided diverticulosis, hyperplastic. ?anal fissure? treated empirically  . Chronic pelvic pain in female   . Tobacco abuse, in remission     55-pack-year consumption; discontinued 08/2010   Past Surgical History  Procedure Laterality Date  . Abdominal hysterectomy    . Cholecystectomy  1962  . Breast lumpectomy    . Cataract extraction    . Nissen fundoplication    . Colonoscopy  12/13/2010    Left-sided diverticulosis.  Cecal polyp, status post hot snare polypectomy/ Diminutive rectal polyp, status post cold biopsy removal tender/painful anal canal, ? occult anal fissure- Not visualized  . Esophagogastroduodenoscopy  05/10/10    benign mucosa with mild chronic inflammation.  . Flexible sigmoidoscopy  12/29/2011    Procedure: FLEXIBLE SIGMOIDOSCOPY;  Surgeon: Mechele Dawley  Laural Golden, MD;  Location: [facility];  Service: Endoscopy;  Laterality: N/A;  200-Ann notified pt to be here @ 1:00  . Yag laser application Left [id number]    Procedure: YAG LASER APPLICATION;  Surgeon: Williams Che, MD;  Location: [facility];  Service: Ophthalmology;  Laterality: Left;   Family History  Problem Relation Age of Onset  . Colon cancer Neg Hx   . Diabetes Mother   . Heart disease Father   . Hypertension Brother     also Sister x2  . Heart  disease Brother     Also mother, Father, brother and 2 sons  . Aneurysm Son   . Diabetes Son   . Alzheimer's disease Mother    History  Substance Use Topics  . Smoking status: Former Smoker -- 1.00 packs/day for 39 years    Quit date: 09/15/2010  . Smokeless tobacco: Never Used  . Alcohol Use: No   OB History    No data available     Review of Systems  Constitutional: Negative for fever and chills.  HENT: Negative for congestion, rhinorrhea, sneezing and sore throat.   Eyes: Negative for visual disturbance.  Respiratory: Negative for cough and shortness of breath.   Cardiovascular: Negative for chest pain and leg swelling.  Gastrointestinal: Positive for nausea. Negative for vomiting, abdominal pain and diarrhea.  Genitourinary: Negative for dysuria.  Musculoskeletal: Positive for myalgias.  Skin: Positive for rash.  Neurological: Positive for dizziness and light-headedness. Negative for headaches.  Psychiatric/Behavioral: Negative for confusion.      Allergies  Paroxetine; Sulfa antibiotics; Venlafaxine; and Nalbuphine  Home Medications   Prior to Admission medications   Medication Sig Start Date End Date Taking? Authorizing Provider  ALPRAZolam Duanne Moron) 0.5 MG tablet Take 0.5 mg by mouth 4 (four) times daily as needed for anxiety. For anxiety.   Yes Historical Provider, MD  atorvastatin (LIPITOR) 20 MG tablet Take 20 mg by mouth daily.   Yes Historical Provider, MD  ESTRACE VAGINAL 0.1 MG/GM vaginal cream Apply 1 application topically 3 (three) times a week. 06/30/14  Yes Historical Provider, MD  fish oil-omega-3 fatty acids 1000 MG capsule Take 2 g by mouth at bedtime.    Yes Historical Provider, MD  gabapentin (NEURONTIN) 300 MG capsule Take 1 capsule by mouth at bedtime. 09/23/14  Yes Historical Provider, MD  hydrochlorothiazide (HYDRODIURIL) 25 MG tablet Take 1 tablet by mouth daily. 09/18/14  Yes Historical Provider, MD  HYDROcodone-acetaminophen (NORCO) 10-325 MG per  tablet Take 1 tablet by mouth 4 (four) times daily. 08/28/14  Yes Historical Provider, MD  hydroxypropyl methylcellulose (ISOPTO TEARS) 2.5 % ophthalmic solution Place 1 drop into both eyes 3 (three) times daily as needed. For dry eyes.   Yes Historical Provider, MD  levothyroxine (SYNTHROID, LEVOTHROID) 75 MCG tablet Take 75 mcg by mouth daily. *Takes on an empty stomach daily in the morning* *brand name only per patient*   Yes Historical Provider, MD  Multiple Vitamins-Minerals (CENTRUM ADULTS PO) Take 1 tablet by mouth daily.   Yes Historical Provider, MD  omeprazole (PRILOSEC) 40 MG capsule Take 40 mg by mouth 2 (two) times daily.  11/28/10  Yes Historical Provider, MD  PROCTOZONE-HC 2.5 % rectal cream Place 1 application rectally at bedtime.  10/09/11  Yes Historical Provider, MD  Verapamil HCl CR 300 MG CP24 Take 1 capsule by mouth at bedtime. 09/18/14  Yes Historical Provider, MD   BP 142/74 mmHg  Pulse 64  Temp(Src) 97.7 F (36.5 C) (  Oral)  Resp 11  Ht 5' (1.524 m)  Wt 150 lb (68.04 kg)  BMI 29.30 kg/m2  SpO2 95% Physical Exam  Constitutional: She is oriented to person, place, and time. She appears well-developed and well-nourished. No distress.  HENT:  Head: Normocephalic and atraumatic.  Mucus membranes dry  Eyes: Conjunctivae and EOM are normal. Pupils are equal, round, and reactive to light. No scleral icterus.  Neck: Normal range of motion. Neck supple. No tracheal deviation present.  Cardiovascular: Normal rate, regular rhythm and normal heart sounds.   Pulmonary/Chest: Effort normal and breath sounds normal. No respiratory distress. She has no wheezes. She has no rales. She exhibits no tenderness.  Abdominal: Soft. Bowel sounds are normal. There is no tenderness.  Musculoskeletal: Normal range of motion. She exhibits no edema.  No swelling of ankles bilaterally  Neurological: She is alert and oriented to person, place, and time. No cranial nerve deficit. She exhibits normal  muscle tone. Coordination normal.  Skin: Skin is warm and dry. Rash noted.  Scabbed over rash which is 4x5 cm over the right shoulder blade region; Right side T6 area with a 2x2 cm additional dry patch;   Psychiatric: She has a normal mood and affect. Her behavior is normal.  Nursing note and vitals reviewed.   ED Course  Procedures  DIAGNOSTIC STUDIES: Oxygen Saturation is 98% on RA, normal by my interpretation.    COORDINATION OF CARE: 8:39 AM Discussed treatment plan with pt at bedside and pt agreed to plan.  Results for orders placed or performed during the hospital encounter of 09/24/14  CBC with Differential/Platelet  Result Value Ref Range   WBC 6.9 4.0 - 10.5 K/uL   RBC 4.08 3.87 - 5.11 MIL/uL   Hemoglobin 10.9 (L) 12.0 - 15.0 g/dL   HCT 33.5 (L) 36.0 - 46.0 %   MCV 82.1 78.0 - 100.0 fL   MCH 26.7 26.0 - 34.0 pg   MCHC 32.5 30.0 - 36.0 g/dL   RDW 15.9 (H) 11.5 - 15.5 %   Platelets 261 150 - 400 K/uL   Neutrophils Relative % 72 43 - 77 %   Neutro Abs 4.9 1.7 - 7.7 K/uL   Lymphocytes Relative 21 12 - 46 %   Lymphs Abs 1.5 0.7 - 4.0 K/uL   Monocytes Relative 6 3 - 12 %   Monocytes Absolute 0.4 0.1 - 1.0 K/uL   Eosinophils Relative 1 0 - 5 %   Eosinophils Absolute 0.1 0.0 - 0.7 K/uL   Basophils Relative 0 0 - 1 %   Basophils Absolute 0.0 0.0 - 0.1 K/uL  Comprehensive metabolic panel  Result Value Ref Range   Sodium 139 135 - 145 mmol/L   Potassium 3.6 3.5 - 5.1 mmol/L   Chloride 105 101 - 111 mmol/L   CO2 25 22 - 32 mmol/L   Glucose, Bld 105 (H) 65 - 99 mg/dL   BUN 26 (H) 6 - 20 mg/dL   Creatinine, Ser 1.47 (H) 0.44 - 1.00 mg/dL   Calcium 8.8 (L) 8.9 - 10.3 mg/dL   Total Protein 6.8 6.5 - 8.1 g/dL   Albumin 3.5 3.5 - 5.0 g/dL   AST 20 15 - 41 U/L   ALT 23 14 - 54 U/L   Alkaline Phosphatase 90 38 - 126 U/L   Total Bilirubin 0.5 0.3 - 1.2 mg/dL   GFR calc non Af Amer 33 (L) >60 mL/min   GFR calc Af Amer 39 (L) >60 mL/min  Anion gap 9 5 - 15  Urinalysis,  Routine w reflex microscopic (not at [facility])  Result Value Ref Range   Color, Urine YELLOW YELLOW   APPearance CLEAR CLEAR   Specific Gravity, Urine 1.010 1.005 - 1.030   pH 5.0 5.0 - 8.0   Glucose, UA NEGATIVE NEGATIVE mg/dL   Hgb urine dipstick SMALL (A) NEGATIVE   Bilirubin Urine NEGATIVE NEGATIVE   Ketones, ur NEGATIVE NEGATIVE mg/dL   Protein, ur NEGATIVE NEGATIVE mg/dL   Urobilinogen, UA 0.2 0.0 - 1.0 mg/dL   Nitrite POSITIVE (A) NEGATIVE   Leukocytes, UA LARGE (A) NEGATIVE  Urine microscopic-add on  Result Value Ref Range   WBC, UA TOO NUMEROUS TO COUNT <3 WBC/hpf   RBC / HPF 7-10 <3 RBC/hpf   Bacteria, UA MANY (A) RARE   Dg Chest 2 View  09/24/2014   CLINICAL DATA:  79 year old female with a history of nausea and dizziness for 2 hr. History of shingles  EXAM: CHEST - 2 VIEW  COMPARISON:  06/05/2012, 12/22/2011, 09/20/2009, 06/16/2009  FINDINGS: Cardiomediastinal silhouette unchanged. No evidence of pulmonary vascular congestion.  Calcifications of the aortic arch.  No pneumothorax or pleural effusion.  Stigmata of emphysema, with increased retrosternal airspace, flattened hemidiaphragms, increased AP diameter, and hyperinflation on the AP view.  No confluent airspace disease.  Chronic interstitial opacities, similar to the comparison. Nodule of the right upper lobe is unchanged over the course of several examinations, likely a partially calcified granuloma.  No displaced fracture. Accentuated thoracic kyphotic curvature is unchanged.  Surgical changes in the epigastric region, or low mediastinum  IMPRESSION: No radiographic evidence of acute cardiopulmonary disease, with background of chronic changes.  Atherosclerosis.  Signed,  Dulcy Fanny. Earleen Newport, DO  [facility]  [facility]   Electronically Signed   By: Corrie Mckusick D.O.   On: 09/24/2014 11:10   Ct Head Wo Contrast  09/24/2014   CLINICAL DATA:  79 year old female with a history of  shingles.  EXAM: CT HEAD WITHOUT CONTRAST  TECHNIQUE: Contiguous axial images were obtained from the base of the skull through the vertex without intravenous contrast.  COMPARISON:  None.  FINDINGS: Unremarkable appearance of the calvarium without acute fracture or aggressive lesion.  Unremarkable appearance of the scalp soft tissues.  Unremarkable appearance of the bilateral orbits.  Bilateral lens extraction.  Mastoid air cells are clear.  No significant paranasal sinus disease  No acute intracranial hemorrhage, midline shift, or mass effect.  Gray-white differentiation is maintained, without CT evidence of acute ischemia.  Unremarkable configuration of the ventricles.  Hypodense regions in the bilateral periventricular white matter. Intracranial atherosclerotic calcifications.  IMPRESSION: No CT evidence of acute intracranial abnormality.  Changes of chronic microvascular ischemic disease with associated intracranial atherosclerosis.  Signed,  Dulcy Fanny. Earleen Newport, DO  [facility]  [facility]   Electronically Signed   By: Corrie Mckusick D.O.   On: 09/24/2014 11:25     MDM   Final diagnoses:  Dizziness  Shingles  Chronic pain  UTI (lower urinary tract infection)    Dizziness and earlier symptoms have resolved. Other symptoms include her baseline chronic pain and exacerbation of that pain due to of right-sided T6 shingles. Workup here to include CT chest was done because the dizziness symptoms were persisting however they were all negative and now they resolved. Will not go on with MRI during this setting. However symptoms persist or reoccur may need that.  Overall suspect that  symptoms had something to do with the gabapentin the patient took for the first time last night to 300 mg. Suspect that could've been side effect of that medicine took a little longer to wear often expected but L symptoms have resolved. Patient's pain feels better. Patient has  hydrocodone at home for her chronic pain. Will have patient stop taking the gabapentin.   Addendum: In addition patient did have urinary tract infection urine culture has been sent patient treated with Rocephin here meant to discharge her home with Keflex. Forgot to provide that prescription. Patient will be notified to come and pick up the prescription.  I personally performed the services described in this documentation, which was scribed in my presence. The recorded information has been reviewed and is accurate.     Fredia Sorrow, MD 09/24/14 [place], MD 09/24/14 1442  Fredia Sorrow, MD 09/24/14 [phone]

## 2014-09-24 NOTE — ED Notes (Signed)
Diagnosed with shingles .  Placed on gabapentin yesterday.  Started  Rx last night.    Now has nausea and dizziness since 630 am.

## 2014-09-24 NOTE — Discharge Instructions (Signed)
Continue her hydrocodone at home for the pain. Return for any new or worse symptoms. Follow-up with your doctors as needed.

## 2014-09-24 NOTE — ED Notes (Signed)
Patient ambulatory to restroom with 1 assist. UA specimen collected.

## 2014-09-26 LAB — URINE CULTURE: Culture: >=100000

## 2014-09-27 ENCOUNTER — Telehealth: Payer: Self-pay | Admitting: Emergency Medicine

## 2014-09-27 NOTE — Telephone Encounter (Signed)
Post ED Visit - Positive Culture Follow-up  Culture report reviewed by antimicrobial stewardship pharmacist: []  Wes Dulaney, Pharm.D., BCPS [x]  Heide Guile, Pharm.D., BCPS []  Alycia Rossetti, Pharm.D., BCPS []  [place], Pharm.D., BCPS, AAHIVP []  Legrand Como, Pharm.D., BCPS, AAHIVP []  Isac Sarna, Pharm.D., BCPS  Positive Urine culture Treated with Cephalexin, organism sensitive to the same and no further patient follow-up is required at this time.  Ernesta Amble 09/27/2014, 4:34 PM

## 2014-10-01 ENCOUNTER — Ambulatory Visit (HOSPITAL_COMMUNITY)
Admission: RE | Admit: 2014-10-01 | Discharge: 2014-10-01 | Disposition: A | Discharge status: Home / Self Care | Payer: Medicare Other | Source: Ambulatory Visit | Attending: Pulmonary Disease | Admitting: Pulmonary Disease

## 2014-10-01 DIAGNOSIS — R519 Headache, unspecified: Secondary | ICD-10-CM

## 2014-10-01 DIAGNOSIS — R51 Headache: Secondary | ICD-10-CM | POA: Insufficient documentation

## 2014-10-01 DIAGNOSIS — G319 Degenerative disease of nervous system, unspecified: Secondary | ICD-10-CM | POA: Insufficient documentation

## 2014-11-13 ENCOUNTER — Other Ambulatory Visit (INDEPENDENT_AMBULATORY_CARE_PROVIDER_SITE_OTHER): Payer: Self-pay | Admitting: Internal Medicine

## 2014-11-14 ENCOUNTER — Encounter (HOSPITAL_COMMUNITY): Payer: Self-pay | Admitting: Cardiology

## 2014-11-14 ENCOUNTER — Emergency Department (HOSPITAL_COMMUNITY)
Admission: EM | Admit: 2014-11-14 | Discharge: 2014-11-14 | Disposition: A | Discharge status: Home / Self Care | Payer: Medicare Other | Attending: Emergency Medicine | Admitting: Emergency Medicine

## 2014-11-14 DIAGNOSIS — I2510 Atherosclerotic heart disease of native coronary artery without angina pectoris: Secondary | ICD-10-CM | POA: Diagnosis not present

## 2014-11-14 DIAGNOSIS — I10 Essential (primary) hypertension: Secondary | ICD-10-CM | POA: Diagnosis not present

## 2014-11-14 DIAGNOSIS — Y9389 Activity, other specified: Secondary | ICD-10-CM | POA: Insufficient documentation

## 2014-11-14 DIAGNOSIS — Z8619 Personal history of other infectious and parasitic diseases: Secondary | ICD-10-CM | POA: Diagnosis not present

## 2014-11-14 DIAGNOSIS — F418 Other specified anxiety disorders: Secondary | ICD-10-CM | POA: Insufficient documentation

## 2014-11-14 DIAGNOSIS — G8929 Other chronic pain: Secondary | ICD-10-CM | POA: Insufficient documentation

## 2014-11-14 DIAGNOSIS — S4991XA Unspecified injury of right shoulder and upper arm, initial encounter: Secondary | ICD-10-CM | POA: Insufficient documentation

## 2014-11-14 DIAGNOSIS — Z87891 Personal history of nicotine dependence: Secondary | ICD-10-CM | POA: Diagnosis not present

## 2014-11-14 DIAGNOSIS — K219 Gastro-esophageal reflux disease without esophagitis: Secondary | ICD-10-CM | POA: Insufficient documentation

## 2014-11-14 DIAGNOSIS — Y9289 Other specified places as the place of occurrence of the external cause: Secondary | ICD-10-CM | POA: Insufficient documentation

## 2014-11-14 DIAGNOSIS — Z79899 Other long term (current) drug therapy: Secondary | ICD-10-CM | POA: Insufficient documentation

## 2014-11-14 DIAGNOSIS — Z792 Long term (current) use of antibiotics: Secondary | ICD-10-CM | POA: Diagnosis not present

## 2014-11-14 DIAGNOSIS — M545 Low back pain, unspecified: Secondary | ICD-10-CM

## 2014-11-14 DIAGNOSIS — J449 Chronic obstructive pulmonary disease, unspecified: Secondary | ICD-10-CM | POA: Insufficient documentation

## 2014-11-14 DIAGNOSIS — S3992XA Unspecified injury of lower back, initial encounter: Secondary | ICD-10-CM | POA: Diagnosis present

## 2014-11-14 DIAGNOSIS — E039 Hypothyroidism, unspecified: Secondary | ICD-10-CM | POA: Diagnosis not present

## 2014-11-14 DIAGNOSIS — S4992XA Unspecified injury of left shoulder and upper arm, initial encounter: Secondary | ICD-10-CM | POA: Diagnosis not present

## 2014-11-14 DIAGNOSIS — Y998 Other external cause status: Secondary | ICD-10-CM | POA: Diagnosis not present

## 2014-11-14 HISTORY — DX: Zoster without complications: B02.9

## 2014-11-14 MED ORDER — DIAZEPAM 2 MG PO TABS
2.0000 mg | ORAL_TABLET | Freq: Once | ORAL | Status: AC
Start: 2014-11-14 — End: 2014-11-14
  Administered 2014-11-14: 2 mg via ORAL
  Filled 2014-11-14: qty 1

## 2014-11-14 MED ORDER — KETOROLAC TROMETHAMINE 60 MG/2ML IM SOLN
30.0000 mg | Freq: Once | INTRAMUSCULAR | Status: AC
  Administered 2014-11-14: 30 mg via INTRAMUSCULAR
  Filled 2014-11-14: qty 2
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
## 2014-11-14 NOTE — ED Notes (Signed)
Patient with no complaints at this time. Respirations even and unlabored. Skin warm/dry. Discharge instructions reviewed with patient at this time. Patient given opportunity to voice concerns/ask questions. Patient discharged at this time and left Emergency Department with steady gait.   
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
## 2014-11-25 ENCOUNTER — Encounter: Payer: Self-pay | Admitting: Pulmonary Disease

## 2014-12-03 ENCOUNTER — Other Ambulatory Visit (HOSPITAL_COMMUNITY): Payer: Self-pay | Admitting: Pulmonary Disease

## 2014-12-03 DIAGNOSIS — R599 Enlarged lymph nodes, unspecified: Secondary | ICD-10-CM

## 2014-12-09 ENCOUNTER — Other Ambulatory Visit (HOSPITAL_COMMUNITY): Payer: Self-pay | Admitting: Pulmonary Disease

## 2014-12-09 ENCOUNTER — Ambulatory Visit (HOSPITAL_COMMUNITY)
Admission: RE | Admit: 2014-12-09 | Discharge: 2014-12-09 | Disposition: A | Discharge status: Home / Self Care | Payer: Medicare Other | Source: Ambulatory Visit | Attending: Pulmonary Disease | Admitting: Pulmonary Disease

## 2014-12-09 DIAGNOSIS — R599 Enlarged lymph nodes, unspecified: Secondary | ICD-10-CM

## 2014-12-09 DIAGNOSIS — R221 Localized swelling, mass and lump, neck: Secondary | ICD-10-CM | POA: Diagnosis present

## 2014-12-09 LAB — POCT I-STAT CREATININE: Creatinine, Ser: 1.5 mg/dL — ABNORMAL HIGH (ref 0.44–1.00)

## 2014-12-09 MED ORDER — IOHEXOL 300 MG/ML  SOLN
75.0000 mL | Freq: Once | INTRAMUSCULAR | Status: AC | PRN
  Administered 2014-12-09: 75 mL via INTRAVENOUS

## 2015-03-11 ENCOUNTER — Encounter: Payer: Self-pay | Admitting: Pulmonary Disease

## 2015-04-13 ENCOUNTER — Telehealth (HOSPITAL_COMMUNITY): Payer: Self-pay | Admitting: *Deleted

## 2015-05-20 ENCOUNTER — Encounter: Payer: Self-pay | Admitting: Pulmonary Disease

## 2015-05-25 ENCOUNTER — Other Ambulatory Visit (HOSPITAL_COMMUNITY): Payer: Self-pay | Admitting: Pulmonary Disease

## 2015-05-25 DIAGNOSIS — Z1231 Encounter for screening mammogram for malignant neoplasm of breast: Secondary | ICD-10-CM

## 2015-05-25 DIAGNOSIS — Z780 Asymptomatic menopausal state: Secondary | ICD-10-CM

## 2015-05-26 ENCOUNTER — Emergency Department (HOSPITAL_COMMUNITY): Payer: Medicare Other

## 2015-05-26 ENCOUNTER — Inpatient Hospital Stay (HOSPITAL_COMMUNITY)
Admission: EM | Admit: 2015-05-26 | Discharge: 2015-05-29 | DRG: 563 | Disposition: A | Discharge status: Home Health | Payer: Medicare Other | Attending: General Surgery | Admitting: General Surgery

## 2015-05-26 ENCOUNTER — Encounter (HOSPITAL_COMMUNITY): Payer: Self-pay

## 2015-05-26 DIAGNOSIS — F418 Other specified anxiety disorders: Secondary | ICD-10-CM | POA: Diagnosis present

## 2015-05-26 DIAGNOSIS — D62 Acute posthemorrhagic anemia: Secondary | ICD-10-CM | POA: Diagnosis present

## 2015-05-26 DIAGNOSIS — K219 Gastro-esophageal reflux disease without esophagitis: Secondary | ICD-10-CM | POA: Diagnosis present

## 2015-05-26 DIAGNOSIS — I129 Hypertensive chronic kidney disease with stage 1 through stage 4 chronic kidney disease, or unspecified chronic kidney disease: Secondary | ICD-10-CM | POA: Diagnosis present

## 2015-05-26 DIAGNOSIS — N183 Chronic kidney disease, stage 3 (moderate): Secondary | ICD-10-CM | POA: Diagnosis present

## 2015-05-26 DIAGNOSIS — S2241XA Multiple fractures of ribs, right side, initial encounter for closed fracture: Secondary | ICD-10-CM | POA: Diagnosis present

## 2015-05-26 DIAGNOSIS — E785 Hyperlipidemia, unspecified: Secondary | ICD-10-CM | POA: Diagnosis present

## 2015-05-26 DIAGNOSIS — S2221XA Fracture of manubrium, initial encounter for closed fracture: Secondary | ICD-10-CM

## 2015-05-26 DIAGNOSIS — G8929 Other chronic pain: Secondary | ICD-10-CM | POA: Diagnosis present

## 2015-05-26 DIAGNOSIS — J449 Chronic obstructive pulmonary disease, unspecified: Secondary | ICD-10-CM | POA: Diagnosis present

## 2015-05-26 DIAGNOSIS — Z87891 Personal history of nicotine dependence: Secondary | ICD-10-CM

## 2015-05-26 DIAGNOSIS — R102 Pelvic and perineal pain: Secondary | ICD-10-CM | POA: Diagnosis present

## 2015-05-26 DIAGNOSIS — S2220XA Unspecified fracture of sternum, initial encounter for closed fracture: Secondary | ICD-10-CM | POA: Diagnosis present

## 2015-05-26 DIAGNOSIS — S2231XA Fracture of one rib, right side, initial encounter for closed fracture: Secondary | ICD-10-CM | POA: Diagnosis present

## 2015-05-26 DIAGNOSIS — S52601A Unspecified fracture of lower end of right ulna, initial encounter for closed fracture: Principal | ICD-10-CM | POA: Diagnosis present

## 2015-05-26 DIAGNOSIS — E039 Hypothyroidism, unspecified: Secondary | ICD-10-CM | POA: Diagnosis present

## 2015-05-26 DIAGNOSIS — Z8249 Family history of ischemic heart disease and other diseases of the circulatory system: Secondary | ICD-10-CM | POA: Diagnosis not present

## 2015-05-26 DIAGNOSIS — S52201A Unspecified fracture of shaft of right ulna, initial encounter for closed fracture: Secondary | ICD-10-CM | POA: Diagnosis present

## 2015-05-26 LAB — I-STAT CHEM 8, ED
BUN: 32 mg/dL — ABNORMAL HIGH (ref 6–20)
CALCIUM ION: 1.13 mmol/L (ref 1.13–1.30)
CHLORIDE: 104 mmol/L (ref 101–111)
Creatinine, Ser: 1.5 mg/dL — ABNORMAL HIGH (ref 0.44–1.00)
GLUCOSE: 127 mg/dL — AB (ref 65–99)
HCT: 39 % (ref 36.0–46.0)
HEMOGLOBIN: 13.3 g/dL (ref 12.0–15.0)
POTASSIUM: 3.6 mmol/L (ref 3.5–5.1)
SODIUM: 142 mmol/L (ref 135–145)
TCO2: 25 mmol/L (ref 0–100)

## 2015-05-26 MED ORDER — IOHEXOL 300 MG/ML  SOLN
60.0000 mL | Freq: Once | INTRAMUSCULAR | Status: AC | PRN
Start: 2015-05-26 — End: 2015-05-26
  Administered 2015-05-26: 60 mL via INTRAVENOUS

## 2015-05-26 MED ORDER — ONDANSETRON HCL 4 MG/2ML IJ SOLN
4.0000 mg | Freq: Once | INTRAMUSCULAR | Status: AC
  Administered 2015-05-26: 4 mg via INTRAVENOUS

## 2015-05-26 MED ORDER — FENTANYL CITRATE (PF) 100 MCG/2ML IJ SOLN
INTRAMUSCULAR | Status: AC
  Filled 2015-05-26: qty 2

## 2015-05-26 MED ORDER — SODIUM CHLORIDE 0.9 % IV BOLUS (SEPSIS)
500.0000 mL | Freq: Once | INTRAVENOUS | Status: AC
  Administered 2015-05-26: 500 mL via INTRAVENOUS

## 2015-05-26 MED ORDER — ONDANSETRON HCL 4 MG/2ML IJ SOLN
INTRAMUSCULAR | Status: AC
  Filled 2015-05-26: qty 2

## 2015-05-26 MED ORDER — MORPHINE SULFATE (PF) 4 MG/ML IV SOLN
6.0000 mg | Freq: Once | INTRAVENOUS | Status: AC
  Administered 2015-05-26: 4 mg via INTRAVENOUS

## 2015-05-26 MED ORDER — OXYCODONE HCL 5 MG PO TABS
5.0000 mg | ORAL_TABLET | ORAL | Status: DC | PRN
  Administered 2015-05-27 – 2015-05-28 (×3): 5 mg via ORAL
  Filled 2015-05-26 (×4): qty 1

## 2015-05-26 MED ORDER — MORPHINE SULFATE (PF) 2 MG/ML IV SOLN
2.0000 mg | INTRAVENOUS | Status: DC | PRN
  Administered 2015-05-26: 4 mg via INTRAVENOUS
  Administered 2015-05-27: 2 mg via INTRAVENOUS
  Administered 2015-05-27 (×2): 4 mg via INTRAVENOUS
  Administered 2015-05-27 (×2): 2 mg via INTRAVENOUS
  Administered 2015-05-27 – 2015-05-28 (×2): 4 mg via INTRAVENOUS
  Administered 2015-05-28: 2 mg via INTRAVENOUS
  Filled 2015-05-26 (×3): qty 2
  Filled 2015-05-26 (×2): qty 1
  Filled 2015-05-26 (×3): qty 2
  Filled 2015-05-26: qty 1

## 2015-05-26 MED ORDER — FENTANYL CITRATE (PF) 100 MCG/2ML IJ SOLN
50.0000 ug | Freq: Once | INTRAMUSCULAR | Status: AC
  Administered 2015-05-26: 50 ug via INTRAVENOUS
  Filled 2015-05-26: qty 2

## 2015-05-26 MED ORDER — ENOXAPARIN SODIUM 40 MG/0.4ML ~~LOC~~ SOLN
40.0000 mg | SUBCUTANEOUS | Status: DC
  Administered 2015-05-27: 40 mg via SUBCUTANEOUS
  Filled 2015-05-26: qty 0.4

## 2015-05-26 MED ORDER — MORPHINE SULFATE (PF) 4 MG/ML IV SOLN
INTRAVENOUS | Status: AC
  Filled 2015-05-26: qty 1

## 2015-05-26 MED ORDER — FENTANYL CITRATE (PF) 100 MCG/2ML IJ SOLN
50.0000 ug | Freq: Once | INTRAMUSCULAR | Status: AC
  Administered 2015-05-26: 50 ug via INTRAVENOUS

## 2015-05-26 MED ORDER — ONDANSETRON HCL 4 MG/2ML IJ SOLN
4.0000 mg | Freq: Four times a day (QID) | INTRAMUSCULAR | Status: DC | PRN
  Administered 2015-05-26 – 2015-05-28 (×4): 4 mg via INTRAVENOUS
  Filled 2015-05-26 (×4): qty 2

## 2015-05-26 MED ORDER — MORPHINE SULFATE (PF) 4 MG/ML IV SOLN
4.0000 mg | Freq: Once | INTRAVENOUS | Status: AC
  Administered 2015-05-26: 4 mg via INTRAVENOUS
  Filled 2015-05-26: qty 1

## 2015-05-26 MED ORDER — ONDANSETRON HCL 4 MG PO TABS
4.0000 mg | ORAL_TABLET | Freq: Four times a day (QID) | ORAL | Status: DC | PRN
  Administered 2015-05-28 – 2015-05-29 (×3): 4 mg via ORAL
  Filled 2015-05-26 (×3): qty 1
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
## 2015-05-26 NOTE — ED Notes (Signed)
Morphine changed to 4 mg.

## 2015-05-26 NOTE — ED Notes (Signed)
Dr. Dayna Barker informed of pt's creatinine level.
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
## 2015-05-27 LAB — COMPREHENSIVE METABOLIC PANEL
ALBUMIN: 3.5 g/dL (ref 3.5–5.0)
ALT: 86 U/L — AB (ref 14–54)
AST: 87 U/L — AB (ref 15–41)
Alkaline Phosphatase: 83 U/L (ref 38–126)
Anion gap: 12 (ref 5–15)
BUN: 26 mg/dL — AB (ref 6–20)
CHLORIDE: 105 mmol/L (ref 101–111)
CO2: 24 mmol/L (ref 22–32)
CREATININE: 1.62 mg/dL — AB (ref 0.44–1.00)
Calcium: 9.4 mg/dL (ref 8.9–10.3)
GFR calc Af Amer: 34 mL/min — ABNORMAL LOW (ref >60)
GFR calc non Af Amer: 30 mL/min — ABNORMAL LOW (ref >60)
Glucose, Bld: 109 mg/dL — ABNORMAL HIGH (ref 65–99)
POTASSIUM: 4 mmol/L (ref 3.5–5.1)
SODIUM: 141 mmol/L (ref 135–145)
Total Bilirubin: 0.5 mg/dL (ref 0.3–1.2)
Total Protein: 6.5 g/dL (ref 6.5–8.1)

## 2015-05-27 LAB — CBC
HCT: 32.3 % — ABNORMAL LOW (ref 36.0–46.0)
HCT: 33.3 % — ABNORMAL LOW (ref 36.0–46.0)
HEMOGLOBIN: 10.5 g/dL — AB (ref 12.0–15.0)
Hemoglobin: 11.2 g/dL — ABNORMAL LOW (ref 12.0–15.0)
MCH: 28.1 pg (ref 26.0–34.0)
MCH: 27.1 pg (ref 26.0–34.0)
MCHC: 32.5 g/dL (ref 30.0–36.0)
MCHC: 33.6 g/dL (ref 30.0–36.0)
MCV: 83.5 fL (ref 78.0–100.0)
MCV: 83.7 fL (ref 78.0–100.0)
PLATELETS: 175 10*3/uL (ref 150–400)
Platelets: 181 10*3/uL (ref 150–400)
RBC: 3.87 MIL/uL (ref 3.87–5.11)
RBC: 3.98 MIL/uL (ref 3.87–5.11)
RDW: 15.5 % (ref 11.5–15.5)
RDW: 15.7 % — AB (ref 11.5–15.5)
WBC: 7 10*3/uL (ref 4.0–10.5)
WBC: 7.9 10*3/uL (ref 4.0–10.5)

## 2015-05-27 LAB — CREATININE, SERUM
CREATININE: 1.64 mg/dL — AB (ref 0.44–1.00)
GFR, EST AFRICAN AMERICAN: 34 mL/min — AB (ref >60)
GFR, EST NON AFRICAN AMERICAN: 29 mL/min — AB (ref >60)

## 2015-05-27 MED ORDER — DEXTROSE-NACL 5-0.9 % IV SOLN: INTRAVENOUS | Status: DC

## 2015-05-27 MED ORDER — BISACODYL 10 MG RE SUPP
10.0000 mg | Freq: Once | RECTAL | Status: DC
  Filled 2015-05-27: qty 1

## 2015-05-27 MED ORDER — VERAPAMIL HCL ER 300 MG PO CP24: 1.0000 | ORAL_CAPSULE | Freq: Every day | ORAL | Status: DC

## 2015-05-27 MED ORDER — VERAPAMIL HCL ER 120 MG PO TBCR
300.0000 mg | EXTENDED_RELEASE_TABLET | Freq: Every day | ORAL | Status: DC
  Administered 2015-05-27 – 2015-05-28 (×2): 300 mg via ORAL
  Filled 2015-05-27 (×2): qty 1

## 2015-05-27 MED ORDER — PANTOPRAZOLE SODIUM 40 MG PO TBEC
40.0000 mg | DELAYED_RELEASE_TABLET | Freq: Every day | ORAL | Status: DC
  Administered 2015-05-27: 40 mg via ORAL
  Filled 2015-05-27: qty 1

## 2015-05-27 MED ORDER — HYDROCODONE-ACETAMINOPHEN 10-325 MG PO TABS
1.0000 | ORAL_TABLET | Freq: Four times a day (QID) | ORAL | Status: DC
  Administered 2015-05-27 (×4): 1 via ORAL
  Filled 2015-05-27 (×4): qty 1

## 2015-05-27 MED ORDER — LEVOTHYROXINE SODIUM 88 MCG PO TABS
88.0000 ug | ORAL_TABLET | Freq: Every day | ORAL | Status: DC
  Administered 2015-05-27 – 2015-05-29 (×3): 88 ug via ORAL
  Filled 2015-05-27 (×3): qty 1

## 2015-05-27 MED ORDER — ALPRAZOLAM 0.5 MG PO TABS
0.5000 mg | ORAL_TABLET | Freq: Three times a day (TID) | ORAL | Status: DC | PRN
  Administered 2015-05-27 – 2015-05-29 (×5): 0.5 mg via ORAL
  Filled 2015-05-27 (×5): qty 1

## 2015-05-27 MED ORDER — ENOXAPARIN SODIUM 30 MG/0.3ML ~~LOC~~ SOLN
30.0000 mg | SUBCUTANEOUS | Status: DC
  Administered 2015-05-28 – 2015-05-29 (×2): 30 mg via SUBCUTANEOUS
  Filled 2015-05-27 (×2): qty 0.3
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
## 2015-05-27 NOTE — Progress Notes (Signed)
Orthopedic Tech Progress Note Patient Details:  [name] 1935/05/29 [id number]  Ortho Devices Type of Ortho Device: Arm sling Ortho Device/Splint Interventions: Application   [place] Pink 05/27/2015, 10:53 AM
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
## 2015-05-28 DIAGNOSIS — S2220XA Unspecified fracture of sternum, initial encounter for closed fracture: Secondary | ICD-10-CM | POA: Diagnosis present

## 2015-05-28 DIAGNOSIS — S52201A Unspecified fracture of shaft of right ulna, initial encounter for closed fracture: Secondary | ICD-10-CM | POA: Diagnosis present

## 2015-05-28 DIAGNOSIS — D62 Acute posthemorrhagic anemia: Secondary | ICD-10-CM | POA: Diagnosis not present

## 2015-05-28 MED ORDER — MORPHINE SULFATE (PF) 2 MG/ML IV SOLN: 2.0000 mg | INTRAVENOUS | Status: DC | PRN

## 2015-05-28 MED ORDER — HYDROCODONE-ACETAMINOPHEN 10-325 MG PO TABS
0.5000 | ORAL_TABLET | ORAL | Status: DC | PRN
  Administered 2015-05-28 – 2015-05-29 (×5): 2 via ORAL
  Filled 2015-05-28 (×5): qty 2

## 2015-05-28 MED ORDER — ATORVASTATIN CALCIUM 10 MG PO TABS
10.0000 mg | ORAL_TABLET | Freq: Every evening | ORAL | Status: DC
  Administered 2015-05-28: 10 mg via ORAL
  Filled 2015-05-28: qty 1

## 2015-05-28 MED ORDER — BISACODYL 10 MG RE SUPP: 10.0000 mg | Freq: Every day | RECTAL | Status: DC | PRN

## 2015-05-28 MED ORDER — HYDROCORTISONE 2.5 % RE CREA
1.0000 "application " | TOPICAL_CREAM | Freq: Every day | RECTAL | Status: DC
  Administered 2015-05-28: 1 via RECTAL
  Filled 2015-05-28: qty 28.35

## 2015-05-28 MED ORDER — ESTRADIOL 0.1 MG/GM VA CREA
1.0000 | TOPICAL_CREAM | VAGINAL | Status: DC
  Administered 2015-05-28: 1 via VAGINAL
  Filled 2015-05-28: qty 42.5

## 2015-05-28 MED ORDER — POLYETHYLENE GLYCOL 3350 17 G PO PACK
17.0000 g | PACK | Freq: Every day | ORAL | Status: DC
  Administered 2015-05-28 – 2015-05-29 (×2): 17 g via ORAL
  Filled 2015-05-28 (×2): qty 1

## 2015-05-28 MED ORDER — DOCUSATE SODIUM 100 MG PO CAPS
100.0000 mg | ORAL_CAPSULE | Freq: Two times a day (BID) | ORAL | Status: DC
  Administered 2015-05-28 – 2015-05-29 (×3): 100 mg via ORAL
  Filled 2015-05-28 (×3): qty 1

## 2015-05-28 MED ORDER — HYPROMELLOSE (GONIOSCOPIC) 2.5 % OP SOLN
1.0000 [drp] | Freq: Three times a day (TID) | OPHTHALMIC | Status: DC | PRN
  Filled 2015-05-28: qty 15

## 2015-05-28 MED ORDER — TRAMADOL HCL 50 MG PO TABS
100.0000 mg | ORAL_TABLET | Freq: Four times a day (QID) | ORAL | Status: DC
  Administered 2015-05-28 – 2015-05-29 (×6): 100 mg via ORAL
  Filled 2015-05-28 (×6): qty 2

## 2015-05-28 MED ORDER — PANTOPRAZOLE SODIUM 40 MG PO TBEC
80.0000 mg | DELAYED_RELEASE_TABLET | Freq: Two times a day (BID) | ORAL | Status: DC
  Administered 2015-05-28 – 2015-05-29 (×3): 80 mg via ORAL
  Filled 2015-05-28 (×3): qty 2

## 2015-05-28 MED ORDER — HYDROCHLOROTHIAZIDE 25 MG PO TABS
25.0000 mg | ORAL_TABLET | Freq: Every day | ORAL | Status: DC
  Administered 2015-05-28 – 2015-05-29 (×2): 25 mg via ORAL
  Filled 2015-05-28 (×2): qty 1
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
## 2015-05-28 NOTE — Care Management Important Message (Signed)
Important Message  Patient Details  Name: [name] MRN: [id number] Date of Birth: 07-01-1935   Medicare Important Message Given:  Yes    Ellarose Brandi P Parthiv Mucci 05/28/2015, 4:18 PM

## 2015-05-29 MED ORDER — DOCUSATE SODIUM 100 MG PO CAPS
100.0000 mg | ORAL_CAPSULE | Freq: Two times a day (BID) | ORAL | Status: DC
Start: 2015-05-29 — End: 2015-10-04

## 2015-05-29 MED ORDER — HYDROCODONE-ACETAMINOPHEN 10-325 MG PO TABS: 0.5000 | ORAL_TABLET | ORAL | Status: DC | PRN

## 2015-05-29 MED ORDER — POLYETHYLENE GLYCOL 3350 17 G PO PACK: 17.0000 g | PACK | Freq: Every day | ORAL | Status: DC

## 2015-05-29 MED ORDER — TRAMADOL HCL 50 MG PO TABS: 100.0000 mg | ORAL_TABLET | Freq: Four times a day (QID) | ORAL | Status: DC
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
## 2015-05-29 NOTE — Discharge Instructions (Signed)
Ulnar Fracture An ulnar fracture is a break in the ulna bone, which is the forearm bone that is located on the same side as your little finger. Your forearm is the part of your arm that is between your elbow and your wrist. It is made up of two bones: the radius and ulna. The ulna forms the point of your elbow at its upper end. The lower end can be felt on the outside of your wrist. An ulnar fracture can happen near the wrist or elbow or in the middle of your forearm. Middle forearm fractures usually break both the radius and the ulna. CAUSES A heavy, direct blow to the forearm is the most common cause of an ulnar fracture. It takes a lot of force to break a bone in your forearm. This type of injury may be caused by:  An accident, such as a car or bike accident.  Falling with your arm outstretched. RISK FACTORS You may be at greater risk for an ulnar fracture if you:  Play contact sports.  Have a condition that causes your bones to be weak or thin (osteoporosis). SIGNS AND SYMPTOMS  An ulnar fracture causes pain immediately after the injury. You may need to support your forearm with your other hand. Other signs and symptoms include:  An abnormal bend or bump in your arm (deformity).  Swelling.  Bruising.  Numbness or weakness in your hand.  Inability to turn your hand from side to side (rotate). DIAGNOSIS Your health care provider may diagnose an ulnar fracture based on:  Your symptoms.  Your medical history, including any recent injury.  A physical exam. Your health care provider will look for any deformity and feel for tenderness over the break. Your health care provider will also check whether the bone is out of place.  An X-ray exam to confirm the diagnosis and learn more about the type of fracture. TREATMENT The goals of treatment are to get the bone in proper position for healing and to keep it from moving so it will heal over time. Your treatment will depend on many  factors, especially the type of fracture that you have.  If the fractured bone:  Is in the correct position (nondisplaced), you may only need to wear a cast or a splint.  Has a slightly displaced fracture, you may need to have the bones moved back into place manually (closed reduction) before the splint or cast is put on.  You may have a temporary splint before you have a plaster cast. The splint allows room for some swelling. After a few days, a cast can replace the splint.  You may have to wear the cast for about 6 weeks or as directed by your health care provider.  The cast may be changed after about 3 weeks or as directed by your health care provider.  After your cast is taken off, you may need physical therapy to regain full movement in your wrist or elbow.  You may need emergency surgery if you have:  A fractured bone that is out of position (displaced).  A fracture with multiple fragments (comminuted fracture).  A fracture that breaks the skin (open fracture). This type of fracture may require surgical wires, plates, or screws to hold the bone in place.  You may have X-rays every couple of weeks to check on your healing. HOME CARE INSTRUCTIONS  Keep the injured arm above the level of your heart while you are sitting or lying down. This helps  to reduce swelling and pain.  Apply ice to the injured area:  Put ice in a plastic bag.  Place a towel between your skin and the bag.  Leave the ice on for 20 minutes, 2-3 times per day.  Move your fingers often to avoid stiffness and to minimize swelling.  If you have a plaster or fiberglass cast:  Do not try to scratch the skin under the cast using sharp or pointed objects.  Check the skin around the cast every day. You may put lotion on any red or sore areas.  Keep your cast dry and clean.  If you have a plaster splint:  Wear the splint as directed.  Loosen the elastic around the splint if your fingers become numb and  tingle, or if they turn cold and blue.  Do not put pressure on any part of your cast until it is fully hardened. Rest your cast only on a pillow for the first 24 hours.  Protect your cast or splint while bathing or showering, as directed by your health care provider. Do not put your cast or splint into water.  Take medicines only as directed by your health care provider.  Return to activities, such as sports, as directed by your health care provider. Ask your health care provider what activities are safe for you.  Keep all follow-up visits as directed by your health care provider. This is important. SEEK MEDICAL CARE IF:  Your pain medicine is not helping.  Your cast gets damaged or it breaks.  Your cast becomes loose.  Your cast gets wet.  You have more severe pain or swelling than you did before the cast.  You have severe pain when stretching your fingers.  You continue to have pain or stiffness in your elbow or your wrist after your cast is taken off. SEEK IMMEDIATE MEDICAL CARE IF:  You cannot move your fingers.  You lose feeling in your fingers or your hand.  Your hand or your fingers turn cold and pale or blue.  You notice a bad smell coming from your cast.  You have drainage from underneath your cast.  You have new stains from blood or drainage seeping through your cast.   This information is not intended to replace advice given to you by your health care provider. Make sure you discuss any questions you have with your health care provider.   Document Released: 08/24/2005 Document Revised: 04/03/2014 Document Reviewed: 08/20/2013 Elsevier Interactive Patient Education 2016 [employer]. Rib Fracture A rib fracture is a break or crack in one of the bones of the ribs. The ribs are a group of long, curved bones that wrap around your chest and attach to your spine. They protect your lungs and other organs in the chest cavity. A broken or cracked rib is often painful,  but most do not cause other problems. Most rib fractures heal on their own over time. However, rib fractures can be more serious if multiple ribs are broken or if broken ribs move out of place and push against other structures. CAUSES   A direct blow to the chest. For example, this could happen during contact sports, a car accident, or a fall against a hard object.  Repetitive movements with high force, such as pitching a baseball or having severe coughing spells. SYMPTOMS   Pain when you breathe in or cough.  Pain when someone presses on the injured area. DIAGNOSIS  Your caregiver will perform a physical exam. Various imaging  tests may be ordered to confirm the diagnosis and to look for related injuries. These tests may include a chest X-ray, computed tomography (CT), magnetic resonance imaging (MRI), or a bone scan. TREATMENT  Rib fractures usually heal on their own in 1-3 months. The longer healing period is often associated with a continued cough or other aggravating activities. During the healing period, pain control is very important. Medication is usually given to control pain. Hospitalization or surgery may be needed for more severe injuries, such as those in which multiple ribs are broken or the ribs have moved out of place.  HOME CARE INSTRUCTIONS   Avoid strenuous activity and any activities or movements that cause pain. Be careful during activities and avoid bumping the injured rib.  Gradually increase activity as directed by your caregiver.  Only take over-the-counter or prescription medications as directed by your caregiver. Do not take other medications without asking your caregiver first.  Apply ice to the injured area for the first 1-2 days after you have been treated or as directed by your caregiver. Applying ice helps to reduce inflammation and pain.  Put ice in a plastic bag.  Place a towel between your skin and the bag.   Leave the ice on for 15-20 minutes at a  time, every 2 hours while you are awake.  Perform deep breathing as directed by your caregiver. This will help prevent pneumonia, which is a common complication of a broken rib. Your caregiver may instruct you to:  Take deep breaths several times a day.  Try to cough several times a day, holding a pillow against the injured area.  Use a device called an incentive spirometer to practice deep breathing several times a day.  Drink enough fluids to keep your urine clear or pale yellow. This will help you avoid constipation.   Do not wear a rib belt or binder. These restrict breathing, which can lead to pneumonia.  SEEK IMMEDIATE MEDICAL CARE IF:   You have a fever.   You have difficulty breathing or shortness of breath.   You develop a continual cough, or you cough up thick or bloody sputum.  You feel sick to your stomach (nausea), throw up (vomit), or have abdominal pain.   You have worsening pain not controlled with medications.  MAKE SURE YOU:  Understand these instructions.  Will watch your condition.  Will get help right away if you are not doing well or get worse.   This information is not intended to replace advice given to you by your health care provider. Make sure you discuss any questions you have with your health care provider.   Document Released: 03/13/2005 Document Revised: 11/13/2012 Document Reviewed: 05/15/2012 Elsevier Interactive Patient Education [employer].

## 2015-05-29 NOTE — Progress Notes (Signed)
Patient discharged to home with instructions, bedside commode was sent with patient.
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
## 2015-05-31 ENCOUNTER — Ambulatory Visit (HOSPITAL_COMMUNITY): Payer: Medicare Other

## 2015-05-31 ENCOUNTER — Other Ambulatory Visit (HOSPITAL_COMMUNITY): Payer: Medicare Other

## 2015-06-04 ENCOUNTER — Other Ambulatory Visit (HOSPITAL_COMMUNITY): Payer: Medicare Other

## 2015-06-04 ENCOUNTER — Ambulatory Visit (HOSPITAL_COMMUNITY): Payer: Medicare Other

## 2015-07-21 ENCOUNTER — Ambulatory Visit (HOSPITAL_COMMUNITY): Payer: Medicare Other | Admitting: Occupational Therapy

## 2015-08-12 ENCOUNTER — Ambulatory Visit (HOSPITAL_COMMUNITY)
Admission: RE | Admit: 2015-08-12 | Discharge: 2015-08-12 | Disposition: A | Discharge status: Home / Self Care | Payer: Medicare Other | Source: Ambulatory Visit | Attending: Pulmonary Disease | Admitting: Pulmonary Disease

## 2015-08-12 DIAGNOSIS — Z780 Asymptomatic menopausal state: Secondary | ICD-10-CM

## 2015-08-12 DIAGNOSIS — M810 Age-related osteoporosis without current pathological fracture: Secondary | ICD-10-CM | POA: Insufficient documentation

## 2015-09-30 ENCOUNTER — Ambulatory Visit (INDEPENDENT_AMBULATORY_CARE_PROVIDER_SITE_OTHER): Payer: Medicare Other | Admitting: Internal Medicine

## 2015-09-30 ENCOUNTER — Other Ambulatory Visit (INDEPENDENT_AMBULATORY_CARE_PROVIDER_SITE_OTHER): Payer: Self-pay | Admitting: Internal Medicine

## 2015-09-30 ENCOUNTER — Other Ambulatory Visit (INDEPENDENT_AMBULATORY_CARE_PROVIDER_SITE_OTHER): Payer: Self-pay | Admitting: *Deleted

## 2015-09-30 ENCOUNTER — Encounter (INDEPENDENT_AMBULATORY_CARE_PROVIDER_SITE_OTHER): Payer: Self-pay | Admitting: *Deleted

## 2015-09-30 ENCOUNTER — Encounter (INDEPENDENT_AMBULATORY_CARE_PROVIDER_SITE_OTHER): Payer: Self-pay | Admitting: Internal Medicine

## 2015-09-30 VITALS — BP 180/92 | HR 60 | Temp 98.0°F | Ht 60.0 in | Wt 156.0 lb

## 2015-09-30 DIAGNOSIS — K6289 Other specified diseases of anus and rectum: Secondary | ICD-10-CM | POA: Diagnosis not present

## 2015-09-30 MED ORDER — PEG 3350-KCL-NA BICARB-NACL 420 G PO SOLR: 4000.0000 mL | Freq: Once | ORAL | Status: DC

## 2015-09-30 NOTE — Patient Instructions (Signed)
Colonoscopy.  The risks and benefits such as perforation, bleeding, and infection were reviewed with the patient and is agreeable. 
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
## 2015-09-30 NOTE — Telephone Encounter (Signed)
Patient needs trilyte 

## 2015-10-01 ENCOUNTER — Emergency Department (HOSPITAL_COMMUNITY): Payer: Medicare Other

## 2015-10-01 ENCOUNTER — Encounter (HOSPITAL_COMMUNITY): Payer: Self-pay | Admitting: Emergency Medicine

## 2015-10-01 ENCOUNTER — Emergency Department (HOSPITAL_COMMUNITY)
Admission: EM | Admit: 2015-10-01 | Discharge: 2015-10-01 | Disposition: A | Discharge status: Home / Self Care | Payer: Medicare Other | Attending: Emergency Medicine | Admitting: Emergency Medicine

## 2015-10-01 DIAGNOSIS — J449 Chronic obstructive pulmonary disease, unspecified: Secondary | ICD-10-CM | POA: Insufficient documentation

## 2015-10-01 DIAGNOSIS — I2510 Atherosclerotic heart disease of native coronary artery without angina pectoris: Secondary | ICD-10-CM | POA: Diagnosis not present

## 2015-10-01 DIAGNOSIS — Z79899 Other long term (current) drug therapy: Secondary | ICD-10-CM | POA: Diagnosis not present

## 2015-10-01 DIAGNOSIS — R0602 Shortness of breath: Secondary | ICD-10-CM | POA: Diagnosis present

## 2015-10-01 DIAGNOSIS — R0600 Dyspnea, unspecified: Secondary | ICD-10-CM

## 2015-10-01 DIAGNOSIS — J45909 Unspecified asthma, uncomplicated: Secondary | ICD-10-CM | POA: Insufficient documentation

## 2015-10-01 DIAGNOSIS — E039 Hypothyroidism, unspecified: Secondary | ICD-10-CM | POA: Insufficient documentation

## 2015-10-01 DIAGNOSIS — E785 Hyperlipidemia, unspecified: Secondary | ICD-10-CM | POA: Insufficient documentation

## 2015-10-01 DIAGNOSIS — Z87891 Personal history of nicotine dependence: Secondary | ICD-10-CM | POA: Diagnosis not present

## 2015-10-01 DIAGNOSIS — F329 Major depressive disorder, single episode, unspecified: Secondary | ICD-10-CM | POA: Diagnosis not present

## 2015-10-01 DIAGNOSIS — I10 Essential (primary) hypertension: Secondary | ICD-10-CM | POA: Diagnosis not present

## 2015-10-01 LAB — BASIC METABOLIC PANEL
ANION GAP: 6 (ref 5–15)
BUN: 29 mg/dL — ABNORMAL HIGH (ref 6–20)
CHLORIDE: 106 mmol/L (ref 101–111)
CO2: 27 mmol/L (ref 22–32)
CREATININE: 1.68 mg/dL — AB (ref 0.44–1.00)
Calcium: 9.4 mg/dL (ref 8.9–10.3)
GFR calc non Af Amer: 28 mL/min — ABNORMAL LOW (ref >60)
GFR, EST AFRICAN AMERICAN: 33 mL/min — AB (ref >60)
Glucose, Bld: 115 mg/dL — ABNORMAL HIGH (ref 65–99)
Potassium: 4 mmol/L (ref 3.5–5.1)
Sodium: 139 mmol/L (ref 135–145)

## 2015-10-01 LAB — CBC
HCT: 37.6 % (ref 36.0–46.0)
Hemoglobin: 12.1 g/dL (ref 12.0–15.0)
MCH: 27.4 pg (ref 26.0–34.0)
MCHC: 32.2 g/dL (ref 30.0–36.0)
MCV: 85.1 fL (ref 78.0–100.0)
Platelets: 256 10*3/uL (ref 150–400)
RBC: 4.42 MIL/uL (ref 3.87–5.11)
RDW: 14.4 % (ref 11.5–15.5)
WBC: 7.7 10*3/uL (ref 4.0–10.5)

## 2015-10-01 LAB — D-DIMER, QUANTITATIVE: D-Dimer, Quant: 0.58 ug/mL-FEU — ABNORMAL HIGH (ref 0.00–0.50)

## 2015-10-01 LAB — TROPONIN I: Troponin I: <0.03 ng/mL (ref <0.03)

## 2015-10-01 MED ORDER — PREDNISONE 20 MG PO TABS: 20.0000 mg | ORAL_TABLET | Freq: Every day | ORAL | Status: DC

## 2015-10-01 MED ORDER — HYDROCODONE-ACETAMINOPHEN 5-325 MG PO TABS
2.0000 | ORAL_TABLET | Freq: Once | ORAL | Status: AC
  Administered 2015-10-01: 2 via ORAL
  Filled 2015-10-01: qty 2

## 2015-10-01 MED ORDER — ALBUTEROL SULFATE (2.5 MG/3ML) 0.083% IN NEBU
5.0000 mg | INHALATION_SOLUTION | Freq: Once | RESPIRATORY_TRACT | Status: AC
  Administered 2015-10-01: 5 mg via RESPIRATORY_TRACT
  Filled 2015-10-01: qty 6

## 2015-10-01 NOTE — ED Notes (Signed)
Pt reports SOB, chest tightness and weakness x 3-4 days. Pt states symptoms have become much worse today.

## 2015-10-01 NOTE — Discharge Instructions (Signed)
Use your inhaler. Prescription for prednisone. Appointment with cardiologist Dr. Dorris Carnes on Monday, July 10 at 11 AM next door to the [facility] emergency department.
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
## 2015-10-04 ENCOUNTER — Encounter: Payer: Self-pay | Admitting: Internal Medicine

## 2015-10-04 ENCOUNTER — Ambulatory Visit (INDEPENDENT_AMBULATORY_CARE_PROVIDER_SITE_OTHER): Payer: Medicare Other | Admitting: Internal Medicine

## 2015-10-04 VITALS — BP 202/90 | HR 79 | Ht 62.0 in | Wt 155.0 lb

## 2015-10-04 DIAGNOSIS — R0602 Shortness of breath: Secondary | ICD-10-CM | POA: Diagnosis not present

## 2015-10-04 MED ORDER — HYDRALAZINE HCL 25 MG PO TABS: 25.0000 mg | ORAL_TABLET | Freq: Three times a day (TID) | ORAL | Status: DC
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
## 2015-10-04 NOTE — Patient Instructions (Signed)
Your physician recommends that you schedule a follow-up appointment in: 1 Month with Dr. Harrington Challenger  Your physician has recommended you make the following change in your medication:   Start Hydralazine 25 mg Three Times Daily   Your physician has requested that you have an echocardiogram. Echocardiography is a painless test that uses sound waves to create images of your heart. It provides your doctor with information about the size and shape of your heart and how well your heart's chambers and valves are working. This procedure takes approximately one hour. There are no restrictions for this procedure.   If you need a refill on your cardiac medications before your next appointment, please call your pharmacy.  Thank you for choosing [facility]!

## 2015-10-07 ENCOUNTER — Ambulatory Visit (HOSPITAL_COMMUNITY)
Admission: RE | Admit: 2015-10-07 | Discharge: 2015-10-07 | Disposition: A | Discharge status: Home / Self Care | Payer: Medicare Other | Source: Ambulatory Visit | Attending: Internal Medicine | Admitting: Internal Medicine

## 2015-10-07 DIAGNOSIS — N189 Chronic kidney disease, unspecified: Secondary | ICD-10-CM | POA: Diagnosis not present

## 2015-10-07 DIAGNOSIS — Z87891 Personal history of nicotine dependence: Secondary | ICD-10-CM | POA: Diagnosis not present

## 2015-10-07 DIAGNOSIS — I340 Nonrheumatic mitral (valve) insufficiency: Secondary | ICD-10-CM | POA: Diagnosis not present

## 2015-10-07 DIAGNOSIS — I071 Rheumatic tricuspid insufficiency: Secondary | ICD-10-CM | POA: Diagnosis not present

## 2015-10-07 DIAGNOSIS — I5189 Other ill-defined heart diseases: Secondary | ICD-10-CM | POA: Diagnosis not present

## 2015-10-07 DIAGNOSIS — R0602 Shortness of breath: Secondary | ICD-10-CM | POA: Insufficient documentation

## 2015-10-07 DIAGNOSIS — I358 Other nonrheumatic aortic valve disorders: Secondary | ICD-10-CM | POA: Diagnosis not present

## 2015-10-07 DIAGNOSIS — I129 Hypertensive chronic kidney disease with stage 1 through stage 4 chronic kidney disease, or unspecified chronic kidney disease: Secondary | ICD-10-CM | POA: Diagnosis not present

## 2015-10-07 DIAGNOSIS — E785 Hyperlipidemia, unspecified: Secondary | ICD-10-CM | POA: Diagnosis not present

## 2015-10-07 LAB — ECHOCARDIOGRAM COMPLETE
AVLVOTPG: 9 mmHg
CHL CUP MV DEC (S): 176
CHL CUP TV REG PEAK VELOCITY: 277 cm/s
E decel time: 176 msec
E/e' ratio: 8.89
FS: 35 % (ref 28–44)
IV/PV OW: 0.89
LA diam end sys: 35 mm
LA diam index: 1.97 cm/m2
LA vol A4C: 49.8 ml
LA vol index: 25.2 mL/m2
LA vol: 44.8 mL
LASIZE: 35 mm
LDCA: 2.84 cm2
LV E/e'average: 8.89
LV TDI E'LATERAL: 10.2
LV sys vol index: 11 mL/m2
LV sys vol: 19 mL (ref 14–42)
LVDIAVOL: 52 mL (ref 46–106)
LVDIAVOLIN: 29 mL/m2
LVEEMED: 8.89
LVELAT: 10.2 cm/s
LVOT SV: 92 mL
LVOT VTI: 32.5 cm
LVOT diameter: 19 mm
LVOTPV: 150 cm/s
MV pk E vel: 90.7 m/s
MVPG: 3 mmHg
MVPKAVEL: 101 m/s
PW: 10.2 mm — AB (ref 0.6–1.1)
RV LATERAL S' VELOCITY: 17.6 cm/s
RV sys press: 34 mmHg
Simpson's disk: 63
Stroke v: 33 ml
TAPSE: 22.7 mm
TDI e' medial: 7.29
TR max vel: 277 cm/s

## 2015-10-07 NOTE — Progress Notes (Signed)
*  PRELIMINARY RESULTS* Echocardiogram 2D Echocardiogram has been performed.  [name] 10/07/2015, 10:39 AM

## 2015-10-14 ENCOUNTER — Encounter (HOSPITAL_COMMUNITY)
Admission: RE | Disposition: A | Discharge status: Home / Self Care | Payer: Self-pay | Source: Ambulatory Visit | Attending: Internal Medicine

## 2015-10-14 ENCOUNTER — Ambulatory Visit (HOSPITAL_COMMUNITY)
Admission: RE | Admit: 2015-10-14 | Discharge: 2015-10-14 | Disposition: A | Discharge status: Home / Self Care | Payer: Medicare Other | Source: Ambulatory Visit | Attending: Internal Medicine | Admitting: Internal Medicine

## 2015-10-14 ENCOUNTER — Encounter (HOSPITAL_COMMUNITY): Payer: Self-pay

## 2015-10-14 DIAGNOSIS — K6289 Other specified diseases of anus and rectum: Secondary | ICD-10-CM

## 2015-10-14 DIAGNOSIS — Z8719 Personal history of other diseases of the digestive system: Secondary | ICD-10-CM | POA: Diagnosis not present

## 2015-10-14 DIAGNOSIS — I10 Essential (primary) hypertension: Secondary | ICD-10-CM | POA: Diagnosis not present

## 2015-10-14 DIAGNOSIS — Z79899 Other long term (current) drug therapy: Secondary | ICD-10-CM | POA: Diagnosis not present

## 2015-10-14 DIAGNOSIS — K219 Gastro-esophageal reflux disease without esophagitis: Secondary | ICD-10-CM | POA: Insufficient documentation

## 2015-10-14 DIAGNOSIS — K644 Residual hemorrhoidal skin tags: Secondary | ICD-10-CM | POA: Diagnosis not present

## 2015-10-14 DIAGNOSIS — J449 Chronic obstructive pulmonary disease, unspecified: Secondary | ICD-10-CM | POA: Insufficient documentation

## 2015-10-14 DIAGNOSIS — E039 Hypothyroidism, unspecified: Secondary | ICD-10-CM | POA: Insufficient documentation

## 2015-10-14 DIAGNOSIS — K5900 Constipation, unspecified: Secondary | ICD-10-CM | POA: Diagnosis not present

## 2015-10-14 DIAGNOSIS — K648 Other hemorrhoids: Secondary | ICD-10-CM | POA: Insufficient documentation

## 2015-10-14 DIAGNOSIS — F419 Anxiety disorder, unspecified: Secondary | ICD-10-CM | POA: Insufficient documentation

## 2015-10-14 DIAGNOSIS — Z87891 Personal history of nicotine dependence: Secondary | ICD-10-CM | POA: Diagnosis not present

## 2015-10-14 DIAGNOSIS — E785 Hyperlipidemia, unspecified: Secondary | ICD-10-CM | POA: Diagnosis not present

## 2015-10-14 DIAGNOSIS — K5730 Diverticulosis of large intestine without perforation or abscess without bleeding: Secondary | ICD-10-CM | POA: Diagnosis not present

## 2015-10-14 DIAGNOSIS — I2510 Atherosclerotic heart disease of native coronary artery without angina pectoris: Secondary | ICD-10-CM | POA: Insufficient documentation

## 2015-10-14 HISTORY — PX: COLONOSCOPY: SHX5424

## 2015-10-14 LAB — HM COLONOSCOPY

## 2015-10-14 SURGERY — COLONOSCOPY
Anesthesia: Moderate Sedation

## 2015-10-14 MED ORDER — DOCUSATE SODIUM 100 MG PO CAPS: 200.0000 mg | ORAL_CAPSULE | Freq: Every day | ORAL | Status: DC

## 2015-10-14 MED ORDER — MIDAZOLAM HCL 5 MG/5ML IJ SOLN
INTRAMUSCULAR | Status: AC
  Filled 2015-10-14: qty 10

## 2015-10-14 MED ORDER — STERILE WATER FOR IRRIGATION IR SOLN
Status: DC | PRN
  Administered 2015-10-14: 2.5 mL

## 2015-10-14 MED ORDER — MIDAZOLAM HCL 5 MG/5ML IJ SOLN
INTRAMUSCULAR | Status: DC | PRN
  Administered 2015-10-14 (×4): 2 mg via INTRAVENOUS

## 2015-10-14 MED ORDER — SODIUM CHLORIDE 0.9 % IV SOLN
INTRAVENOUS | Status: DC
  Administered 2015-10-14: 20 mL/h via INTRAVENOUS

## 2015-10-14 MED ORDER — PSYLLIUM 28 % PO PACK
1.0000 | PACK | Freq: Every day | ORAL | Status: DC
Start: 2015-10-14 — End: 2016-06-20

## 2015-10-14 MED ORDER — HYDROCORTISONE ACETATE 25 MG RE SUPP: 25.0000 mg | Freq: Every day | RECTAL | Status: DC

## 2015-10-14 MED ORDER — PROMETHAZINE HCL 25 MG/ML IJ SOLN
INTRAMUSCULAR | Status: AC
  Filled 2015-10-14: qty 1

## 2015-10-14 MED ORDER — SODIUM CHLORIDE 0.9% FLUSH
INTRAVENOUS | Status: AC
  Filled 2015-10-14: qty 10

## 2015-10-14 MED ORDER — PROMETHAZINE HCL 25 MG/ML IJ SOLN
INTRAMUSCULAR | Status: DC | PRN
  Administered 2015-10-14: 12.5 mg via INTRAVENOUS

## 2015-10-14 MED ORDER — MEPERIDINE HCL 50 MG/ML IJ SOLN
INTRAMUSCULAR | Status: AC
  Filled 2015-10-14: qty 1

## 2015-10-14 MED ORDER — MEPERIDINE HCL 50 MG/ML IJ SOLN
INTRAMUSCULAR | Status: DC | PRN
  Administered 2015-10-14 (×2): 25 mg via INTRAVENOUS

## 2015-10-14 NOTE — Op Note (Signed)
[facility] Patient Name: [name] Procedure Date: 10/14/2015 9:59 AM MRN: [id number] Date of Birth: 1935-12-17 Attending MD: Hildred Laser , MD CSN: [id number] Age: 80 Admit Type: Outpatient Procedure:                Colonoscopy Indications:              Constipation, Rectal pain Providers:                Hildred Laser, MD, Lurline Del, RN, Isabella Stalling,                            Technician Referring MD:             Jasper Loser. Luan Pulling, MD Medicines:                Promethazine 12.5 mg IV, Meperidine 50 mg IV,                            Midazolam 8 mg IV Complications:            No immediate complications. Estimated Blood Loss:     Estimated blood loss: none. Procedure:                Pre-Anesthesia Assessment:                           - Prior to the procedure, a History and Physical                            was performed, and patient medications and                            allergies were reviewed. The patient's tolerance of                            previous anesthesia was also reviewed. The risks                            and benefits of the procedure and the sedation                            options and risks were discussed with the patient.                            All questions were answered, and informed consent                            was obtained. Prior Anticoagulants: The patient has                            taken no previous anticoagulant or antiplatelet                            agents. ASA Grade Assessment: III - A patient with  severe systemic disease. After reviewing the risks                            and benefits, the patient was deemed in                            satisfactory condition to undergo the procedure.                           After obtaining informed consent, the colonoscope                            was passed under direct vision. Throughout the                            procedure, the patient's  blood pressure, pulse, and                            oxygen saturations were monitored continuously. The                            [id number]) scope was introduced through                            the anus and advanced to the the cecum, identified                            by appendiceal orifice and ileocecal valve. The                            colonoscopy was performed without difficulty. The                            patient tolerated the procedure well. The quality                            of the bowel preparation was adequate. The                            ileocecal valve, appendiceal orifice, and rectum                            were photographed. Scope In: 3:37:30 PM Scope Out: 3:55:36 PM Scope Withdrawal Time: 0 hours 6 minutes 16 seconds  Total Procedure Duration: 0 hours 18 minutes 6 seconds  Findings:      A few small and large-mouthed diverticula were found in the sigmoid       colon.      External and internal hemorrhoids were found during retroflexion. The       hemorrhoids were medium-sized. Impression:               - Diverticulosis in the sigmoid colon.                           - External and internal  hemorrhoids.                           - No specimens collected. Moderate Sedation:      Moderate (conscious) sedation was administered by the endoscopy nurse       and supervised by the endoscopist. The following parameters were       monitored: oxygen saturation, heart rate, blood pressure, CO2       capnography and response to care. Total physician intraservice time was       27 minutes. Recommendation:           - Patient has a contact number available for                            emergencies. The signs and symptoms of potential                            delayed complications were discussed with the                            patient. Return to normal activities tomorrow.                            Written discharge instructions were provided  to the                            patient.                           - High fiber diet today.                           - Continue present medications.                           - Use original regular Metamucil one tablespoon PO                            daily.                           - Use hydrocortisone suppository 25 mg 1 per rectum                            once a day for 2 weeks.                           - No repeat colonoscopy. Procedure Code(s):        --- Professional ---                           [phone], Colonoscopy, flexible; diagnostic, including                            collection of specimen(s) by brushing or washing,  when performed (separate procedure)                           99152, Moderate sedation services provided by the                            same physician or other qualified health care                            professional performing the diagnostic or                            therapeutic service that the sedation supports,                            requiring the presence of an independent trained                            observer to assist in the monitoring of the                            patient's level of consciousness and physiological                            status; initial 15 minutes of intraservice time,                            patient age 39 years or older                           [phone], Moderate sedation services; each additional                            15 minutes intraservice time Diagnosis Code(s):        --- Professional ---                           K64.8, Other hemorrhoids                           K59.00, Constipation, unspecified                           K62.89, Other specified diseases of anus and rectum                           K57.30, Diverticulosis of large intestine without                            perforation or abscess without bleeding CPT copyright 2016 American Medical Association.  All rights reserved. The codes documented in this report are preliminary and upon coder review may  be revised to meet current compliance requirements. Hildred Laser, MD Hildred Laser, MD 10/14/2015 4:06:56 PM This report has been signed electronically. Number of Addenda: 0

## 2015-10-14 NOTE — H&P (Addendum)
[name] is an 80 y.o. female.   Chief Complaint: Patient is here for colonoscopy. HPI: This 80 year old Caucasian female with multiple medical problems who presents with constipation and rectal pain. She's had rectal pain for few years but she states it has gotten worse and at times unbearable. She denies melena or rectal bleeding anorexia or weight loss. She had colonoscopy about 5 years ago and she only had hyperplastic polyps. She also had a flexible sigmoidoscopy in October 2013 and was normal other than sigmoid colon diverticulosis. Patient offered a limited exam declines to have sigmoidoscopy. Family History is negative for CRC.  Past Medical History  Diagnosis Date  . Arteriosclerotic cardiovascular disease (ASCVD) 2006    Nonobstructive; < 50% lesions on cath 2002; negative stress nuclear study in 10/2004  . Hypertension   . COPD (chronic obstructive pulmonary disease) ([place])   . Colitis due to Clostridium difficile [place]  . Gastroesophageal reflux disease   . Hyperlipidemia   . Depression with anxiety   . Asthma   . Hypothyroidism   . S/P endoscopy 2009    linear esophageal erosions  . S/P colonoscopy 2007    few diverticula, otherwise nl  . S/P endoscopy 05/10/10    Question island of salmon-colored epithelium in distal esophagus ; no Barrett's.   . S/P colonoscopy 12/13/10    rectal, cecal polyp, left-sided diverticulosis, hyperplastic. ?anal fissure? treated empirically  . Chronic pelvic pain in female   . Tobacco abuse, in remission     55-pack-year consumption; discontinued 08/2010  . Shingles     Past Surgical History  Procedure Laterality Date  . Abdominal hysterectomy    . Cholecystectomy  1962  . Breast lumpectomy    . Cataract extraction    . Nissen fundoplication    . Colonoscopy  12/13/2010    Left-sided diverticulosis.  Cecal polyp, status post hot snare polypectomy/ Diminutive rectal polyp, status post cold biopsy removal tender/painful anal  canal, ? occult anal fissure- Not visualized  . Esophagogastroduodenoscopy  05/10/10    benign mucosa with mild chronic inflammation.  . Flexible sigmoidoscopy  12/29/2011    Procedure: FLEXIBLE SIGMOIDOSCOPY;  Surgeon: Rogene Houston, MD;  Location[facility];  Service: Endoscopy;  Laterality: N/A;  200-Ann notified pt to be here @ 1:00  . Yag laser application Left [phone]    Procedure: YAG LASER APPLICATION;  Surgeon: Williams Che, MD;  Location: [facility];  Service: Ophthalmology;  Laterality: Left;    Family History  Problem Relation Age of Onset  . Colon cancer Neg Hx   . Diabetes Mother   . Heart disease Father   . Hypertension Brother     also Sister x2  . Heart disease Brother     Also mother, Father, brother and 2 sons  . Aneurysm Son   . Diabetes Son   . Alzheimer's disease Mother    Social History:  reports that she quit smoking about 5 years ago. She has never used smokeless tobacco. She reports that she does not drink alcohol or use illicit drugs.  Allergies:  Allergies  Allergen Reactions  . Paroxetine Itching  . Sulfa Antibiotics Other (See Comments)    Childhood reaction.  . Venlafaxine Itching  . Nalbuphine Rash    Medications Prior to Admission  Medication Sig Dispense Refill  . alendronate (FOSAMAX) 70 MG tablet Take 70 mg by mouth once a week. Take with a full glass of water on an  empty stomach.    . ALPRAZolam (XANAX) 0.5 MG tablet Take 0.5 mg by mouth 4 (four) times daily as needed for anxiety. For anxiety.    Marland Kitchen atorvastatin (LIPITOR) 20 MG tablet Take 10 mg by mouth every evening.     Marland Kitchen ESTRACE VAGINAL 0.1 MG/GM vaginal cream Apply 1 application topically 3 (three) times a week.  4  . fish oil-omega-3 fatty acids 1000 MG capsule Take 2 g by mouth at bedtime.     . hydrALAZINE (APRESOLINE) 25 MG tablet Take 1 tablet (25 mg total) by mouth 3 (three) times daily. 270 tablet 3  . hydrochlorothiazide (HYDRODIURIL) 25 MG tablet Take 1 tablet by mouth  daily.  11  . HYDROcodone-acetaminophen (NORCO) 10-325 MG tablet Take 0.5-2 tablets by mouth every 4 (four) hours as needed (1/2 tablet for mild pain, 1 tablet for moderate pain, 2 tablets for severe pain). 80 tablet 0  . hydroxypropyl methylcellulose (ISOPTO TEARS) 2.5 % ophthalmic solution Place 1 drop into both eyes 3 (three) times daily as needed. For dry eyes.    Marland Kitchen levothyroxine (SYNTHROID, LEVOTHROID) 88 MCG tablet Take 88 mcg by mouth daily.  11  . Multiple Vitamins-Minerals (EYE VITAMINS PO) Take 1 tablet by mouth 2 (two) times daily.    Marland Kitchen omeprazole (PRILOSEC) 40 MG capsule Take 40 mg by mouth 2 (two) times daily.     . polyethylene glycol-electrolytes (TRILYTE) 420 g solution Take 4,000 mLs by mouth once. 4000 mL 0  . PROCTOZONE-HC 2.5 % rectal cream Place 1 application rectally at bedtime.     . Verapamil HCl CR 300 MG CP24 Take 1 capsule by mouth at bedtime.  11  . Multiple Vitamins-Minerals (CENTRUM ADULTS PO) Take 1 tablet by mouth daily.    . predniSONE (DELTASONE) 20 MG tablet Take 1 tablet (20 mg total) by mouth daily with breakfast. 7 tablet 0    No results found for this or any previous visit (from the past 48 hour(s)). No results found.  ROS  Blood pressure 149/84, pulse 65, temperature 99.4 F (37.4 C), resp. rate 18, height 5\' 2"  (1.575 m), weight 155 lb (70.308 kg), SpO2 98 %. Physical Exam  Constitutional: She appears well-developed and well-nourished.  HENT:  Mouth/Throat: Oropharynx is clear and moist.  Eyes: Conjunctivae are normal. No scleral icterus.  Neck: No thyromegaly present.  Cardiovascular: Normal rate and regular rhythm.   Murmur (faint systolic ejection murmur left sternal border.) heard. Respiratory: Effort normal and breath sounds normal.  GI:  Abdomen is symmetrical and soft with mild tenderness at LLQ. No organomegaly or masses.  Musculoskeletal: She exhibits no edema.  Lymphadenopathy:    She has no cervical adenopathy.  Neurological: She  is alert.  Skin: Skin is warm and dry.     Assessment/Plan History patient and rectal pain. Diagnostic colonoscopy.  Hildred Laser, MD 10/14/2015, 3:17 PM

## 2015-10-14 NOTE — Discharge Instructions (Signed)
Discontinue Proctozone. Resume other medications as before. Metamucil 4-6 g by mouth daily at bedtime. Colace 200 mg by mouth daily at bedtime Anusol HC suppository 1 per rectum daily at bedtime for 2 weeks.    Colonoscopy, Care After These instructions give you information on caring for yourself after your procedure. Your doctor may also give you more specific instructions. Call your doctor if you have any problems or questions after your procedure. HOME CARE  Do not drive for 24 hours.  Do not sign important papers or use machinery for 24 hours.  You may shower.  You may go back to your usual activities, but go slower for the first 24 hours.  Take rest breaks often during the first 24 hours.  Walk around or use warm packs on your belly (abdomen) if you have belly cramping or gas.  Drink enough fluids to keep your pee (urine) clear or pale yellow.  Resume your normal diet. Avoid heavy or fried foods.  Avoid drinking alcohol for 24 hours or as told by your doctor.  Only take medicines as told by your doctor. If a tissue sample (biopsy) was taken during the procedure:   Do not take aspirin or blood thinners for 7 days, or as told by your doctor.  Do not drink alcohol for 7 days, or as told by your doctor.  Eat soft foods for the first 24 hours. GET HELP IF: You still have a small amount of blood in your poop (stool) 2-3 days after the procedure. GET HELP RIGHT AWAY IF:  You have more than a small amount of blood in your poop.  You see clumps of tissue (blood clots) in your poop.  Your belly is puffy (swollen).  You feel sick to your stomach (nauseous) or throw up (vomit).  You have a fever.  You have belly pain that gets worse and medicine does not help. MAKE SURE YOU:  Understand these instructions.  Will watch your condition.  Will get help right away if you are not doing well or get worse.   This information is not intended to replace advice given to you  by your health care provider. Make sure you discuss any questions you have with your health care provider.   Document Released: 04/15/2010 Document Revised: 03/18/2013 Document Reviewed: 11/18/2012 Elsevier Interactive Patient Education 2016 [employer].   Hemorrhoids Hemorrhoids are swollen veins around the rectum or anus. There are two types of hemorrhoids:   Internal hemorrhoids. These occur in the veins just inside the rectum. They may poke through to the outside and become irritated and painful.  External hemorrhoids. These occur in the veins outside the anus and can be felt as a painful swelling or hard lump near the anus. CAUSES  Pregnancy.   Obesity.   Constipation or diarrhea.   Straining to have a bowel movement.   Sitting for long periods on the toilet.  Heavy lifting or other activity that caused you to strain.  Anal intercourse. SYMPTOMS   Pain.   Anal itching or irritation.   Rectal bleeding.   Fecal leakage.   Anal swelling.   One or more lumps around the anus.  DIAGNOSIS  Your caregiver may be able to diagnose hemorrhoids by visual examination. Other examinations or tests that may be performed include:   Examination of the rectal area with a gloved hand (digital rectal exam).   Examination of anal canal using a small tube (scope).   A blood test if you have  lost a significant amount of blood.  A test to look inside the colon (sigmoidoscopy or colonoscopy). TREATMENT Most hemorrhoids can be treated at home. However, if symptoms do not seem to be getting better or if you have a lot of rectal bleeding, your caregiver may perform a procedure to help make the hemorrhoids get smaller or remove them completely. Possible treatments include:   Placing a rubber band at the base of the hemorrhoid to cut off the circulation (rubber band ligation).   Injecting a chemical to shrink the hemorrhoid (sclerotherapy).   Using a tool to burn the  hemorrhoid (infrared light therapy).   Surgically removing the hemorrhoid (hemorrhoidectomy).   Stapling the hemorrhoid to block blood flow to the tissue (hemorrhoid stapling).  HOME CARE INSTRUCTIONS   Eat foods with fiber, such as whole grains, beans, nuts, fruits, and vegetables. Ask your doctor about taking products with added fiber in them (fibersupplements).  Increase fluid intake. Drink enough water and fluids to keep your urine clear or pale yellow.   Exercise regularly.   Go to the bathroom when you have the urge to have a bowel movement. Do not wait.   Avoid straining to have bowel movements.   Keep the anal area dry and clean. Use wet toilet paper or moist towelettes after a bowel movement.   Medicated creams and suppositories may be used or applied as directed.   Only take over-the-counter or prescription medicines as directed by your caregiver.   Take warm sitz baths for 15-20 minutes, 3-4 times a day to ease pain and discomfort.   Place ice packs on the hemorrhoids if they are tender and swollen. Using ice packs between sitz baths may be helpful.   Put ice in a plastic bag.   Place a towel between your skin and the bag.   Leave the ice on for 15-20 minutes, 3-4 times a day.   Do not use a donut-shaped pillow or sit on the toilet for long periods. This increases blood pooling and pain.  SEEK MEDICAL CARE IF:  You have increasing pain and swelling that is not controlled by treatment or medicine.  You have uncontrolled bleeding.  You have difficulty or you are unable to have a bowel movement.  You have pain or inflammation outside the area of the hemorrhoids. MAKE SURE YOU:  Understand these instructions.  Will watch your condition.  Will get help right away if you are not doing well or get worse.   This information is not intended to replace advice given to you by your health care provider. Make sure you discuss any questions you have  with your health care provider.   Document Released: 03/10/2000 Document Revised: 02/28/2012 Document Reviewed: 01/16/2012 Elsevier Interactive Patient Education 2016 [employer].   Diverticulosis Diverticulosis is the condition that develops when small pouches (diverticula) form in the wall of your colon. Your colon, or large intestine, is where water is absorbed and stool is formed. The pouches form when the inside layer of your colon pushes through weak spots in the outer layers of your colon. CAUSES  No one knows exactly what causes diverticulosis. RISK FACTORS  Being older than 9. Your risk for this condition increases with age. Diverticulosis is rare in people younger than 40 years. By age 54, almost everyone has it.  Eating a low-fiber diet.  Being frequently constipated.  Being overweight.  Not getting enough exercise.  Smoking.  Taking over-the-counter pain medicines, like aspirin and ibuprofen. SYMPTOMS  Most people with diverticulosis do not have symptoms. DIAGNOSIS  Because diverticulosis often has no symptoms, health care providers often discover the condition during an exam for other colon problems. In many cases, a health care provider will diagnose diverticulosis while using a flexible scope to examine the colon (colonoscopy). TREATMENT  If you have never developed an infection related to diverticulosis, you may not need treatment. If you have had an infection before, treatment may include:  Eating more fruits, vegetables, and grains.  Taking a fiber supplement.  Taking a live bacteria supplement (probiotic).  Taking medicine to relax your colon. HOME CARE INSTRUCTIONS   Drink at least 6-8 glasses of water each day to prevent constipation.  Try not to strain when you have a bowel movement.  Keep all follow-up appointments. If you have had an infection before:  Increase the fiber in your diet as directed by your health care provider or  dietitian.  Take a dietary fiber supplement if your health care provider approves.  Only take medicines as directed by your health care provider. SEEK MEDICAL CARE IF:   You have abdominal pain.  You have bloating.  You have cramps.  You have not gone to the bathroom in 3 days. SEEK IMMEDIATE MEDICAL CARE IF:   Your pain gets worse.  Yourbloating becomes very bad.  You have a fever or chills, and your symptoms suddenly get worse.  You begin vomiting.  You have bowel movements that are bloody or black. MAKE SURE YOU:  Understand these instructions.  Will watch your condition.  Will get help right away if you are not doing well or get worse.   This information is not intended to replace advice given to you by your health care provider. Make sure you discuss any questions you have with your health care provider.   Document Released: 12/09/2003 Document Revised: 03/18/2013 Document Reviewed: 02/05/2013 Elsevier Interactive Patient Education [employer].

## 2015-10-15 ENCOUNTER — Encounter (HOSPITAL_COMMUNITY): Payer: Self-pay | Admitting: Internal Medicine

## 2015-10-19 ENCOUNTER — Emergency Department (HOSPITAL_COMMUNITY): Payer: Medicare Other

## 2015-10-19 ENCOUNTER — Telehealth: Payer: Self-pay | Admitting: *Deleted

## 2015-10-19 ENCOUNTER — Encounter (HOSPITAL_COMMUNITY): Payer: Self-pay | Admitting: Emergency Medicine

## 2015-10-19 ENCOUNTER — Emergency Department (HOSPITAL_COMMUNITY)
Admission: EM | Admit: 2015-10-19 | Discharge: 2015-10-19 | Disposition: A | Discharge status: Home / Self Care | Payer: Medicare Other | Attending: Emergency Medicine | Admitting: Emergency Medicine

## 2015-10-19 DIAGNOSIS — E785 Hyperlipidemia, unspecified: Secondary | ICD-10-CM | POA: Insufficient documentation

## 2015-10-19 DIAGNOSIS — I2510 Atherosclerotic heart disease of native coronary artery without angina pectoris: Secondary | ICD-10-CM | POA: Diagnosis not present

## 2015-10-19 DIAGNOSIS — N183 Chronic kidney disease, stage 3 (moderate): Secondary | ICD-10-CM | POA: Insufficient documentation

## 2015-10-19 DIAGNOSIS — J45909 Unspecified asthma, uncomplicated: Secondary | ICD-10-CM | POA: Insufficient documentation

## 2015-10-19 DIAGNOSIS — R0789 Other chest pain: Secondary | ICD-10-CM | POA: Insufficient documentation

## 2015-10-19 DIAGNOSIS — R0600 Dyspnea, unspecified: Secondary | ICD-10-CM

## 2015-10-19 DIAGNOSIS — J449 Chronic obstructive pulmonary disease, unspecified: Secondary | ICD-10-CM | POA: Diagnosis not present

## 2015-10-19 DIAGNOSIS — E039 Hypothyroidism, unspecified: Secondary | ICD-10-CM | POA: Insufficient documentation

## 2015-10-19 DIAGNOSIS — F329 Major depressive disorder, single episode, unspecified: Secondary | ICD-10-CM | POA: Diagnosis not present

## 2015-10-19 DIAGNOSIS — I129 Hypertensive chronic kidney disease with stage 1 through stage 4 chronic kidney disease, or unspecified chronic kidney disease: Secondary | ICD-10-CM | POA: Insufficient documentation

## 2015-10-19 DIAGNOSIS — Z79899 Other long term (current) drug therapy: Secondary | ICD-10-CM | POA: Diagnosis not present

## 2015-10-19 DIAGNOSIS — R0602 Shortness of breath: Secondary | ICD-10-CM | POA: Diagnosis present

## 2015-10-19 DIAGNOSIS — Z87891 Personal history of nicotine dependence: Secondary | ICD-10-CM | POA: Diagnosis not present

## 2015-10-19 LAB — I-STAT CHEM 8, ED
BUN: 25 mg/dL — ABNORMAL HIGH (ref 6–20)
CALCIUM ION: 1.2 mmol/L (ref 1.12–1.23)
CREATININE: 1.6 mg/dL — AB (ref 0.44–1.00)
Chloride: 107 mmol/L (ref 101–111)
GLUCOSE: 91 mg/dL (ref 65–99)
HCT: 38 % (ref 36.0–46.0)
HEMOGLOBIN: 12.9 g/dL (ref 12.0–15.0)
POTASSIUM: 3.7 mmol/L (ref 3.5–5.1)
Sodium: 143 mmol/L (ref 135–145)
TCO2: 25 mmol/L (ref 0–100)

## 2015-10-19 LAB — I-STAT TROPONIN, ED: TROPONIN I, POC: 0 ng/mL (ref 0.00–0.08)

## 2015-10-19 MED ORDER — IPRATROPIUM-ALBUTEROL 0.5-2.5 (3) MG/3ML IN SOLN
3.0000 mL | Freq: Once | RESPIRATORY_TRACT | Status: AC
  Administered 2015-10-19: 3 mL via RESPIRATORY_TRACT
  Filled 2015-10-19: qty 3

## 2015-10-19 MED ORDER — LIDOCAINE 5 % EX PTCH: 1.0000 | MEDICATED_PATCH | CUTANEOUS | 0 refills | Status: DC

## 2015-10-19 MED ORDER — ALBUTEROL SULFATE HFA 108 (90 BASE) MCG/ACT IN AERS: 2.0000 | INHALATION_SPRAY | RESPIRATORY_TRACT | 0 refills | Status: DC | PRN

## 2015-10-19 MED ORDER — HYDROCODONE-ACETAMINOPHEN 5-325 MG PO TABS
2.0000 | ORAL_TABLET | Freq: Once | ORAL | Status: AC
  Administered 2015-10-19: 2 via ORAL
  Filled 2015-10-19: qty 2

## 2015-10-19 NOTE — Telephone Encounter (Signed)
Patient states that she is SOB and has been since March. Pt states that her SOB has been getting increasing worse by each day. Patient states that " I can't live like this". Pt reports BP of 162/92, 158/87, 160/88, 152/79. HR of 88, 82 92. Patient encouraged to be seen in ED.
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
## 2015-10-19 NOTE — Discharge Instructions (Signed)
You have been seen in the Emergency Department (ED) today for chest pain.  As we have discussed today?s test results are normal, and we believe your pain is due to pain/strain and/or inflammation of the muscles and/or cartilage of your chest wall.  We recommend you take ibuprofen 600 mg three times a day with meals for the next 5 days (unless you have been told previously not to take ibuprofen or NSAIDs in general).  You may also take Tylenol according to the label instructions.  Read through the included information for additional treatment recommendations and precautions. We also provided a lidocaine patch. If too expensive you can obtain a slightly weaker patch over-the-counter.   Continue to take your regular medications.   Return to the Emergency Department (ED) if you experience any further chest pain/pressure/tightness, difficulty breathing, or sudden sweating, or other symptoms that concern you.

## 2015-10-19 NOTE — ED Notes (Signed)
RT called for breathing treatment.

## 2015-10-28 DIAGNOSIS — Z961 Presence of intraocular lens: Secondary | ICD-10-CM | POA: Diagnosis not present

## 2015-10-28 DIAGNOSIS — H353133 Nonexudative age-related macular degeneration, bilateral, advanced atrophic without subfoveal involvement: Secondary | ICD-10-CM | POA: Diagnosis not present

## 2015-11-19 ENCOUNTER — Ambulatory Visit: Payer: Medicare Other | Admitting: Internal Medicine
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
## 2015-11-26 ENCOUNTER — Ambulatory Visit (INDEPENDENT_AMBULATORY_CARE_PROVIDER_SITE_OTHER): Payer: Medicare Other | Admitting: Internal Medicine

## 2015-11-26 ENCOUNTER — Encounter: Payer: Self-pay | Admitting: Internal Medicine

## 2015-11-26 VITALS — BP 126/70 | HR 65 | Ht 60.0 in | Wt 156.0 lb

## 2015-11-26 DIAGNOSIS — E785 Hyperlipidemia, unspecified: Secondary | ICD-10-CM

## 2015-11-26 DIAGNOSIS — I10 Essential (primary) hypertension: Secondary | ICD-10-CM

## 2015-11-26 DIAGNOSIS — R0789 Other chest pain: Secondary | ICD-10-CM | POA: Diagnosis not present

## 2015-11-26 NOTE — Patient Instructions (Signed)
Your physician wants you to follow-up in: April or May 2018  With Dr Theressa Stamps will receive a reminder letter in the mail two months in advance. If you don't receive a letter, please call our office to schedule the follow-up appointment.   Go back on atorvastatin for your cholesterol    Thank you for choosing [facility] !

## 2015-12-08 ENCOUNTER — Encounter: Payer: Self-pay | Admitting: Pulmonary Disease

## 2015-12-08 DIAGNOSIS — N390 Urinary tract infection, site not specified: Secondary | ICD-10-CM | POA: Diagnosis not present

## 2015-12-20 DIAGNOSIS — S52231D Displaced oblique fracture of shaft of right ulna, subsequent encounter for closed fracture with routine healing: Secondary | ICD-10-CM | POA: Diagnosis not present

## 2015-12-30 DIAGNOSIS — J449 Chronic obstructive pulmonary disease, unspecified: Secondary | ICD-10-CM | POA: Diagnosis not present

## 2015-12-30 DIAGNOSIS — Z23 Encounter for immunization: Secondary | ICD-10-CM | POA: Diagnosis not present

## 2015-12-30 DIAGNOSIS — N183 Chronic kidney disease, stage 3 (moderate): Secondary | ICD-10-CM | POA: Diagnosis not present

## 2015-12-30 DIAGNOSIS — I129 Hypertensive chronic kidney disease with stage 1 through stage 4 chronic kidney disease, or unspecified chronic kidney disease: Secondary | ICD-10-CM | POA: Diagnosis not present

## 2015-12-30 DIAGNOSIS — I10 Essential (primary) hypertension: Secondary | ICD-10-CM | POA: Diagnosis not present

## 2016-01-19 ENCOUNTER — Emergency Department (HOSPITAL_COMMUNITY): Payer: Medicare Other

## 2016-01-19 ENCOUNTER — Encounter (HOSPITAL_COMMUNITY): Payer: Self-pay | Admitting: *Deleted

## 2016-01-19 ENCOUNTER — Emergency Department (HOSPITAL_COMMUNITY)
Admission: EM | Admit: 2016-01-19 | Discharge: 2016-01-19 | Disposition: A | Discharge status: Home / Self Care | Payer: Medicare Other | Attending: Emergency Medicine | Admitting: Emergency Medicine

## 2016-01-19 DIAGNOSIS — I2510 Atherosclerotic heart disease of native coronary artery without angina pectoris: Secondary | ICD-10-CM | POA: Insufficient documentation

## 2016-01-19 DIAGNOSIS — Z87891 Personal history of nicotine dependence: Secondary | ICD-10-CM | POA: Diagnosis not present

## 2016-01-19 DIAGNOSIS — Z79899 Other long term (current) drug therapy: Secondary | ICD-10-CM | POA: Insufficient documentation

## 2016-01-19 DIAGNOSIS — J449 Chronic obstructive pulmonary disease, unspecified: Secondary | ICD-10-CM | POA: Diagnosis not present

## 2016-01-19 DIAGNOSIS — I129 Hypertensive chronic kidney disease with stage 1 through stage 4 chronic kidney disease, or unspecified chronic kidney disease: Secondary | ICD-10-CM | POA: Diagnosis not present

## 2016-01-19 DIAGNOSIS — J45909 Unspecified asthma, uncomplicated: Secondary | ICD-10-CM | POA: Diagnosis not present

## 2016-01-19 DIAGNOSIS — G894 Chronic pain syndrome: Secondary | ICD-10-CM | POA: Insufficient documentation

## 2016-01-19 DIAGNOSIS — N3000 Acute cystitis without hematuria: Secondary | ICD-10-CM | POA: Diagnosis not present

## 2016-01-19 DIAGNOSIS — Z8781 Personal history of (healed) traumatic fracture: Secondary | ICD-10-CM | POA: Diagnosis not present

## 2016-01-19 DIAGNOSIS — N183 Chronic kidney disease, stage 3 (moderate): Secondary | ICD-10-CM | POA: Insufficient documentation

## 2016-01-19 DIAGNOSIS — R079 Chest pain, unspecified: Secondary | ICD-10-CM | POA: Diagnosis not present

## 2016-01-19 DIAGNOSIS — R0789 Other chest pain: Secondary | ICD-10-CM

## 2016-01-19 DIAGNOSIS — S299XXA Unspecified injury of thorax, initial encounter: Secondary | ICD-10-CM | POA: Diagnosis not present

## 2016-01-19 DIAGNOSIS — E039 Hypothyroidism, unspecified: Secondary | ICD-10-CM | POA: Insufficient documentation

## 2016-01-19 DIAGNOSIS — R0781 Pleurodynia: Secondary | ICD-10-CM | POA: Diagnosis present

## 2016-01-19 LAB — URINALYSIS, ROUTINE W REFLEX MICROSCOPIC
Bilirubin Urine: NEGATIVE
GLUCOSE, UA: NEGATIVE mg/dL
KETONES UR: NEGATIVE mg/dL
Nitrite: POSITIVE — AB
Specific Gravity, Urine: 1.015 (ref 1.005–1.030)
pH: 6 (ref 5.0–8.0)

## 2016-01-19 LAB — URINE MICROSCOPIC-ADD ON

## 2016-01-19 MED ORDER — CEPHALEXIN 500 MG PO CAPS: 500.0000 mg | ORAL_CAPSULE | Freq: Two times a day (BID) | ORAL | 0 refills | Status: DC
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
## 2016-01-19 NOTE — ED Notes (Signed)
MD at bedside. 
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
## 2016-01-19 NOTE — ED Notes (Signed)
Pt ambulatory to the bathroom 

## 2016-01-19 NOTE — Discharge Instructions (Signed)
Please continue to follow-up with your primary care doctor regarding your pain issues.  You look like you may have persistent UTI and you are given a course of keflex for this.  Return for worsening symptoms, including fever, intractable vomiting, confusion, abdominal pain or any other symptoms concerning to you.

## 2016-01-22 LAB — URINE CULTURE

## 2016-01-23 ENCOUNTER — Telehealth (HOSPITAL_BASED_OUTPATIENT_CLINIC_OR_DEPARTMENT_OTHER): Payer: Self-pay | Admitting: Emergency Medicine

## 2016-01-23 NOTE — Telephone Encounter (Signed)
Post ED Visit - Positive Culture Follow-up  Culture report reviewed by antimicrobial stewardship pharmacist:  []  Elenor Quinones, Pharm.D. []  Heide Guile, Pharm.D., BCPS []  Parks Neptune, Pharm.D. []  Alycia Rossetti, Pharm.D., BCPS []  [place], [place].D., BCPS, AAHIVP []  Legrand Como, Pharm.D., BCPS, AAHIVP []  Milus Glazier, Pharm.D. []  Stephens November, Pharm.D. Salome Arnt PharmD  Positive urine culture Treated with cephalexin, organism sensitive to the same and no further patient follow-up is required at this time.  Hazle Nordmann 01/23/2016, 10:28 AM

## 2016-02-11 ENCOUNTER — Encounter: Payer: Self-pay | Admitting: Pulmonary Disease

## 2016-02-11 DIAGNOSIS — N183 Chronic kidney disease, stage 3 (moderate): Secondary | ICD-10-CM | POA: Diagnosis not present

## 2016-02-11 DIAGNOSIS — M1990 Unspecified osteoarthritis, unspecified site: Secondary | ICD-10-CM | POA: Diagnosis not present

## 2016-02-11 DIAGNOSIS — J449 Chronic obstructive pulmonary disease, unspecified: Secondary | ICD-10-CM | POA: Diagnosis not present

## 2016-02-11 DIAGNOSIS — I10 Essential (primary) hypertension: Secondary | ICD-10-CM | POA: Diagnosis not present

## 2016-02-21 DIAGNOSIS — L0100 Impetigo, unspecified: Secondary | ICD-10-CM | POA: Diagnosis not present

## 2016-02-21 DIAGNOSIS — Z85828 Personal history of other malignant neoplasm of skin: Secondary | ICD-10-CM | POA: Diagnosis not present

## 2016-02-29 DIAGNOSIS — J449 Chronic obstructive pulmonary disease, unspecified: Secondary | ICD-10-CM | POA: Diagnosis not present

## 2016-02-29 DIAGNOSIS — N183 Chronic kidney disease, stage 3 (moderate): Secondary | ICD-10-CM | POA: Diagnosis not present

## 2016-02-29 DIAGNOSIS — R1030 Lower abdominal pain, unspecified: Secondary | ICD-10-CM | POA: Diagnosis not present

## 2016-02-29 DIAGNOSIS — I10 Essential (primary) hypertension: Secondary | ICD-10-CM | POA: Diagnosis not present

## 2016-03-09 ENCOUNTER — Other Ambulatory Visit (HOSPITAL_COMMUNITY): Payer: Self-pay | Admitting: Pulmonary Disease

## 2016-03-09 DIAGNOSIS — R1030 Lower abdominal pain, unspecified: Secondary | ICD-10-CM

## 2016-03-09 DIAGNOSIS — L0100 Impetigo, unspecified: Secondary | ICD-10-CM | POA: Diagnosis not present

## 2016-03-17 ENCOUNTER — Other Ambulatory Visit (HOSPITAL_COMMUNITY): Payer: Self-pay | Admitting: Pulmonary Disease

## 2016-03-17 ENCOUNTER — Ambulatory Visit (HOSPITAL_COMMUNITY)
Admission: RE | Admit: 2016-03-17 | Discharge: 2016-03-17 | Disposition: A | Discharge status: Home / Self Care | Payer: Medicare Other | Source: Ambulatory Visit | Attending: Pulmonary Disease | Admitting: Pulmonary Disease

## 2016-03-17 DIAGNOSIS — N281 Cyst of kidney, acquired: Secondary | ICD-10-CM | POA: Insufficient documentation

## 2016-03-17 DIAGNOSIS — R1030 Lower abdominal pain, unspecified: Secondary | ICD-10-CM

## 2016-03-17 DIAGNOSIS — M47814 Spondylosis without myelopathy or radiculopathy, thoracic region: Secondary | ICD-10-CM | POA: Diagnosis not present

## 2016-03-17 DIAGNOSIS — K6389 Other specified diseases of intestine: Secondary | ICD-10-CM | POA: Insufficient documentation

## 2016-03-17 DIAGNOSIS — K5730 Diverticulosis of large intestine without perforation or abscess without bleeding: Secondary | ICD-10-CM | POA: Insufficient documentation

## 2016-03-17 DIAGNOSIS — Z90710 Acquired absence of both cervix and uterus: Secondary | ICD-10-CM | POA: Insufficient documentation

## 2016-03-17 LAB — POCT I-STAT CREATININE: Creatinine, Ser: 1.6 mg/dL — ABNORMAL HIGH (ref 0.44–1.00)

## 2016-05-12 ENCOUNTER — Encounter: Payer: Self-pay | Admitting: Pulmonary Disease

## 2016-05-12 DIAGNOSIS — I10 Essential (primary) hypertension: Secondary | ICD-10-CM | POA: Diagnosis not present

## 2016-05-12 DIAGNOSIS — I129 Hypertensive chronic kidney disease with stage 1 through stage 4 chronic kidney disease, or unspecified chronic kidney disease: Secondary | ICD-10-CM | POA: Diagnosis not present

## 2016-05-12 DIAGNOSIS — J449 Chronic obstructive pulmonary disease, unspecified: Secondary | ICD-10-CM | POA: Diagnosis not present

## 2016-05-12 DIAGNOSIS — N183 Chronic kidney disease, stage 3 (moderate): Secondary | ICD-10-CM | POA: Diagnosis not present

## 2016-05-12 DIAGNOSIS — F321 Major depressive disorder, single episode, moderate: Secondary | ICD-10-CM | POA: Diagnosis not present

## 2016-06-07 DIAGNOSIS — R1030 Lower abdominal pain, unspecified: Secondary | ICD-10-CM | POA: Diagnosis not present

## 2016-06-07 DIAGNOSIS — J449 Chronic obstructive pulmonary disease, unspecified: Secondary | ICD-10-CM | POA: Diagnosis not present

## 2016-06-14 ENCOUNTER — Other Ambulatory Visit (HOSPITAL_COMMUNITY): Payer: Self-pay | Admitting: Pulmonary Disease

## 2016-06-14 DIAGNOSIS — R1031 Right lower quadrant pain: Secondary | ICD-10-CM

## 2016-06-20 ENCOUNTER — Encounter (HOSPITAL_COMMUNITY): Payer: Self-pay | Admitting: Emergency Medicine

## 2016-06-20 ENCOUNTER — Emergency Department (HOSPITAL_COMMUNITY)
Admission: EM | Admit: 2016-06-20 | Discharge: 2016-06-20 | Disposition: A | Discharge status: Home / Self Care | Payer: Medicare Other | Attending: Emergency Medicine | Admitting: Emergency Medicine

## 2016-06-20 DIAGNOSIS — J45909 Unspecified asthma, uncomplicated: Secondary | ICD-10-CM | POA: Insufficient documentation

## 2016-06-20 DIAGNOSIS — Y939 Activity, unspecified: Secondary | ICD-10-CM | POA: Insufficient documentation

## 2016-06-20 DIAGNOSIS — Z87891 Personal history of nicotine dependence: Secondary | ICD-10-CM | POA: Diagnosis not present

## 2016-06-20 DIAGNOSIS — Z79899 Other long term (current) drug therapy: Secondary | ICD-10-CM | POA: Diagnosis not present

## 2016-06-20 DIAGNOSIS — Y929 Unspecified place or not applicable: Secondary | ICD-10-CM | POA: Insufficient documentation

## 2016-06-20 DIAGNOSIS — N183 Chronic kidney disease, stage 3 (moderate): Secondary | ICD-10-CM | POA: Diagnosis not present

## 2016-06-20 DIAGNOSIS — J449 Chronic obstructive pulmonary disease, unspecified: Secondary | ICD-10-CM | POA: Insufficient documentation

## 2016-06-20 DIAGNOSIS — I129 Hypertensive chronic kidney disease with stage 1 through stage 4 chronic kidney disease, or unspecified chronic kidney disease: Secondary | ICD-10-CM | POA: Insufficient documentation

## 2016-06-20 DIAGNOSIS — Y999 Unspecified external cause status: Secondary | ICD-10-CM | POA: Diagnosis not present

## 2016-06-20 DIAGNOSIS — N938 Other specified abnormal uterine and vaginal bleeding: Secondary | ICD-10-CM | POA: Insufficient documentation

## 2016-06-20 DIAGNOSIS — E039 Hypothyroidism, unspecified: Secondary | ICD-10-CM | POA: Insufficient documentation

## 2016-06-20 DIAGNOSIS — S3095XA Unspecified superficial injury of vagina and vulva, initial encounter: Secondary | ICD-10-CM | POA: Diagnosis present

## 2016-06-20 DIAGNOSIS — R102 Pelvic and perineal pain: Secondary | ICD-10-CM

## 2016-06-20 MED ORDER — HYDROCODONE-ACETAMINOPHEN 5-325 MG PO TABS
1.0000 | ORAL_TABLET | Freq: Once | ORAL | Status: DC
Start: 2016-06-20 — End: 2016-06-20

## 2016-06-20 MED ORDER — HYDROCODONE-ACETAMINOPHEN 5-325 MG PO TABS
2.0000 | ORAL_TABLET | Freq: Once | ORAL | Status: AC
  Administered 2016-06-20: 2 via ORAL
  Filled 2016-06-20: qty 2
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
## 2016-06-20 NOTE — Discharge Instructions (Signed)
Follow-up with gynecology for further management of the pain.
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
## 2016-06-27 ENCOUNTER — Other Ambulatory Visit (HOSPITAL_COMMUNITY): Payer: Self-pay | Admitting: Pulmonary Disease

## 2016-06-27 ENCOUNTER — Ambulatory Visit (HOSPITAL_COMMUNITY)
Admission: RE | Admit: 2016-06-27 | Discharge: 2016-06-27 | Disposition: A | Discharge status: Home / Self Care | Payer: Medicare Other | Source: Ambulatory Visit | Attending: Pulmonary Disease | Admitting: Pulmonary Disease

## 2016-06-27 DIAGNOSIS — Z90710 Acquired absence of both cervix and uterus: Secondary | ICD-10-CM | POA: Insufficient documentation

## 2016-06-27 DIAGNOSIS — K5730 Diverticulosis of large intestine without perforation or abscess without bleeding: Secondary | ICD-10-CM | POA: Diagnosis not present

## 2016-06-27 DIAGNOSIS — R1031 Right lower quadrant pain: Secondary | ICD-10-CM

## 2016-06-27 DIAGNOSIS — N281 Cyst of kidney, acquired: Secondary | ICD-10-CM | POA: Diagnosis not present

## 2016-06-27 LAB — POCT I-STAT CREATININE: CREATININE: 1.8 mg/dL — AB (ref 0.44–1.00)

## 2016-07-05 DIAGNOSIS — F321 Major depressive disorder, single episode, moderate: Secondary | ICD-10-CM | POA: Diagnosis not present

## 2016-07-05 DIAGNOSIS — K649 Unspecified hemorrhoids: Secondary | ICD-10-CM | POA: Diagnosis not present

## 2016-07-05 DIAGNOSIS — I10 Essential (primary) hypertension: Secondary | ICD-10-CM | POA: Diagnosis not present

## 2016-07-05 DIAGNOSIS — J449 Chronic obstructive pulmonary disease, unspecified: Secondary | ICD-10-CM | POA: Diagnosis not present

## 2016-07-10 ENCOUNTER — Telehealth (INDEPENDENT_AMBULATORY_CARE_PROVIDER_SITE_OTHER): Payer: Self-pay | Admitting: Internal Medicine

## 2016-07-10 NOTE — Telephone Encounter (Signed)
Referral placed, [facility] will call patient to sch'd appt, patient aware

## 2016-07-10 NOTE — Telephone Encounter (Signed)
Patient called, she would like a referral to Dr. Oneida Alar for hemorrhoids.  [phone]

## 2016-07-10 NOTE — Telephone Encounter (Signed)
Ann - may we go ahead and make a referral to Dr.Fields for the patient's hemorrhoids?

## 2016-07-18 ENCOUNTER — Telehealth (HOSPITAL_COMMUNITY): Payer: Self-pay | Admitting: *Deleted

## 2016-07-18 NOTE — Telephone Encounter (Signed)
ERROR

## 2016-07-18 NOTE — Telephone Encounter (Signed)
Phone call, spoke with patient, she said she do not want to schedule an appointment right now due to surgery.   She said she will call if needed.

## 2016-08-08 ENCOUNTER — Ambulatory Visit (INDEPENDENT_AMBULATORY_CARE_PROVIDER_SITE_OTHER): Payer: Medicare Other | Admitting: Internal Medicine

## 2016-08-08 ENCOUNTER — Encounter: Payer: Self-pay | Admitting: Internal Medicine

## 2016-08-08 VITALS — BP 175/80 | HR 60 | Temp 96.7°F | Ht 60.0 in | Wt 151.4 lb

## 2016-08-08 DIAGNOSIS — K5909 Other constipation: Secondary | ICD-10-CM | POA: Diagnosis not present

## 2016-08-08 DIAGNOSIS — K648 Other hemorrhoids: Secondary | ICD-10-CM | POA: Diagnosis not present

## 2016-08-08 NOTE — Patient Instructions (Signed)
Avoid straining.  Benefiber 1 tablespoon twice daily  Limit toilet time to 5 minutes  Begin linzess 72 capsules - one daily - samples provided  Call with any interim problems  Schedule followup appointment in 4 weeks from now  Information on constipation provided

## 2016-08-08 NOTE — Progress Notes (Signed)
[place] banding procedure note:  The patient presents with symptomatic grade 3 hemorrhoids; negative colonoscopy otherwise last year. Topical agents have not helped. She continues to have problems with chronic constipation; she presents today desiring a hemorrhoid banding.   All risks, benefits, limitations and alternative forms of therapy were described and informed consent was obtained.  In the left lateral decubitus position, a DRE revealed no abnormalities. Anoscopy performed. Prominent left and right hemorrhoid columns identified.  The decision was made to band the right anterior internal hemorrhoid;  the [place] was used to perform band ligation without complication. Digital anorectal examination was then performed to assure proper positioning of the band;   band found to be in excellent position.;  No pinching or pain. I elected to go ahead and put a band on the left lateral hemorrhoid column.  No pinching or pain. Band found to be in excellent position.   Need to optimize management of constipation.  Will start Linzess 72 g capsule 1 daily. Samples provided. Also begin Benefiber 1 tablespoon twice daily.  Office visit with [place] in 4 weeks to reassess.  No complications were encountered and the patient tolerated the procedure well.

## 2016-08-09 DIAGNOSIS — L989 Disorder of the skin and subcutaneous tissue, unspecified: Secondary | ICD-10-CM | POA: Diagnosis not present

## 2016-08-09 DIAGNOSIS — J449 Chronic obstructive pulmonary disease, unspecified: Secondary | ICD-10-CM | POA: Diagnosis not present

## 2016-08-09 DIAGNOSIS — F419 Anxiety disorder, unspecified: Secondary | ICD-10-CM | POA: Diagnosis not present

## 2016-08-09 DIAGNOSIS — I129 Hypertensive chronic kidney disease with stage 1 through stage 4 chronic kidney disease, or unspecified chronic kidney disease: Secondary | ICD-10-CM | POA: Diagnosis not present

## 2016-08-16 DIAGNOSIS — F329 Major depressive disorder, single episode, unspecified: Secondary | ICD-10-CM | POA: Diagnosis not present

## 2016-08-16 DIAGNOSIS — J449 Chronic obstructive pulmonary disease, unspecified: Secondary | ICD-10-CM | POA: Diagnosis not present

## 2016-08-16 DIAGNOSIS — E039 Hypothyroidism, unspecified: Secondary | ICD-10-CM | POA: Diagnosis not present

## 2016-08-16 DIAGNOSIS — L989 Disorder of the skin and subcutaneous tissue, unspecified: Secondary | ICD-10-CM | POA: Diagnosis not present

## 2016-08-18 ENCOUNTER — Other Ambulatory Visit (HOSPITAL_COMMUNITY): Payer: Self-pay | Admitting: Pulmonary Disease

## 2016-08-18 ENCOUNTER — Ambulatory Visit (HOSPITAL_COMMUNITY)
Admission: RE | Admit: 2016-08-18 | Discharge: 2016-08-18 | Disposition: A | Discharge status: Home / Self Care | Payer: Medicare Other | Source: Ambulatory Visit | Attending: Pulmonary Disease | Admitting: Pulmonary Disease

## 2016-08-18 ENCOUNTER — Encounter: Payer: Self-pay | Admitting: Pulmonary Disease

## 2016-08-18 DIAGNOSIS — L989 Disorder of the skin and subcutaneous tissue, unspecified: Secondary | ICD-10-CM

## 2016-08-18 DIAGNOSIS — F329 Major depressive disorder, single episode, unspecified: Secondary | ICD-10-CM | POA: Diagnosis not present

## 2016-08-18 DIAGNOSIS — R51 Headache: Secondary | ICD-10-CM | POA: Diagnosis not present

## 2016-08-18 DIAGNOSIS — E039 Hypothyroidism, unspecified: Secondary | ICD-10-CM | POA: Diagnosis not present

## 2016-08-18 DIAGNOSIS — J449 Chronic obstructive pulmonary disease, unspecified: Secondary | ICD-10-CM | POA: Diagnosis not present

## 2016-08-23 ENCOUNTER — Encounter (HOSPITAL_COMMUNITY): Payer: Self-pay

## 2016-08-23 ENCOUNTER — Encounter (HOSPITAL_COMMUNITY)
Admission: RE | Admit: 2016-08-23 | Discharge: 2016-08-23 | Disposition: A | Discharge status: Home / Self Care | Payer: Medicare Other | Source: Ambulatory Visit | Attending: Pulmonary Disease | Admitting: Pulmonary Disease

## 2016-08-23 DIAGNOSIS — M810 Age-related osteoporosis without current pathological fracture: Secondary | ICD-10-CM | POA: Diagnosis not present

## 2016-08-23 LAB — COMPREHENSIVE METABOLIC PANEL
ALK PHOS: 71 U/L (ref 38–126)
ALT: 13 U/L — AB (ref 14–54)
AST: 18 U/L (ref 15–41)
Albumin: 3.9 g/dL (ref 3.5–5.0)
Anion gap: 10 (ref 5–15)
BUN: 42 mg/dL — ABNORMAL HIGH (ref 6–20)
CO2: 25 mmol/L (ref 22–32)
CREATININE: 1.95 mg/dL — AB (ref 0.44–1.00)
Calcium: 9.3 mg/dL (ref 8.9–10.3)
Chloride: 104 mmol/L (ref 101–111)
GFR, EST AFRICAN AMERICAN: 27 mL/min — AB (ref >60)
GFR, EST NON AFRICAN AMERICAN: 23 mL/min — AB (ref >60)
Glucose, Bld: 131 mg/dL — ABNORMAL HIGH (ref 65–99)
Potassium: 3.2 mmol/L — ABNORMAL LOW (ref 3.5–5.1)
Sodium: 139 mmol/L (ref 135–145)
TOTAL PROTEIN: 7.4 g/dL (ref 6.5–8.1)
Total Bilirubin: 0.4 mg/dL (ref 0.3–1.2)

## 2016-08-23 MED ORDER — SODIUM CHLORIDE 0.9 % IV SOLN
Freq: Once | INTRAVENOUS | Status: AC
  Administered 2016-08-23: 250 mL via INTRAVENOUS

## 2016-08-23 MED ORDER — ZOLEDRONIC ACID 5 MG/100ML IV SOLN
5.0000 mg | Freq: Once | INTRAVENOUS | Status: AC
  Administered 2016-08-23: 5 mg via INTRAVENOUS
  Filled 2016-08-23: qty 100

## 2016-08-23 NOTE — Progress Notes (Signed)
Results for TAMIRA, RYLAND (MRN [id number]) as of 08/23/2016 14:52  Ref. Range 08/23/2016 12:00  Sodium Latest Ref Range: 135 - 145 mmol/L 139  Potassium Latest Ref Range: 3.5 - 5.1 mmol/L 3.2 (L)  Chloride Latest Ref Range: 101 - 111 mmol/L 104  CO2 Latest Ref Range: 22 - 32 mmol/L 25  Glucose Latest Ref Range: 65 - 99 mg/dL 131 (H)  BUN Latest Ref Range: 6 - 20 mg/dL 42 (H)  Creatinine Latest Ref Range: 0.44 - 1.00 mg/dL 1.95 (H)  Calcium Latest Ref Range: 8.9 - 10.3 mg/dL 9.3  Anion gap Latest Ref Range: 5 - 15  10  Alkaline Phosphatase Latest Ref Range: 38 - 126 U/L 71  Albumin Latest Ref Range: 3.5 - 5.0 g/dL 3.9  AST Latest Ref Range: 15 - 41 U/L 18  ALT Latest Ref Range: 14 - 54 U/L 13 (L)  Total Protein Latest Ref Range: 6.5 - 8.1 g/dL 7.4  Total Bilirubin Latest Ref Range: 0.3 - 1.2 mg/dL 0.4  EGFR (African American) Latest Ref Range: >60 mL/min 27 (L)  EGFR (Non-African Amer.) Latest Ref Range: >60 mL/min 23 (L)

## 2016-08-23 NOTE — Discharge Instructions (Signed)

## 2016-09-26 ENCOUNTER — Encounter: Payer: Self-pay | Admitting: Internal Medicine

## 2016-09-26 ENCOUNTER — Ambulatory Visit (INDEPENDENT_AMBULATORY_CARE_PROVIDER_SITE_OTHER): Payer: Medicare Other | Admitting: Internal Medicine

## 2016-09-26 VITALS — BP 145/76 | HR 66 | Temp 97.6°F | Ht 60.0 in | Wt 149.2 lb

## 2016-09-26 DIAGNOSIS — K648 Other hemorrhoids: Secondary | ICD-10-CM

## 2016-09-26 DIAGNOSIS — K219 Gastro-esophageal reflux disease without esophagitis: Secondary | ICD-10-CM

## 2016-09-26 NOTE — Patient Instructions (Signed)
Avoid straining.  Benefiber 1 tablespoon twice daily  Limit toilet time to 5 minutes  Take 17 g of MiraLAX (1 Full capful) in 8 ounces water at bedtime nightly  Continue Prilosec 20 mg daily  Call with any interim problems  Schedule followup appointment in 3 months from now

## 2016-09-26 NOTE — Progress Notes (Signed)
[place] banding procedure note:  The patient presents with symptomatic grade 2 hemorrhoids;  status post banding of the right anterior and left lateral hemorrhoid column previously. She's experienced global improvement in symptoms. She continues to be constipated. Linzess helped but she ran out. Only takes Benefiber once daily. She wants another Band today.  All risks, benefits, and alternative forms of therapy were described and informed consent was obtained. Reflux symptoms well controlled on Prilosec 20 mg daily most of the time. No dysphagia.   In the left lateral decubitus position, DRE reveals no abnormalities. Nitroglycerin 0.125% and Xylocaine gel 2% used for lubricant.  The decision was made to band the right posterior internal hemorrhoid;  the [place] was used to perform band ligation without complication. Digital anorectal examination was then performed to assure proper positioning of the band;  band found to be in  excellent position. No pinching or pain.  The patient was discharged home without pain or other issues. Dietary and behavioral recommendations were given. along with follow-up instructions. The patient will return in 3 months for follow-up appointment.  No complications were encountered and the patient tolerated the procedure well.

## 2016-10-12 ENCOUNTER — Ambulatory Visit (HOSPITAL_COMMUNITY)
Admission: RE | Admit: 2016-10-12 | Discharge: 2016-10-12 | Disposition: A | Discharge status: Home / Self Care | Payer: Medicare Other | Source: Ambulatory Visit | Attending: Pulmonary Disease | Admitting: Pulmonary Disease

## 2016-10-12 ENCOUNTER — Other Ambulatory Visit (HOSPITAL_COMMUNITY): Payer: Self-pay | Admitting: Pulmonary Disease

## 2016-10-12 DIAGNOSIS — R52 Pain, unspecified: Secondary | ICD-10-CM

## 2016-10-12 DIAGNOSIS — I700 Atherosclerosis of aorta: Secondary | ICD-10-CM | POA: Diagnosis not present

## 2016-10-12 DIAGNOSIS — M8580 Other specified disorders of bone density and structure, unspecified site: Secondary | ICD-10-CM | POA: Insufficient documentation

## 2016-10-12 DIAGNOSIS — M25511 Pain in right shoulder: Secondary | ICD-10-CM | POA: Diagnosis present

## 2016-10-12 DIAGNOSIS — R079 Chest pain, unspecified: Secondary | ICD-10-CM | POA: Diagnosis not present

## 2016-10-12 DIAGNOSIS — R918 Other nonspecific abnormal finding of lung field: Secondary | ICD-10-CM | POA: Diagnosis not present

## 2016-10-12 DIAGNOSIS — M19011 Primary osteoarthritis, right shoulder: Secondary | ICD-10-CM | POA: Diagnosis not present

## 2016-10-12 DIAGNOSIS — R071 Chest pain on breathing: Secondary | ICD-10-CM

## 2016-10-18 ENCOUNTER — Telehealth: Payer: Self-pay

## 2016-10-18 NOTE — Telephone Encounter (Signed)
Pt called, she is having a little bit of pain with her hemorrhoids, she wants to know if it is ok for her to use preparation H prn until she comes back in October?

## 2016-10-18 NOTE — Telephone Encounter (Signed)
Yes. BID for 7 days then take a break. Use prn .

## 2016-10-18 NOTE — Telephone Encounter (Signed)
Pt is aware.  

## 2016-11-08 DIAGNOSIS — J449 Chronic obstructive pulmonary disease, unspecified: Secondary | ICD-10-CM | POA: Diagnosis not present

## 2016-11-08 DIAGNOSIS — I10 Essential (primary) hypertension: Secondary | ICD-10-CM | POA: Diagnosis not present

## 2016-11-08 DIAGNOSIS — N183 Chronic kidney disease, stage 3 (moderate): Secondary | ICD-10-CM | POA: Diagnosis not present

## 2016-11-08 DIAGNOSIS — F322 Major depressive disorder, single episode, severe without psychotic features: Secondary | ICD-10-CM | POA: Diagnosis not present

## 2016-11-23 ENCOUNTER — Telehealth (HOSPITAL_COMMUNITY): Payer: Self-pay | Admitting: *Deleted

## 2016-11-23 NOTE — Telephone Encounter (Signed)
PATIENT CANCELLED, APPOINTMENT FOR December 19, 2016.   SAID SHE STAY SICK ALL THE TIME.   SHE SAID SHE WILL CALL BACK TO RESCHEDULE.

## 2016-11-28 NOTE — Telephone Encounter (Signed)
Per front staff, PATIENT CANCELLED, APPOINTMENT FOR December 19, 2016.   SAID SHE STAY SICK ALL THE TIME.   SHE SAID SHE WILL CALL BACK TO RESCHEDULE

## 2016-11-29 NOTE — Telephone Encounter (Signed)
Noted  

## 2016-12-05 DIAGNOSIS — I10 Essential (primary) hypertension: Secondary | ICD-10-CM | POA: Diagnosis not present

## 2016-12-05 DIAGNOSIS — N183 Chronic kidney disease, stage 3 (moderate): Secondary | ICD-10-CM | POA: Diagnosis not present

## 2016-12-05 DIAGNOSIS — J441 Chronic obstructive pulmonary disease with (acute) exacerbation: Secondary | ICD-10-CM | POA: Diagnosis not present

## 2016-12-05 DIAGNOSIS — R079 Chest pain, unspecified: Secondary | ICD-10-CM | POA: Diagnosis not present

## 2016-12-19 ENCOUNTER — Ambulatory Visit (HOSPITAL_COMMUNITY): Payer: Self-pay | Admitting: Psychiatry

## 2017-01-02 ENCOUNTER — Encounter: Payer: Self-pay | Admitting: Internal Medicine

## 2017-01-02 ENCOUNTER — Ambulatory Visit (INDEPENDENT_AMBULATORY_CARE_PROVIDER_SITE_OTHER): Payer: Medicare Other | Admitting: Internal Medicine

## 2017-01-02 VITALS — BP 142/75 | HR 21 | Temp 97.1°F | Ht 60.0 in | Wt 150.6 lb

## 2017-01-02 DIAGNOSIS — K5909 Other constipation: Secondary | ICD-10-CM | POA: Diagnosis not present

## 2017-01-02 DIAGNOSIS — K219 Gastro-esophageal reflux disease without esophagitis: Secondary | ICD-10-CM

## 2017-01-02 DIAGNOSIS — K642 Third degree hemorrhoids: Secondary | ICD-10-CM | POA: Diagnosis not present

## 2017-01-02 NOTE — Patient Instructions (Addendum)
GERD information provided  Continue Omeprazole daily  Constipation information provided  Benefiber  - I heaping tablespoon daily  Miralax 1 capful twice daily  Office visit in 6 weeks; may or may not need one more Banding

## 2017-01-02 NOTE — Progress Notes (Signed)
Primary Care Physician:  Sinda Du, MD Primary Gastroenterologist:  Dr. Gala Romney  Pre-Procedure History & Physical: HPI:  [name] is a 81 y.o. female here for follow-up GERD and hemorrhoids/constipation. Did very well with hemorrhoid banding. She's been constipated lately, however. She notes hemorrhoid protrusion sometimes  - she reduces it with her finger. Chronic constipation  -  has tried various over-the-counter remedies. Typically, may go 2-3 days without a bowel movement. Has used enemas and suppositories as well. No bleeding. GERD symptoms well controlled on omeprazole 40 mg daily. No dysphagia.  Past Medical History:  Diagnosis Date  . Arteriosclerotic cardiovascular disease (ASCVD) 2006   Nonobstructive; < 50% lesions on cath 2002; negative stress nuclear study in 10/2004  . Asthma   . Chronic pelvic pain in female   . Colitis due to Clostridium difficile [place]  . COPD (chronic obstructive pulmonary disease) ([place])   . Depression with anxiety   . Gastroesophageal reflux disease   . Hyperlipidemia   . Hypertension   . Hypothyroidism   . S/P colonoscopy 2007   few diverticula, otherwise nl  . S/P colonoscopy 12/13/10   rectal, cecal polyp, left-sided diverticulosis, hyperplastic. ?anal fissure? treated empirically  . S/P endoscopy 2009   linear esophageal erosions  . S/P endoscopy 05/10/10   Question island of salmon-colored epithelium in distal esophagus ; no Barrett's.   . Shingles   . Tobacco abuse, in remission    55-pack-year consumption; discontinued 08/2010    Past Surgical History:  Procedure Laterality Date  . ABDOMINAL HYSTERECTOMY    . BREAST LUMPECTOMY    . CATARACT EXTRACTION    . CHOLECYSTECTOMY  1962  . COLONOSCOPY  12/13/2010   Left-sided diverticulosis.  Cecal polyp, status post hot snare polypectomy/ Diminutive rectal polyp, status post cold biopsy removal tender/painful anal canal, ? occult anal fissure- Not visualized  . COLONOSCOPY  N/A 10/14/2015   Procedure: COLONOSCOPY;  Surgeon: Rogene Houston, MD;  [facility];  Service: Endoscopy;  Laterality: N/A;  2:50  . ESOPHAGOGASTRODUODENOSCOPY  05/10/10   benign mucosa with mild chronic inflammation.  Marland Kitchen FLEXIBLE SIGMOIDOSCOPY  12/29/2011   Procedure: FLEXIBLE SIGMOIDOSCOPY;  Surgeon: Rogene Houston, MD;  [facility];  Service: Endoscopy;  Laterality: N/A;  200-Ann notified pt to be here @ 1:00  . NISSEN FUNDOPLICATION    . YAG LASER APPLICATION Left 1/61/0960   Procedure: YAG LASER APPLICATION;  Surgeon: Williams Che, MD;  Location: [facility];  Service: Ophthalmology;  Laterality: Left;    Prior to Admission medications   Medication Sig Start Date End Date Taking? Authorizing Provider  alendronate (FOSAMAX) 70 MG tablet Take 70 mg by mouth every Monday. Take with a full glass of water on an empty stomach.    Yes [provider]  ALPRAZolam Duanne Moron) 0.5 MG tablet Take 0.5 mg by mouth 4 (four) times daily as needed for anxiety. For anxiety.   Yes [provider]  atorvastatin (LIPITOR) 20 MG tablet Take 10 mg by mouth every evening.    Yes [provider]  ESTRACE VAGINAL 0.1 MG/GM vaginal cream Apply 1 application topically 3 (three) times a week. 06/30/14  Yes [provider]  fish oil-omega-3 fatty acids 1000 MG capsule Take 2 g by mouth at bedtime.    Yes [provider]  FLUoxetine (PROZAC) 10 MG tablet Take 10 mg by mouth daily. 06/06/16  Yes [provider]  hydrochlorothiazide (HYDRODIURIL) 25  MG tablet Take 25 mg by mouth daily.   Yes [provider]  HYDROcodone-acetaminophen (NORCO) 10-325 MG tablet Take 0.5-2 tablets by mouth every 4 (four) hours as needed (1/2 tablet for mild pain, 1 tablet for moderate pain, 2 tablets for severe pain). 05/29/15  Yes Riebock, Emina, NP  hydroxypropyl methylcellulose (ISOPTO TEARS) 2.5 % ophthalmic solution Place 1 drop into both eyes 3 (three) times  daily as needed. For dry eyes.   Yes [provider]  levothyroxine (SYNTHROID, LEVOTHROID) 88 MCG tablet Take 88 mcg by mouth daily. 09/29/15  Yes [provider]  Multiple Vitamins-Minerals (EYE VITAMINS PO) Take 1 tablet by mouth 2 (two) times daily.   Yes [provider]  omeprazole (PRILOSEC) 40 MG capsule Take 40 mg by mouth daily. 05/02/16  Yes [provider]  OVER THE COUNTER MEDICATION [place] suppository as needed   Yes [provider]  PROCTO-MED HC 2.5 % rectal cream Apply 1 application topically daily as needed for hemorrhoids or itching.  06/17/16  Yes [provider]  verapamil (CALAN-SR) 180 MG CR tablet Take 180 mg by mouth 2 (two) times daily.  11/19/15  Yes [provider]  [place] Dextrin (BENEFIBER) POWD Take by mouth. Takes 1 tbsp once a day   Yes [provider]    Allergies as of 01/02/2017 - Review Complete 01/02/2017  Allergen Reaction Noted  . Paroxetine Itching   . Sulfa antibiotics Other (See Comments) 12/13/2011  . Venlafaxine Itching   . Nalbuphine Rash     Family History  Problem Relation Age of Onset  . Diabetes Mother   . Alzheimer's disease Mother   . Heart disease Father   . Heart disease Brother        Also mother, Father, brother and 2 sons  . Aneurysm Son   . Diabetes Son   . Hypertension Brother        also Sister x2  . Colon cancer Neg Hx     Social History   Social History  . Marital status: Married    Spouse name: N/A  . Number of children: 4  . Years of education: N/A   Occupational History  . Not on file.   Social History Main Topics  . Smoking status: Former Smoker    Packs/day: 1.00    Years: 55.00    Quit date: 09/15/2010  . Smokeless tobacco: Never Used  . Alcohol use No  . Drug use: No  . Sexual activity: Not Currently   Other Topics Concern  . Not on file   Social History Narrative  . No narrative on file    Review of Systems: See HPI, otherwise  negative ROS  Physical Exam: BP (!) 142/75   Pulse (!) 21   Temp (!) 97.1 F (36.2 C) (Oral)   Ht 5' (1.524 m)   Wt 150 lb 9.6 oz (68.3 kg)   BMI 29.41 kg/m  General:   Alert,  , pleasant and cooperative in NAD Neck:  Supple; no masses or thyromegaly. No significant cervical adenopathy. Lungs:  Clear throughout to auscultation.   No wheezes, crackles, or rhonchi. No acute distress. Heart:  Regular rate and rhythm; no murmurs, clicks, rubs,  or gallops. Abdomen: Non-distended, normal bowel sounds.  Soft and nontender without appreciable mass or hepatosplenomegaly.  Pulses:  Normal pulses noted. Extremities:  Without clubbing or edema.  Impression:  Pleasant 81 year old lady with well-controlled GERD. Still having issues with constipation. Sounds like she has  a recurrent grade 3 hemorrhoid without any alarm signs/symptoms. Digital anal manipulation likely exacerbating her hemorrhoidal situation.  Recommendations:   GERD information provided  Continue Omeprazole daily  Constipation information provided  Benefiber  - I heaping tablespoon daily  Miralax 1 capful twice daily  Office visit in 6 weeks; may or may not need one more Banding          Notice: This dictation was prepared with Dragon dictation along with smaller phrase technology. Any transcriptional errors that result from this process are unintentional and may not be corrected upon review.

## 2017-01-12 DIAGNOSIS — R079 Chest pain, unspecified: Secondary | ICD-10-CM | POA: Diagnosis not present

## 2017-01-12 DIAGNOSIS — F321 Major depressive disorder, single episode, moderate: Secondary | ICD-10-CM | POA: Diagnosis not present

## 2017-01-12 DIAGNOSIS — I10 Essential (primary) hypertension: Secondary | ICD-10-CM | POA: Diagnosis not present

## 2017-01-12 DIAGNOSIS — J449 Chronic obstructive pulmonary disease, unspecified: Secondary | ICD-10-CM | POA: Diagnosis not present

## 2017-01-15 ENCOUNTER — Other Ambulatory Visit (HOSPITAL_COMMUNITY): Payer: Self-pay | Admitting: Pulmonary Disease

## 2017-01-15 ENCOUNTER — Ambulatory Visit (HOSPITAL_COMMUNITY)
Admission: RE | Admit: 2017-01-15 | Discharge: 2017-01-15 | Disposition: A | Discharge status: Home / Self Care | Payer: Medicare Other | Source: Ambulatory Visit | Attending: Pulmonary Disease | Admitting: Pulmonary Disease

## 2017-01-15 DIAGNOSIS — M25511 Pain in right shoulder: Secondary | ICD-10-CM | POA: Diagnosis not present

## 2017-01-15 DIAGNOSIS — M25561 Pain in right knee: Secondary | ICD-10-CM

## 2017-01-15 DIAGNOSIS — M179 Osteoarthritis of knee, unspecified: Secondary | ICD-10-CM | POA: Diagnosis not present

## 2017-01-15 DIAGNOSIS — M25562 Pain in left knee: Secondary | ICD-10-CM | POA: Diagnosis present

## 2017-01-15 DIAGNOSIS — R079 Chest pain, unspecified: Secondary | ICD-10-CM

## 2017-02-08 ENCOUNTER — Encounter: Payer: Self-pay | Admitting: Pulmonary Disease

## 2017-02-08 DIAGNOSIS — N390 Urinary tract infection, site not specified: Secondary | ICD-10-CM | POA: Diagnosis not present

## 2017-02-08 DIAGNOSIS — Z23 Encounter for immunization: Secondary | ICD-10-CM | POA: Diagnosis not present

## 2017-02-13 ENCOUNTER — Telehealth: Payer: Self-pay

## 2017-02-13 ENCOUNTER — Encounter: Payer: Self-pay | Admitting: Internal Medicine

## 2017-02-13 ENCOUNTER — Other Ambulatory Visit: Payer: Self-pay

## 2017-02-13 ENCOUNTER — Ambulatory Visit: Payer: Medicare Other | Admitting: Internal Medicine

## 2017-02-13 VITALS — BP 153/75 | HR 54 | Temp 96.9°F | Ht 60.0 in | Wt 148.8 lb

## 2017-02-13 DIAGNOSIS — K5909 Other constipation: Secondary | ICD-10-CM

## 2017-02-13 DIAGNOSIS — K602 Anal fissure, unspecified: Secondary | ICD-10-CM | POA: Diagnosis not present

## 2017-02-13 NOTE — Progress Notes (Signed)
Primary Care Physician:  Sinda Du, MD Primary Gastroenterologist:  Dr. Gala Romney  Pre-Procedure History & Physical: HPI:  [name] is a 81 y.o. female here for for follow-up of anorectal issues. Her hemorrhoid symptoms have resolved after having 3 bands placed on 3 columns. She has severe pain just before during and after bowel movement from time to time. No bleeding. No hemorrhoidal protrusion. Still issues with constipation not effective agent has to strain. Bowel movement every 2-3 days. Not taking lens S. Not taking MiraLAX. Benefiber 1 tablespoon daily.  Past Medical History:  Diagnosis Date  . Arteriosclerotic cardiovascular disease (ASCVD) 2006   Nonobstructive; < 50% lesions on cath 2002; negative stress nuclear study in 10/2004  . Asthma   . Chronic pelvic pain in female   . Colitis due to Clostridium difficile [place]  . COPD (chronic obstructive pulmonary disease) ([place])   . Depression with anxiety   . Gastroesophageal reflux disease   . Hyperlipidemia   . Hypertension   . Hypothyroidism   . S/P colonoscopy 2007   few diverticula, otherwise nl  . S/P colonoscopy 12/13/10   rectal, cecal polyp, left-sided diverticulosis, hyperplastic. ?anal fissure? treated empirically  . S/P endoscopy 2009   linear esophageal erosions  . S/P endoscopy 05/10/10   Question island of salmon-colored epithelium in distal esophagus ; no Barrett's.   . Shingles   . Tobacco abuse, in remission    55-pack-year consumption; discontinued 08/2010    Past Surgical History:  Procedure Laterality Date  . ABDOMINAL HYSTERECTOMY    . BREAST LUMPECTOMY    . CATARACT EXTRACTION    . CHOLECYSTECTOMY  1962  . COLONOSCOPY N/A 10/14/2015   Performed by Rogene Houston, MD at [facility]  . COLONOSCOPY N/A 12/13/2010   Performed by Daneil Dolin, MD at [facility]  . ESOPHAGOGASTRODUODENOSCOPY  05/10/10   benign mucosa with mild chronic inflammation.  Marland Kitchen FLEXIBLE SIGMOIDOSCOPY N/A  12/29/2011   Performed by Rogene Houston, MD at [place]  . NISSEN FUNDOPLICATION    . YAG LASER APPLICATION Left 07/03/8117   Performed by Williams Che, MD at [facility]    Prior to Admission medications   Medication Sig Start Date End Date Taking? Authorizing Provider  ALPRAZolam Duanne Moron) 0.5 MG tablet Take 0.5 mg by mouth 4 (four) times daily as needed for anxiety. For anxiety.   Yes [provider]  atorvastatin (LIPITOR) 20 MG tablet Take 20 mg every evening by mouth.    Yes [provider]  escitalopram (LEXAPRO) 10 MG tablet Take 10 mg daily by mouth.   Yes [provider]  ESTRACE VAGINAL 0.1 MG/GM vaginal cream Apply 1 application topically 3 (three) times a week. 06/30/14  Yes [provider]  fish oil-omega-3 fatty acids 1000 MG capsule Take 2 g by mouth at bedtime.    Yes [provider]  hydrochlorothiazide (HYDRODIURIL) 25 MG tablet Take 25 mg by mouth daily.   Yes [provider]  HYDROcodone-acetaminophen (NORCO) 10-325 MG tablet Take 0.5-2 tablets by mouth every 4 (four) hours as needed (1/2 tablet for mild pain, 1 tablet for moderate pain, 2 tablets for severe pain). 05/29/15  Yes Riebock, Emina, NP  hydroxypropyl methylcellulose (ISOPTO TEARS) 2.5 % ophthalmic solution Place 1 drop into both eyes 3 (three) times daily as needed. For dry eyes.   Yes [provider]  levothyroxine (SYNTHROID, LEVOTHROID) 88 MCG tablet Take 88 mcg  by mouth daily. 09/29/15  Yes [provider]  Multiple Vitamins-Minerals (EYE VITAMINS PO) Take 1 tablet by mouth 2 (two) times daily.   Yes [provider]  omeprazole (PRILOSEC) 40 MG capsule Take 40 mg by mouth daily. 05/02/16  Yes [provider]  OVER THE COUNTER MEDICATION Fleet suppository as needed   Yes [provider]  PROCTO-MED HC 2.5 % rectal cream Apply 1 application topically daily as needed for hemorrhoids or itching.  06/17/16  Yes [provider]  verapamil (CALAN-SR) 180 MG CR tablet Take 180 mg by mouth 2 (two) times daily.  11/19/15  Yes [provider]  Wheat Dextrin (BENEFIBER) POWD Take by mouth. Takes 1 tbsp once a day   Yes [provider]  alendronate (FOSAMAX) 70 MG tablet Take 70 mg by mouth every Monday. Take with a full glass of water on an empty stomach.     [provider]  FLUoxetine (PROZAC) 10 MG tablet Take 10 mg by mouth daily. 06/06/16   [provider]    Allergies as of 02/13/2017 - Review Complete 02/13/2017  Allergen Reaction Noted  . Paroxetine Itching   . Sulfa antibiotics Other (See Comments) 12/13/2011  . Venlafaxine Itching   . Nalbuphine Rash     Family History  Problem Relation Age of Onset  . Diabetes Mother   . Alzheimer's disease Mother   . Heart disease Father   . Heart disease Brother        Also mother, Father, brother and 2 sons  . Aneurysm Son   . Diabetes Son   . Hypertension Brother        also Sister x2  . Colon cancer Neg Hx     Social History   Socioeconomic History  . Marital status: Married    Spouse name: Not on file  . Number of children: 4  . Years of education: Not on file  . Highest education level: Not on file  Social Needs  . Financial resource strain: Not on file  . Food insecurity - worry: Not on file  . Food insecurity - inability: Not on file  . Transportation needs - medical: Not on file  . Transportation needs - non-medical: Not on file  Occupational History  . Not on file  Tobacco Use  . Smoking status: Former Smoker    Packs/day: 1.00    Years: 55.00    Pack years: 55.00    Last attempt to quit: 09/15/2010    Years since quitting: 6.4  . Smokeless tobacco: Never Used  Substance and Sexual Activity  . Alcohol use: No    Alcohol/week: 0.0 oz  . Drug use: No  . Sexual activity: Not Currently  Other Topics Concern  . Not on file  Social History Narrative  . Not on file    Review of  Systems: See HPI, otherwise negative ROS  Physical Exam: BP (!) 153/75   Pulse (!) 54   Temp (!) 96.9 F (36.1 C) (Oral)   Ht 5' (1.524 m)   Wt 148 lb 12.8 oz (67.5 kg)   BMI 29.06 kg/m  General:   Alert,  Well-developed, well-nourished, pleasant and cooperative in NAD Digital rectal exam revealed no external lesions. Digital exam revealed a bit of a defect at the 6:00 position she had focal tenderness in this area. No other abnormalities.    Impression:    Successful recent hemorrhoid banding. 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 Discusses with the patient at length. Constipation suboptimally managed at this time.  Recommendations: Increase Benefiber 1 tablespoon twice daily  Nitroglycerin 0.125% compounded  -  Consulting civil engineer. Dispense 30 g. Apply a pea-sized amount to the anorectum 3 times a day  Linzess  290 capsule daily samples provided  Office visit with [place] in 3-4 weeks just before Christmas  As discussed, sometimes anal fissures require a minor surgical procedure.       Notice: This dictation was prepared with Dragon dictation along with smaller phrase technology. Any transcriptional errors that result from this process are unintentional and may not be corrected upon review.

## 2017-02-13 NOTE — Progress Notes (Signed)
Opened in error

## 2017-02-13 NOTE — Patient Instructions (Signed)
   Information on anal fissure provided  Increase Benefiber 1 tablespoon twice daily  Nitroglycerin 0.125% compounded  -  Consulting civil engineer. Dispense 30 g. Apply a pea-sized amount to the anorectum 3 times a day  Linzess  290 capsule daily samples provided  Office visit with [place] in 3-4 weeks just before Christmas  As discussed, sometimes anal fissures require a minor surgical procedure.

## 2017-02-13 NOTE — Telephone Encounter (Signed)
Phoned in Nitroglycerin .125% compound apply a pea size amount to rectum 3 times daily with 1 rf to [employer].

## 2017-03-14 ENCOUNTER — Encounter: Payer: Self-pay | Admitting: Pulmonary Disease

## 2017-03-14 DIAGNOSIS — I10 Essential (primary) hypertension: Secondary | ICD-10-CM | POA: Diagnosis not present

## 2017-03-14 DIAGNOSIS — N183 Chronic kidney disease, stage 3 (moderate): Secondary | ICD-10-CM | POA: Diagnosis not present

## 2017-03-14 DIAGNOSIS — F419 Anxiety disorder, unspecified: Secondary | ICD-10-CM | POA: Diagnosis not present

## 2017-03-14 DIAGNOSIS — F321 Major depressive disorder, single episode, moderate: Secondary | ICD-10-CM | POA: Diagnosis not present

## 2017-03-14 DIAGNOSIS — J449 Chronic obstructive pulmonary disease, unspecified: Secondary | ICD-10-CM | POA: Diagnosis not present

## 2017-03-30 ENCOUNTER — Ambulatory Visit: Payer: Medicare Other | Admitting: Gastroenterology

## 2017-04-18 ENCOUNTER — Ambulatory Visit: Payer: Medicare Other | Admitting: Gastroenterology

## 2017-04-18 ENCOUNTER — Encounter: Payer: Self-pay | Admitting: Gastroenterology

## 2017-04-18 VITALS — BP 181/82 | HR 59 | Temp 97.1°F | Ht 60.0 in | Wt 150.8 lb

## 2017-04-18 DIAGNOSIS — K5900 Constipation, unspecified: Secondary | ICD-10-CM
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
## 2017-04-18 NOTE — Progress Notes (Signed)
CC'ED TO PCP 

## 2017-04-18 NOTE — Assessment & Plan Note (Signed)
82 year old female with chronic constipation, previous hemorrhoid banding X 3 last year, and likely occult anal fissure, returning now doing overall well. Rectal discomfort improved. Using [facility] cream as needed. Linzess 290 mcg causing some abdominal cramping, so we will trial 145 mcg samples. Unfortunately, we do not have this in the office today, so we will make sure she can try this when we do get additional samples.  Return in June 2019.

## 2017-04-18 NOTE — Patient Instructions (Signed)
Let's try Linzess at a lower dosage. Let me know if we need to increase this again.   We will see you in 6 months!

## 2017-04-20 ENCOUNTER — Telehealth: Payer: Self-pay

## 2017-04-20 NOTE — Telephone Encounter (Signed)
Pt notified that Linzess samples are ready for pickup.

## 2017-05-02 ENCOUNTER — Encounter: Payer: Self-pay | Admitting: Pulmonary Disease

## 2017-05-02 DIAGNOSIS — Z79891 Long term (current) use of opiate analgesic: Secondary | ICD-10-CM | POA: Diagnosis not present

## 2017-06-13 DIAGNOSIS — H524 Presbyopia: Secondary | ICD-10-CM | POA: Diagnosis not present

## 2017-06-13 DIAGNOSIS — H26491 Other secondary cataract, right eye: Secondary | ICD-10-CM | POA: Diagnosis not present

## 2017-06-13 DIAGNOSIS — H353133 Nonexudative age-related macular degeneration, bilateral, advanced atrophic without subfoveal involvement: Secondary | ICD-10-CM | POA: Diagnosis not present

## 2017-06-13 DIAGNOSIS — Z961 Presence of intraocular lens: Secondary | ICD-10-CM | POA: Diagnosis not present

## 2017-06-14 ENCOUNTER — Encounter: Payer: Self-pay | Admitting: Pulmonary Disease

## 2017-06-14 DIAGNOSIS — F321 Major depressive disorder, single episode, moderate: Secondary | ICD-10-CM | POA: Diagnosis not present

## 2017-06-14 DIAGNOSIS — N183 Chronic kidney disease, stage 3 (moderate): Secondary | ICD-10-CM | POA: Diagnosis not present

## 2017-06-14 DIAGNOSIS — J449 Chronic obstructive pulmonary disease, unspecified: Secondary | ICD-10-CM | POA: Diagnosis not present

## 2017-06-14 DIAGNOSIS — I129 Hypertensive chronic kidney disease with stage 1 through stage 4 chronic kidney disease, or unspecified chronic kidney disease: Secondary | ICD-10-CM | POA: Diagnosis not present

## 2017-06-14 DIAGNOSIS — I10 Essential (primary) hypertension: Secondary | ICD-10-CM | POA: Diagnosis not present

## 2017-06-29 DIAGNOSIS — H04123 Dry eye syndrome of bilateral lacrimal glands: Secondary | ICD-10-CM | POA: Diagnosis not present

## 2017-06-29 DIAGNOSIS — Z961 Presence of intraocular lens: Secondary | ICD-10-CM | POA: Diagnosis not present

## 2017-06-29 DIAGNOSIS — H4423 Degenerative myopia, bilateral: Secondary | ICD-10-CM | POA: Diagnosis not present

## 2017-06-29 DIAGNOSIS — H35341 Macular cyst, hole, or pseudohole, right eye: Secondary | ICD-10-CM | POA: Diagnosis not present

## 2017-07-16 ENCOUNTER — Ambulatory Visit: Payer: Medicare Other | Admitting: Nurse Practitioner

## 2017-07-25 ENCOUNTER — Emergency Department (HOSPITAL_COMMUNITY): Payer: Medicare Other

## 2017-07-25 ENCOUNTER — Other Ambulatory Visit: Payer: Self-pay

## 2017-07-25 ENCOUNTER — Emergency Department (HOSPITAL_COMMUNITY)
Admission: EM | Admit: 2017-07-25 | Discharge: 2017-07-25 | Disposition: A | Discharge status: Home / Self Care | Payer: Medicare Other | Attending: Emergency Medicine | Admitting: Emergency Medicine

## 2017-07-25 ENCOUNTER — Encounter (HOSPITAL_COMMUNITY): Payer: Self-pay | Admitting: Emergency Medicine

## 2017-07-25 DIAGNOSIS — E039 Hypothyroidism, unspecified: Secondary | ICD-10-CM | POA: Insufficient documentation

## 2017-07-25 DIAGNOSIS — I2510 Atherosclerotic heart disease of native coronary artery without angina pectoris: Secondary | ICD-10-CM | POA: Diagnosis not present

## 2017-07-25 DIAGNOSIS — R0602 Shortness of breath: Secondary | ICD-10-CM | POA: Diagnosis not present

## 2017-07-25 DIAGNOSIS — R079 Chest pain, unspecified: Secondary | ICD-10-CM | POA: Diagnosis not present

## 2017-07-25 DIAGNOSIS — Z87891 Personal history of nicotine dependence: Secondary | ICD-10-CM | POA: Insufficient documentation

## 2017-07-25 DIAGNOSIS — Z79899 Other long term (current) drug therapy: Secondary | ICD-10-CM | POA: Diagnosis not present

## 2017-07-25 DIAGNOSIS — I129 Hypertensive chronic kidney disease with stage 1 through stage 4 chronic kidney disease, or unspecified chronic kidney disease: Secondary | ICD-10-CM | POA: Insufficient documentation

## 2017-07-25 DIAGNOSIS — J449 Chronic obstructive pulmonary disease, unspecified: Secondary | ICD-10-CM | POA: Diagnosis not present

## 2017-07-25 DIAGNOSIS — E876 Hypokalemia: Secondary | ICD-10-CM | POA: Insufficient documentation

## 2017-07-25 DIAGNOSIS — R0600 Dyspnea, unspecified: Secondary | ICD-10-CM | POA: Insufficient documentation

## 2017-07-25 DIAGNOSIS — D649 Anemia, unspecified: Secondary | ICD-10-CM | POA: Insufficient documentation

## 2017-07-25 DIAGNOSIS — N183 Chronic kidney disease, stage 3 (moderate): Secondary | ICD-10-CM | POA: Insufficient documentation

## 2017-07-25 DIAGNOSIS — G8929 Other chronic pain: Secondary | ICD-10-CM | POA: Diagnosis not present

## 2017-07-25 DIAGNOSIS — Z9119 Patient's noncompliance with other medical treatment and regimen: Secondary | ICD-10-CM | POA: Insufficient documentation

## 2017-07-25 LAB — CBC WITH DIFFERENTIAL/PLATELET
BASOS PCT: 0 %
Basophils Absolute: 0 10*3/uL (ref 0.0–0.1)
EOS ABS: 0.1 10*3/uL (ref 0.0–0.7)
EOS PCT: 1 %
HCT: 33.9 % — ABNORMAL LOW (ref 36.0–46.0)
Hemoglobin: 11.3 g/dL — ABNORMAL LOW (ref 12.0–15.0)
Lymphocytes Relative: 38 %
Lymphs Abs: 2.2 10*3/uL (ref 0.7–4.0)
MCH: 28.9 pg (ref 26.0–34.0)
MCHC: 33.3 g/dL (ref 30.0–36.0)
MCV: 86.7 fL (ref 78.0–100.0)
MONOS PCT: 7 %
Monocytes Absolute: 0.4 10*3/uL (ref 0.1–1.0)
NEUTROS PCT: 54 %
Neutro Abs: 3.1 10*3/uL (ref 1.7–7.7)
PLATELETS: 237 10*3/uL (ref 150–400)
RBC: 3.91 MIL/uL (ref 3.87–5.11)
RDW: 15.2 % (ref 11.5–15.5)
WBC: 5.8 10*3/uL (ref 4.0–10.5)

## 2017-07-25 LAB — COMPREHENSIVE METABOLIC PANEL
ALK PHOS: 66 U/L (ref 38–126)
ALT: 21 U/L (ref 14–54)
AST: 18 U/L (ref 15–41)
Albumin: 3.8 g/dL (ref 3.5–5.0)
Anion gap: 14 (ref 5–15)
BUN: 19 mg/dL (ref 6–20)
CALCIUM: 9.4 mg/dL (ref 8.9–10.3)
CO2: 22 mmol/L (ref 22–32)
CREATININE: 1.52 mg/dL — AB (ref 0.44–1.00)
Chloride: 102 mmol/L (ref 101–111)
GFR, EST AFRICAN AMERICAN: 36 mL/min — AB (ref >60)
GFR, EST NON AFRICAN AMERICAN: 31 mL/min — AB (ref >60)
Glucose, Bld: 115 mg/dL — ABNORMAL HIGH (ref 65–99)
Potassium: 3.1 mmol/L — ABNORMAL LOW (ref 3.5–5.1)
Sodium: 138 mmol/L (ref 135–145)
Total Bilirubin: 0.6 mg/dL (ref 0.3–1.2)
Total Protein: 7.2 g/dL (ref 6.5–8.1)

## 2017-07-25 LAB — URINALYSIS, ROUTINE W REFLEX MICROSCOPIC
BILIRUBIN URINE: NEGATIVE
GLUCOSE, UA: NEGATIVE mg/dL
Ketones, ur: NEGATIVE mg/dL
NITRITE: NEGATIVE
PH: 5 (ref 5.0–8.0)
Protein, ur: 30 mg/dL — AB
SPECIFIC GRAVITY, URINE: 1.004 — AB (ref 1.005–1.030)

## 2017-07-25 LAB — TROPONIN I: Troponin I: <0.03 ng/mL (ref <0.03)

## 2017-07-25 LAB — D-DIMER, QUANTITATIVE: D-Dimer, Quant: 0.63 ug/mL-FEU — ABNORMAL HIGH (ref 0.00–0.50)

## 2017-07-25 LAB — MAGNESIUM: Magnesium: 1.7 mg/dL (ref 1.7–2.4)

## 2017-07-25 MED ORDER — AEROCHAMBER PLUS W/MASK MISC
1.0000 | Freq: Once | Status: AC
Start: 2017-07-25 — End: 2017-07-25
  Administered 2017-07-25: 1
  Filled 2017-07-25: qty 1

## 2017-07-25 MED ORDER — POTASSIUM CHLORIDE CRYS ER 20 MEQ PO TBCR
40.0000 meq | EXTENDED_RELEASE_TABLET | Freq: Once | ORAL | Status: AC
  Administered 2017-07-25: 40 meq via ORAL
  Filled 2017-07-25: qty 2

## 2017-07-25 MED ORDER — ALBUTEROL SULFATE (2.5 MG/3ML) 0.083% IN NEBU
5.0000 mg | INHALATION_SOLUTION | Freq: Once | RESPIRATORY_TRACT | Status: AC
  Administered 2017-07-25: 5 mg via RESPIRATORY_TRACT
  Filled 2017-07-25: qty 6

## 2017-07-25 MED ORDER — HYDROCODONE-ACETAMINOPHEN 5-325 MG PO TABS
1.0000 | ORAL_TABLET | Freq: Once | ORAL | Status: AC
  Administered 2017-07-25: 1 via ORAL
  Filled 2017-07-25: qty 1

## 2017-07-25 NOTE — ED Triage Notes (Signed)
SOB with N/ for several days, better with lying flat. Denies CP at this time.
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
## 2017-07-25 NOTE — Discharge Instructions (Signed)
Use your albuterol inhaler with spacer 2 puffs every 4 hours as needed for shortness of breath.  Return if needed more than every 4 hours or see Dr. Luan Pulling in the office.  Take your potassium and all medications as prescribed.  Call Dr. Miguel Rota office tomorrow to arrange to be seen in the office next week.  Your blood potassium level will should be rechecked in his office

## 2017-08-27 ENCOUNTER — Other Ambulatory Visit: Payer: Self-pay | Admitting: *Deleted

## 2017-08-27 ENCOUNTER — Ambulatory Visit: Payer: Medicare Other | Admitting: Nurse Practitioner

## 2017-08-27 ENCOUNTER — Encounter: Payer: Self-pay | Admitting: Nurse Practitioner

## 2017-08-27 VITALS — BP 149/79 | HR 61 | Temp 96.3°F | Ht 60.0 in | Wt 152.2 lb

## 2017-08-27 DIAGNOSIS — K649 Unspecified hemorrhoids: Secondary | ICD-10-CM

## 2017-08-27 DIAGNOSIS — K5900 Constipation, unspecified: Secondary | ICD-10-CM | POA: Diagnosis not present

## 2017-08-27 DIAGNOSIS — R1084 Generalized abdominal pain: Secondary | ICD-10-CM | POA: Diagnosis not present

## 2017-08-27 NOTE — Assessment & Plan Note (Signed)
Described lower abdominal pain which started just prior to bowel movement and typically resolves after a bowel movement.  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  We will start her on Linzess as per above.  On exam, she does have some generalized tenderness.  However, this is not seem severe.  Described as "soreness."  Follow-up in 3 months.  Progress report requested in 1 to 2 weeks on samples of Linzess.

## 2017-08-27 NOTE — Assessment & Plan Note (Signed)
The patient has a chronic history of hemorrhoids.  Multiple attempts of treatment have been made.  She continues with significant hemorrhoid problems.  Previously recommended referral to surgery if no improvement.  At this point we will send her to surgery for referral/evaluation for possible hemorrhoidectomy.  She notes persistent, ongoing rectal pain which typically starts just prior to a bowel movement and last for 1 or 2 hours after bowel movement.  She has not been very compliant with her medication recommendations.  Denies rectal bleeding at this time.  Follow-up in 3 months.

## 2017-08-27 NOTE — Patient Instructions (Signed)
1. I am giving you samples of Linzess 145 mcg. 2. Take this 1 pill, once a day, on an empty stomach. 3. Call us in 1 to 2 weeks and let us know if it is helping. 4. If it is helping we can send a prescription to your pharmacy. 5. We will refer you to Dr. Blake Divine who is a Garment/textile technologist, to discuss possibility of removing your hemorrhoids. 6. Return for follow-up in 3 months. 7. Call us if you have any questions or concerns.  At [facility] we value your feedback. You may receive a survey about your visit today. Please share your experience as we strive to create trusting relationships with our patients to provide genuine, compassionate, quality care.  It was good to meet you today!  I hope you have a wonderful summer!!

## 2017-08-27 NOTE — Assessment & Plan Note (Signed)
The patient has chronic constipation.  Was previously on Linzess 290 mcg which was a little bit much for her.  We recommended in January that she pick up 145 mcg pills.  She did not pick these up.  She is now stating she has significant constipation.  She is taking multiple over-the-counter options, is not sure what she is taking.  I recommended she stop all these, we will provide her with Linzess 145 mcg samples at this time for 1 to 2 weeks and request a progress report 1 to 2 weeks.  If it is effective we can send in a prescription.  Follow-up in 3 months.
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
## 2017-08-28 NOTE — Progress Notes (Signed)
cc'd to pcp 

## 2017-09-06 ENCOUNTER — Encounter: Payer: Self-pay | Admitting: General Surgery

## 2017-09-06 ENCOUNTER — Ambulatory Visit: Payer: Medicare Other | Admitting: General Surgery

## 2017-09-06 VITALS — BP 160/85 | HR 66 | Temp 98.0°F | Resp 18 | Wt 161.0 lb

## 2017-09-06 DIAGNOSIS — K602 Anal fissure, unspecified: Secondary | ICD-10-CM | POA: Diagnosis not present

## 2017-09-06 DIAGNOSIS — N390 Urinary tract infection, site not specified: Secondary | ICD-10-CM

## 2017-09-06 NOTE — Patient Instructions (Addendum)
Keep your stools soft and daily.  Please take Fiber everyday, and take a stool softener or dulcolax over the counter.  Take your Linzess daily as prescribed. Ask the pharmacy how much it costs so you know for the future.  Take the ciprofloxacin for your urinary tract infection.  Use the cream on your anus four times a day as prescribed. You need to use it on your anus four times a day for the next 2 weeks until this fissure is healing.  Do not skip applications. Once it is healed you can use more as needed if you are having the pain again. Take warm baths to help soothe the anus. If you cannot get in the bath tube, ask the Pharmacy for a [place] that you can purchase and use warm water baths on the toilet.   High-Fiber Diet Fiber, also called dietary fiber, is a type of carbohydrate found in fruits, vegetables, whole grains, and beans. A high-fiber diet can have many health benefits. Your health care provider may recommend a high-fiber diet to help:  Prevent constipation. Fiber can make your bowel movements more regular.  Lower your cholesterol.  Relieve hemorrhoids, uncomplicated diverticulosis, or irritable bowel syndrome.  Prevent overeating as part of a weight-loss plan.  Prevent heart disease, type 2 diabetes, and certain cancers.  What is my plan? The recommended daily intake of fiber includes:  38 grams for men under age 44.  35 grams for men over age 57.  63 grams for women under age 76.  106 grams for women over age 1.  You can get the recommended daily intake of dietary fiber by eating a variety of fruits, vegetables, grains, and beans. Your health care provider may also recommend a fiber supplement if it is not possible to get enough fiber through your diet. What do I need to know about a high-fiber diet?  Fiber supplements have not been widely studied for their effectiveness, so it is better to get fiber through food sources.  Always check the fiber  content on thenutrition facts label of any prepackaged food. Look for foods that contain at least 5 grams of fiber per serving.  Ask your dietitian if you have questions about specific foods that are related to your condition, especially if those foods are not listed in the following section.  Increase your daily fiber consumption gradually. Increasing your intake of dietary fiber too quickly may cause bloating, cramping, or gas.  Drink plenty of water. Water helps you to digest fiber. What foods can I eat? Grains Whole-grain breads. Multigrain cereal. Oats and oatmeal. Brown rice. Barley. Bulgur wheat. [place]. Bran muffins. Popcorn. Rye wafer crackers. Vegetables Sweet potatoes. Spinach. Kale. Artichokes. Cabbage. Broccoli. Green peas. Carrots. Squash. Fruits Berries. Pears. Apples. Oranges. Avocados. Prunes and raisins. Dried figs. Meats and Other Protein Sources [place], kidney, pinto, and soy beans. Split peas. Lentils. Nuts and seeds. Dairy Fiber-fortified yogurt. Beverages Fiber-fortified soy milk. Fiber-fortified orange juice. Other Fiber bars. The items listed above may not be a complete list of recommended foods or beverages. Contact your dietitian for more options. What foods are not recommended? Grains White bread. Pasta made with refined flour. White rice. Vegetables Fried potatoes. Canned vegetables. Well-cooked vegetables. Fruits Fruit juice. Cooked, strained fruit. Meats and Other Protein Sources Fatty cuts of meat. Fried [employer] or fried fish. Dairy Milk. Yogurt. Cream cheese. Sour cream. Beverages Soft drinks. Other Cakes and pastries. Butter and oils. The items listed above may not be a complete  list of foods and beverages to avoid. Contact your dietitian for more information. What are some tips for including high-fiber foods in my diet?  Eat a wide variety of high-fiber foods.  Make sure that half of all grains consumed each day are whole grains.  Replace  breads and cereals made from refined flour or white flour with whole-grain breads and cereals.  Replace white rice with brown rice, bulgur wheat, or millet.  Start the day with a breakfast that is high in fiber, such as a cereal that contains at least 5 grams of fiber per serving.  Use beans in place of meat in soups, salads, or pasta.  Eat high-fiber snacks, such as berries, raw vegetables, nuts, or popcorn. This information is not intended to replace advice given to you by your health care provider. Make sure you discuss any questions you have with your health care provider. Document Released: 03/13/2005 Document Revised: 08/19/2015 Document Reviewed: 08/26/2013 Elsevier Interactive Patient Education  2018 [place] Fissure, Adult An anal fissure is a small tear or crack in the skin around the opening of the butt (anus).Bleeding from the tear or crack usually stops on its own within a few minutes. The bleeding may happen every time you poop (have a bowel movement) until the tear or crack heals. Follow these instructions at home: Eating and drinking  Avoid bananas and dairy products. These foods can make it hard to poop.  Drink enough fluid to keep your pee (urine) clear or pale yellow.  Eat a lot of fruit, whole grains, and vegetables. General instructions  Keep the butt area as clean and dry as you can.  Take a warm water bath (sitz bath) as told by your doctor. Do not use soap.  Take over-the-counter and prescription medicines only as told by your doctor.  Use creams or ointments only as told by your doctor.  Keep all follow-up visits as told by your doctor. This is important. Contact a doctor if:  You have more bleeding.  You have a fever.  You have watery poop (diarrhea) that is mixed with blood.  You have pain.  You problem gets worse, not better. This information is not intended to replace advice given to you by your health care provider. Make sure you  discuss any questions you have with your health care provider. Document Released: 11/09/2010 Document Revised: 08/19/2015 Document Reviewed: 06/08/2014 Elsevier Interactive Patient Education  2018 [employer].  How to Take a [employer] A sitz bath is a warm water bath that is taken while you are sitting down. The water should only come up to your hips and should cover your buttocks. Your health care provider may recommend a sitz bath to help you:  Clean the lower part of your body, including your genital area.  With itching.  With pain.  With sore muscles or muscles that tighten or spasm.  How to take a sitz bath Take 3-4 sitz baths per day or as told by your health care provider. 1. Partially fill a bathtub with warm water. You will only need the water to be deep enough to cover your hips and buttocks when you are sitting in it. 2. If your health care provider told you to put medicine in the water, follow the directions exactly. 3. Sit in the water and open the tub drain a little. 4. Turn on the warm water again to keep the tub at the correct level. Keep the water running constantly. 5.  Soak in the water for 15-20 minutes or as told by your health care provider. 6. After the sitz bath, pat the affected area dry first. Do not rub it. 7. Be careful when you stand up after the sitz bath because you may feel dizzy.  Contact a health care provider if:  Your symptoms get worse. Do not continue with sitz baths if your symptoms get worse.  You have new symptoms. Do not continue with sitz baths until you talk with your health care provider. This information is not intended to replace advice given to you by your health care provider. Make sure you discuss any questions you have with your health care provider. Document Released: 12/04/2003 Document Revised: 08/11/2015 Document Reviewed: 03/11/2014 Elsevier Interactive Patient Education  2018 [place] A  disposable sitz bath is a plastic basin that fits over the toilet. A bag is hung above the toilet, and the bag is connected to a tube that opens into the basin. The bag is filled with warm water that flows into the basin through the tube. A sitz bath can be used to help relieve symptoms, clean, and promote healing in the genital and anal areas, as well as in the lower abdomen and buttocks. What are the risks? Sitz baths are generally very safe. It is possible for the skin between the genitals and the anus (perineum) to become infected, but this is rare. You can avoid this by cleaning your sitz bath supplies thoroughly. How to use a disposable sitz bath 1. Close the clamp on the tube. Make sure the clamp is closed tightly to prevent leakage. 2. Fill the sitz bath basin and the plastic bag with warm water. The water should be warm enough to be comfortable, but not hot. 3. Raise the toilet seat and place the filled basin on the toilet. Make sure the overflow opening is facing toward the back of the toilet. ? If you prefer, you may place the empty basin on the toilet first, and then use the plastic bag to fill the basin with warm water. 4. Hang the filled plastic bag overhead on a hook or towel rack close to the toilet. The bag should be higher than the toilet so that the water will flow down through the tube. 5. Attach the tube to the opening on the basin. Make sure that the tube is attached to the basin tightly to prevent leakage. 6. Sit on the basin and release the clamp. This will allow warm water to flow into the basin and flush the area around your genitals and anus. 7. Remain sitting on the basin for about 15-20 minutes, or as long as told by your health care provider. 8. Stand up and gently pat your skin dry. If directed, apply clean bandages (dressings) to the affected area as told by your health care provider. 9. Carefully remove the basin from the toilet seat and tip the basin into the toilet to  empty any remaining water. Empty any remaining water from the plastic bag into the toilet. Then, flush the toilet. 10. Wash the basin with warm water and soap. Let the basin air dry in the sink. You should also let the plastic bag and the tubing air dry. 11. Store the basin, tubing, and plastic bag in a clean, dry area. 12. Wash your hands with soap and water. If soap and water are not available, use hand sanitizer. Contact a health care provider if:  You have symptoms  that get worse instead of better.  You develop new skin irritation, redness, or swelling around your genitals or anus. This information is not intended to replace advice given to you by your health care provider. Make sure you discuss any questions you have with your health care provider. Document Released: 09/12/2011 Document Revised: 08/19/2015 Document Reviewed: 01/31/2015 Elsevier Interactive Patient Education  [employer].

## 2017-09-06 NOTE — Progress Notes (Signed)
[facility] History and Physical  Reason for Referral: Hemorrhoids?  Referring Physician: Walden Field, NP    [name] is a 82 y.o. female.  HPI: [name] is a 82 yo who has complaints of anal pain that is worse with defecation and she describes has "horrible" and "sharp" in nature. She has been evaluated in the past by GI for her chronic constipation and reports taking Linzess but only takes it every other day or so because she is worried about the cost.  She says that if she took it everyday she would have a BM that day. She does do some fiber, but says her stools are always still hard.  She only drinks about 2-3 glasses of water a day.   She denies ever having any bleeding from her anus but did undergo hemorrhoidal banding ? Per documentation from GI.  She says that her main and only complaint is anal pain that is worse with defecation and immediately after defecation.   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 She says she is only ever put preparation H on her anus. She denies any blood per rectum but also says she does not see well. She denies any hygiene issue or bulging or mass from her anus.   Past Medical History:  Diagnosis Date  . Arteriosclerotic cardiovascular disease (ASCVD) 2006   Nonobstructive; < 50% lesions on cath 2002; negative stress nuclear study in 10/2004  . Asthma   . Chronic pelvic pain in female   . Colitis due to Clostridium difficile [place]  . COPD (chronic obstructive pulmonary disease) ([place])   . Depression with anxiety   . Gastroesophageal reflux disease   . Hyperlipidemia   . Hypertension   . Hypothyroidism   . S/P colonoscopy 2007   few diverticula, otherwise nl  . S/P colonoscopy 12/13/10   rectal, cecal polyp, left-sided diverticulosis, hyperplastic. ?anal fissure? treated empirically  . S/P endoscopy 2009   linear esophageal  erosions  . S/P endoscopy 05/10/10   Question island of salmon-colored epithelium in distal esophagus ; no Barrett's.   . Shingles   . Tobacco abuse, in remission    55-pack-year consumption; discontinued 08/2010    Past Surgical History:  Procedure Laterality Date  . ABDOMINAL HYSTERECTOMY    . BREAST LUMPECTOMY    . CATARACT EXTRACTION    . CHOLECYSTECTOMY  1962  . COLONOSCOPY  12/13/2010   Left-sided diverticulosis.  Cecal polyp, status post hot snare polypectomy/ Diminutive rectal polyp, status post cold biopsy removal tender/painful anal canal, ? occult anal fissure- Not visualized  . COLONOSCOPY N/A 10/14/2015   Procedure: COLONOSCOPY;  Surgeon: Rogene Houston, MD;  [facility];  Service: Endoscopy;  Laterality: N/A;  2:50  . ESOPHAGOGASTRODUODENOSCOPY  05/10/10   benign mucosa with mild chronic inflammation.  Marland Kitchen FLEXIBLE SIGMOIDOSCOPY  12/29/2011   Procedure: FLEXIBLE SIGMOIDOSCOPY;  Surgeon: Rogene Houston, MD;  [facility]: [facility];  Service: Endoscopy;  Laterality: N/A;  200-Ann notified pt to be here @ 1:00  . NISSEN FUNDOPLICATION    . YAG LASER APPLICATION Left 08/21/7822   Procedure: YAG LASER APPLICATION;  Surgeon: Williams Che, MD;  Location: [facility];  Service: Ophthalmology;  Laterality: Left;    Family History  Problem Relation Age of Onset  . Diabetes Mother   . Alzheimer's disease Mother   . Heart  disease Father   . Heart disease Brother        Also mother, Father, brother and 2 sons  . Aneurysm Son   . Diabetes Son   . Hypertension Brother        also Sister x2  . Colon cancer Neg Hx     Social History   Tobacco Use  . Smoking status: Former Smoker    Packs/day: 1.00    Years: 55.00    Pack years: 55.00    Last attempt to quit: 09/15/2010    Years since quitting: 6.9  . Smokeless tobacco: Never Used  Substance Use Topics  . Alcohol use: No    Alcohol/week: 0.0 oz  . Drug use: No    Medications: I have reviewed the patient's  current medications. Allergies as of 09/06/2017      Reactions   Paroxetine Itching   Sulfa Antibiotics Other (See Comments)   Childhood reaction.   Venlafaxine Itching   Nalbuphine Rash      Medication List        Accurate as of 09/06/17  9:39 AM. Always use your most recent med list.          albuterol 108 (90 Base) MCG/ACT inhaler Commonly known as:  PROVENTIL HFA;VENTOLIN HFA Inhale 1-2 puffs into the lungs every 6 (six) hours as needed for wheezing or shortness of breath.   ALPRAZolam 0.5 MG tablet Commonly known as:  XANAX Take 0.5 mg by mouth 4 (four) times daily as needed for anxiety. For anxiety.   atorvastatin 20 MG tablet Commonly known as:  LIPITOR Take 20 mg every evening by mouth.   BENEFIBER Powd Take by mouth. Takes 1 tbsp once a day   ciprofloxacin 250 MG tablet Commonly known as:  CIPRO Take 1 tablet by mouth 2 (two) times daily.   ESTRACE VAGINAL 0.1 MG/GM vaginal cream Generic drug:  estradiol Apply 1 application topically once a week.   EYE VITAMINS PO Take 1 tablet by mouth daily.   fish oil-omega-3 fatty acids 1000 MG capsule Take 2 g by mouth at bedtime.   hydrochlorothiazide 25 MG tablet Commonly known as:  HYDRODIURIL Take 25 mg by mouth daily.   HYDROcodone-acetaminophen 10-325 MG tablet Commonly known as:  NORCO Take 0.5-2 tablets by mouth every 4 (four) hours as needed (1/2 tablet for mild pain, 1 tablet for moderate pain, 2 tablets for severe pain).   hydroxypropyl methylcellulose / hypromellose 2.5 % ophthalmic solution Commonly known as:  ISOPTO TEARS / GONIOVISC Place 1 drop into both eyes 3 (three) times daily as needed. For dry eyes.   levothyroxine 88 MCG tablet Commonly known as:  SYNTHROID, LEVOTHROID Take 88 mcg by mouth daily.   omeprazole 40 MG capsule Commonly known as:  PRILOSEC Take 40 mg by mouth daily.   OVER THE COUNTER MEDICATION Fleet suppository as needed   PREPARATION H EX Apply topically as  needed.   PROCTO-MED HC 2.5 % rectal cream Generic drug:  hydrocortisone Apply 1 application topically daily as needed for hemorrhoids or itching.   verapamil 180 MG CR tablet Commonly known as:  CALAN-SR Take 180 mg by mouth 2 (two) times daily.        ROS:  A comprehensive review of systems was negative except for: Respiratory: positive for SOB Gastrointestinal: positive for abdominal pain, nausea, vomiting and anal pain with defecation Genitourinary: positive for dysuria, frequency and retention Endocrine: positive for tired/ sluggish, too hot/ cold  Blood pressure Marland Kitchen)  160/85, pulse 66, temperature 98 F (36.7 C), temperature source Temporal, resp. rate 18, weight 161 lb (73 kg). Physical Exam  Constitutional: She is oriented to person, place, and time. She appears well-developed.  HENT:  Head: Normocephalic and atraumatic.  Eyes: Pupils are equal, round, and reactive to light.  Neck: Normal range of motion.  Cardiovascular: Normal rate and regular rhythm.  Pulmonary/Chest: Effort normal and breath sounds normal.  Abdominal: Soft. She exhibits no distension. There is no tenderness.  Genitourinary: Rectal exam shows fissure and tenderness. Pelvic exam was performed with patient in the knee-chest position.  Genitourinary Comments: Acute on chronic anal fissure with skin tag/ papilla at the origin, tender in the posterior anus only, slightly increased tone but not tight, minimal to no external hemorrhoids, no prolapse with valsalva  Musculoskeletal: Normal range of motion. She exhibits edema.  Neurological: She is alert and oriented to person, place, and time.  Skin: Skin is warm and dry.  Psychiatric: She has a normal mood and affect. Her behavior is normal. Judgment and thought content normal.  Vitals reviewed.   Results: None   Assessment & Plan:  [name] is a 82 y.o. female with am anal fissure with signs of acute tearing and chronicity with the tag posteriorly.   She has minimal external hemorrhoids and is tender in the posterior midline consistent with the fissure. She is chronically constipated, does not drink enough water and is not compliant with a daily [place], she did have a nitroglycerin cream prescribed by Dr. Gala Romney a few months back? Difficult to know if the patient used this or not; I have ordered Diltiazem 2%/ Lidocaine 5% compound QID; Celina will notify me if this is too expensive for the patient  -Stressed to patient that she actually needs to use the cream and comply -Also discussed Sitz baths and purchasing disposable Sitz bath if unable to get into the Tub. Information given to her.  -Also called in Cipro Rx for UTI as she is having dysuria and frequency  -Do not recommend any hemorrhoid surgery as she has no bleeding reported, and minimal external hemorrhoids. Her pain is from the fissure.  -Follow up 6-8 weeks to reassess if pain improved with cream and getting her bowls soft.  -Discussed water intake, fiber use, and stool softener use if needed    Virl Cagey 09/06/2017, 9:39 AM

## 2017-09-17 ENCOUNTER — Ambulatory Visit: Payer: Self-pay | Admitting: Gastroenterology

## 2017-09-17 DIAGNOSIS — N183 Chronic kidney disease, stage 3 (moderate): Secondary | ICD-10-CM | POA: Diagnosis not present

## 2017-09-17 DIAGNOSIS — F321 Major depressive disorder, single episode, moderate: Secondary | ICD-10-CM | POA: Diagnosis not present

## 2017-09-17 DIAGNOSIS — I120 Hypertensive chronic kidney disease with stage 5 chronic kidney disease or end stage renal disease: Secondary | ICD-10-CM | POA: Diagnosis not present

## 2017-09-17 DIAGNOSIS — I10 Essential (primary) hypertension: Secondary | ICD-10-CM | POA: Diagnosis not present

## 2017-09-19 DIAGNOSIS — Z029 Encounter for administrative examinations, unspecified: Secondary | ICD-10-CM

## 2017-10-16 ENCOUNTER — Ambulatory Visit: Payer: Medicare Other | Admitting: Nurse Practitioner

## 2017-10-18 ENCOUNTER — Other Ambulatory Visit (HOSPITAL_COMMUNITY): Payer: Self-pay | Admitting: Pulmonary Disease

## 2017-10-18 DIAGNOSIS — E079 Disorder of thyroid, unspecified: Secondary | ICD-10-CM

## 2017-10-23 ENCOUNTER — Ambulatory Visit (HOSPITAL_COMMUNITY)
Admission: RE | Admit: 2017-10-23 | Discharge: 2017-10-23 | Disposition: A | Discharge status: Home / Self Care | Payer: Medicare Other | Source: Ambulatory Visit | Attending: Pulmonary Disease | Admitting: Pulmonary Disease

## 2017-10-23 DIAGNOSIS — E039 Hypothyroidism, unspecified: Secondary | ICD-10-CM | POA: Diagnosis not present

## 2017-10-23 DIAGNOSIS — E079 Disorder of thyroid, unspecified: Secondary | ICD-10-CM | POA: Insufficient documentation

## 2017-10-25 DIAGNOSIS — J069 Acute upper respiratory infection, unspecified: Secondary | ICD-10-CM | POA: Diagnosis not present

## 2017-11-07 DIAGNOSIS — R070 Pain in throat: Secondary | ICD-10-CM | POA: Diagnosis not present

## 2017-11-15 ENCOUNTER — Other Ambulatory Visit (HOSPITAL_COMMUNITY): Payer: Self-pay | Admitting: Pulmonary Disease

## 2017-11-15 DIAGNOSIS — M7989 Other specified soft tissue disorders: Secondary | ICD-10-CM

## 2017-11-16 ENCOUNTER — Ambulatory Visit (HOSPITAL_COMMUNITY)
Admission: RE | Admit: 2017-11-16 | Discharge: 2017-11-16 | Disposition: A | Discharge status: Home / Self Care | Payer: Medicare Other | Source: Ambulatory Visit | Attending: Pulmonary Disease | Admitting: Pulmonary Disease

## 2017-11-16 DIAGNOSIS — M7121 Synovial cyst of popliteal space [Baker], right knee: Secondary | ICD-10-CM | POA: Diagnosis not present

## 2017-11-16 DIAGNOSIS — M7989 Other specified soft tissue disorders: Secondary | ICD-10-CM | POA: Diagnosis not present

## 2017-11-16 DIAGNOSIS — M79661 Pain in right lower leg: Secondary | ICD-10-CM | POA: Diagnosis not present

## 2017-11-27 ENCOUNTER — Ambulatory Visit: Payer: Medicare Other | Admitting: Gastroenterology

## 2017-11-27 ENCOUNTER — Encounter: Payer: Self-pay | Admitting: Internal Medicine

## 2017-11-27 ENCOUNTER — Telehealth: Payer: Self-pay

## 2017-11-27 NOTE — Telephone Encounter (Signed)
Vocie message was left yesterday 11/26/17. Pt is cancelling her appointment for today. Pt would like a new letter mailed to her with another appointment. Pt has another appointment today that she must attend.

## 2017-12-10 ENCOUNTER — Encounter: Payer: Self-pay | Admitting: Pulmonary Disease

## 2017-12-10 DIAGNOSIS — F322 Major depressive disorder, single episode, severe without psychotic features: Secondary | ICD-10-CM | POA: Diagnosis not present

## 2017-12-10 DIAGNOSIS — Z79891 Long term (current) use of opiate analgesic: Secondary | ICD-10-CM | POA: Diagnosis not present

## 2017-12-10 DIAGNOSIS — E039 Hypothyroidism, unspecified: Secondary | ICD-10-CM | POA: Diagnosis not present

## 2017-12-10 DIAGNOSIS — J449 Chronic obstructive pulmonary disease, unspecified: Secondary | ICD-10-CM | POA: Diagnosis not present

## 2017-12-10 DIAGNOSIS — N183 Chronic kidney disease, stage 3 (moderate): Secondary | ICD-10-CM | POA: Diagnosis not present

## 2017-12-19 DIAGNOSIS — D239 Other benign neoplasm of skin, unspecified: Secondary | ICD-10-CM | POA: Diagnosis not present

## 2017-12-19 DIAGNOSIS — L821 Other seborrheic keratosis: Secondary | ICD-10-CM | POA: Diagnosis not present

## 2017-12-19 DIAGNOSIS — L570 Actinic keratosis: Secondary | ICD-10-CM | POA: Diagnosis not present

## 2018-02-14 ENCOUNTER — Encounter: Payer: Self-pay | Admitting: Pulmonary Disease

## 2018-02-14 DIAGNOSIS — I129 Hypertensive chronic kidney disease with stage 1 through stage 4 chronic kidney disease, or unspecified chronic kidney disease: Secondary | ICD-10-CM | POA: Diagnosis not present

## 2018-02-14 DIAGNOSIS — F321 Major depressive disorder, single episode, moderate: Secondary | ICD-10-CM | POA: Diagnosis not present

## 2018-02-14 DIAGNOSIS — R102 Pelvic and perineal pain: Secondary | ICD-10-CM | POA: Diagnosis not present

## 2018-02-14 DIAGNOSIS — N390 Urinary tract infection, site not specified: Secondary | ICD-10-CM | POA: Diagnosis not present

## 2018-02-14 DIAGNOSIS — J449 Chronic obstructive pulmonary disease, unspecified: Secondary | ICD-10-CM | POA: Diagnosis not present

## 2018-02-18 DIAGNOSIS — L600 Ingrowing nail: Secondary | ICD-10-CM | POA: Diagnosis not present

## 2018-02-18 DIAGNOSIS — M79674 Pain in right toe(s): Secondary | ICD-10-CM | POA: Diagnosis not present

## 2018-03-08 ENCOUNTER — Ambulatory Visit: Payer: Medicare Other | Admitting: Gastroenterology

## 2018-04-01 ENCOUNTER — Other Ambulatory Visit (HOSPITAL_COMMUNITY): Payer: Self-pay | Admitting: Pulmonary Disease

## 2018-04-01 DIAGNOSIS — R102 Pelvic and perineal pain: Secondary | ICD-10-CM | POA: Diagnosis not present

## 2018-04-01 DIAGNOSIS — M1990 Unspecified osteoarthritis, unspecified site: Secondary | ICD-10-CM | POA: Diagnosis not present

## 2018-04-01 DIAGNOSIS — Z780 Asymptomatic menopausal state: Secondary | ICD-10-CM

## 2018-04-01 DIAGNOSIS — I129 Hypertensive chronic kidney disease with stage 1 through stage 4 chronic kidney disease, or unspecified chronic kidney disease: Secondary | ICD-10-CM | POA: Diagnosis not present

## 2018-04-01 DIAGNOSIS — F321 Major depressive disorder, single episode, moderate: Secondary | ICD-10-CM | POA: Diagnosis not present

## 2018-04-10 ENCOUNTER — Ambulatory Visit (HOSPITAL_COMMUNITY)
Admission: RE | Admit: 2018-04-10 | Discharge: 2018-04-10 | Disposition: A | Discharge status: Home / Self Care | Payer: Medicare Other | Source: Ambulatory Visit | Attending: Pulmonary Disease | Admitting: Pulmonary Disease

## 2018-04-10 DIAGNOSIS — M810 Age-related osteoporosis without current pathological fracture: Secondary | ICD-10-CM | POA: Diagnosis not present

## 2018-04-10 DIAGNOSIS — Z780 Asymptomatic menopausal state: Secondary | ICD-10-CM | POA: Insufficient documentation

## 2018-04-10 LAB — HM DEXA SCAN

## 2018-04-23 ENCOUNTER — Ambulatory Visit: Payer: Self-pay | Admitting: Urology

## 2018-05-24 ENCOUNTER — Ambulatory Visit: Payer: Medicare Other | Admitting: Gastroenterology

## 2018-05-27 NOTE — Progress Notes (Signed)
Referring Provider: Sinda Du, MD Primary Care Physician:  Sinda Du, MD Primary GI: Dr. Gala Romney   Chief Complaint  Patient presents with  . Abdominal Pain    right side  . Nausea    HPI:   [name] is an 83 y.o. female presenting today with a history of chronic constipation, presenting in follow-up. Previously on Linzess 290 mcg and titrated down to 145 mcg at last visit. Hemorrhoid banding X 3 in 2018. Last CT in April 2018 without contrast with moderate stool noted in right colon and proximal transverse colon. No acute findings.   Has been using the cream once per day. States she has something prolapse occasionally but not out now. Declining rectal exam.  Notes abdominal pain prior to BM. Tries to have a BM at least once per day. Taking Linzess only as needed. Had been taking dulcolax.     Past Medical History:  Diagnosis Date  . Arteriosclerotic cardiovascular disease (ASCVD) 2006   Nonobstructive; < 50% lesions on cath 2002; negative stress nuclear study in 10/2004  . Asthma   . Chronic pelvic pain in female   . Colitis due to Clostridium difficile [place]  . COPD (chronic obstructive pulmonary disease) ([place])   . Depression with anxiety   . Gastroesophageal reflux disease   . Hyperlipidemia   . Hypertension   . Hypothyroidism   . S/P colonoscopy 2007   few diverticula, otherwise nl  . S/P colonoscopy 12/13/10   rectal, cecal polyp, left-sided diverticulosis, hyperplastic. ?anal fissure? treated empirically  . S/P endoscopy 2009   linear esophageal erosions  . S/P endoscopy 05/10/10   Question island of salmon-colored epithelium in distal esophagus ; no Barrett's.   . Shingles   . Tobacco abuse, in remission    55-pack-year consumption; discontinued 08/2010    Past Surgical History:  Procedure Laterality Date  . ABDOMINAL HYSTERECTOMY    . BREAST LUMPECTOMY    . CATARACT EXTRACTION    . CHOLECYSTECTOMY  1962  . COLONOSCOPY  12/13/2010   Left-sided diverticulosis.  Cecal polyp, status post hot snare polypectomy/ Diminutive rectal polyp, status post cold biopsy removal tender/painful anal canal, ? occult anal fissure- Not visualized  . COLONOSCOPY N/A 10/14/2015   Diverticulosis  . ESOPHAGOGASTRODUODENOSCOPY  05/10/10   benign mucosa with mild chronic inflammation.  Marland Kitchen FLEXIBLE SIGMOIDOSCOPY  12/29/2011   Procedure: FLEXIBLE SIGMOIDOSCOPY;  Surgeon: Rogene Houston, MD;  [facility];  Service: Endoscopy;  Laterality: N/A;  200-Ann notified pt to be here @ 1:00  . NISSEN FUNDOPLICATION    . YAG LASER APPLICATION Left 06/23/5186   Procedure: YAG LASER APPLICATION;  Surgeon: Williams Che, MD;  Location: [facility];  Service: Ophthalmology;  Laterality: Left;    Current Outpatient Medications  Medication Sig Dispense Refill  . albuterol (PROVENTIL HFA;VENTOLIN HFA) 108 (90 Base) MCG/ACT inhaler Inhale 1-2 puffs into the lungs every 6 (six) hours as needed for wheezing or shortness of breath.    . ALPRAZolam (XANAX) 0.5 MG tablet Take 0.5 mg by mouth 4 (four) times daily as needed for anxiety. For anxiety.    Marland Kitchen atorvastatin (LIPITOR) 20 MG tablet Take 20 mg every evening by mouth.     Adora Fridge VAGINAL 0.1 MG/GM vaginal cream Apply 1 application topically once a week.   4  . fish oil-omega-3 fatty acids 1000 MG capsule Take 2 g by mouth at bedtime.     . hydrochlorothiazide (  HYDRODIURIL) 25 MG tablet Take 25 mg by mouth daily.    Marland Kitchen HYDROcodone-acetaminophen (NORCO) 10-325 MG tablet Take 0.5-2 tablets by mouth every 4 (four) hours as needed (1/2 tablet for mild pain, 1 tablet for moderate pain, 2 tablets for severe pain). 80 tablet 0  . hydroxypropyl methylcellulose (ISOPTO TEARS) 2.5 % ophthalmic solution Place 1 drop into both eyes 3 (three) times daily as needed. For dry eyes.    Marland Kitchen levothyroxine (SYNTHROID, LEVOTHROID) 88 MCG tablet Take 88 mcg by mouth daily.  11  . Multiple Vitamins-Minerals (EYE VITAMINS PO) Take 1  tablet by mouth daily.     Marland Kitchen omeprazole (PRILOSEC) 40 MG capsule Take 40 mg by mouth daily.  11  . OVER THE COUNTER MEDICATION Fleet suppository as needed    . PROCTO-MED HC 2.5 % rectal cream Apply 1 application topically daily as needed for hemorrhoids or itching.   1  . verapamil (CALAN-SR) 180 MG CR tablet Take 180 mg by mouth 2 (two) times daily.   4  . Wheat Dextrin (BENEFIBER) POWD Take by mouth. Takes 1 tbsp once a day    . Witch Hazel (PREPARATION H EX) Apply topically as needed.    . linaclotide (LINZESS) 145 MCG CAPS capsule Take 1 capsule (145 mcg total) by mouth daily before breakfast. Before breakfast 90 capsule 3   No current facility-administered medications for this visit.     Allergies as of 05/28/2018 - Review Complete 05/28/2018  Allergen Reaction Noted  . Paroxetine Itching   . Sulfa antibiotics Other (See Comments) 12/13/2011  . Venlafaxine Itching   . Nalbuphine Rash     Family History  Problem Relation Age of Onset  . Diabetes Mother   . Alzheimer's disease Mother   . Heart disease Father   . Heart disease Brother        Also mother, Father, brother and 2 sons  . Aneurysm Son   . Diabetes Son   . Hypertension Brother        also Sister x2  . Colon cancer Neg Hx     Social History   Socioeconomic History  . Marital status: Married    Spouse name: Not on file  . Number of children: 4  . Years of education: Not on file  . Highest education level: Not on file  Occupational History  . Not on file  Social Needs  . Financial resource strain: Not on file  . Food insecurity:    Worry: Not on file    Inability: Not on file  . Transportation needs:    Medical: Not on file    Non-medical: Not on file  Tobacco Use  . Smoking status: Former Smoker    Packs/day: 1.00    Years: 55.00    Pack years: 55.00    Last attempt to quit: 09/15/2010    Years since quitting: 7.7  . Smokeless tobacco: Never Used  Substance and Sexual Activity  . Alcohol use: No     Alcohol/week: 0.0 standard drinks  . Drug use: No  . Sexual activity: Not Currently  Lifestyle  . Physical activity:    Days per week: Not on file    Minutes per session: Not on file  . Stress: Not on file  Relationships  . Social connections:    Talks on phone: Not on file    Gets together: Not on file    Attends religious service: Not on file    Active member of club  or organization: Not on file    Attends meetings of clubs or organizations: Not on file    Relationship status: Not on file  Other Topics Concern  . Not on file  Social History Narrative  . Not on file    Review of Systems: Gen: Denies fever, chills, anorexia. Denies fatigue, weakness, weight loss.  CV: Denies chest pain, palpitations, syncope, peripheral edema, and claudication. Resp: Denies dyspnea at rest, cough, wheezing, coughing up blood, and pleurisy. GI: see HPI Derm: Denies rash, itching, dry skin Psych: Denies depression, anxiety, memory loss, confusion. No homicidal or suicidal ideation.  Heme: Denies bruising, bleeding, and enlarged lymph nodes.  Physical Exam: BP (!) 170/90 (BP Location: Left Arm, Cuff Size: Normal)   Pulse 62   Temp (!) 97 F (36.1 C) (Oral)   Ht 5' (1.524 m)   Wt 147 lb (66.7 kg)   BMI 28.71 kg/m  General:   Alert and oriented. No distress noted. Pleasant and cooperative.  Head:  Normocephalic and atraumatic. Eyes:  Conjuctiva clear without scleral icterus. Mouth:  Oral mucosa pink and moist.  Abdomen:  +BS, soft, non-tender and non-distended. Limited exam with patient sitting in chair.  Msk:  With kyphosis  Extremities:  Without edema. Neurologic:  Alert and  oriented x4 Psych:  Alert and cooperative. Normal mood and affect.

## 2018-05-28 ENCOUNTER — Encounter: Payer: Self-pay | Admitting: Gastroenterology

## 2018-05-28 ENCOUNTER — Ambulatory Visit: Payer: Medicare Other | Admitting: Gastroenterology

## 2018-05-28 VITALS — BP 170/90 | HR 62 | Temp 97.0°F | Ht 60.0 in | Wt 147.0 lb

## 2018-05-28 DIAGNOSIS — K5900 Constipation, unspecified: Secondary | ICD-10-CM

## 2018-05-28 MED ORDER — LINACLOTIDE 145 MCG PO CAPS: 145.0000 ug | ORAL_CAPSULE | Freq: Every day | ORAL | 3 refills | Status: DC

## 2018-05-28 NOTE — Patient Instructions (Signed)
Please take Linzess every day, 30 minutes before breakfast. This works best when taken daily. You may have some loose stool for a few days, but it should get better. If not, please call us. I have also sent it to your pharmacy.   I will see you in 4 months!  Please call Dr. Luan Pulling regarding your blood pressure and skin concerns.  It was a pleasure to see you today. I strive to create trusting relationships with patients to provide genuine, compassionate, and quality care. I value your feedback. If you receive a survey regarding your visit,  I greatly appreciate you taking time to fill this out.   Annitta Needs, PhD, ANP-BC [facility]

## 2018-05-28 NOTE — Assessment & Plan Note (Signed)
Not ideally managed. Linzess 145 mcg with good results in past but not taking every day as prescribed. Discussed this will work best taking daily. Declined rectal exam today and description sounds most consistent with prolapsing hemorrhoids. I have asked her to call with the cream that she has used at home. Return in 4 months, Linzess 145 mcg sent to pharmacy, call if no improvement in symptoms.

## 2018-05-29 NOTE — Progress Notes (Signed)
cc'd to pcp 

## 2018-06-04 ENCOUNTER — Encounter: Payer: Self-pay | Admitting: Pulmonary Disease

## 2018-06-04 DIAGNOSIS — N390 Urinary tract infection, site not specified: Secondary | ICD-10-CM | POA: Diagnosis not present

## 2018-06-04 DIAGNOSIS — F321 Major depressive disorder, single episode, moderate: Secondary | ICD-10-CM | POA: Diagnosis not present

## 2018-06-04 DIAGNOSIS — R21 Rash and other nonspecific skin eruption: Secondary | ICD-10-CM | POA: Diagnosis not present

## 2018-06-04 DIAGNOSIS — J441 Chronic obstructive pulmonary disease with (acute) exacerbation: Secondary | ICD-10-CM | POA: Diagnosis not present

## 2018-06-04 DIAGNOSIS — I119 Hypertensive heart disease without heart failure: Secondary | ICD-10-CM | POA: Diagnosis not present

## 2018-09-02 DIAGNOSIS — F321 Major depressive disorder, single episode, moderate: Secondary | ICD-10-CM | POA: Diagnosis not present

## 2018-09-02 DIAGNOSIS — I10 Essential (primary) hypertension: Secondary | ICD-10-CM | POA: Diagnosis not present

## 2018-09-02 DIAGNOSIS — R1011 Right upper quadrant pain: Secondary | ICD-10-CM | POA: Diagnosis not present

## 2018-09-02 DIAGNOSIS — F419 Anxiety disorder, unspecified: Secondary | ICD-10-CM | POA: Diagnosis not present

## 2018-09-16 ENCOUNTER — Other Ambulatory Visit: Payer: Self-pay | Admitting: Pulmonary Disease

## 2018-09-16 ENCOUNTER — Other Ambulatory Visit (HOSPITAL_COMMUNITY): Payer: Self-pay | Admitting: Pulmonary Disease

## 2018-09-16 DIAGNOSIS — R1011 Right upper quadrant pain: Secondary | ICD-10-CM

## 2018-09-17 ENCOUNTER — Encounter: Payer: Self-pay | Admitting: Pulmonary Disease

## 2018-09-17 DIAGNOSIS — E079 Disorder of thyroid, unspecified: Secondary | ICD-10-CM | POA: Diagnosis not present

## 2018-09-17 DIAGNOSIS — E039 Hypothyroidism, unspecified: Secondary | ICD-10-CM | POA: Diagnosis not present

## 2018-09-17 DIAGNOSIS — E785 Hyperlipidemia, unspecified: Secondary | ICD-10-CM | POA: Diagnosis not present

## 2018-09-17 DIAGNOSIS — F329 Major depressive disorder, single episode, unspecified: Secondary | ICD-10-CM | POA: Diagnosis not present

## 2018-09-17 LAB — BASIC METABOLIC PANEL
BUN: 26 — AB (ref 4–21)
Creatinine: 1.6 — AB (ref <=1.1)
Glucose: 96
Sodium: 142 (ref 137–147)

## 2018-09-17 LAB — LIPID PANEL
Cholesterol: 164 (ref 0–200)
HDL: 44 (ref 35–70)
LDL Cholesterol: 95
Triglycerides: 145 (ref 40–160)

## 2018-09-17 LAB — CBC AND DIFFERENTIAL: WBC: 6.8

## 2018-09-17 LAB — TSH: TSH: 0.99 (ref <=5.90)

## 2018-10-01 ENCOUNTER — Ambulatory Visit: Payer: Medicare Other | Admitting: Gastroenterology

## 2018-10-03 ENCOUNTER — Ambulatory Visit (HOSPITAL_COMMUNITY)
Admission: RE | Admit: 2018-10-03 | Discharge: 2018-10-03 | Disposition: A | Discharge status: Home / Self Care | Payer: Medicare Other | Source: Ambulatory Visit | Attending: Pulmonary Disease | Admitting: Pulmonary Disease

## 2018-10-03 ENCOUNTER — Other Ambulatory Visit: Payer: Self-pay

## 2018-10-03 ENCOUNTER — Other Ambulatory Visit (HOSPITAL_COMMUNITY): Payer: Self-pay | Admitting: Pulmonary Disease

## 2018-10-03 DIAGNOSIS — R1011 Right upper quadrant pain: Secondary | ICD-10-CM | POA: Diagnosis not present

## 2018-10-03 DIAGNOSIS — K5730 Diverticulosis of large intestine without perforation or abscess without bleeding: Secondary | ICD-10-CM | POA: Diagnosis not present

## 2018-10-03 LAB — POCT I-STAT CREATININE: Creatinine, Ser: 2 mg/dL — ABNORMAL HIGH (ref 0.44–1.00)

## 2018-10-06 ENCOUNTER — Emergency Department (HOSPITAL_COMMUNITY): Payer: Medicare Other

## 2018-10-06 ENCOUNTER — Encounter (HOSPITAL_COMMUNITY): Payer: Self-pay | Admitting: *Deleted

## 2018-10-06 ENCOUNTER — Other Ambulatory Visit: Payer: Self-pay

## 2018-10-06 ENCOUNTER — Emergency Department (HOSPITAL_COMMUNITY)
Admission: EM | Admit: 2018-10-06 | Discharge: 2018-10-06 | Disposition: A | Discharge status: Home / Self Care | Payer: Medicare Other | Attending: Emergency Medicine | Admitting: Emergency Medicine

## 2018-10-06 DIAGNOSIS — E785 Hyperlipidemia, unspecified: Secondary | ICD-10-CM | POA: Diagnosis not present

## 2018-10-06 DIAGNOSIS — J449 Chronic obstructive pulmonary disease, unspecified: Secondary | ICD-10-CM | POA: Insufficient documentation

## 2018-10-06 DIAGNOSIS — R0602 Shortness of breath: Secondary | ICD-10-CM | POA: Diagnosis not present

## 2018-10-06 DIAGNOSIS — Z87891 Personal history of nicotine dependence: Secondary | ICD-10-CM | POA: Diagnosis not present

## 2018-10-06 DIAGNOSIS — Z7952 Long term (current) use of systemic steroids: Secondary | ICD-10-CM | POA: Insufficient documentation

## 2018-10-06 DIAGNOSIS — Z79899 Other long term (current) drug therapy: Secondary | ICD-10-CM | POA: Insufficient documentation

## 2018-10-06 DIAGNOSIS — R0789 Other chest pain: Secondary | ICD-10-CM

## 2018-10-06 DIAGNOSIS — I2510 Atherosclerotic heart disease of native coronary artery without angina pectoris: Secondary | ICD-10-CM | POA: Insufficient documentation

## 2018-10-06 DIAGNOSIS — R05 Cough: Secondary | ICD-10-CM | POA: Diagnosis not present

## 2018-10-06 DIAGNOSIS — R300 Dysuria: Secondary | ICD-10-CM | POA: Insufficient documentation

## 2018-10-06 DIAGNOSIS — R1011 Right upper quadrant pain: Secondary | ICD-10-CM | POA: Diagnosis not present

## 2018-10-06 DIAGNOSIS — I10 Essential (primary) hypertension: Secondary | ICD-10-CM | POA: Diagnosis not present

## 2018-10-06 DIAGNOSIS — E039 Hypothyroidism, unspecified: Secondary | ICD-10-CM | POA: Insufficient documentation

## 2018-10-06 DIAGNOSIS — R110 Nausea: Secondary | ICD-10-CM | POA: Insufficient documentation

## 2018-10-06 LAB — URINALYSIS, ROUTINE W REFLEX MICROSCOPIC
Bilirubin Urine: NEGATIVE
Glucose, UA: NEGATIVE mg/dL
Ketones, ur: NEGATIVE mg/dL
Nitrite: NEGATIVE
Protein, ur: 100 mg/dL — AB
Specific Gravity, Urine: 1.015 (ref 1.005–1.030)
WBC, UA: >50 WBC/hpf — ABNORMAL HIGH (ref 0–5)
pH: 5 (ref 5.0–8.0)

## 2018-10-06 LAB — CBC WITH DIFFERENTIAL/PLATELET
Abs Immature Granulocytes: 0.01 10*3/uL (ref 0.00–0.07)
Basophils Absolute: 0 10*3/uL (ref 0.0–0.1)
Basophils Relative: 1 %
Eosinophils Absolute: 0.4 10*3/uL (ref 0.0–0.5)
Eosinophils Relative: 6 %
HCT: 36.1 % (ref 36.0–46.0)
Hemoglobin: 12 g/dL (ref 12.0–15.0)
Immature Granulocytes: 0 %
Lymphocytes Relative: 31 %
Lymphs Abs: 2 10*3/uL (ref 0.7–4.0)
MCH: 29.1 pg (ref 26.0–34.0)
MCHC: 33.2 g/dL (ref 30.0–36.0)
MCV: 87.6 fL (ref 80.0–100.0)
Monocytes Absolute: 0.6 10*3/uL (ref 0.1–1.0)
Monocytes Relative: 9 %
Neutro Abs: 3.5 10*3/uL (ref 1.7–7.7)
Neutrophils Relative %: 53 %
Platelets: 295 10*3/uL (ref 150–400)
RBC: 4.12 MIL/uL (ref 3.87–5.11)
RDW: 13.4 % (ref 11.5–15.5)
WBC: 6.5 10*3/uL (ref 4.0–10.5)
nRBC: 0 % (ref 0.0–0.2)

## 2018-10-06 LAB — COMPREHENSIVE METABOLIC PANEL
ALT: 14 U/L (ref 0–44)
AST: 14 U/L — ABNORMAL LOW (ref 15–41)
Albumin: 3.8 g/dL (ref 3.5–5.0)
Alkaline Phosphatase: 80 U/L (ref 38–126)
Anion gap: 11 (ref 5–15)
BUN: 25 mg/dL — ABNORMAL HIGH (ref 8–23)
CO2: 22 mmol/L (ref 22–32)
Calcium: 9.3 mg/dL (ref 8.9–10.3)
Chloride: 105 mmol/L (ref 98–111)
Creatinine, Ser: 1.7 mg/dL — ABNORMAL HIGH (ref 0.44–1.00)
GFR calc Af Amer: 32 mL/min — ABNORMAL LOW (ref >60)
GFR calc non Af Amer: 28 mL/min — ABNORMAL LOW (ref >60)
Glucose, Bld: 106 mg/dL — ABNORMAL HIGH (ref 70–99)
Potassium: 3.2 mmol/L — ABNORMAL LOW (ref 3.5–5.1)
Sodium: 138 mmol/L (ref 135–145)
Total Bilirubin: 0.3 mg/dL (ref 0.3–1.2)
Total Protein: 7.6 g/dL (ref 6.5–8.1)

## 2018-10-06 LAB — LIPASE, BLOOD: Lipase: 24 U/L (ref 11–51)

## 2018-10-06 LAB — TROPONIN I (HIGH SENSITIVITY)
Troponin I (High Sensitivity): 4 ng/L (ref <18)
Troponin I (High Sensitivity): 4 ng/L (ref <18)

## 2018-10-06 LAB — BRAIN NATRIURETIC PEPTIDE: B Natriuretic Peptide: 107 pg/mL — ABNORMAL HIGH (ref 0.0–100.0)

## 2018-10-06 MED ORDER — PREDNISONE 10 MG PO TABS: 40.0000 mg | ORAL_TABLET | Freq: Every day | ORAL | 0 refills | Status: DC

## 2018-10-06 MED ORDER — ALBUTEROL SULFATE HFA 108 (90 BASE) MCG/ACT IN AERS
2.0000 | INHALATION_SPRAY | Freq: Once | RESPIRATORY_TRACT | Status: AC
  Administered 2018-10-06: 2 via RESPIRATORY_TRACT
  Filled 2018-10-06: qty 6.7

## 2018-10-06 MED ORDER — ONDANSETRON HCL 4 MG/2ML IJ SOLN
4.0000 mg | Freq: Once | INTRAMUSCULAR | Status: AC
  Administered 2018-10-06: 4 mg via INTRAVENOUS
  Filled 2018-10-06: qty 2

## 2018-10-06 MED ORDER — PREDNISONE 20 MG PO TABS
40.0000 mg | ORAL_TABLET | Freq: Once | ORAL | Status: AC
  Administered 2018-10-06: 15:00:00 40 mg via ORAL
  Filled 2018-10-06: qty 2

## 2018-10-06 NOTE — ED Triage Notes (Signed)
Pt had SOB for several days, but worsening today. Nausea started last week. CT abdomen was done Friday due nausea.

## 2018-10-06 NOTE — ED Notes (Signed)
Lab called stating either the urine or the culture could be ran, but not both as sample was short. Informed to only run urine at this time.

## 2018-10-06 NOTE — Discharge Instructions (Addendum)
You have been seen today for shortness of breath and chest tightness. Please read and follow all provided instructions.   1. Medications: prednisone (steroid), albuterol (inhaler for shortness of breath), continue taking antibiotic as previously prescribed, usual home medications 2. Treatment: rest, drink plenty of fluids 3. Follow Up: Please follow up with your primary doctor in 2 days for discussion of your diagnoses and further evaluation after today's visit; if you do not have a primary care doctor use the resource guide provided to find one; Please return to the ER for any new or worsening symptoms. Please obtain all of your results from medical records or have your doctors office obtain the results - share them with your doctor - you should be seen at your doctors office. Call today to arrange your follow up.   Take medications as prescribed. Please review all of the medicines and only take them if you do not have an allergy to them. Return to the emergency room for worsening condition or new concerning symptoms. Follow up with your regular doctor. If you don't have a regular doctor use one of the numbers below to establish a primary care doctor.  Please be aware that if you are taking birth control pills, taking other prescriptions, ESPECIALLY ANTIBIOTICS may make the birth control ineffective - if this is the case, either do not engage in sexual activity or use alternative methods of birth control such as condoms until you have finished the medicine and your family doctor says it is OK to restart them. If you are on a blood thinner such as COUMADIN, be aware that any other medicine that you take may cause the coumadin to either work too much, or not enough - you should have your coumadin level rechecked in next 7 days if this is the case.  ?  It is also a possibility that you have an allergic reaction to any of the medicines that you have been prescribed - Everybody reacts differently to  medications and while MOST people have no trouble with most medicines, you may have a reaction such as nausea, vomiting, rash, swelling, shortness of breath. If this is the case, please stop taking the medicine immediately and contact your physician.  ?  You should return to the ER if you develop severe or worsening symptoms.   Emergency Department Resource Guide 1) Find a Doctor and Pay Out of Pocket Although you won't have to find out who is covered by your insurance plan, it is a good idea to ask around and get recommendations. You will then need to call the office and see if the doctor you have chosen will accept you as a new patient and what types of options they offer for patients who are self-pay. Some doctors offer discounts or will set up payment plans for their patients who do not have insurance, but you will need to ask so you aren't surprised when you get to your appointment.  2) Contact Your Local Health Department Not all health departments have doctors that can see patients for sick visits, but many do, so it is worth a call to see if yours does. If you don't know where your local health department is, you can check in your phone book. The CDC also has a tool to help you locate your state's health department, and many state websites also have listings of all of their local health departments.  3) Find a [facility] If your illness is not likely to be very  severe or complicated, you may want to try a walk in clinic. These are popping up all over the country in pharmacies, drugstores, and shopping centers. They're usually staffed by nurse practitioners or physician assistants that have been trained to treat common illnesses and complaints. They're usually fairly quick and inexpensive. However, if you have serious medical issues or chronic medical problems, these are probably not your best option.  No Primary Care Doctor: Call Health Connect at  [phone] - they can help you locate a  primary care doctor that  accepts your insurance, provides certain services, etc. Physician Referral Service- [phone]  Chronic Pain Problems: Organization         Address  Phone   Notes  [facility]  [phone] Patients need to be referred by their primary care doctor.   Medication Assistance: Organization         Address  Phone   Notes  [facility] Medication [facility] [address] [place], [place] [address] [phone] --Must be a resident of [facility] -- Must have NO insurance coverage whatsoever (no Medicaid/ Medicare, etc.) -- The pt. MUST have a primary care doctor that directs their care regularly and follows them in the community   [facility]  [phone]   [employer]  [phone]    Agencies that provide inexpensive medical care: Organization         Address  Phone   Notes  [place]  [phone]   [facility]    [phone]   [facility] [place], [place] [address] [phone]   [place] [address] [place] [phone]   [facility]    [phone]   [facility]    [phone]   [place] and [place] [address], [place] Phone:  [phone], Fax:  [phone] Hours of Operation:  9 am - 6 pm, M-F.  Also accepts Medicaid/Medicare and self-pay.  [facility] for [address], [address], [place] Phone: [phone], Fax: [phone]. Hours of Operation:  8:30 am - 5:30 pm, M-F.  Also accepts Medicaid and self-pay.  [facility] [place] [address] [place] Phone: [phone]   [place], [place], [place] [phone], Ext. [phone] Mondays & Thursdays: 7-9 AM.  First 15 patients are seen on a first come, first serve basis.    [place]  Providers:  Organization         Address  Phone   Notes  [facility] [address], [place] [phone] Also accepts self-pay patients.  [facility] [address], [place]  [phone]   [place], [address], [place] [phone]   [facility] [address] [place] [phone]   Lucianne Lei [address], [place]   [phone] Only accepts [place] [facility] [place] patients after they have their name applied to their card.   Self-Pay (no insurance) in [facility]:  Organization         Address  Phone   Notes  Sickle Cell Patients, [facility] [place] [phone]   [facility] [address]  [address], [place] [phone]   [facility] [place]  [place], [place], [place] [phone]   [facility]/Dr. Osei-Bonsu  [address] [place] or [address], [place] [phone] Phone number for both [place] and [place] locations is the same.  [facility] and [facility] [address], [place] [phone]   [facility] [address], [place] or [address] [phone]   [facility] [address], [place] [phone], phone; [phone], fax Sees patients 1st and 3rd Saturday of every month.  Must not qualify for public or private insurance (i.e. Medicaid, Medicare, [place] Health Choice, Veterans' Benefits)  Household income should be no more than 200% of the poverty level The clinic cannot treat you if you are pregnant or think you are pregnant  Sexually transmitted diseases are not treated at the clinic.
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
## 2018-10-08 LAB — URINE CULTURE: Culture: >=100000 — AB

## 2018-10-09 ENCOUNTER — Telehealth: Payer: Self-pay | Admitting: *Deleted

## 2018-10-09 NOTE — Telephone Encounter (Signed)
Post ED Visit - Positive Culture Follow-up  Culture report reviewed by antimicrobial stewardship pharmacist: [place] Team []  Elenor Quinones, Pharm.D. []  Heide Guile, Pharm.D., BCPS AQ-ID []  Parks Neptune, Pharm.D., BCPS []  Alycia Rossetti, Pharm.D., BCPS []  Wilder, Pharm.D., BCPS, AAHIVP []  Legrand Como, Pharm.D., BCPS, AAHIVP []  Salome Arnt, PharmD, BCPS []  Johnnette Gourd, PharmD, BCPS []  Hughes Better, PharmD, BCPS []  Leeroy Cha, PharmD []  Laqueta Linden, PharmD, BCPS []  Albertina Parr, PharmD  [place] Team []  Leodis Sias, PharmD []  Lindell Spar, PharmD []  Royetta Asal, PharmD []  Graylin Shiver, Rph []  Rema Fendt) Glennon Mac, PharmD []  Arlyn Dunning, PharmD []  Netta Cedars, PharmD []  Dia Sitter, PharmD []  Leone Haven, PharmD []  Gretta Arab, PharmD []  Theodis Shove, PharmD []  Peggyann Juba, PharmD []  Reuel Boom, PharmD   Positive urine culture Treated with Doxycycline Hyclate, pt reports no further UTI symptoms and no further patient follow-up is required at this time.  Em[place], [facility] 10/09/2018, 12:05 PM

## 2018-10-09 NOTE — Progress Notes (Signed)
ED Antimicrobial Stewardship Positive Culture Follow Up   [name] is an 83 y.o. female who presented to [facility] on 10/06/2018 with a chief complaint of  Chief Complaint  Patient presents with  . Shortness of Breath   Presented with SOB, nausea, and intermittent dysuria. UA showing negative ketones, large leukocytes, few bacteria, WBC>50, and 6-10 squamous cells. Was on doxycycline PTA for 2 days - ED MD said to continue and complete course.  Recent Results (from the past 720 hour(s))  Urine culture     Status: Abnormal   Collection Time: 10/06/18 12:07 PM   Specimen: Urine, Clean Catch  Result Value Ref Range Status   Specimen Description   Final    URINE, CLEAN CATCH Performed at [facility], [address] [place], [place] [address]    Special Requests   Final    NONE Performed at [facility], [address] [place], [place] [address]    Culture >=100,000 COLONIES/mL ESCHERICHIA COLI (A)  Final   Report Status 10/08/2018 FINAL  Final   Organism ID, Bacteria ESCHERICHIA COLI (A)  Final      Susceptibility   Escherichia coli - MIC*    AMPICILLIN 4 SENSITIVE Sensitive     CEFAZOLIN <=4 SENSITIVE Sensitive     CEFTRIAXONE <=1 SENSITIVE Sensitive     CIPROFLOXACIN <=0.25 SENSITIVE Sensitive     GENTAMICIN <=1 SENSITIVE Sensitive     IMIPENEM <=0.25 SENSITIVE Sensitive     NITROFURANTOIN <=16 SENSITIVE Sensitive     TRIMETH/SULFA <=20 SENSITIVE Sensitive     AMPICILLIN/SULBACTAM <=2 SENSITIVE Sensitive     PIP/TAZO <=4 SENSITIVE Sensitive     Extended ESBL NEGATIVE Sensitive     * >=100,000 COLONIES/mL ESCHERICHIA COLI    [x]  Treated with E Coli, organism resistant to prescribed antimicrobial []  Patient discharged originally without antimicrobial agent and treatment is now indicated  New antibiotic prescription: If still having symptoms, can order cephalexin 250 mg every 8 hours for 5 days.   ED Provider: Benedetto Goad, PA-C  Antonietta Jewel, PharmD,  BCCCP Clinical Pharmacist  Pager: [phone] Phone: [phone] 10/09/2018, 9:01 AM Clinical Pharmacist Monday - Friday phone -  [phone] Saturday - Sunday phone - [phone]

## 2018-10-16 ENCOUNTER — Encounter: Payer: Self-pay | Admitting: Pulmonary Disease

## 2018-10-16 DIAGNOSIS — J449 Chronic obstructive pulmonary disease, unspecified: Secondary | ICD-10-CM | POA: Diagnosis not present

## 2018-10-16 DIAGNOSIS — F419 Anxiety disorder, unspecified: Secondary | ICD-10-CM | POA: Diagnosis not present

## 2018-10-16 DIAGNOSIS — N390 Urinary tract infection, site not specified: Secondary | ICD-10-CM | POA: Diagnosis not present

## 2018-10-16 DIAGNOSIS — F321 Major depressive disorder, single episode, moderate: Secondary | ICD-10-CM | POA: Diagnosis not present

## 2018-10-16 DIAGNOSIS — I10 Essential (primary) hypertension: Secondary | ICD-10-CM | POA: Diagnosis not present

## 2018-11-14 ENCOUNTER — Encounter: Payer: Self-pay | Admitting: Gastroenterology

## 2018-11-14 ENCOUNTER — Ambulatory Visit: Payer: Medicare Other | Admitting: Gastroenterology

## 2018-11-14 ENCOUNTER — Other Ambulatory Visit: Payer: Self-pay

## 2018-11-14 DIAGNOSIS — R1013 Epigastric pain: Secondary | ICD-10-CM

## 2018-11-14 DIAGNOSIS — R634 Abnormal weight loss: Secondary | ICD-10-CM

## 2018-11-14 MED ORDER — ONDANSETRON HCL 4 MG PO TABS: 4.0000 mg | ORAL_TABLET | Freq: Three times a day (TID) | ORAL | 1 refills | Status: DC

## 2018-11-14 NOTE — Patient Instructions (Addendum)
I want you to take Prilosec twice a day every day, 30 minutes before breakfast and dinner. It is best to take it on an empty stomach.   You can take Pepcid as needed at bedtime.  Take Zofran 30 minutes before each meal and at bedtime.  We are arranging an upper endoscopy with Dr. Gala Romney.  If you have severe pain, worsening nausea, unable to eat or drink, go directly to the emergency room.  I enjoyed seeing you again today! As you know, I value our relationship and want to provide genuine, compassionate, and quality care. I welcome your feedback. If you receive a survey regarding your visit,  I greatly appreciate you taking time to fill this out. See you next time!  Annitta Needs, PhD, ANP-BC [facility]  t

## 2018-11-14 NOTE — Progress Notes (Signed)
Referring Provider: Sinda Du, MD Primary Care Physician:  Sinda Du, MD  Primary GI: Dr. Gala Romney   Chief Complaint  Patient presents with  . Nausea    daily all the time. unable to vomit  . Constipation    linzess was going to cost $400    HPI:   [name] is an 83 y.o. female presenting today with a history of chronic constipation. Historically on Linzess. Unable to afford as over 400$.  Nausea starting since last visit in March 2020. Nausea daily all day long. Tries to eat. Nausea worse when eating. No vomiting. Discomfort in epigastric area that is more like a "sickness". Discomfort from navel radiating up to RUQ. +GERD. CT abd/pelvis without contrast: no acute findings, moderate amount of stool. CBC normal. CMP with normal LFTs. Lipase normal.   Taking OTC agent to help with BMs. Probiotic. BMs daily. Has lost weight, approximately 6 lbs since last visit in March. Gallbladder absent. Prilosec 40 mg daily to BID.   Past Medical History:  Diagnosis Date  . Arteriosclerotic cardiovascular disease (ASCVD) 2006   Nonobstructive; < 50% lesions on cath 2002; negative stress nuclear study in 10/2004  . Asthma   . Chronic pelvic pain in female   . Colitis due to Clostridium difficile [place]  . COPD (chronic obstructive pulmonary disease) ([place])   . Depression with anxiety   . Gastroesophageal reflux disease   . Hyperlipidemia   . Hypertension   . Hypothyroidism   . S/P colonoscopy 2007   few diverticula, otherwise nl  . S/P colonoscopy 12/13/10   rectal, cecal polyp, left-sided diverticulosis, hyperplastic. ?anal fissure? treated empirically  . S/P endoscopy 2009   linear esophageal erosions  . S/P endoscopy 05/10/10   Question island of salmon-colored epithelium in distal esophagus ; no Barrett's.   . Shingles   . Tobacco abuse, in remission    55-pack-year consumption; discontinued 08/2010    Past Surgical History:  Procedure Laterality Date  .  ABDOMINAL HYSTERECTOMY    . BREAST LUMPECTOMY    . CATARACT EXTRACTION    . CHOLECYSTECTOMY  1962  . COLONOSCOPY  12/13/2010   Left-sided diverticulosis.  Cecal polyp, status post hot snare polypectomy/ Diminutive rectal polyp, status post cold biopsy removal tender/painful anal canal, ? occult anal fissure- Not visualized  . COLONOSCOPY N/A 10/14/2015   Diverticulosis  . ESOPHAGOGASTRODUODENOSCOPY  05/10/10   benign mucosa with mild chronic inflammation.  Marland Kitchen FLEXIBLE SIGMOIDOSCOPY  12/29/2011   Procedure: FLEXIBLE SIGMOIDOSCOPY;  Surgeon: Rogene Houston, MD;  Location: [facility];  Service: Endoscopy;  Laterality: N/A;  200-Ann notified pt to be here @ 1:00  . NISSEN FUNDOPLICATION    . YAG LASER APPLICATION Left 5/99/3570   Procedure: YAG LASER APPLICATION;  Surgeon: Williams Che, MD;  Location: [facility] ORS;  Service: Ophthalmology;  Laterality: Left;    Current Outpatient Medications  Medication Sig Dispense Refill  . albuterol (PROVENTIL HFA;VENTOLIN HFA) 108 (90 Base) MCG/ACT inhaler Inhale 1-2 puffs into the lungs every 6 (six) hours as needed for wheezing or shortness of breath.    Marland Kitchen alum & mag hydroxide-simeth (MAALOX/MYLANTA) 200-200-20 MG/5ML suspension Take by mouth every 6 (six) hours as needed for indigestion or heartburn.    Marland Kitchen atorvastatin (LIPITOR) 20 MG tablet Take 20 mg every evening by mouth.     Adora Fridge VAGINAL 0.1 MG/GM vaginal cream Apply 1 application topically once a week.   4  . hydrochlorothiazide (  HYDRODIURIL) 25 MG tablet Take 25 mg by mouth daily.    Marland Kitchen HYDROcodone-acetaminophen (NORCO) 10-325 MG tablet Take 0.5-2 tablets by mouth every 4 (four) hours as needed (1/2 tablet for mild pain, 1 tablet for moderate pain, 2 tablets for severe pain). 80 tablet 0  . hydroxypropyl methylcellulose (ISOPTO TEARS) 2.5 % ophthalmic solution Place 1 drop into both eyes 3 (three) times daily as needed. For dry eyes.    Marland Kitchen levothyroxine (SYNTHROID) 75 MCG tablet Take 1 tablet by  mouth daily.    . Multiple Vitamins-Minerals (EYE VITAMINS PO) Take 1 tablet by mouth daily.     Marland Kitchen omeprazole (PRILOSEC) 40 MG capsule Take 40 mg by mouth 2 (two) times daily before a meal.    . OVER THE COUNTER MEDICATION [place] suppository as needed    . potassium chloride SA (K-DUR) 20 MEQ tablet Take 1 tablet by mouth daily.    . predniSONE (DELTASONE) 10 MG tablet Take 4 tablets (40 mg total) by mouth daily with breakfast. 15 tablet 0  . PROCTO-MED HC 2.5 % rectal cream Apply 1 application topically daily as needed for hemorrhoids or itching.   1  . verapamil (CALAN-SR) 180 MG CR tablet Take 180 mg by mouth 2 (two) times daily.   4  . Witch Hazel (PREPARATION H EX) Apply topically as needed.    . ondansetron (ZOFRAN) 4 MG tablet Take 1 tablet (4 mg total) by mouth 4 (four) times daily -  before meals and at bedtime. 120 tablet 1   No current facility-administered medications for this visit.     Allergies as of 11/14/2018 - Review Complete 11/14/2018  Allergen Reaction Noted  . Paroxetine Itching   . Sulfa antibiotics Other (See Comments) 12/13/2011  . Venlafaxine Itching   . Nalbuphine Rash     Family History  Problem Relation Age of Onset  . Diabetes Mother   . Alzheimer's disease Mother   . Heart disease Father   . Heart disease Brother        Also mother, Father, brother and 2 sons  . Aneurysm Son   . Diabetes Son   . Hypertension Brother        also Sister x2  . Colon cancer Neg Hx     Social History   Socioeconomic History  . Marital status: Married    Spouse name: Not on file  . Number of children: 4  . Years of education: Not on file  . Highest education level: Not on file  Occupational History  . Not on file  Social Needs  . Financial resource strain: Not on file  . Food insecurity    Worry: Not on file    Inability: Not on file  . Transportation needs    Medical: Not on file    Non-medical: Not on file  Tobacco Use  . Smoking status: Former Smoker     Packs/day: 1.00    Years: 55.00    Pack years: 55.00    Quit date: 09/15/2010    Years since quitting: 8.1  . Smokeless tobacco: Never Used  Substance and Sexual Activity  . Alcohol use: No    Alcohol/week: 0.0 standard drinks  . Drug use: No  . Sexual activity: Not Currently  Lifestyle  . Physical activity    Days per week: Not on file    Minutes per session: Not on file  . Stress: Not on file  Relationships  . Social connections    Talks  on phone: Not on file    Gets together: Not on file    Attends religious service: Not on file    Active member of club or organization: Not on file    Attends meetings of clubs or organizations: Not on file    Relationship status: Not on file  Other Topics Concern  . Not on file  Social History Narrative  . Not on file    Review of Systems: Gen: Denies fever, chills, anorexia. Denies fatigue, weakness, weight loss.  CV: Denies chest pain, palpitations, syncope, peripheral edema, and claudication. Resp: Denies dyspnea at rest, cough, wheezing, coughing up blood, and pleurisy. GI: Denies vomiting blood, jaundice, and fecal incontinence.   Denies dysphagia or odynophagia. Derm: Denies rash, itching, dry skin Psych: Denies depression, anxiety, memory loss, confusion. No homicidal or suicidal ideation.  Heme: Denies bruising, bleeding, and enlarged lymph nodes.  Physical Exam: BP (!) 155/84   Pulse 66   Temp (!) 97 F (36.1 C) (Oral)   Ht 5' (1.524 m)   Wt 141 lb 6.4 oz (64.1 kg)   BMI 27.62 kg/m  General:   Alert and oriented. No distress noted. Pleasant and cooperative.  Head:  Normocephalic and atraumatic. Eyes:  Conjuctiva clear without scleral icterus. Mouth:  Oral mucosa pink and moist.  Abdomen:  +BS, soft, mild TTP epigastric and RUQ and non-distended. No rebound or guarding. No HSM or masses noted. Msk:  Symmetrical without gross deformities. Normal posture. Extremities:  Without edema. Neurologic:  Alert and  oriented  x4 Psych:  Alert and cooperative. Normal mood and affect.  Lab Results  Component Value Date   ALT 14 10/06/2018   AST 14 (L) 10/06/2018   ALKPHOS 80 10/06/2018   BILITOT 0.3 10/06/2018   Lab Results  Component Value Date   CREATININE 1.70 (H) 10/06/2018   BUN 25 (H) 10/06/2018   NA 138 10/06/2018   K 3.2 (L) 10/06/2018   CL 105 10/06/2018   CO2 22 10/06/2018   Lab Results  Component Value Date   WBC 6.5 10/06/2018   HGB 12.0 10/06/2018   HCT 36.1 10/06/2018   MCV 87.6 10/06/2018   PLT 295 10/06/2018

## 2018-11-18 ENCOUNTER — Telehealth: Payer: Self-pay | Admitting: Internal Medicine

## 2018-11-18 NOTE — Telephone Encounter (Signed)
Pt is taking oral Zofran and feels it isn't helping the nausea. Pt is scheduled for a procedure with RMR on 9/15. Pt wants to take Phenergan Suppositories to see if it will help with the nausea she is having. Please advise.

## 2018-11-18 NOTE — Telephone Encounter (Signed)
I would like to avoid phenergan if at all possible. Make sure taking Zofran scheduled before meals. Prilosec twice a day. Stick with bland diet. Progress report later this week.

## 2018-11-18 NOTE — Telephone Encounter (Signed)
Noted. Pt was given recommendations of Ab and will call back if the Zofran taken before meals and Prilosec bid aren't helping.

## 2018-11-18 NOTE — Telephone Encounter (Signed)
Pt needs to speak with nurse about her medication. [phone]

## 2018-11-19 NOTE — Assessment & Plan Note (Addendum)
83 year old female presenting today in routine follow-up and now noting nausea since visit in March 2020, worsened with eating. Denies outright pain and states more of a "sickness" in epigastric area, vague discomfort in umbilicus and radiating to RUQ. GERD symptoms worsened. I note she has lost 6 lbs since visit in March 2020. Labs including CBC, HFP, lipase are normal. CT abd/pelvis without contrast on file without acute findings. Gallbladder is absent.   Last EGD in 2012. With renal function, unable to pursue CT angiogram to further assess vasculature; although she does not have pain with eating, she does have documented weight loss and multiple comorbidities that would raise concern for chronic mesenteric ischemia. Need to keep delayed gastric emptying a possibility but would not explain vague abdominal discomfort. We discussed pursuing EGD first in a timely manner and increasing supportive measures in the interim.   Increase PPI to BID. Zofran scheduled before meals. She is requesting phenergan, but I would like to hold off on this unless severe nausea not improved with scheduled PPI and Zofran.  Proceed with upper endoscopy in the near future with Dr. Gala Romney. The risks, benefits, and alternatives have been discussed in detail with patient. They have stated understanding and desire to proceed.  She has done well with conscious sedation historically. If symptoms worsen, develops acute abdominal pain, unable to tolerate liquids, present to ED.  If EGD negative, will need to pursue further evaluation depending on clinical presentation; renal function limits contrast studies.

## 2018-11-26 ENCOUNTER — Encounter (HOSPITAL_COMMUNITY): Payer: Self-pay

## 2018-11-26 ENCOUNTER — Emergency Department (HOSPITAL_COMMUNITY)
Admission: EM | Admit: 2018-11-26 | Discharge: 2018-11-26 | Disposition: A | Discharge status: Home / Self Care | Payer: Medicare Other | Attending: Emergency Medicine | Admitting: Emergency Medicine

## 2018-11-26 ENCOUNTER — Other Ambulatory Visit: Payer: Self-pay

## 2018-11-26 DIAGNOSIS — I2510 Atherosclerotic heart disease of native coronary artery without angina pectoris: Secondary | ICD-10-CM | POA: Insufficient documentation

## 2018-11-26 DIAGNOSIS — N183 Chronic kidney disease, stage 3 (moderate): Secondary | ICD-10-CM | POA: Diagnosis not present

## 2018-11-26 DIAGNOSIS — I129 Hypertensive chronic kidney disease with stage 1 through stage 4 chronic kidney disease, or unspecified chronic kidney disease: Secondary | ICD-10-CM | POA: Insufficient documentation

## 2018-11-26 DIAGNOSIS — E039 Hypothyroidism, unspecified: Secondary | ICD-10-CM | POA: Insufficient documentation

## 2018-11-26 DIAGNOSIS — R110 Nausea: Secondary | ICD-10-CM | POA: Diagnosis not present

## 2018-11-26 DIAGNOSIS — Z79899 Other long term (current) drug therapy: Secondary | ICD-10-CM | POA: Diagnosis not present

## 2018-11-26 DIAGNOSIS — Z87891 Personal history of nicotine dependence: Secondary | ICD-10-CM | POA: Insufficient documentation

## 2018-11-26 LAB — COMPREHENSIVE METABOLIC PANEL
ALT: 12 U/L (ref 0–44)
AST: 12 U/L — ABNORMAL LOW (ref 15–41)
Albumin: 3.5 g/dL (ref 3.5–5.0)
Alkaline Phosphatase: 50 U/L (ref 38–126)
Anion gap: 9 (ref 5–15)
BUN: 35 mg/dL — ABNORMAL HIGH (ref 8–23)
CO2: 25 mmol/L (ref 22–32)
Calcium: 8.8 mg/dL — ABNORMAL LOW (ref 8.9–10.3)
Chloride: 105 mmol/L (ref 98–111)
Creatinine, Ser: 2.06 mg/dL — ABNORMAL HIGH (ref 0.44–1.00)
GFR calc Af Amer: 26 mL/min — ABNORMAL LOW (ref >60)
GFR calc non Af Amer: 22 mL/min — ABNORMAL LOW (ref >60)
Glucose, Bld: 94 mg/dL (ref 70–99)
Potassium: 3.3 mmol/L — ABNORMAL LOW (ref 3.5–5.1)
Sodium: 139 mmol/L (ref 135–145)
Total Bilirubin: 0.4 mg/dL (ref 0.3–1.2)
Total Protein: 6.6 g/dL (ref 6.5–8.1)

## 2018-11-26 LAB — URINALYSIS, ROUTINE W REFLEX MICROSCOPIC
Bacteria, UA: NONE SEEN
Bilirubin Urine: NEGATIVE
Glucose, UA: NEGATIVE mg/dL
Hgb urine dipstick: NEGATIVE
Ketones, ur: NEGATIVE mg/dL
Nitrite: NEGATIVE
Protein, ur: 30 mg/dL — AB
Specific Gravity, Urine: 1.013 (ref 1.005–1.030)
pH: 5 (ref 5.0–8.0)

## 2018-11-26 LAB — CBC WITH DIFFERENTIAL/PLATELET
Abs Immature Granulocytes: 0.01 10*3/uL (ref 0.00–0.07)
Basophils Absolute: 0 10*3/uL (ref 0.0–0.1)
Basophils Relative: 1 %
Eosinophils Absolute: 0.2 10*3/uL (ref 0.0–0.5)
Eosinophils Relative: 3 %
HCT: 34.9 % — ABNORMAL LOW (ref 36.0–46.0)
Hemoglobin: 11.3 g/dL — ABNORMAL LOW (ref 12.0–15.0)
Immature Granulocytes: 0 %
Lymphocytes Relative: 38 %
Lymphs Abs: 2.6 10*3/uL (ref 0.7–4.0)
MCH: 29.1 pg (ref 26.0–34.0)
MCHC: 32.4 g/dL (ref 30.0–36.0)
MCV: 89.9 fL (ref 80.0–100.0)
Monocytes Absolute: 0.6 10*3/uL (ref 0.1–1.0)
Monocytes Relative: 8 %
Neutro Abs: 3.4 10*3/uL (ref 1.7–7.7)
Neutrophils Relative %: 50 %
Platelets: 263 10*3/uL (ref 150–400)
RBC: 3.88 MIL/uL (ref 3.87–5.11)
RDW: 14.6 % (ref 11.5–15.5)
WBC: 6.9 10*3/uL (ref 4.0–10.5)
nRBC: 0 % (ref 0.0–0.2)

## 2018-11-26 LAB — LIPASE, BLOOD: Lipase: 30 U/L (ref 11–51)

## 2018-11-26 MED ORDER — SODIUM CHLORIDE 0.9 % IV BOLUS
500.0000 mL | Freq: Once | INTRAVENOUS | Status: AC
  Administered 2018-11-26: 500 mL via INTRAVENOUS

## 2018-11-26 MED ORDER — ONDANSETRON HCL 4 MG/2ML IJ SOLN
4.0000 mg | Freq: Once | INTRAMUSCULAR | Status: AC
  Administered 2018-11-26: 4 mg via INTRAVENOUS
  Filled 2018-11-26: qty 2

## 2018-11-26 MED ORDER — PROCHLORPERAZINE MALEATE 5 MG PO TABS: 5.0000 mg | ORAL_TABLET | Freq: Two times a day (BID) | ORAL | 0 refills | Status: DC | PRN

## 2018-11-26 NOTE — ED Triage Notes (Signed)
Pt reports nausea x 1 month which has gotten worse for the past 3 days. She has a procedure scheduled for the 15th with Dr Sydell Axon to see what is causing chronic nausea.

## 2018-11-26 NOTE — Discharge Instructions (Signed)
You were seen in the emergency department for nausea.  Your lab work did not show any obvious cause of your symptoms.  We are prescribing you a new nausea medication that may help.  Please review this with your primary care doctor and your GI doctor before starting.  Your potassium was mildly low and you should continue your potassium supplement.  Please return to the emergency department if any worsening symptoms.
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
## 2018-12-03 ENCOUNTER — Telehealth: Payer: Self-pay | Admitting: *Deleted

## 2018-12-03 NOTE — Telephone Encounter (Signed)
Pt wants to see if she can get Linzess samples.  Pt says that she has a procedure coming up 12/10/2018. Pt's number is [phone].

## 2018-12-03 NOTE — Telephone Encounter (Signed)
Spoke with pt. 2 boxes of Linzess 145 mcg were left up front for pt to pick up.

## 2018-12-06 ENCOUNTER — Other Ambulatory Visit (HOSPITAL_COMMUNITY)
Admission: RE | Admit: 2018-12-06 | Discharge: 2018-12-06 | Disposition: A | Discharge status: Home / Self Care | Payer: Medicare Other | Source: Ambulatory Visit | Attending: Internal Medicine | Admitting: Internal Medicine

## 2018-12-06 ENCOUNTER — Other Ambulatory Visit: Payer: Self-pay

## 2018-12-06 DIAGNOSIS — Z01812 Encounter for preprocedural laboratory examination: Secondary | ICD-10-CM | POA: Insufficient documentation

## 2018-12-06 DIAGNOSIS — Z20828 Contact with and (suspected) exposure to other viral communicable diseases: Secondary | ICD-10-CM | POA: Diagnosis not present

## 2018-12-06 LAB — SARS CORONAVIRUS 2 (TAT 6-24 HRS): SARS Coronavirus 2: NEGATIVE

## 2018-12-10 ENCOUNTER — Encounter (HOSPITAL_COMMUNITY)
Admission: RE | Disposition: A | Discharge status: Home / Self Care | Payer: Self-pay | Source: Home / Self Care | Attending: Internal Medicine

## 2018-12-10 ENCOUNTER — Ambulatory Visit (HOSPITAL_COMMUNITY)
Admission: RE | Admit: 2018-12-10 | Discharge: 2018-12-10 | Disposition: A | Discharge status: Home / Self Care | Payer: Medicare Other | Attending: Internal Medicine | Admitting: Internal Medicine

## 2018-12-10 ENCOUNTER — Encounter (HOSPITAL_COMMUNITY): Payer: Self-pay

## 2018-12-10 DIAGNOSIS — R1013 Epigastric pain: Secondary | ICD-10-CM

## 2018-12-10 DIAGNOSIS — R634 Abnormal weight loss: Secondary | ICD-10-CM

## 2018-12-10 HISTORY — DX: Thyrotoxicosis, unspecified without thyrotoxic crisis or storm: E05.90

## 2018-12-10 SURGERY — EGD (ESOPHAGOGASTRODUODENOSCOPY)
Anesthesia: Moderate Sedation

## 2018-12-10 MED ORDER — LIDOCAINE VISCOUS HCL 2 % MT SOLN
OROMUCOSAL | Status: AC
  Filled 2018-12-10: qty 15

## 2018-12-10 MED ORDER — MEPERIDINE HCL 50 MG/ML IJ SOLN
INTRAMUSCULAR | Status: AC
  Filled 2018-12-10: qty 1

## 2018-12-10 MED ORDER — ONDANSETRON HCL 4 MG/2ML IJ SOLN
INTRAMUSCULAR | Status: AC
  Filled 2018-12-10: qty 2

## 2018-12-10 MED ORDER — SODIUM CHLORIDE 0.9 % IV SOLN: INTRAVENOUS | Status: DC

## 2018-12-10 MED ORDER — MIDAZOLAM HCL 5 MG/5ML IJ SOLN
INTRAMUSCULAR | Status: AC
  Filled 2018-12-10: qty 10

## 2018-12-11 ENCOUNTER — Other Ambulatory Visit: Payer: Self-pay

## 2018-12-11 ENCOUNTER — Telehealth: Payer: Self-pay

## 2018-12-11 DIAGNOSIS — R634 Abnormal weight loss: Secondary | ICD-10-CM

## 2018-12-11 DIAGNOSIS — R1013 Epigastric pain: Secondary | ICD-10-CM

## 2018-12-11 NOTE — Telephone Encounter (Signed)
Called pt, EGD w/Propofol w/RMR scheduled for 02/13/19 at 3:00pm. Orders entered.

## 2018-12-11 NOTE — Telephone Encounter (Signed)
Tried to call pt, line busy

## 2018-12-11 NOTE — Telephone Encounter (Signed)
Pre-op appt 02/10/19 at 10:00am, COVID test 02/10/19 at 11:00am. Appt letter mailed with procedure instructions.

## 2018-12-11 NOTE — Telephone Encounter (Signed)
-----   Message from Daneil Dolin, MD sent at 12/10/2018  2:18 PM EDT ----- I canceled EGD today.  Patient presented this afternoon and wanted to be put out all the way with propofol (called by name).  Gets Xanax off the street.  Took a fair amount of Demerol, Versed and Phenergan 8 years ago.  She is scheduled with conscious sedation today.I told her I could not meet her needs with conscious sedation and she would likely not have a satisfactory experience.  Consequently, canceled the procedure.  Need to regroup with propofol.

## 2019-01-06 ENCOUNTER — Encounter: Payer: Self-pay | Admitting: Pulmonary Disease

## 2019-01-06 DIAGNOSIS — M545 Low back pain: Secondary | ICD-10-CM | POA: Diagnosis not present

## 2019-01-06 DIAGNOSIS — J449 Chronic obstructive pulmonary disease, unspecified: Secondary | ICD-10-CM | POA: Diagnosis not present

## 2019-01-06 DIAGNOSIS — Z23 Encounter for immunization: Secondary | ICD-10-CM | POA: Diagnosis not present

## 2019-01-06 DIAGNOSIS — I129 Hypertensive chronic kidney disease with stage 1 through stage 4 chronic kidney disease, or unspecified chronic kidney disease: Secondary | ICD-10-CM | POA: Diagnosis not present

## 2019-01-30 ENCOUNTER — Ambulatory Visit: Payer: Medicare Other | Admitting: Family Medicine

## 2019-02-07 ENCOUNTER — Encounter (HOSPITAL_COMMUNITY): Payer: Self-pay

## 2019-02-07 ENCOUNTER — Other Ambulatory Visit: Payer: Self-pay

## 2019-02-10 ENCOUNTER — Encounter (HOSPITAL_COMMUNITY)
Admission: RE | Admit: 2019-02-10 | Discharge: 2019-02-10 | Disposition: A | Discharge status: Home / Self Care | Payer: Medicare Other | Source: Ambulatory Visit | Attending: Internal Medicine | Admitting: Internal Medicine

## 2019-02-10 ENCOUNTER — Other Ambulatory Visit: Payer: Self-pay

## 2019-02-10 ENCOUNTER — Other Ambulatory Visit (HOSPITAL_COMMUNITY)
Admission: RE | Admit: 2019-02-10 | Discharge: 2019-02-10 | Disposition: A | Discharge status: Home / Self Care | Payer: Medicare Other | Source: Ambulatory Visit | Attending: Internal Medicine | Admitting: Internal Medicine

## 2019-02-10 DIAGNOSIS — Z01812 Encounter for preprocedural laboratory examination: Secondary | ICD-10-CM | POA: Insufficient documentation

## 2019-02-10 DIAGNOSIS — Z20828 Contact with and (suspected) exposure to other viral communicable diseases: Secondary | ICD-10-CM | POA: Diagnosis not present

## 2019-02-10 LAB — SARS CORONAVIRUS 2 (TAT 6-24 HRS): SARS Coronavirus 2: NEGATIVE

## 2019-02-10 LAB — NOVEL CORONAVIRUS, NAA: SARS-CoV-2, NAA: NOT DETECTED

## 2019-02-13 ENCOUNTER — Other Ambulatory Visit: Payer: Self-pay

## 2019-02-13 ENCOUNTER — Ambulatory Visit (HOSPITAL_COMMUNITY): Payer: Medicare Other | Admitting: Anesthesiology

## 2019-02-13 ENCOUNTER — Encounter (HOSPITAL_COMMUNITY)
Admission: RE | Disposition: A | Discharge status: Home / Self Care | Payer: Self-pay | Source: Home / Self Care | Attending: Internal Medicine

## 2019-02-13 ENCOUNTER — Ambulatory Visit (HOSPITAL_COMMUNITY)
Admission: RE | Admit: 2019-02-13 | Discharge: 2019-02-13 | Disposition: A | Discharge status: Home / Self Care | Payer: Medicare Other | Attending: Internal Medicine | Admitting: Internal Medicine

## 2019-02-13 ENCOUNTER — Encounter (HOSPITAL_COMMUNITY): Payer: Self-pay | Admitting: Emergency Medicine

## 2019-02-13 DIAGNOSIS — R1013 Epigastric pain: Secondary | ICD-10-CM | POA: Diagnosis not present

## 2019-02-13 DIAGNOSIS — J449 Chronic obstructive pulmonary disease, unspecified: Secondary | ICD-10-CM | POA: Insufficient documentation

## 2019-02-13 DIAGNOSIS — E039 Hypothyroidism, unspecified: Secondary | ICD-10-CM | POA: Insufficient documentation

## 2019-02-13 DIAGNOSIS — E785 Hyperlipidemia, unspecified: Secondary | ICD-10-CM | POA: Diagnosis not present

## 2019-02-13 DIAGNOSIS — K30 Functional dyspepsia: Secondary | ICD-10-CM | POA: Diagnosis not present

## 2019-02-13 DIAGNOSIS — R110 Nausea: Secondary | ICD-10-CM | POA: Insufficient documentation

## 2019-02-13 DIAGNOSIS — K219 Gastro-esophageal reflux disease without esophagitis: Secondary | ICD-10-CM | POA: Diagnosis not present

## 2019-02-13 DIAGNOSIS — Z79890 Hormone replacement therapy (postmenopausal): Secondary | ICD-10-CM | POA: Insufficient documentation

## 2019-02-13 DIAGNOSIS — R1310 Dysphagia, unspecified: Secondary | ICD-10-CM | POA: Diagnosis not present

## 2019-02-13 DIAGNOSIS — I10 Essential (primary) hypertension: Secondary | ICD-10-CM | POA: Diagnosis not present

## 2019-02-13 DIAGNOSIS — Z79891 Long term (current) use of opiate analgesic: Secondary | ICD-10-CM | POA: Insufficient documentation

## 2019-02-13 DIAGNOSIS — I129 Hypertensive chronic kidney disease with stage 1 through stage 4 chronic kidney disease, or unspecified chronic kidney disease: Secondary | ICD-10-CM | POA: Diagnosis not present

## 2019-02-13 DIAGNOSIS — I2510 Atherosclerotic heart disease of native coronary artery without angina pectoris: Secondary | ICD-10-CM | POA: Insufficient documentation

## 2019-02-13 DIAGNOSIS — Z87891 Personal history of nicotine dependence: Secondary | ICD-10-CM | POA: Diagnosis not present

## 2019-02-13 DIAGNOSIS — Z79899 Other long term (current) drug therapy: Secondary | ICD-10-CM | POA: Insufficient documentation

## 2019-02-13 DIAGNOSIS — R634 Abnormal weight loss: Secondary | ICD-10-CM

## 2019-02-13 HISTORY — PX: ESOPHAGOGASTRODUODENOSCOPY (EGD) WITH PROPOFOL: SHX5813

## 2019-02-13 SURGERY — ESOPHAGOGASTRODUODENOSCOPY (EGD) WITH PROPOFOL
Anesthesia: General

## 2019-02-13 MED ORDER — LACTATED RINGERS IV SOLN
Freq: Once | INTRAVENOUS | Status: AC
  Administered 2019-02-13: 14:00:00 via INTRAVENOUS

## 2019-02-13 MED ORDER — LACTATED RINGERS IV SOLN
INTRAVENOUS | Status: DC | PRN
  Administered 2019-02-13: 15:00:00 via INTRAVENOUS

## 2019-02-13 MED ORDER — PROPOFOL 500 MG/50ML IV EMUL
INTRAVENOUS | Status: DC | PRN
  Administered 2019-02-13: 150 ug/kg/min via INTRAVENOUS

## 2019-02-13 MED ORDER — CHLORHEXIDINE GLUCONATE CLOTH 2 % EX PADS: 6.0000 | MEDICATED_PAD | Freq: Once | CUTANEOUS | Status: DC

## 2019-02-13 MED ORDER — PROPOFOL 10 MG/ML IV BOLUS
INTRAVENOUS | Status: DC | PRN
  Administered 2019-02-13: 30 mg via INTRAVENOUS

## 2019-02-13 MED ORDER — LIDOCAINE HCL 1 % IJ SOLN
INTRAMUSCULAR | Status: DC | PRN
  Administered 2019-02-13: 30 mg via INTRADERMAL

## 2019-02-13 NOTE — Op Note (Signed)
[facility] Patient Name: [name] Procedure Date: 02/13/2019 3:23 PM MRN: [id number] Date of Birth: 10/25/1935 Attending MD: Norvel Richards , MD CSN: [id number] Age: 83 Admit Type: Outpatient Procedure:                Upper GI endoscopy Indications:              Dyspepsi/ nausea Providers:                Norvel Richards, MD, Aram Candela, Janeece Riggers, RN Referring MD:             Jasper Loser. Luan Pulling MD, MD Medicines:                Propofol per Anesthesia Complications:            No immediate complications. Estimated Blood Loss:     Estimated blood loss was minimal. Procedure:                Pre-Anesthesia Assessment:                           - Prior to the procedure, a History and Physical                            was performed, and patient medications and                            allergies were reviewed. The patient's tolerance of                            previous anesthesia was also reviewed. The risks                            and benefits of the procedure and the sedation                            options and risks were discussed with the patient.                            All questions were answered, and informed consent                            was obtained. Prior Anticoagulants: The patient has                            taken no previous anticoagulant or antiplatelet                            agents. ASA Grade Assessment: III - A patient with                            severe systemic disease. After reviewing the risks  and benefits, the patient was deemed in                            satisfactory condition to undergo the procedure.                           After obtaining informed consent, the endoscope was                            passed under direct vision. Throughout the                            procedure, the patient's blood pressure, pulse, and                            oxygen  saturations were monitored continuously. The                            [id number]-H190 ([id number]) scope was introduced through the                            mouth, and advanced to the second part of duodenum.                            The upper GI endoscopy was accomplished without                            difficulty. The patient tolerated the procedure                            well. Scope In: 3:51:03 PM Scope Out: 3:55:15 PM Total Procedure Duration: 0 hours 4 minutes 12 seconds  Findings:      The examined esophagus was normal aside from an intact, appropriately       snug Nissen fundoplication.      The entire examined stomach was normal.      The duodenal bulb and second portion of the duodenum were normal. Impression:               - Normal esophagus. Intact Nissen fundoplication                           - Normal stomach.                           - Normal duodenal bulb and second portion of the                            duodenum.                           - No specimens collected. After some discussion                            with the patient, it may well be that chronic  opioids have much to do with her nausea/dyspepsia                            symptoms Moderate Sedation:      Moderate (conscious) sedation was personally administered by an       anesthesia professional. The following parameters were monitored: oxygen       saturation, heart rate, blood pressure, respiratory rate, EKG, adequacy       of pulmonary ventilation, and response to care. Recommendation:           - Patient has a contact number available for                            emergencies. The signs and symptoms of potential                            delayed complications were discussed with the                            patient. Return to normal activities tomorrow.                            Written discharge instructions were provided to the                             patient.                           - Advance diet as tolerated.                           - Continue present medications.                           - Return to my office in 3 months. Procedure Code(s):        --- Professional ---                           [id number], Esophagogastroduodenoscopy, flexible,                            transoral; diagnostic, including collection of                            specimen(s) by brushing or washing, when performed                            (separate procedure) Diagnosis Code(s):        --- Professional ---                           R10.13, Epigastric pain CPT copyright 2019 American Medical Association. All rights reserved. The codes documented in this report are preliminary and upon coder review may  be revised to meet current compliance requirements. Cristopher Estimable. Irem Stoneham, MD Norvel Richards, MD 02/13/2019 5:03:34 PM This report has been signed electronically. Number of Addenda: 0

## 2019-02-13 NOTE — Transfer of Care (Signed)
Immediate Anesthesia Transfer of Care Note  Patient: [name]  Procedure(s) Performed: ESOPHAGOGASTRODUODENOSCOPY (EGD) WITH PROPOFOL (N/A )  Patient Location: PACU  Anesthesia Type:General  Level of Consciousness: awake  Airway & Oxygen Therapy: Patient Spontanous Breathing  Post-op Assessment: Report given to RN  Post vital signs: Reviewed  Last Vitals:  Vitals Value Taken Time  BP 139/56 02/13/19 1602  Temp    Pulse 55 02/13/19 1604  Resp 17 02/13/19 1604  SpO2 99 % 02/13/19 1604  Vitals shown include unvalidated device data.  Last Pain:  Vitals:   02/13/19 1325  TempSrc: Oral  PainSc: 0-No pain         Complications: No apparent anesthesia complications

## 2019-02-13 NOTE — Discharge Instructions (Signed)
EGD Discharge instructions Please read the instructions outlined below and refer to this sheet in the next few weeks. These discharge instructions provide you with general information on caring for yourself after you leave the hospital. Your doctor may also give you specific instructions. While your treatment has been planned according to the most current medical practices available, unavoidable complications occasionally occur. If you have any problems or questions after discharge, please call your doctor. ACTIVITY  You may resume your regular activity but move at a slower pace for the next 24 hours.   Take frequent rest periods for the next 24 hours.   Walking will help expel (get rid of) the air and reduce the bloated feeling in your abdomen.   No driving for 24 hours (because of the anesthesia (medicine) used during the test).   You may shower.   Do not sign any important legal documents or operate any machinery for 24 hours (because of the anesthesia used during the test).  NUTRITION  Drink plenty of fluids.   You may resume your normal diet.   Begin with a light meal and progress to your normal diet.   Avoid alcoholic beverages for 24 hours or as instructed by your caregiver.  MEDICATIONS  You may resume your normal medications unless your caregiver tells you otherwise.  WHAT YOU CAN EXPECT TODAY  You may experience abdominal discomfort such as a feeling of fullness or gas pains.  FOLLOW-UP  Your doctor will discuss the results of your test with you.  SEEK IMMEDIATE MEDICAL ATTENTION IF ANY OF THE FOLLOWING OCCUR:  Excessive nausea (feeling sick to your stomach) and/or vomiting.   Severe abdominal pain and distention (swelling).   Trouble swallowing.   Temperature over 101 F (37.8 C).   Rectal bleeding or vomiting of blood.    Continue omeprazole 40 mg twice daily  Some of your nausea may be related to codeine-containing medications you are taking  If  possible, if you can cut back on codeine-containing medications and will likely help your nausea  Office visit with [place] in 3 months  At patient request, I called son, Jenny Reichmann, at [phone] and reviewed findings.    Monitored Anesthesia Care, Care After These instructions provide you with information about caring for yourself after your procedure. Your health care provider may also give you more specific instructions. Your treatment has been planned according to current medical practices, but problems sometimes occur. Call your health care provider if you have any problems or questions after your procedure. What can I expect after the procedure? After your procedure, you may:  Feel sleepy for several hours.  Feel clumsy and have poor balance for several hours.  Feel forgetful about what happened after the procedure.  Have poor judgment for several hours.  Feel nauseous or vomit.  Have a sore throat if you had a breathing tube during the procedure. Follow these instructions at home: For at least 24 hours after the procedure:      Have a responsible adult stay with you. It is important to have someone help care for you until you are awake and alert.  Rest as needed.  Do not: ? Participate in activities in which you could fall or become injured. ? Drive. ? Use heavy machinery. ? Drink alcohol. ? Take sleeping pills or medicines that cause drowsiness. ? Make important decisions or sign legal documents. ? Take care of children on your own. Eating and drinking  Follow the diet that is recommended  by your health care provider.  If you vomit, drink water, juice, or soup when you can drink without vomiting.  Make sure you have little or no nausea before eating solid foods. General instructions  Take over-the-counter and prescription medicines only as told by your health care provider.  If you have sleep apnea, surgery and certain medicines can increase your risk for breathing  problems. Follow instructions from your health care provider about wearing your sleep device: ? Anytime you are sleeping, including during daytime naps. ? While taking prescription pain medicines, sleeping medicines, or medicines that make you drowsy.  If you smoke, do not smoke without supervision.  Keep all follow-up visits as told by your health care provider. This is important. Contact a health care provider if:  You keep feeling nauseous or you keep vomiting.  You feel light-headed.  You develop a rash.  You have a fever. Get help right away if:  You have trouble breathing. Summary  For several hours after your procedure, you may feel sleepy and have poor judgment.  Have a responsible adult stay with you for at least 24 hours or until you are awake and alert. This information is not intended to replace advice given to you by your health care provider. Make sure you discuss any questions you have with your health care provider. Document Released: 07/04/2015 Document Revised: 06/11/2017 Document Reviewed: 07/04/2015 Elsevier Patient Education  2020 [employer].
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
## 2019-02-13 NOTE — H&P (Addendum)
. @LOGO @   Primary Care Physician:  Sinda Du, MD Primary Gastroenterologist:  Dr. Gala Romney  Pre-Procedure History & Physical: HPI:  [name] is a 83 y.o. female here for further evaluation of dyspepsia.  No dysphagia.  Nausea but no vomiting.  History of a lap Nissen distant past.  Past Medical History:  Diagnosis Date  . Arteriosclerotic cardiovascular disease (ASCVD) 2006   Nonobstructive; < 50% lesions on cath 2002; negative stress nuclear study in 10/2004  . Asthma   . Chronic pelvic pain in female   . Colitis due to Clostridium difficile [place]  . COPD (chronic obstructive pulmonary disease) ([place])   . Depression with anxiety   . Gastroesophageal reflux disease   . Hyperlipidemia   . Hypertension   . Hyperthyroidism   . Hypothyroidism   . S/P colonoscopy 2007   few diverticula, otherwise nl  . S/P colonoscopy 12/13/10   rectal, cecal polyp, left-sided diverticulosis, hyperplastic. ?anal fissure? treated empirically  . S/P endoscopy 2009   linear esophageal erosions  . S/P endoscopy 05/10/10   Question island of salmon-colored epithelium in distal esophagus ; no Barrett's.   . Shingles   . Tobacco abuse, in remission    55-pack-year consumption; discontinued 08/2010    Past Surgical History:  Procedure Laterality Date  . ABDOMINAL HYSTERECTOMY    . BREAST LUMPECTOMY    . CATARACT EXTRACTION    . CHOLECYSTECTOMY  1962  . COLONOSCOPY  12/13/2010   Left-sided diverticulosis.  Cecal polyp, status post hot snare polypectomy/ Diminutive rectal polyp, status post cold biopsy removal tender/painful anal canal, ? occult anal fissure- Not visualized  . COLONOSCOPY N/A 10/14/2015   Diverticulosis  . ESOPHAGOGASTRODUODENOSCOPY  05/10/10   benign mucosa with mild chronic inflammation.  Marland Kitchen FLEXIBLE SIGMOIDOSCOPY  12/29/2011   Procedure: FLEXIBLE SIGMOIDOSCOPY;  Surgeon: Rogene Houston, MD;  Location: [facility];  Service: Endoscopy;  Laterality: N/A;  200-Ann  notified pt to be here @ 1:00  . NISSEN FUNDOPLICATION    . YAG LASER APPLICATION Left 07/03/1446   Procedure: YAG LASER APPLICATION;  Surgeon: Williams Che, MD;  Location: [facility];  Service: Ophthalmology;  Laterality: Left;    Prior to Admission medications   Medication Sig Start Date End Date Taking? Authorizing Provider  hydrochlorothiazide (HYDRODIURIL) 25 MG tablet Take 25 mg by mouth daily.   Yes [provider]  HYDROcodone-acetaminophen (NORCO) 10-325 MG tablet Take 0.5-2 tablets by mouth every 4 (four) hours as needed (1/2 tablet for mild pain, 1 tablet for moderate pain, 2 tablets for severe pain). 05/29/15  Yes Riebock, Emina, NP  omeprazole (PRILOSEC) 40 MG capsule Take 40 mg by mouth 2 (two) times daily before a meal.   Yes [provider]  verapamil (CALAN-SR) 180 MG CR tablet Take 180 mg by mouth 2 (two) times daily.  11/19/15  Yes [provider]  albuterol (PROVENTIL HFA;VENTOLIN HFA) 108 (90 Base) MCG/ACT inhaler Inhale 1-2 puffs into the lungs every 6 (six) hours as needed for wheezing or shortness of breath.    [provider]  ALPRAZolam Duanne Moron) 1 MG tablet Take 1 mg by mouth at bedtime as needed for anxiety.    [provider]  alum & mag hydroxide-simeth (MAALOX/MYLANTA) 200-200-20 MG/5ML suspension Take 30 mLs by mouth every 6 (six) hours as needed for indigestion or heartburn.     [provider]  atorvastatin (LIPITOR) 20 MG tablet Take 20 mg every evening by mouth.  [provider]  benzonatate (TESSALON) 200 MG capsule Take 200 mg by mouth 3 (three) times daily as needed for cough.    [provider]  ESTRACE VAGINAL 0.1 MG/GM vaginal cream Apply 1 application topically once a week.  06/30/14   [provider]  glycerin, Pediatric, 1.2 g SUPP Place 1 suppository rectally daily as needed for moderate constipation. Fleet Laxative Suppository    [provider]  hydroxypropyl  methylcellulose (ISOPTO TEARS) 2.5 % ophthalmic solution Place 1 drop into both eyes 3 (three) times daily as needed. For dry eyes.    [provider]  levofloxacin (LEVAQUIN) 500 MG tablet Take 500 mg by mouth daily. 11/27/18   [provider]  levothyroxine (SYNTHROID) 75 MCG tablet Take 75 mcg by mouth daily before breakfast.  09/17/18   [provider]  Multiple Vitamins-Minerals (EYE VITAMINS PO) Take 1 tablet by mouth daily.     [provider]  ondansetron (ZOFRAN) 4 MG tablet Take 1 tablet (4 mg total) by mouth 4 (four) times daily -  before meals and at bedtime. Patient taking differently: Take 4 mg by mouth every 8 (eight) hours as needed for nausea or vomiting.  11/14/18   Annitta Needs, NP  potassium chloride SA (K-DUR) 20 MEQ tablet Take 20 mEq by mouth daily with lunch.  09/17/18   [provider]  predniSONE (DELTASONE) 10 MG tablet Take 4 tablets (40 mg total) by mouth daily with breakfast. Patient not taking: Reported on 12/05/2018 10/06/18   Darlin Drop P, PA-C  prochlorperazine (COMPAZINE) 10 MG tablet Take 5 mg by mouth 2 (two) times daily as needed for nausea. 11/27/18   [provider]  PROCTO-MED HC 2.5 % rectal cream Apply 1 application topically daily as needed for hemorrhoids or itching.  06/17/16   [provider]  Witch Hazel (PREPARATION H EX) Apply 1 application topically 3 (three) times daily as needed (irritation).     [provider]    Allergies as of 12/11/2018 - Review Complete 12/10/2018  Allergen Reaction Noted  . Paroxetine Itching   . Sulfa antibiotics Other (See Comments) 12/13/2011  . Venlafaxine Itching   . Nalbuphine Rash     Family History  Problem Relation Age of Onset  . Diabetes Mother   . Alzheimer's disease Mother   . Heart disease Father   . Heart disease Brother        Also mother, Father, brother and 2 sons  . Aneurysm Son   . Diabetes Son   . Hypertension Brother         also Sister x2  . Colon cancer Neg Hx     Social History   Socioeconomic History  . Marital status: Married    Spouse name: Not on file  . Number of children: 4  . Years of education: Not on file  . Highest education level: Not on file  Occupational History  . Not on file  Social Needs  . Financial resource strain: Not on file  . Food insecurity    Worry: Not on file    Inability: Not on file  . Transportation needs    Medical: Not on file    Non-medical: Not on file  Tobacco Use  . Smoking status: Former Smoker    Packs/day: 1.00    Years: 55.00    Pack years: 55.00    Quit date: 09/15/2010    Years since quitting: 8.4  . Smokeless tobacco: Never  Used  Substance and Sexual Activity  . Alcohol use: No    Alcohol/week: 0.0 standard drinks  . Drug use: No  . Sexual activity: Not Currently  Lifestyle  . Physical activity    Days per week: Not on file    Minutes per session: Not on file  . Stress: Not on file  Relationships  . Social Herbalist on phone: Not on file    Gets together: Not on file    Attends religious service: Not on file    Active member of club or organization: Not on file    Attends meetings of clubs or organizations: Not on file    Relationship status: Not on file  . Intimate partner violence    Fear of current or ex partner: Not on file    Emotionally abused: Not on file    Physically abused: Not on file    Forced sexual activity: Not on file  Other Topics Concern  . Not on file  Social History Narrative  . Not on file    Review of Systems: See HPI, otherwise negative ROS  Physical Exam: BP (!) 189/73   Pulse (!) 53   Temp 98.2 F (36.8 C) (Oral)   Resp 18   SpO2 97%  General:   Alert,  Well-developed, well-nourished, pleasant and cooperative in NAD Neck:  Supple; no masses or thyromegaly. No significant cervical adenopathy. Lungs:  Clear throughout to auscultation.   No wheezes, crackles, or rhonchi. No acute  distress. Heart:  Regular rate and rhythm; no murmurs, clicks, rubs,  or gallops. Abdomen: Non-distended, normal bowel sounds.  Soft and nontender without appreciable mass or hepatosplenomegaly.  Pulses:  Normal pulses noted. Extremities:  Without clubbing or edema.  Impression/Plan: 83 year old lady with a dyspepsia and nausea.  Has post lap Nissen in the distant past.  Reflux symptoms well controlled on omeprazole.  Here for diagnostic EGD.  She denies dysphagia.  The risks, benefits, limitations, alternatives and imponderables have been reviewed with the patient. Potential for esophageal dilation, biopsy, etc. have also been reviewed.  Questions have been answered. All parties agreeable.     Notice: This dictation was prepared with Dragon dictation along with smaller phrase technology. Any transcriptional errors that result from this process are unintentional and may not be corrected upon review.

## 2019-02-13 NOTE — Anesthesia Postprocedure Evaluation (Signed)
Anesthesia Post Note  Patient: [name]  Procedure(s) Performed: ESOPHAGOGASTRODUODENOSCOPY (EGD) WITH PROPOFOL (N/A )  Patient location during evaluation: PACU Anesthesia Type: General Level of consciousness: awake and alert and oriented Pain management: pain level controlled Vital Signs Assessment: post-procedure vital signs reviewed and stable Respiratory status: spontaneous breathing Cardiovascular status: blood pressure returned to baseline and stable Postop Assessment: no apparent nausea or vomiting Anesthetic complications: no     Last Vitals:  Vitals:   02/13/19 1325  BP: (!) 189/73  Pulse: (!) 53  Resp: 18  Temp: 36.8 C  SpO2: 97%    Last Pain:  Vitals:   02/13/19 1325  TempSrc: Oral  PainSc: 0-No pain                 Dupree Givler

## 2019-02-17 ENCOUNTER — Encounter (HOSPITAL_COMMUNITY): Payer: Self-pay | Admitting: Internal Medicine

## 2019-02-27 DIAGNOSIS — I129 Hypertensive chronic kidney disease with stage 1 through stage 4 chronic kidney disease, or unspecified chronic kidney disease: Secondary | ICD-10-CM | POA: Diagnosis not present

## 2019-02-27 DIAGNOSIS — J441 Chronic obstructive pulmonary disease with (acute) exacerbation: Secondary | ICD-10-CM | POA: Diagnosis not present

## 2019-02-27 DIAGNOSIS — N183 Chronic kidney disease, stage 3 unspecified: Secondary | ICD-10-CM | POA: Diagnosis not present

## 2019-03-23 DIAGNOSIS — N390 Urinary tract infection, site not specified: Secondary | ICD-10-CM

## 2019-03-23 DIAGNOSIS — R197 Diarrhea, unspecified: Secondary | ICD-10-CM

## 2019-03-23 DIAGNOSIS — B0229 Other postherpetic nervous system involvement: Secondary | ICD-10-CM | POA: Insufficient documentation

## 2019-03-23 DIAGNOSIS — R21 Rash and other nonspecific skin eruption: Secondary | ICD-10-CM

## 2019-03-23 DIAGNOSIS — R079 Chest pain, unspecified: Secondary | ICD-10-CM

## 2019-03-23 DIAGNOSIS — R102 Pelvic and perineal pain unspecified side: Secondary | ICD-10-CM

## 2019-03-23 DIAGNOSIS — R1030 Lower abdominal pain, unspecified: Secondary | ICD-10-CM

## 2019-03-23 DIAGNOSIS — F329 Major depressive disorder, single episode, unspecified: Secondary | ICD-10-CM

## 2019-03-23 DIAGNOSIS — R110 Nausea: Secondary | ICD-10-CM

## 2019-03-23 DIAGNOSIS — E785 Hyperlipidemia, unspecified: Secondary | ICD-10-CM

## 2019-03-23 DIAGNOSIS — J449 Chronic obstructive pulmonary disease, unspecified: Secondary | ICD-10-CM

## 2019-03-23 DIAGNOSIS — N183 Chronic kidney disease, stage 3 unspecified: Secondary | ICD-10-CM

## 2019-03-23 DIAGNOSIS — M1990 Unspecified osteoarthritis, unspecified site: Secondary | ICD-10-CM

## 2019-03-23 DIAGNOSIS — E039 Hypothyroidism, unspecified: Secondary | ICD-10-CM

## 2019-03-23 DIAGNOSIS — F419 Anxiety disorder, unspecified: Secondary | ICD-10-CM

## 2019-03-23 DIAGNOSIS — J45909 Unspecified asthma, uncomplicated: Secondary | ICD-10-CM

## 2019-03-23 DIAGNOSIS — I129 Hypertensive chronic kidney disease with stage 1 through stage 4 chronic kidney disease, or unspecified chronic kidney disease: Secondary | ICD-10-CM

## 2019-03-23 DIAGNOSIS — F322 Major depressive disorder, single episode, severe without psychotic features: Secondary | ICD-10-CM

## 2019-03-23 DIAGNOSIS — E079 Disorder of thyroid, unspecified: Secondary | ICD-10-CM

## 2019-03-23 DIAGNOSIS — N189 Chronic kidney disease, unspecified: Secondary | ICD-10-CM

## 2019-03-23 DIAGNOSIS — K219 Gastro-esophageal reflux disease without esophagitis: Secondary | ICD-10-CM

## 2019-04-08 ENCOUNTER — Telehealth: Payer: Self-pay | Admitting: Internal Medicine

## 2019-04-08 NOTE — Telephone Encounter (Signed)
Pt called asking to speak with AB. She had procedure recently and is feeling worst ever since. She said she was swelling and looks pregnant. Please call patient at [phone]

## 2019-04-08 NOTE — Telephone Encounter (Signed)
Spoke with pt. Pt notified of of AB's recommendations. Pt isn't passing gas nor vomiting and will try the Linzess and Miralax. Pt picked Linzess up from her pharmacy yesterday and won't need a sample. Pt is aware that if she does pass gas/bowel movement, she needs to have ED evaluation.

## 2019-04-08 NOTE — Telephone Encounter (Signed)
EGD was in Nov 2020. If she is truly not passing any gas and abdomen is distended, would be concerned about ileus or obstruction and would need to seek medical attention. If not vomiting and passing gas, would recommend taking Miralax today and pick up Linzess to start ASAP.

## 2019-04-08 NOTE — Telephone Encounter (Signed)
Spoke with pt. Pt feels that she's having some problems s/p her EGD. Pt states her upper abdomen is swollen and looks as though pt is 3 months pregnant. Pt says she's not able to pass gas. Pt isn't taking anything to help gas pass since she hasn't gotten consent from the doctor. Pt will get some abdominal pain on rt side of her abdomen and pts bowels aren't moving daily. Pt is having a bowel movement q 3 days by taking a dulcolax or suppository. Pts Linzess has been approved and pt will start it tomorrow.

## 2019-04-08 NOTE — Telephone Encounter (Signed)
Patient called back and asking for a call back

## 2019-04-15 DIAGNOSIS — E785 Hyperlipidemia, unspecified: Secondary | ICD-10-CM | POA: Diagnosis not present

## 2019-04-15 DIAGNOSIS — E039 Hypothyroidism, unspecified: Secondary | ICD-10-CM | POA: Diagnosis not present

## 2019-04-15 DIAGNOSIS — I10 Essential (primary) hypertension: Secondary | ICD-10-CM | POA: Diagnosis not present

## 2019-04-15 DIAGNOSIS — M130 Polyarthritis, unspecified: Secondary | ICD-10-CM | POA: Diagnosis not present

## 2019-04-17 DIAGNOSIS — E039 Hypothyroidism, unspecified: Secondary | ICD-10-CM | POA: Diagnosis not present

## 2019-04-17 DIAGNOSIS — M130 Polyarthritis, unspecified: Secondary | ICD-10-CM | POA: Diagnosis not present

## 2019-04-17 DIAGNOSIS — I10 Essential (primary) hypertension: Secondary | ICD-10-CM | POA: Diagnosis not present

## 2019-04-17 DIAGNOSIS — E785 Hyperlipidemia, unspecified: Secondary | ICD-10-CM | POA: Diagnosis not present

## 2019-04-28 DIAGNOSIS — Z79891 Long term (current) use of opiate analgesic: Secondary | ICD-10-CM | POA: Diagnosis not present

## 2019-04-28 DIAGNOSIS — M79605 Pain in left leg: Secondary | ICD-10-CM | POA: Diagnosis not present

## 2019-04-28 DIAGNOSIS — M130 Polyarthritis, unspecified: Secondary | ICD-10-CM | POA: Diagnosis not present

## 2019-04-28 DIAGNOSIS — E039 Hypothyroidism, unspecified: Secondary | ICD-10-CM | POA: Diagnosis not present

## 2019-04-28 DIAGNOSIS — M79604 Pain in right leg: Secondary | ICD-10-CM | POA: Diagnosis not present

## 2019-05-09 DIAGNOSIS — Z79891 Long term (current) use of opiate analgesic: Secondary | ICD-10-CM | POA: Diagnosis not present

## 2019-05-15 DIAGNOSIS — Z79891 Long term (current) use of opiate analgesic: Secondary | ICD-10-CM | POA: Diagnosis not present

## 2019-05-19 DIAGNOSIS — M130 Polyarthritis, unspecified: Secondary | ICD-10-CM | POA: Diagnosis not present

## 2019-05-19 DIAGNOSIS — I10 Essential (primary) hypertension: Secondary | ICD-10-CM | POA: Diagnosis not present

## 2019-05-19 DIAGNOSIS — E039 Hypothyroidism, unspecified: Secondary | ICD-10-CM | POA: Diagnosis not present

## 2019-05-19 DIAGNOSIS — Z79891 Long term (current) use of opiate analgesic: Secondary | ICD-10-CM | POA: Diagnosis not present

## 2019-05-19 DIAGNOSIS — Z79899 Other long term (current) drug therapy: Secondary | ICD-10-CM | POA: Diagnosis not present

## 2019-05-20 ENCOUNTER — Ambulatory Visit: Payer: Medicare Other | Admitting: Gastroenterology

## 2019-05-24 ENCOUNTER — Emergency Department (HOSPITAL_COMMUNITY)
Admission: EM | Admit: 2019-05-24 | Discharge: 2019-05-24 | Disposition: A | Discharge status: Home / Self Care | Payer: Medicare Other | Attending: Emergency Medicine | Admitting: Emergency Medicine

## 2019-05-24 ENCOUNTER — Other Ambulatory Visit: Payer: Self-pay

## 2019-05-24 ENCOUNTER — Encounter (HOSPITAL_COMMUNITY): Payer: Self-pay | Admitting: Emergency Medicine

## 2019-05-24 DIAGNOSIS — Z79899 Other long term (current) drug therapy: Secondary | ICD-10-CM | POA: Diagnosis not present

## 2019-05-24 DIAGNOSIS — H9201 Otalgia, right ear: Secondary | ICD-10-CM | POA: Insufficient documentation

## 2019-05-24 DIAGNOSIS — R21 Rash and other nonspecific skin eruption: Secondary | ICD-10-CM | POA: Diagnosis present

## 2019-05-24 DIAGNOSIS — B029 Zoster without complications: Secondary | ICD-10-CM | POA: Insufficient documentation

## 2019-05-24 DIAGNOSIS — I129 Hypertensive chronic kidney disease with stage 1 through stage 4 chronic kidney disease, or unspecified chronic kidney disease: Secondary | ICD-10-CM | POA: Insufficient documentation

## 2019-05-24 DIAGNOSIS — N189 Chronic kidney disease, unspecified: Secondary | ICD-10-CM | POA: Diagnosis not present

## 2019-05-24 MED ORDER — VALACYCLOVIR HCL 1 G PO TABS: 1000.0000 mg | ORAL_TABLET | Freq: Three times a day (TID) | ORAL | 0 refills | Status: DC

## 2019-05-24 NOTE — ED Triage Notes (Signed)
Pt states she has a rash on her neck with right ear pain and neck stiffness. Vesicular rash noted to right side of throat and back of right neck.

## 2019-05-24 NOTE — Discharge Instructions (Addendum)
Return if any problems.
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
## 2019-05-28 ENCOUNTER — Ambulatory Visit (INDEPENDENT_AMBULATORY_CARE_PROVIDER_SITE_OTHER): Payer: Medicare Other | Admitting: Orthopedic Surgery

## 2019-05-28 ENCOUNTER — Encounter: Payer: Self-pay | Admitting: Orthopedic Surgery

## 2019-05-28 ENCOUNTER — Other Ambulatory Visit: Payer: Self-pay

## 2019-05-28 ENCOUNTER — Ambulatory Visit: Payer: Medicare Other

## 2019-05-28 VITALS — BP 177/89 | HR 71 | Temp 97.4°F | Ht 60.0 in | Wt 136.0 lb

## 2019-05-28 DIAGNOSIS — M25561 Pain in right knee: Secondary | ICD-10-CM

## 2019-05-28 DIAGNOSIS — M25562 Pain in left knee: Secondary | ICD-10-CM

## 2019-05-28 DIAGNOSIS — G8929 Other chronic pain: Secondary | ICD-10-CM

## 2019-05-28 NOTE — Progress Notes (Signed)
[name]  05/28/2019  Body mass index is 26.56 kg/m.   HISTORY SECTION :  Chief Complaint  Patient presents with  . Knee Pain    Bilateral knee pain.   HPI The patient presents for evaluation of  (mild/moderate/severe/ ) severe pain, in the (right /left) left knee moderate pain right knee, for the last few weeks the left knee has gotten worse right knee has been hurting for a long time, associated with swelling and decreased range of motion.  Prior treatment none but she is on hydrocodone does not seem to be controlling her knee pain   Review of Systems  Constitutional: Negative for fever.  Respiratory: Positive for wheezing.   Cardiovascular: Negative for chest pain.     has a past medical history of Anxiety disorder, unspecified, Arteriosclerotic cardiovascular disease (ASCVD) (2006), Asthma, Chest pain, unspecified, Chronic kidney disease, unspecified, Chronic obstructive pulmonary disease, unspecified ([place]), Chronic pelvic pain in female, Colitis due to Clostridium difficile (1994), COPD (chronic obstructive pulmonary disease) ([place]), Depression with anxiety, Diarrhea, unspecified, Disorder of thyroid, unspecified, Gastro-esophageal reflux disease without esophagitis, Gastroesophageal reflux disease, Hyperlipidemia, Hyperlipidemia, unspecified, Hypertension, Hypertensive chronic kidney disease with stage 1 through stage 4 chronic kidney disease, or unspecified chronic kidney disease, Hyperthyroidism, Hypothyroidism, Hypothyroidism, unspecified, Kidney disease, chronic, stage III (moderate, EGFR 30-59 ml/min), Lower abdominal pain, unspecified, Major depressive disorder, single episode, severe without psychotic features ([place]), Major depressive disorder, single episode, unspecified, Nausea, Other postherpetic nervous system involvement, Pelvic and perineal pain, Rash and other nonspecific skin eruption, S/P colonoscopy (2007), S/P colonoscopy (12/13/10), S/P endoscopy (2009), S/P  endoscopy (05/10/10), Shingles, Tobacco abuse, in remission, Unspecified asthma, uncomplicated, Unspecified osteoarthritis, unspecified site, and Urinary tract infection, site not specified.   Past Surgical History:  Procedure Laterality Date  . ABDOMINAL HYSTERECTOMY    . BREAST LUMPECTOMY    . CATARACT EXTRACTION    . CHOLECYSTECTOMY  1962  . COLONOSCOPY  12/13/2010   Left-sided diverticulosis.  Cecal polyp, status post hot snare polypectomy/ Diminutive rectal polyp, status post cold biopsy removal tender/painful anal canal, ? occult anal fissure- Not visualized  . COLONOSCOPY N/A 10/14/2015   Diverticulosis  . ESOPHAGOGASTRODUODENOSCOPY  05/10/10   benign mucosa with mild chronic inflammation.  . ESOPHAGOGASTRODUODENOSCOPY (EGD) WITH PROPOFOL N/A 02/13/2019   Procedure: ESOPHAGOGASTRODUODENOSCOPY (EGD) WITH PROPOFOL;  Surgeon: Daneil Dolin, MD;  [facility];  Service: Endoscopy;  Laterality: N/A;  3:00pm  . FLEXIBLE SIGMOIDOSCOPY  12/29/2011   Procedure: FLEXIBLE SIGMOIDOSCOPY;  Surgeon: Rogene Houston, MD;  [facility];  Service: Endoscopy;  Laterality: N/A;  200-Ann notified pt to be here @ 1:00  . NISSEN FUNDOPLICATION    . YAG LASER APPLICATION Left 9/41/7408   Procedure: YAG LASER APPLICATION;  Surgeon: Williams Che, MD;  Location: [facility];  Service: Ophthalmology;  Laterality: Left;    Body mass index is 26.56 kg/m.   Allergies  Allergen Reactions  . Amoxicillin   . Hydrocodone-Acetaminophen   . Paroxetine Itching  . Sulfa Antibiotics Other (See Comments)    Childhood reaction.  . Venlafaxine Itching  . Nalbuphine Rash     Current Outpatient Medications:  .  albuterol (PROVENTIL HFA;VENTOLIN HFA) 108 (90 Base) MCG/ACT inhaler, Inhale 1-2 puffs into the lungs every 6 (six) hours as needed for wheezing or shortness of breath., Disp: , Rfl:  .  ALPRAZolam (XANAX) 1 MG tablet, Take 1 mg by mouth at bedtime as needed for anxiety., Disp: , Rfl:   .  alum & mag hydroxide-simeth (MAALOX/MYLANTA) 200-200-20 MG/5ML suspension, Take 30 mLs by mouth every 6 (six) hours as needed for indigestion or heartburn. , Disp: , Rfl:  .  atorvastatin (LIPITOR) 20 MG tablet, Take 20 mg every evening by mouth. , Disp: , Rfl:  .  benzonatate (TESSALON) 200 MG capsule, Take 200 mg by mouth 3 (three) times daily as needed for cough., Disp: , Rfl:  .  ESTRACE VAGINAL 0.1 MG/GM vaginal cream, Apply 1 application topically once a week. , Disp: , Rfl: 4 .  glycerin, Pediatric, 1.2 g SUPP, Place 1 suppository rectally daily as needed for moderate constipation. Fleet Laxative Suppository, Disp: , Rfl:  .  hydrochlorothiazide (HYDRODIURIL) 25 MG tablet, Take 25 mg by mouth daily., Disp: , Rfl:  .  HYDROcodone-acetaminophen (NORCO) 10-325 MG tablet, Take 0.5-2 tablets by mouth every 4 (four) hours as needed (1/2 tablet for mild pain, 1 tablet for moderate pain, 2 tablets for severe pain)., Disp: 80 tablet, Rfl: 0 .  hydroxypropyl methylcellulose (ISOPTO TEARS) 2.5 % ophthalmic solution, Place 1 drop into both eyes 3 (three) times daily as needed. For dry eyes., Disp: , Rfl:  .  levofloxacin (LEVAQUIN) 500 MG tablet, Take 500 mg by mouth daily., Disp: , Rfl:  .  levothyroxine (SYNTHROID) 75 MCG tablet, Take 75 mcg by mouth daily before breakfast. , Disp: , Rfl:  .  Multiple Vitamins-Minerals (EYE VITAMINS PO), Take 1 tablet by mouth daily. , Disp: , Rfl:  .  omeprazole (PRILOSEC) 40 MG capsule, Take 40 mg by mouth 2 (two) times daily before a meal., Disp: , Rfl:  .  ondansetron (ZOFRAN) 4 MG tablet, Take 1 tablet (4 mg total) by mouth 4 (four) times daily -  before meals and at bedtime. (Patient taking differently: Take 4 mg by mouth every 8 (eight) hours as needed for nausea or vomiting. ), Disp: 120 tablet, Rfl: 1 .  potassium chloride SA (K-DUR) 20 MEQ tablet, Take 20 mEq by mouth daily with lunch. , Disp: , Rfl:  .  prochlorperazine (COMPAZINE) 10 MG tablet, Take 5 mg  by mouth 2 (two) times daily as needed for nausea., Disp: , Rfl:  .  PROCTO-MED HC 2.5 % rectal cream, Apply 1 application topically daily as needed for hemorrhoids or itching. , Disp: , Rfl: 1 .  valACYclovir (VALTREX) 1000 MG tablet, Take 1 tablet (1,000 mg total) by mouth 3 (three) times daily., Disp: 30 tablet, Rfl: 0 .  verapamil (CALAN-SR) 180 MG CR tablet, Take 180 mg by mouth 2 (two) times daily. , Disp: , Rfl: 4 .  [place] (PREPARATION H EX), Apply 1 application topically 3 (three) times daily as needed (irritation). , Disp: , Rfl:  .  predniSONE (DELTASONE) 10 MG tablet, Take 4 tablets (40 mg total) by mouth daily with breakfast. (Patient not taking: Reported on 05/28/2019), Disp: 15 tablet, Rfl: 0   PHYSICAL EXAM SECTION: 1) BP (!) 177/89   Pulse 71   Temp (!) 97.4 F (36.3 C)   Ht 5' (1.524 m)   Wt 136 lb (61.7 kg)   BMI 26.56 kg/m   Body mass index is 26.56 kg/m. General appearance: Well-developed well-nourished no gross deformities  2) Cardiovascular normal pulse and perfusion , normal color   3) Neurologically deep tendon reflexes are equal and normal, no sensation loss or deficits no pathologic reflexes  4) Psychological: Awake alert and oriented x3 mood and affect normal  5) Skin no lacerations or ulcerations no nodularity  no palpable masses, no erythema or nodularity  6) Musculoskeletal:   She does use a cane to walk she has a limp she waddles  Medial joint line tenderness left knee and right knee both knees seem to have a good range of motion past 90 degrees of flexion without significant flexion contracture both knees are stable muscle tone seems normal   MEDICAL DECISION MAKING  A.  Encounter Diagnosis  Name Primary?  . Chronic pain of both knees Yes    B. DATA ANALYSED:  IMAGING: Independent interpretation of images: No  Internal x-rays see report bilateral osteoarthritis moderate to severe minimal deformity significant joint space narrowing no  significant osteophytes Orders: no  Outside records reviewed: no  C. MANAGEMENT recommend bilateral injections return in 3 months  Procedure note for bilateral knee injections  Procedure note left knee injection verbal consent was obtained to inject left knee joint  Timeout was completed to confirm the site of injection  The medications used were 40 mg of Depo-Medrol and 1% lidocaine 3 cc  Anesthesia was provided by ethyl chloride and the skin was prepped with alcohol.  After cleaning the skin with alcohol a 20-gauge needle was used to inject the left knee joint. There were no complications. A sterile bandage was applied.   Procedure note right knee injection verbal consent was obtained to inject right knee joint  Timeout was completed to confirm the site of injection  The medications used were 40 mg of Depo-Medrol and 1% lidocaine 3 cc  Anesthesia was provided by ethyl chloride and the skin was prepped with alcohol.  After cleaning the skin with alcohol a 20-gauge needle was used to inject the right knee joint. There were no complications. A sterile bandage was applied.    No orders of the defined types were placed in this encounter.     Arther Abbott, MD  05/28/2019 2:26 PM

## 2019-05-28 NOTE — Patient Instructions (Signed)

## 2019-05-29 ENCOUNTER — Telehealth: Payer: Self-pay | Admitting: Orthopedic Surgery

## 2019-05-29 NOTE — Telephone Encounter (Signed)
[name] called this morning stating that she had knee injections yesterday that her left knee is just "killing her."  She said it was hurting and she cant walk on it.   Please call her to advise what she can do.  Thanks

## 2019-05-29 NOTE — Telephone Encounter (Signed)
I called her to advise this is expected as I told her yesterday , encouraged her to use ice  She can not take NSAIDS she states she is using Hydrocodone.   Offered reassurance,told her with ice and rest should improve gradually over the next few days.

## 2019-06-02 ENCOUNTER — Telehealth: Payer: Self-pay | Admitting: Orthopedic Surgery

## 2019-06-02 NOTE — Telephone Encounter (Signed)
[name] called and again and states she is still hurting and doesnt know what to do.  She states she is icing it and staying off of it as previously advised.  Can you call her again to speak with her about this?

## 2019-06-03 NOTE — Telephone Encounter (Signed)
I have already advised her I called again to advise again use meds, ice your knees.   To Rexanne Mano, so you are aware of conversation

## 2019-06-03 NOTE — Telephone Encounter (Signed)
Noted  Closing note  

## 2019-06-03 NOTE — Telephone Encounter (Signed)
DONT NEED TO SEE HER Friday SHE HAS TO WAIT 2 WEEKS TO SEE IF THE SHOT HELPS SHES ON PAIN MEDICATION   USE THE MEDICATION SHE IS ON

## 2019-06-03 NOTE — Telephone Encounter (Signed)
Patient called back again relaying that she is still in a lot of pain with the left knee"inside of the knee" since visit 05/28/19 as noted.  Discussed possible appt Friday 06/13/19; please advise patient. Also aware she may go to Emergency room if feels she cannot tolerate the pain.

## 2019-06-04 DIAGNOSIS — Z79891 Long term (current) use of opiate analgesic: Secondary | ICD-10-CM | POA: Diagnosis not present

## 2019-06-07 ENCOUNTER — Other Ambulatory Visit: Payer: Self-pay

## 2019-06-07 ENCOUNTER — Emergency Department (HOSPITAL_COMMUNITY)
Admission: EM | Admit: 2019-06-07 | Discharge: 2019-06-07 | Disposition: A | Discharge status: Home / Self Care | Payer: Medicare Other | Attending: Emergency Medicine | Admitting: Emergency Medicine

## 2019-06-07 ENCOUNTER — Encounter (HOSPITAL_COMMUNITY): Payer: Self-pay | Admitting: Emergency Medicine

## 2019-06-07 ENCOUNTER — Emergency Department (HOSPITAL_COMMUNITY): Payer: Medicare Other

## 2019-06-07 DIAGNOSIS — M25561 Pain in right knee: Secondary | ICD-10-CM | POA: Insufficient documentation

## 2019-06-07 DIAGNOSIS — I129 Hypertensive chronic kidney disease with stage 1 through stage 4 chronic kidney disease, or unspecified chronic kidney disease: Secondary | ICD-10-CM | POA: Diagnosis not present

## 2019-06-07 DIAGNOSIS — E039 Hypothyroidism, unspecified: Secondary | ICD-10-CM | POA: Insufficient documentation

## 2019-06-07 DIAGNOSIS — J449 Chronic obstructive pulmonary disease, unspecified: Secondary | ICD-10-CM | POA: Diagnosis not present

## 2019-06-07 DIAGNOSIS — M1712 Unilateral primary osteoarthritis, left knee: Secondary | ICD-10-CM | POA: Diagnosis not present

## 2019-06-07 DIAGNOSIS — N3000 Acute cystitis without hematuria: Secondary | ICD-10-CM | POA: Diagnosis not present

## 2019-06-07 DIAGNOSIS — N183 Chronic kidney disease, stage 3 unspecified: Secondary | ICD-10-CM | POA: Insufficient documentation

## 2019-06-07 DIAGNOSIS — Z87891 Personal history of nicotine dependence: Secondary | ICD-10-CM | POA: Diagnosis not present

## 2019-06-07 DIAGNOSIS — M25562 Pain in left knee: Secondary | ICD-10-CM | POA: Diagnosis not present

## 2019-06-07 DIAGNOSIS — M1711 Unilateral primary osteoarthritis, right knee: Secondary | ICD-10-CM | POA: Diagnosis not present

## 2019-06-07 LAB — URINALYSIS, ROUTINE W REFLEX MICROSCOPIC
Bilirubin Urine: NEGATIVE
Glucose, UA: NEGATIVE mg/dL
Hgb urine dipstick: NEGATIVE
Ketones, ur: NEGATIVE mg/dL
Nitrite: NEGATIVE
Protein, ur: 100 mg/dL — AB
Specific Gravity, Urine: 1.019 (ref 1.005–1.030)
WBC, UA: >50 WBC/hpf — ABNORMAL HIGH (ref 0–5)
pH: 5 (ref 5.0–8.0)

## 2019-06-07 MED ORDER — CEPHALEXIN 500 MG PO CAPS
500.0000 mg | ORAL_CAPSULE | Freq: Once | ORAL | Status: AC
  Administered 2019-06-07: 500 mg via ORAL
  Filled 2019-06-07: qty 1

## 2019-06-07 MED ORDER — DICLOFENAC SODIUM 1 % EX GEL: 2.0000 g | Freq: Four times a day (QID) | CUTANEOUS | 0 refills | Status: DC | PRN

## 2019-06-07 MED ORDER — CEPHALEXIN 500 MG PO CAPS: 500.0000 mg | ORAL_CAPSULE | Freq: Three times a day (TID) | ORAL | 0 refills | Status: AC
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
## 2019-06-07 NOTE — ED Triage Notes (Signed)
C/o left knee pain, denies injury.  C/o urinary symptoms.

## 2019-06-07 NOTE — Discharge Instructions (Signed)
You were seen in the emergency department today with knee pain and urine symptoms.  You do have a urine infection and I am starting Keflex which you should take for the next 7 days.  You have received today's dose in the emergency department and can fill the prescription tomorrow morning.  You her knee x-rays show arthritis.  You recently had a pain medication prescribed on February 22nd.  I am prescribing a topical pain medication which you can take in addition to this prescribed medicine.  Please follow with your primary care doctor and return to the emergency department any new or suddenly worsening symptoms.

## 2019-06-07 NOTE — ED Triage Notes (Signed)
Took Hydrosone 7.5mg  at [facility] today.  Rating pain 10/10.

## 2019-06-10 ENCOUNTER — Ambulatory Visit: Payer: Medicare Other | Admitting: Gastroenterology

## 2019-06-10 LAB — URINE CULTURE: Culture: >=100000 — AB

## 2019-06-11 ENCOUNTER — Telehealth: Payer: Self-pay | Admitting: Emergency Medicine

## 2019-06-11 NOTE — Telephone Encounter (Signed)
Post ED Visit - Positive Culture Follow-up: Successful Patient Follow-Up  Culture assessed and recommendations reviewed by:  []  Elenor Quinones, Pharm.D. []  Heide Guile, Pharm.D., BCPS AQ-ID []  Parks Neptune, Pharm.D., BCPS []  Alycia Rossetti, Pharm.D., BCPS []  Guys, [place].D., BCPS, AAHIVP []  Legrand Como, Pharm.D., BCPS, AAHIVP []  Salome Arnt, PharmD, BCPS []  Johnnette Gourd, PharmD, BCPS []  Hughes Better, PharmD, BCPS []  Leeroy Cha, PharmD  Positive urine culture  []  Patient discharged without antimicrobial prescription and treatment is now indicated []  Organism is resistant to prescribed ED discharge antimicrobial []  Patient with positive blood cultures  Changes discussed with ED provider: Vanita Panda PA New antibiotic prescription stop keflex, symptom check, if + start Fosfomycin 3gr po x 1 dose , likely colonized Patient asymptomatic, stopped cephalexin d/t diarrhea     Hazle Nordmann 06/11/2019, 3:51 PM

## 2019-06-16 ENCOUNTER — Other Ambulatory Visit: Payer: Self-pay

## 2019-06-16 ENCOUNTER — Encounter: Payer: Self-pay | Admitting: Orthopedic Surgery

## 2019-06-16 ENCOUNTER — Ambulatory Visit (INDEPENDENT_AMBULATORY_CARE_PROVIDER_SITE_OTHER): Payer: Medicare Other | Admitting: Orthopedic Surgery

## 2019-06-16 ENCOUNTER — Ambulatory Visit: Payer: Medicare Other

## 2019-06-16 VITALS — Temp 97.3°F

## 2019-06-16 DIAGNOSIS — M130 Polyarthritis, unspecified: Secondary | ICD-10-CM | POA: Diagnosis not present

## 2019-06-16 DIAGNOSIS — M545 Low back pain, unspecified: Secondary | ICD-10-CM

## 2019-06-16 DIAGNOSIS — Z79891 Long term (current) use of opiate analgesic: Secondary | ICD-10-CM | POA: Diagnosis not present

## 2019-06-16 DIAGNOSIS — M79605 Pain in left leg: Secondary | ICD-10-CM

## 2019-06-16 DIAGNOSIS — E785 Hyperlipidemia, unspecified: Secondary | ICD-10-CM | POA: Diagnosis not present

## 2019-06-16 MED ORDER — ONDANSETRON HCL 4 MG PO TABS: 4.0000 mg | ORAL_TABLET | Freq: Three times a day (TID) | ORAL | 2 refills | Status: DC | PRN

## 2019-06-16 MED ORDER — GABAPENTIN 100 MG PO CAPS: 100.0000 mg | ORAL_CAPSULE | Freq: Three times a day (TID) | ORAL | 2 refills | Status: DC

## 2019-06-16 MED ORDER — METHYLPREDNISOLONE ACETATE 40 MG/ML IJ SUSP
40.0000 mg | Freq: Once | INTRAMUSCULAR | Status: AC
  Administered 2019-06-16: 40 mg via INTRAMUSCULAR

## 2019-06-16 NOTE — Addendum Note (Signed)
Addended byCandice Camp on: 06/16/2019 10:17 AM   Modules accepted: Orders

## 2019-06-16 NOTE — Progress Notes (Signed)
Chief Complaint  Patient presents with  . Knee Pain    Recheck on left knee.    X-rays on 13 March 2 views of the right knee 2 views of the left knee left knee shows moderate to severe narrowing medial compartment right knee shows mild to moderate narrowing of the medial compartment  No acute process noted  I saw her on March 3 injected both knees  She went to the emergency room 10 days later had repeat films as I stated I saw both films and look the same as the films I had here  She does have pain over the medial joint line of both knees she is symptomatic there with palpation of the joint however, her imaging studies did not look terribly bad and she complains of pain in her left leg hip with tingling in her foot  She says the pain medication (hydrocodone 10 mg) does not help the pain  Past Medical History:  Diagnosis Date  . Anxiety disorder, unspecified   . Arteriosclerotic cardiovascular disease (ASCVD) 2006   Nonobstructive; < 50% lesions on cath 2002; negative stress nuclear study in 10/2004  . Asthma   . Chest pain, unspecified   . Chronic kidney disease, unspecified   . Chronic obstructive pulmonary disease, unspecified ([place])   . Chronic pelvic pain in female   . Colitis due to Clostridium difficile [place]  . COPD (chronic obstructive pulmonary disease) ([place])   . Depression with anxiety   . Diarrhea, unspecified   . Disorder of thyroid, unspecified   . Gastro-esophageal reflux disease without esophagitis   . Gastroesophageal reflux disease   . Hyperlipidemia   . Hyperlipidemia, unspecified   . Hypertension   . Hypertensive chronic kidney disease with stage 1 through stage 4 chronic kidney disease, or unspecified chronic kidney disease   . Hyperthyroidism   . Hypothyroidism   . Hypothyroidism, unspecified   . Kidney disease, chronic, stage III (moderate, EGFR 30-59 ml/min)   . Lower abdominal pain, unspecified   . Major depressive disorder, single episode,  severe without psychotic features ([place])   . Major depressive disorder, single episode, unspecified   . Nausea   . Other postherpetic nervous system involvement   . Pelvic and perineal pain   . Rash and other nonspecific skin eruption   . S/P colonoscopy 2007   few diverticula, otherwise nl  . S/P colonoscopy 12/13/10   rectal, cecal polyp, left-sided diverticulosis, hyperplastic. ?anal fissure? treated empirically  . S/P endoscopy 2009   linear esophageal erosions  . S/P endoscopy 05/10/10   Question island of salmon-colored epithelium in distal esophagus ; no Barrett's.   . Shingles   . Tobacco abuse, in remission    55-pack-year consumption; discontinued 08/2010  . Unspecified asthma, uncomplicated   . Unspecified osteoarthritis, unspecified site   . Urinary tract infection, site not specified     Other review of systems no current chest pain or shortness of breath  Temp (!) 97.3 F (36.3 C)   Could not get any other vitals because she is in a wheelchair cannot walk well  She has normal appearance she is oriented x3 her mood is pleasant her affect is normal again her gait is marked by significant pain  Right knee tender medial joint line mild she has a good range of motion knee is stable strength is normal skin is intact  There is no effusion  Left knee more tenderness than the right no effusion skin is  normal strength is excellent stability tests are good range of motion is normal  Her back is tender midline and left side including left SI joint left buttock  Reflexes are intact  X-rays lumbar spine  X-rays independently read 2 views right knee 2 views left knee see above  I took a lumbar spine view she has a grade 2 spondylolisthesis of L4 on 5 osteopenia with coronal plane malalignment and spondylosis  Recommend intramuscular injection left hip and start gabapentin 100 mg 3 times a day  Follow-up in 2 weeks  IM injection left hip verbal consent site confirmation  40 mg Depo-Medrol injected no complications  Encounter Diagnoses  Name Primary?  . Lumbar pain   . Pain in left leg Yes    Meds ordered this encounter  Medications  . gabapentin (NEURONTIN) 100 MG capsule    Sig: Take 1 capsule (100 mg total) by mouth 3 (three) times daily.    Dispense:  90 capsule    Refill:  2  . ondansetron (ZOFRAN) 4 MG tablet    Sig: Take 1 tablet (4 mg total) by mouth every 8 (eight) hours as needed for nausea or vomiting.    Dispense:  60 tablet    Refill:  2

## 2019-07-04 ENCOUNTER — Other Ambulatory Visit: Payer: Self-pay

## 2019-07-04 ENCOUNTER — Encounter (HOSPITAL_COMMUNITY): Payer: Self-pay | Admitting: *Deleted

## 2019-07-04 ENCOUNTER — Emergency Department (HOSPITAL_COMMUNITY)
Admission: EM | Admit: 2019-07-04 | Discharge: 2019-07-04 | Disposition: A | Discharge status: Home / Self Care | Payer: Medicare Other | Attending: Emergency Medicine | Admitting: Emergency Medicine

## 2019-07-04 ENCOUNTER — Emergency Department (HOSPITAL_COMMUNITY): Payer: Medicare Other

## 2019-07-04 DIAGNOSIS — Y929 Unspecified place or not applicable: Secondary | ICD-10-CM | POA: Insufficient documentation

## 2019-07-04 DIAGNOSIS — I2510 Atherosclerotic heart disease of native coronary artery without angina pectoris: Secondary | ICD-10-CM | POA: Insufficient documentation

## 2019-07-04 DIAGNOSIS — S59911A Unspecified injury of right forearm, initial encounter: Secondary | ICD-10-CM | POA: Diagnosis not present

## 2019-07-04 DIAGNOSIS — Z9049 Acquired absence of other specified parts of digestive tract: Secondary | ICD-10-CM | POA: Diagnosis not present

## 2019-07-04 DIAGNOSIS — J449 Chronic obstructive pulmonary disease, unspecified: Secondary | ICD-10-CM | POA: Diagnosis not present

## 2019-07-04 DIAGNOSIS — N183 Chronic kidney disease, stage 3 unspecified: Secondary | ICD-10-CM | POA: Insufficient documentation

## 2019-07-04 DIAGNOSIS — E039 Hypothyroidism, unspecified: Secondary | ICD-10-CM | POA: Insufficient documentation

## 2019-07-04 DIAGNOSIS — Y999 Unspecified external cause status: Secondary | ICD-10-CM | POA: Insufficient documentation

## 2019-07-04 DIAGNOSIS — Z79899 Other long term (current) drug therapy: Secondary | ICD-10-CM | POA: Insufficient documentation

## 2019-07-04 DIAGNOSIS — J45909 Unspecified asthma, uncomplicated: Secondary | ICD-10-CM | POA: Insufficient documentation

## 2019-07-04 DIAGNOSIS — Y939 Activity, unspecified: Secondary | ICD-10-CM | POA: Diagnosis not present

## 2019-07-04 DIAGNOSIS — Z87891 Personal history of nicotine dependence: Secondary | ICD-10-CM | POA: Diagnosis not present

## 2019-07-04 DIAGNOSIS — I129 Hypertensive chronic kidney disease with stage 1 through stage 4 chronic kidney disease, or unspecified chronic kidney disease: Secondary | ICD-10-CM | POA: Diagnosis not present

## 2019-07-04 DIAGNOSIS — T7491XA Unspecified adult maltreatment, confirmed, initial encounter: Secondary | ICD-10-CM

## 2019-07-04 DIAGNOSIS — S4991XA Unspecified injury of right shoulder and upper arm, initial encounter: Secondary | ICD-10-CM
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
## 2019-07-06 NOTE — Progress Notes (Signed)
Chief Complaint  Patient presents with  . Follow-up    Recheck on back and leg pain.    Encounter Diagnoses  Name Primary?  . Lumbar pain Yes  . Pain in left leg   . Chronic pain of both knees    Gene that she would like to shots in her knee  She would also like a shot in her left gluteal area  She is on chronic hydrocodone 12/28/2023 I put her on gabapentin 103 times a day she says she walks much better although she still has some medial knee pain  She is very unaware of her overall health.  She has arthrosclerotic cardiovascular disease chronic obstructive pulmonary disease hypothyroidism  Past Medical History:  Diagnosis Date  . Anxiety disorder, unspecified   . Arteriosclerotic cardiovascular disease (ASCVD) 2006   Nonobstructive; < 50% lesions on cath 2002; negative stress nuclear study in 10/2004  . Asthma   . Chest pain, unspecified   . Chronic kidney disease, unspecified   . Chronic obstructive pulmonary disease, unspecified ([place])   . Chronic pelvic pain in female   . Colitis due to Clostridium difficile [place]  . COPD (chronic obstructive pulmonary disease) ([place])   . Depression with anxiety   . Diarrhea, unspecified   . Disorder of thyroid, unspecified   . Gastro-esophageal reflux disease without esophagitis   . Gastroesophageal reflux disease   . Hyperlipidemia   . Hyperlipidemia, unspecified   . Hypertension   . Hypertensive chronic kidney disease with stage 1 through stage 4 chronic kidney disease, or unspecified chronic kidney disease   . Hyperthyroidism   . Hypothyroidism   . Hypothyroidism, unspecified   . Kidney disease, chronic, stage III (moderate, EGFR 30-59 ml/min)   . Lower abdominal pain, unspecified   . Major depressive disorder, single episode, severe without psychotic features ([place])   . Major depressive disorder, single episode, unspecified   . Nausea   . Other postherpetic nervous system involvement   . Pelvic and perineal pain   . Rash  and other nonspecific skin eruption   . S/P colonoscopy 2007   few diverticula, otherwise nl  . S/P colonoscopy 12/13/10   rectal, cecal polyp, left-sided diverticulosis, hyperplastic. ?anal fissure? treated empirically  . S/P endoscopy 2009   linear esophageal erosions  . S/P endoscopy 05/10/10   Question island of salmon-colored epithelium in distal esophagus ; no Barrett's.   . Shingles   . Tobacco abuse, in remission    55-pack-year consumption; discontinued 08/2010  . Unspecified asthma, uncomplicated   . Unspecified osteoarthritis, unspecified site   . Urinary tract infection, site not specified     Ms. Beirne comes in after intramuscular injection for right leg pain.  She had been on hydrocodone 10 mg for pain in her knees which did not help her pain she also got 2 injections which did not help  She is not a good surgical candidate she has heart disease hypothyroidism depression chronic kidney disease anxiety disorder  She is here today with possibility of needing MRI if the injections and gabapentin did not help her leg pain  lumbar spine view she has a grade 2 spondylolisthesis of L4 on 5 osteopenia with coronal plane malalignment and spondylosis    I injected both knees and then the left hip  She is to continue her gabapentin  Follow-up in a month  Encounter Diagnoses  Name Primary?  . Lumbar pain Yes  . Pain in left leg   .  Chronic pain of both knees     Procedure note for bilateral knee injections  Procedure note left knee injection verbal consent was obtained to inject left knee joint  Timeout was completed to confirm the site of injection  The medications used were 40 mg of Depo-Medrol and 1% lidocaine 3 cc  Anesthesia was provided by ethyl chloride and the skin was prepped with alcohol.  After cleaning the skin with alcohol a 20-gauge needle was used to inject the left knee joint. There were no complications. A sterile bandage was applied.   Procedure  note right knee injection verbal consent was obtained to inject right knee joint  Timeout was completed to confirm the site of injection  The medications used were 40 mg of Depo-Medrol and 1% lidocaine 3 cc  Anesthesia was provided by ethyl chloride and the skin was prepped with alcohol.  After cleaning the skin with alcohol a 20-gauge needle was used to inject the right knee joint. There were no complications. A sterile bandage was applied.  Intramuscular injection procedure note   The patient has complained of pain in the left lower back and hip  We have decided that the best treatment option to give the patient pain relief is to do an intramuscular injection of Depo-Medrol   The patient gave verbal consent, timeout was taken as appropriate. I injected 40 mg of Depo-Medrol Gluteal to the SI region

## 2019-07-07 ENCOUNTER — Other Ambulatory Visit: Payer: Self-pay

## 2019-07-07 ENCOUNTER — Ambulatory Visit (INDEPENDENT_AMBULATORY_CARE_PROVIDER_SITE_OTHER): Payer: Medicare Other | Admitting: Orthopedic Surgery

## 2019-07-07 VITALS — BP 128/86 | HR 76 | Temp 97.9°F | Ht 60.0 in | Wt 136.0 lb

## 2019-07-07 DIAGNOSIS — G8929 Other chronic pain: Secondary | ICD-10-CM | POA: Diagnosis not present

## 2019-07-07 DIAGNOSIS — M545 Low back pain, unspecified: Secondary | ICD-10-CM

## 2019-07-07 DIAGNOSIS — M25562 Pain in left knee: Secondary | ICD-10-CM

## 2019-07-07 DIAGNOSIS — M79605 Pain in left leg: Secondary | ICD-10-CM | POA: Diagnosis not present

## 2019-07-07 DIAGNOSIS — M25561 Pain in right knee: Secondary | ICD-10-CM | POA: Diagnosis not present

## 2019-07-07 NOTE — Patient Instructions (Signed)
Follow-up in a month you have arthritis in both knees and spinal stenosis continue to take your gabapentin  You have received an injection of steroids into the joint. 15% of patients will have increased pain within the 24 hours postinjection.   This is transient and will go away.   We recommend that you use ice packs on the injection site for 20 minutes every 2 hours and extra strength Tylenol 2 tablets every 8 as needed until the pain resolves.  If you continue to have pain after taking the Tylenol and using the ice please call the office for further instructions.

## 2019-07-15 DIAGNOSIS — Z79891 Long term (current) use of opiate analgesic: Secondary | ICD-10-CM | POA: Diagnosis not present

## 2019-07-17 ENCOUNTER — Ambulatory Visit (INDEPENDENT_AMBULATORY_CARE_PROVIDER_SITE_OTHER): Payer: Medicare Other | Admitting: Student

## 2019-07-17 ENCOUNTER — Other Ambulatory Visit: Payer: Self-pay

## 2019-07-17 ENCOUNTER — Encounter: Payer: Self-pay | Admitting: Student

## 2019-07-17 VITALS — BP 188/96 | HR 62 | Temp 98.2°F | Ht 60.0 in | Wt 145.2 lb

## 2019-07-17 DIAGNOSIS — I2510 Atherosclerotic heart disease of native coronary artery without angina pectoris: Secondary | ICD-10-CM | POA: Diagnosis not present

## 2019-07-17 DIAGNOSIS — I10 Essential (primary) hypertension: Secondary | ICD-10-CM

## 2019-07-17 DIAGNOSIS — R002 Palpitations: Secondary | ICD-10-CM

## 2019-07-17 DIAGNOSIS — E782 Mixed hyperlipidemia: Secondary | ICD-10-CM | POA: Diagnosis not present

## 2019-07-17 DIAGNOSIS — I493 Ventricular premature depolarization: Secondary | ICD-10-CM | POA: Diagnosis not present

## 2019-07-17 MED ORDER — DILTIAZEM HCL ER COATED BEADS 180 MG PO CP24: 180.0000 mg | ORAL_CAPSULE | Freq: Every day | ORAL | 6 refills | Status: DC

## 2019-07-17 NOTE — Patient Instructions (Signed)
Medication Instructions:  STOP VERAPAMIL  START CARDIZEM CD 180mg  DAILY  Labwork:  CBC, BMET, TSH and Mg  Testing/Procedures:  None  Follow-Up:  With [place], PA-C or Dr. Harrington Challenger in 4-6 weeks.   Any Other Special Instructions Will Be Listed Below (If Applicable).     If you need a refill on your cardiac medications before your next appointment, please call your pharmacy.
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
## 2019-07-21 ENCOUNTER — Telehealth: Payer: Self-pay

## 2019-07-21 NOTE — Telephone Encounter (Signed)
Pt concerned BP up and down.   Please call [phone]   Thanks renee

## 2019-07-21 NOTE — Telephone Encounter (Signed)
Concerned that since verapamil was changed to diltiazem, her HR is "irregular" and BP is 170/89. I asked her to re-check her BP :122/99, HR 69    She is going to recheck Pb in am

## 2019-07-21 NOTE — Telephone Encounter (Signed)
Noted  

## 2019-07-21 NOTE — Telephone Encounter (Signed)
Pt is calling back concerned about medications   plesae call [phone]

## 2019-07-21 NOTE — Telephone Encounter (Signed)
    We just switched to the new medication 4 days ago. It can take 2 weeks to see the effect so I would encourage her to keep a BP/HR log. If readings remain elevated, we can titrate the dosing.   Signed, Erma Heritage, PA-C 07/21/2019, 4:49 PM Pager: [phone]

## 2019-07-22 ENCOUNTER — Telehealth: Payer: Self-pay

## 2019-07-22 NOTE — Telephone Encounter (Signed)
Pt returning call as instructed with  BP last night(07-21-19) was 160/90 HR 68 BP this morn(07-22-19) was 157/87 HR 56   Please call  [phone]   Thanks renee

## 2019-07-22 NOTE — Telephone Encounter (Signed)
Pt will call back in 1 week with BP readings

## 2019-07-22 NOTE — Telephone Encounter (Signed)
   Please refer to phone note from yesterday as it has still only been 5 days. While her BP is not at goal, it has significantly improved from her office visit as readings were 210/100 and 188/96 at that time. Have her continue to follow readings for at least 1 week and then report back and can make changes if indicated.   Signed, Erma Heritage, PA-C 07/22/2019, 10:02 AM Pager: [phone]

## 2019-07-24 ENCOUNTER — Other Ambulatory Visit (HOSPITAL_COMMUNITY)
Admission: RE | Admit: 2019-07-24 | Discharge: 2019-07-24 | Disposition: A | Discharge status: Home / Self Care | Payer: Medicare Other | Source: Ambulatory Visit | Attending: Student | Admitting: Student

## 2019-07-24 DIAGNOSIS — R002 Palpitations: Secondary | ICD-10-CM | POA: Insufficient documentation

## 2019-07-24 LAB — BASIC METABOLIC PANEL
Anion gap: 10 (ref 5–15)
BUN: 36 mg/dL — ABNORMAL HIGH (ref 8–23)
CO2: 24 mmol/L (ref 22–32)
Calcium: 8.8 mg/dL — ABNORMAL LOW (ref 8.9–10.3)
Chloride: 106 mmol/L (ref 98–111)
Creatinine, Ser: 1.7 mg/dL — ABNORMAL HIGH (ref 0.44–1.00)
GFR calc Af Amer: 32 mL/min — ABNORMAL LOW (ref >60)
GFR calc non Af Amer: 28 mL/min — ABNORMAL LOW (ref >60)
Glucose, Bld: 93 mg/dL (ref 70–99)
Potassium: 4.4 mmol/L (ref 3.5–5.1)
Sodium: 140 mmol/L (ref 135–145)

## 2019-07-24 LAB — CBC
HCT: 36.6 % (ref 36.0–46.0)
Hemoglobin: 11.7 g/dL — ABNORMAL LOW (ref 12.0–15.0)
MCH: 29.8 pg (ref 26.0–34.0)
MCHC: 32 g/dL (ref 30.0–36.0)
MCV: 93.1 fL (ref 80.0–100.0)
Platelets: 263 10*3/uL (ref 150–400)
RBC: 3.93 MIL/uL (ref 3.87–5.11)
RDW: 16.6 % — ABNORMAL HIGH (ref 11.5–15.5)
WBC: 8 10*3/uL (ref 4.0–10.5)
nRBC: 0 % (ref 0.0–0.2)

## 2019-07-24 LAB — MAGNESIUM: Magnesium: 2.1 mg/dL (ref 1.7–2.4)

## 2019-07-24 LAB — TSH: TSH: 5.378 u[IU]/mL — ABNORMAL HIGH (ref 0.350–4.500)

## 2019-07-25 ENCOUNTER — Telehealth: Payer: Self-pay

## 2019-07-25 NOTE — Telephone Encounter (Signed)
I gave her her lab results and she will call pcp regarding TSH and synthroid dosing.She continues to monitor her BP daily

## 2019-07-25 NOTE — Telephone Encounter (Signed)
Pt concerned with BP readings and HR   Please call [phone]   Thanks renee

## 2019-08-02 ENCOUNTER — Other Ambulatory Visit: Payer: Self-pay | Admitting: Gastroenterology

## 2019-08-06 ENCOUNTER — Ambulatory Visit (INDEPENDENT_AMBULATORY_CARE_PROVIDER_SITE_OTHER): Payer: Medicare Other | Admitting: Orthopedic Surgery

## 2019-08-06 ENCOUNTER — Other Ambulatory Visit: Payer: Self-pay

## 2019-08-06 ENCOUNTER — Encounter: Payer: Self-pay | Admitting: Orthopedic Surgery

## 2019-08-06 VITALS — BP 169/80 | HR 61 | Ht 60.0 in | Wt 145.0 lb

## 2019-08-06 DIAGNOSIS — M25561 Pain in right knee: Secondary | ICD-10-CM

## 2019-08-06 DIAGNOSIS — E039 Hypothyroidism, unspecified: Secondary | ICD-10-CM | POA: Diagnosis not present

## 2019-08-06 DIAGNOSIS — G8929 Other chronic pain: Secondary | ICD-10-CM

## 2019-08-06 DIAGNOSIS — I10 Essential (primary) hypertension: Secondary | ICD-10-CM | POA: Diagnosis not present

## 2019-08-06 DIAGNOSIS — M545 Low back pain, unspecified: Secondary | ICD-10-CM

## 2019-08-06 DIAGNOSIS — R002 Palpitations: Secondary | ICD-10-CM | POA: Diagnosis not present

## 2019-08-06 DIAGNOSIS — M25562 Pain in left knee: Secondary | ICD-10-CM

## 2019-08-06 MED ORDER — METHYLPREDNISOLONE ACETATE 40 MG/ML IJ SUSP
40.0000 mg | Freq: Once | INTRAMUSCULAR | Status: AC
  Administered 2019-08-06: 40 mg via INTRAMUSCULAR

## 2019-08-06 NOTE — Progress Notes (Signed)
Chief Complaint  Patient presents with  . Medication Refill    Gabapentin refill to [facility]     Encounter Diagnoses  Name Primary?  . Lumbar pain Yes  . Chronic pain of both knees     She is doing well with gabapentin but complains of some lower back pain and left gluteal pain requesting injection  Both of her knees are very flexible with good range of motion normal strength no effusion mild tenderness over the medial joint line  Intramuscular injection left hip by nurse  40 mg Depo-Medrol  Patient gave consent site was confirmed injection was done.

## 2019-08-18 ENCOUNTER — Other Ambulatory Visit: Payer: Self-pay | Admitting: Orthopedic Surgery

## 2019-08-18 DIAGNOSIS — M79605 Pain in left leg: Secondary | ICD-10-CM

## 2019-08-18 DIAGNOSIS — M545 Low back pain, unspecified: Secondary | ICD-10-CM

## 2019-08-22 ENCOUNTER — Encounter: Payer: Self-pay | Admitting: Student

## 2019-08-22 ENCOUNTER — Other Ambulatory Visit: Payer: Self-pay

## 2019-08-22 ENCOUNTER — Ambulatory Visit (INDEPENDENT_AMBULATORY_CARE_PROVIDER_SITE_OTHER): Payer: Medicare Other | Admitting: Student

## 2019-08-22 ENCOUNTER — Ambulatory Visit (INDEPENDENT_AMBULATORY_CARE_PROVIDER_SITE_OTHER): Payer: Medicare Other

## 2019-08-22 VITALS — BP 176/74 | HR 58 | Ht 60.0 in | Wt 144.0 lb

## 2019-08-22 DIAGNOSIS — R002 Palpitations: Secondary | ICD-10-CM

## 2019-08-22 DIAGNOSIS — N183 Chronic kidney disease, stage 3 unspecified: Secondary | ICD-10-CM | POA: Diagnosis not present

## 2019-08-22 DIAGNOSIS — I2510 Atherosclerotic heart disease of native coronary artery without angina pectoris: Secondary | ICD-10-CM

## 2019-08-22 DIAGNOSIS — I10 Essential (primary) hypertension: Secondary | ICD-10-CM | POA: Diagnosis not present

## 2019-08-22 DIAGNOSIS — E782 Mixed hyperlipidemia: Secondary | ICD-10-CM

## 2019-08-22 MED ORDER — CARVEDILOL 6.25 MG PO TABS: 6.2500 mg | ORAL_TABLET | Freq: Two times a day (BID) | ORAL | 3 refills | Status: DC

## 2019-08-22 NOTE — Progress Notes (Signed)
Cardiology Office Note    Date:  08/23/2019   ID:  Jessica, [name] 1935-07-10, MRN [id number]  PCP:  Jani Gravel, MD  Cardiologist: Dorris Carnes, MD    Chief Complaint  Patient presents with  . Follow-up    1 month visit    History of Present Illness:    [name] is a 84 y.o. female with past medical history of HTN, HLD, CAD (nonobstructive disease by catheterization in 2002), palpitations (PVC's by prior EKG), Stage III CKD and COPD who presents to the office today for 1 month follow-up.  She was examined by myself on 07/17/2019 reported palpitations on a daily basis but denied any associated symptoms. She reported compliance with Verapamil 300 mg daily but felt like symptoms were not improving. BP was elevated at 210/100 on initial check, improved to 188/96 on repeat. Verapamil was discontinued and she was switched to Cardizem CD 180 mg daily. Routine labs were obtained as well to assess for any abnormalities. Follow-up labs did show creatinine consistent with Stage III CKD but electrolytes were within a normal range. TSH was elevated to 5.378 and she was informed to follow-up with her PCP in regards to Synthroid dosing.   In talking with the patient today, she reports that switching to Cardizem CD did not help with her palpitations. Symptoms can occur throughout the day and are typically worse at night. She was evaluated by her PCP and discussions were held about starting a medication for her anxiety.  She reports associated dyspnea when she develops palpitations. No specific chest pain, orthopnea, PND or edema.   Past Medical History:  Diagnosis Date  . Anxiety disorder, unspecified   . Arteriosclerotic cardiovascular disease (ASCVD) 2006   Nonobstructive; < 50% lesions on cath 2002; negative stress nuclear study in 10/2004  . Asthma   . Chest pain, unspecified   . Chronic kidney disease, unspecified   . Chronic obstructive pulmonary disease, unspecified ([place])   . Chronic  pelvic pain in female   . Colitis due to Clostridium difficile [place]  . COPD (chronic obstructive pulmonary disease) ([place])   . Depression with anxiety   . Diarrhea, unspecified   . Disorder of thyroid, unspecified   . Gastro-esophageal reflux disease without esophagitis   . Gastroesophageal reflux disease   . Hyperlipidemia   . Hyperlipidemia, unspecified   . Hypertension   . Hypertensive chronic kidney disease with stage 1 through stage 4 chronic kidney disease, or unspecified chronic kidney disease   . Hyperthyroidism   . Hypothyroidism   . Hypothyroidism, unspecified   . Kidney disease, chronic, stage III (moderate, EGFR 30-59 ml/min)   . Lower abdominal pain, unspecified   . Major depressive disorder, single episode, severe without psychotic features ([place])   . Major depressive disorder, single episode, unspecified   . Nausea   . Other postherpetic nervous system involvement   . Pelvic and perineal pain   . Rash and other nonspecific skin eruption   . S/P colonoscopy 2007   few diverticula, otherwise nl  . S/P colonoscopy 12/13/10   rectal, cecal polyp, left-sided diverticulosis, hyperplastic. ?anal fissure? treated empirically  . S/P endoscopy 2009   linear esophageal erosions  . S/P endoscopy 05/10/10   Question island of salmon-colored epithelium in distal esophagus ; no Barrett's.   . Shingles   . Tobacco abuse, in remission    55-pack-year consumption; discontinued 08/2010  . Unspecified asthma, uncomplicated   . Unspecified osteoarthritis, unspecified  site   . Urinary tract infection, site not specified     Past Surgical History:  Procedure Laterality Date  . ABDOMINAL HYSTERECTOMY    . BREAST LUMPECTOMY    . CATARACT EXTRACTION    . CHOLECYSTECTOMY  1962  . COLONOSCOPY  12/13/2010   Left-sided diverticulosis.  Cecal polyp, status post hot snare polypectomy/ Diminutive rectal polyp, status post cold biopsy removal tender/painful anal canal, ? occult anal  fissure- Not visualized  . COLONOSCOPY N/A 10/14/2015   Diverticulosis  . ESOPHAGOGASTRODUODENOSCOPY  05/10/10   benign mucosa with mild chronic inflammation.  . ESOPHAGOGASTRODUODENOSCOPY (EGD) WITH PROPOFOL N/A 02/13/2019   Procedure: ESOPHAGOGASTRODUODENOSCOPY (EGD) WITH PROPOFOL;  Surgeon: Daneil Dolin, MD;  Location: [facility];  Service: Endoscopy;  Laterality: N/A;  3:00pm  . FLEXIBLE SIGMOIDOSCOPY  12/29/2011   Procedure: FLEXIBLE SIGMOIDOSCOPY;  Surgeon: Rogene Houston, MD;  Location: [facility];  Service: Endoscopy;  Laterality: N/A;  200-Ann notified pt to be here @ 1:00  . NISSEN FUNDOPLICATION    . YAG LASER APPLICATION Left 05/20/8248   Procedure: YAG LASER APPLICATION;  Surgeon: Williams Che, MD;  Location: [facility];  Service: Ophthalmology;  Laterality: Left;    Current Medications: Outpatient Medications Prior to Visit  Medication Sig Dispense Refill  . albuterol (PROVENTIL HFA;VENTOLIN HFA) 108 (90 Base) MCG/ACT inhaler Inhale 1-2 puffs into the lungs every 6 (six) hours as needed for wheezing or shortness of breath.    Marland Kitchen atorvastatin (LIPITOR) 20 MG tablet Take 20 mg every evening by mouth.     Adora Fridge VAGINAL 0.1 MG/GM vaginal cream Apply 1 application topically once a week.   4  . gabapentin (NEURONTIN) 100 MG capsule TAKE 1 CAPSULE BY MOUTH THREE TIMES A DAY. 90 capsule 0  . hydrochlorothiazide (HYDRODIURIL) 25 MG tablet Take 25 mg by mouth daily.    Marland Kitchen HYDROcodone-acetaminophen (NORCO) 10-325 MG tablet Take 0.5-2 tablets by mouth every 4 (four) hours as needed (1/2 tablet for mild pain, 1 tablet for moderate pain, 2 tablets for severe pain). 80 tablet 0  . hydroxypropyl methylcellulose (ISOPTO TEARS) 2.5 % ophthalmic solution Place 1 drop into both eyes 3 (three) times daily as needed. For dry eyes.    Marland Kitchen levothyroxine (SYNTHROID) 100 MCG tablet Take 100 mcg by mouth daily before breakfast.    . LINZESS 145 MCG CAPS capsule TAKE 1 CAPSULE ONCE DAILY BEFORE  BREAKFAST. 90 capsule 3  . Multiple Vitamins-Minerals (EYE VITAMINS PO) Take 1 tablet by mouth daily.     Marland Kitchen omeprazole (PRILOSEC) 40 MG capsule Take 40 mg by mouth 2 (two) times daily before a meal.    . ondansetron (ZOFRAN) 4 MG tablet Take 1 tablet (4 mg total) by mouth every 8 (eight) hours as needed for nausea or vomiting. 60 tablet 2  . potassium chloride SA (K-DUR) 20 MEQ tablet Take 20 mEq by mouth daily with lunch.     . diltiazem (CARDIZEM CD) 180 MG 24 hr capsule Take 1 capsule (180 mg total) by mouth daily. 30 capsule 6  . levothyroxine (SYNTHROID) 75 MCG tablet Take 75 mcg by mouth daily before breakfast.      No facility-administered medications prior to visit.     Allergies:   Amoxicillin, Hydralazine, Hydrocodone-acetaminophen, Paroxetine, Sulfa antibiotics, Venlafaxine, and Nalbuphine   Social History   Socioeconomic History  . Marital status: Married    Spouse name: Not on file  . Number of children: 4  . Years of education:  Not on file  . Highest education level: Not on file  Occupational History  . Not on file  Tobacco Use  . Smoking status: Former Smoker    Packs/day: 1.00    Years: 55.00    Pack years: 55.00    Quit date: 09/15/2010    Years since quitting: 8.9  . Smokeless tobacco: Never Used  Substance and Sexual Activity  . Alcohol use: No    Alcohol/week: 0.0 standard drinks  . Drug use: No  . Sexual activity: Not Currently  Other Topics Concern  . Not on file  Social History Narrative  . Not on file   Social Determinants of Health   Financial Resource Strain:   . Difficulty of Paying Living Expenses:   Food Insecurity:   . Worried About Charity fundraiser in the Last Year:   . Arboriculturist in the Last Year:   Transportation Needs:   . Film/video editor (Medical):   Marland Kitchen Lack of Transportation (Non-Medical):   Physical Activity:   . Days of Exercise per Week:   . Minutes of Exercise per Session:   Stress:   . Feeling of Stress :     Social Connections:   . Frequency of Communication with Friends and Family:   . Frequency of Social Gatherings with Friends and Family:   . Attends Religious Services:   . Active Member of Clubs or Organizations:   . Attends Archivist Meetings:   Marland Kitchen Marital Status:      Family History:  The patient's family history includes Alzheimer's disease in her mother; Aneurysm in her son; Cancer in her mother and sister; Diabetes in her mother and son; Heart disease in her brother and father; Hypertension in her brother.   Review of Systems:   Please see the history of present illness.     General:  No chills, fever, night sweats or weight changes.  Cardiovascular:  No chest pain, edema, orthopnea, paroxysmal nocturnal dyspnea. Positive for palpitations and dyspnea.  Dermatological: No rash, lesions/masses Respiratory: No cough, dyspnea Urologic: No hematuria, dysuria Abdominal:   No nausea, vomiting, diarrhea, bright red blood per rectum, melena, or hematemesis Neurologic:  No visual changes, wkns, changes in mental status. All other systems reviewed and are otherwise negative except as noted above.   Physical Exam:    VS:  BP (!) 176/74   Pulse (!) 58   Ht 5' (1.524 m)   Wt 144 lb (65.3 kg)   SpO2 97%   BMI 28.12 kg/m    General: Well developed, well nourished,female appearing in no acute distress. Head: Normocephalic, atraumatic, sclera non-icteric.  Neck: No carotid bruits. JVD not elevated.  Lungs: Respirations regular and unlabored, without wheezes or rales.  Heart: Regular rate and rhythm with occasional ectopic beats. No S3 or S4.  No murmur, no rubs, or gallops appreciated. Abdomen: Soft, non-tender, non-distended. No obvious abdominal masses. Msk:  Strength and tone appear normal for age. No obvious joint deformities or effusions. Extremities: No clubbing or cyanosis. No edema.  Distal pedal pulses are 2+ bilaterally. Neuro: Alert and oriented X 3. Moves all  extremities spontaneously. No focal deficits noted. Psych:  Responds to questions appropriately with a normal affect. Skin: No rashes or lesions noted  Wt Readings from Last 3 Encounters:  08/22/19 144 lb (65.3 kg)  08/06/19 145 lb (65.8 kg)  07/17/19 145 lb 3.2 oz (65.9 kg)     Studies/Labs Reviewed:   EKG:  EKG is not ordered today.   Recent Labs: 10/06/2018: B Natriuretic Peptide 107.0 11/26/2018: ALT 12 07/24/2019: BUN 36; Creatinine, Ser 1.70; Hemoglobin 11.7; Magnesium 2.1; Platelets 263; Potassium 4.4; Sodium 140; TSH 5.378   Lipid Panel    Component Value Date/Time   CHOL 164 09/17/2018 0000   TRIG 145 09/17/2018 0000   HDL 44 09/17/2018 0000   CHOLHDL 2.9 07/15/2008 0528   VLDL 23 07/15/2008 0528   LDLCALC 95 09/17/2018 0000    Additional studies/ records that were reviewed today include:   Echocardiogram: 09/2015 Study Conclusions   - Left ventricle: The cavity size was normal. Wall thickness was  normal. Systolic function was normal. The estimated ejection  fraction was in the range of 55% to 60%. Wall motion was normal;  there were no regional wall motion abnormalities. Doppler  parameters are consistent with abnormal left ventricular  relaxation (grade 1 diastolic dysfunction).  - Aortic valve: Trileaflet; mildly calcified leaflets.  - Mitral valve: Mildly thickened leaflets . There was mild  regurgitation.  - Left atrium: The atrium was at the upper limits of normal in  size.  - Right atrium: Central venous pressure (est): 3 mm Hg.  - Tricuspid valve: There was trivial regurgitation.  - Pulmonary arteries: PA peak pressure: 34 mm Hg (S).  - Pericardium, extracardiac: There was no pericardial effusion.   Impressions:   - Normal LV wall thickness with LVEF 55-60%. Grade 1 diastolic  dysfunction with intermediate LV filling pressure. Upper normal  left atrial chamber size. Mild mitral regurgitation. Trivial  tricuspid regurgitation  with PASP 34 mmHg.   Assessment:    1. Palpitations   2. Coronary artery disease involving native coronary artery of native heart without angina pectoris   3. Accelerated hypertension   4. Mixed hyperlipidemia   5. Stage 3 chronic kidney disease, unspecified whether stage 3a or 3b CKD      Plan:   In order of problems listed above:  1. Palpitations - she continues to experience symptoms on a daily basis which last for a few seconds and spontaneously resolve but she feels like her episodes have increased in frequency and severity. She did have PVC's on her EKG at the time of her last visit. Will plan a 2-week monitor to rule-out any significant arrhythmias. She denies any excessive caffeine intake and does not consume alcohol. She does have anxiety and I think this is contributing to her symptoms as well and she plans to further discuss management of this with her PCP.  - palpitations had worsened while on Verapamil and did not improve with Cardizem CD. Given her HR in the 50's, there is no room to further titrate Cardizem. Therefore, will plan to stop Cardizem and switch to BB therapy with Coreg 6.'25mg'$  BID. Will use Coreg over Lopressor due to her consistently elevated BP.   2. CAD - she had nonobstructive CAD by catheterization in 2002. No recent anginal symptoms. Continue with risk factor modification.   3. Accelerated HTN - BP was elevated at 176/74 during today's visit and she reports variable readings when checked at home. Will plan to stop Cardizem and switch to Coreg 6.'25mg'$  BID as outlined above which will likely need to be titrated as HR allows. Continue HCTZ '25mg'$  daily. If additional BP control is needed in the future, would need to be cautious with an ACE-I or ARB given her CKD. Would prefer the use of Amlodipine (would be an option given she is no longer on  Cardizem or Verapamil) or Hydralazine.   4. HLD - followed by PCP. She remains on Atorvastatin '20mg'$  daily.   5. Stage 3  CKD - creatinine at 1.70 by labs last month. Previously elevated to 2.06 last year. Continue to follow as she remains on HCTZ.   Medication Adjustments/Labs and Tests Ordered: Current medicines are reviewed at length with the patient today.  Concerns regarding medicines are outlined above.  Medication changes, Labs and Tests ordered today are listed in the Patient Instructions below. Patient Instructions  Medication Instructions:  Your physician has recommended you make the following change in your medication:   Stop Taking Cardizem  Start Coreg 6.25 Two Time Daily   *If you need a refill on your cardiac medications before your next appointment, please call your pharmacy*   Lab Work: NONE   If you have labs (blood work) drawn today and your tests are completely normal, you will receive your results only by: Marland Kitchen MyChart Message (if you have MyChart) OR . A paper copy in the mail If you have any lab test that is abnormal or we need to change your treatment, we will call you to review the results.   Testing/Procedures: Your physician has recommended that you wear an event monitor. Event monitors are medical devices that record the heart's electrical activity. Doctors most often [place] these monitors to diagnose arrhythmias. Arrhythmias are problems with the speed or rhythm of the heartbeat. The monitor is a small, portable device. You can wear one while you do your normal daily activities. This is usually used to diagnose what is causing palpitations/syncope (passing out).     Follow-Up: At [facility], you and your health needs are our priority.  As part of our continuing mission to provide you with exceptional heart care, we have created designated Provider Care Teams.  These Care Teams include your primary Cardiologist (physician) and Advanced Practice Providers (APPs -  Physician Assistants and Nurse Practitioners) who all work together to provide you with the care you need, when you  need it.  We recommend signing up for the patient portal called "MyChart".  Sign up information is provided on this After Visit Summary.  MyChart is used to connect with patients for Virtual Visits (Telemedicine).  Patients are able to view lab/test results, encounter notes, upcoming appointments, etc.  Non-urgent messages can be sent to your provider as well.   To learn more about what you can do with MyChart, go to [email or web address].    Your next appointment:   4-6 week(s)  The format for your next appointment:   In Person  Provider:   Dorris Carnes, MD or Bernerd Pho, PA-C   Other Instructions Thank you for choosing [facility]!       Signed, Erma Heritage, PA-C  08/23/2019 9:04 AM    [place] [address] [place], [place] [address] Phone: [phone] Fax: [phone]

## 2019-08-22 NOTE — Patient Instructions (Addendum)
Medication Instructions:  Your physician has recommended you make the following change in your medication:   Stop Taking Cardizem  Start Coreg 6.25 Two Time Daily   *If you need a refill on your cardiac medications before your next appointment, please call your pharmacy*   Lab Work: NONE   If you have labs (blood work) drawn today and your tests are completely normal, you will receive your results only by: Marland Kitchen MyChart Message (if you have MyChart) OR . A paper copy in the mail If you have any lab test that is abnormal or we need to change your treatment, we will call you to review the results.   Testing/Procedures: Your physician has recommended that you wear an event monitor. Event monitors are medical devices that record the heart's electrical activity. Doctors most often [place] these monitors to diagnose arrhythmias. Arrhythmias are problems with the speed or rhythm of the heartbeat. The monitor is a small, portable device. You can wear one while you do your normal daily activities. This is usually used to diagnose what is causing palpitations/syncope (passing out).     Follow-Up: At [facility], you and your health needs are our priority.  As part of our continuing mission to provide you with exceptional heart care, we have created designated Provider Care Teams.  These Care Teams include your primary Cardiologist (physician) and Advanced Practice Providers (APPs -  Physician Assistants and Nurse Practitioners) who all work together to provide you with the care you need, when you need it.  We recommend signing up for the patient portal called "MyChart".  Sign up information is provided on this After Visit Summary.  MyChart is used to connect with patients for Virtual Visits (Telemedicine).  Patients are able to view lab/test results, encounter notes, upcoming appointments, etc.  Non-urgent messages can be sent to your provider as well.   To learn more about what you can do with MyChart,  go to [email or web address].    Your next appointment:   4-6 week(s)  The format for your next appointment:   In Person  Provider:   Dorris Carnes, MD or Bernerd Pho, PA-C   Other Instructions Thank you for choosing [facility]!

## 2019-08-23 ENCOUNTER — Encounter: Payer: Self-pay | Admitting: Student

## 2019-08-30 ENCOUNTER — Emergency Department (HOSPITAL_COMMUNITY): Payer: Medicare Other

## 2019-08-30 ENCOUNTER — Encounter (HOSPITAL_COMMUNITY): Payer: Self-pay | Admitting: Emergency Medicine

## 2019-08-30 ENCOUNTER — Other Ambulatory Visit: Payer: Self-pay

## 2019-08-30 ENCOUNTER — Emergency Department (HOSPITAL_COMMUNITY)
Admission: EM | Admit: 2019-08-30 | Discharge: 2019-08-30 | Disposition: A | Discharge status: Home / Self Care | Payer: Medicare Other | Attending: Emergency Medicine | Admitting: Emergency Medicine

## 2019-08-30 DIAGNOSIS — J449 Chronic obstructive pulmonary disease, unspecified: Secondary | ICD-10-CM | POA: Diagnosis not present

## 2019-08-30 DIAGNOSIS — Z79899 Other long term (current) drug therapy: Secondary | ICD-10-CM | POA: Diagnosis not present

## 2019-08-30 DIAGNOSIS — J45909 Unspecified asthma, uncomplicated: Secondary | ICD-10-CM | POA: Diagnosis not present

## 2019-08-30 DIAGNOSIS — I129 Hypertensive chronic kidney disease with stage 1 through stage 4 chronic kidney disease, or unspecified chronic kidney disease: Secondary | ICD-10-CM | POA: Diagnosis not present

## 2019-08-30 DIAGNOSIS — R0602 Shortness of breath: Secondary | ICD-10-CM | POA: Diagnosis not present

## 2019-08-30 DIAGNOSIS — E039 Hypothyroidism, unspecified: Secondary | ICD-10-CM | POA: Insufficient documentation

## 2019-08-30 DIAGNOSIS — Z87891 Personal history of nicotine dependence: Secondary | ICD-10-CM | POA: Diagnosis not present

## 2019-08-30 DIAGNOSIS — R002 Palpitations: Secondary | ICD-10-CM | POA: Diagnosis not present

## 2019-08-30 DIAGNOSIS — N183 Chronic kidney disease, stage 3 unspecified: Secondary | ICD-10-CM | POA: Insufficient documentation

## 2019-08-30 LAB — BASIC METABOLIC PANEL
Anion gap: 10 (ref 5–15)
BUN: 27 mg/dL — ABNORMAL HIGH (ref 8–23)
CO2: 23 mmol/L (ref 22–32)
Calcium: 9.1 mg/dL (ref 8.9–10.3)
Chloride: 109 mmol/L (ref 98–111)
Creatinine, Ser: 1.96 mg/dL — ABNORMAL HIGH (ref 0.44–1.00)
GFR calc Af Amer: 27 mL/min — ABNORMAL LOW (ref >60)
GFR calc non Af Amer: 23 mL/min — ABNORMAL LOW (ref >60)
Glucose, Bld: 96 mg/dL (ref 70–99)
Potassium: 4.2 mmol/L (ref 3.5–5.1)
Sodium: 142 mmol/L (ref 135–145)

## 2019-08-30 LAB — CBC WITH DIFFERENTIAL/PLATELET
Abs Immature Granulocytes: 0.01 10*3/uL (ref 0.00–0.07)
Basophils Absolute: 0.1 10*3/uL (ref 0.0–0.1)
Basophils Relative: 1 %
Eosinophils Absolute: 0.2 10*3/uL (ref 0.0–0.5)
Eosinophils Relative: 2 %
HCT: 36.8 % (ref 36.0–46.0)
Hemoglobin: 12.1 g/dL (ref 12.0–15.0)
Immature Granulocytes: 0 %
Lymphocytes Relative: 39 %
Lymphs Abs: 2.4 10*3/uL (ref 0.7–4.0)
MCH: 30.3 pg (ref 26.0–34.0)
MCHC: 32.9 g/dL (ref 30.0–36.0)
MCV: 92.2 fL (ref 80.0–100.0)
Monocytes Absolute: 0.6 10*3/uL (ref 0.1–1.0)
Monocytes Relative: 9 %
Neutro Abs: 3 10*3/uL (ref 1.7–7.7)
Neutrophils Relative %: 49 %
Platelets: 255 10*3/uL (ref 150–400)
RBC: 3.99 MIL/uL (ref 3.87–5.11)
RDW: 14.3 % (ref 11.5–15.5)
WBC: 6.3 10*3/uL (ref 4.0–10.5)
nRBC: 0 % (ref 0.0–0.2)

## 2019-08-30 LAB — TSH: TSH: 0.734 u[IU]/mL (ref 0.350–4.500)

## 2019-08-30 NOTE — Discharge Instructions (Addendum)
As discussed, all of your labs are reassuring today.  Please call your cardiologist on Monday to schedule an appointment for further evaluation.  Continue to take the medications they have prescribed.  Return to the ER for new or worsening symptoms.
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
## 2019-08-30 NOTE — ED Triage Notes (Addendum)
PT c/o palpations with SOB at times over the past few weeks and states she has been seen at cardiology recently and has on an external heart monitor. PT talking in full sentences and 97% on room air after ambulating. PT also c/o high blood pressure today and states her medications were updated recently.

## 2019-09-03 ENCOUNTER — Ambulatory Visit: Payer: Medicare Other | Admitting: Orthopedic Surgery

## 2019-09-18 ENCOUNTER — Ambulatory Visit (INDEPENDENT_AMBULATORY_CARE_PROVIDER_SITE_OTHER): Payer: Medicare Other | Admitting: Student

## 2019-09-18 ENCOUNTER — Encounter: Payer: Self-pay | Admitting: Student

## 2019-09-18 ENCOUNTER — Other Ambulatory Visit: Payer: Self-pay

## 2019-09-18 VITALS — BP 156/72 | HR 71 | Ht 60.0 in | Wt 145.8 lb

## 2019-09-18 DIAGNOSIS — R002 Palpitations: Secondary | ICD-10-CM | POA: Diagnosis not present

## 2019-09-18 DIAGNOSIS — N183 Chronic kidney disease, stage 3 unspecified: Secondary | ICD-10-CM

## 2019-09-18 DIAGNOSIS — I10 Essential (primary) hypertension: Secondary | ICD-10-CM

## 2019-09-18 DIAGNOSIS — I2510 Atherosclerotic heart disease of native coronary artery without angina pectoris: Secondary | ICD-10-CM | POA: Diagnosis not present

## 2019-09-18 NOTE — Progress Notes (Signed)
Cardiology Office Note    Date:  09/18/2019   ID:  Koriana, Stepien 1935/04/11, MRN [id number]  PCP:  Jani Gravel, MD  Cardiologist: Dorris Carnes, MD    Chief Complaint  Patient presents with  . Follow-up    1 month visit    History of Present Illness:    [name] is a 84 y.o. female with past medical history of HTN, HLD, palpitations (PVCs by prior EKG), nonobstructive CAD by cardiac catheterization in 2002, Stage III CKD and COPD who presents to the office today for 4-week follow-up.  She was last examined by myself in 07/2019 for follow-up in regards to her palpitations. She had been on Verapamil for several years and was recently switched to Cardizem CD to see if this would help with her symptoms but had not noticed much improvement in her symptoms. She did report increased anxiety at that time and her PCP had discussed starting medication to help with her anxiety. A 2-week cardiac event monitor was recommended to rule out any significant arrhythmias. Given that her heart rate was in the 50's and did not allow for further titration of Cardizem, she was switched to Coreg 6.54m BID to see if this would help with her palpitations.   By review of notes, she was evaluated in the ED on 08/30/2019 for worsening palpitations and overall just "not feeling well".  Repeat labs were obtained and showed WBC 6.3, Hgb 12.1, platelets 255, Na+ 142, K+ 4.2 and creatinine 1.96. TSH 0.734. CXR showed no acute abnormalities. EKG showed sinus bradycardia, HR 58 with LVH. Outpatient follow-up with Cardiology was recommended.   Her monitor did result earlier today and showed NSR with occasional PAC's and PVC's along with short bursts of SVT with the longest being 5 beats. No significant arrhythmias or pauses and her average HR was 60 bpm.   In talking with the patient today, she reports her palpitations have improved since being switched to Coreg. No dizziness or presyncope. She denies any exertional  chest pain, dyspnea, orthopnea, PND or edema. She only consumes one soda daily and denies any alcohol use.   She has been monitoring her BP at home and SBP has typically been in the 120's to 130's. Elevated to 160/74 upon arrival today, at 156/72 on recheck. Says this is typically elevated at office visits.    Past Medical History:  Diagnosis Date  . Anxiety disorder, unspecified   . Arteriosclerotic cardiovascular disease (ASCVD) 2006   Nonobstructive; < 50% lesions on cath 2002; negative stress nuclear study in 10/2004  . Asthma   . Chest pain, unspecified   . Chronic kidney disease, unspecified   . Chronic obstructive pulmonary disease, unspecified ([place]   . Chronic pelvic pain in female   . Colitis due to Clostridium difficile 1Boron . COPD (chronic obstructive pulmonary disease) ([place]   . Depression with anxiety   . Diarrhea, unspecified   . Disorder of thyroid, unspecified   . Gastro-esophageal reflux disease without esophagitis   . Gastroesophageal reflux disease   . Hyperlipidemia   . Hyperlipidemia, unspecified   . Hypertension   . Hypertensive chronic kidney disease with stage 1 through stage 4 chronic kidney disease, or unspecified chronic kidney disease   . Hyperthyroidism   . Hypothyroidism   . Hypothyroidism, unspecified   . Kidney disease, chronic, stage III (moderate, EGFR 30-59 ml/min)   . Lower abdominal pain, unspecified   . Major depressive disorder, single  episode, severe without psychotic features ([place])   . Major depressive disorder, single episode, unspecified   . Nausea   . Other postherpetic nervous system involvement   . Pelvic and perineal pain   . Rash and other nonspecific skin eruption   . S/P colonoscopy 2007   few diverticula, otherwise nl  . S/P colonoscopy 12/13/10   rectal, cecal polyp, left-sided diverticulosis, hyperplastic. ?anal fissure? treated empirically  . S/P endoscopy 2009   linear esophageal erosions  . S/P endoscopy  05/10/10   Question island of salmon-colored epithelium in distal esophagus ; no Barrett's.   . Shingles   . Tobacco abuse, in remission    55-pack-year consumption; discontinued 08/2010  . Unspecified asthma, uncomplicated   . Unspecified osteoarthritis, unspecified site   . Urinary tract infection, site not specified     Past Surgical History:  Procedure Laterality Date  . ABDOMINAL HYSTERECTOMY    . BREAST LUMPECTOMY    . CATARACT EXTRACTION    . CHOLECYSTECTOMY  1962  . COLONOSCOPY  12/13/2010   Left-sided diverticulosis.  Cecal polyp, status post hot snare polypectomy/ Diminutive rectal polyp, status post cold biopsy removal tender/painful anal canal, ? occult anal fissure- Not visualized  . COLONOSCOPY N/A 10/14/2015   Diverticulosis  . ESOPHAGOGASTRODUODENOSCOPY  05/10/10   benign mucosa with mild chronic inflammation.  . ESOPHAGOGASTRODUODENOSCOPY (EGD) WITH PROPOFOL N/A 02/13/2019   Procedure: ESOPHAGOGASTRODUODENOSCOPY (EGD) WITH PROPOFOL;  Surgeon: Daneil Dolin, MD;  Location[facility];  Service: Endoscopy;  Laterality: N/A;  3:00pm  . FLEXIBLE SIGMOIDOSCOPY  12/29/2011   Procedure: FLEXIBLE SIGMOIDOSCOPY;  Surgeon: Rogene Houston, MD;  Location: [facility];  Service: Endoscopy;  Laterality: N/A;  200-Ann notified pt to be here @ 1:00  . NISSEN FUNDOPLICATION    . YAG LASER APPLICATION Left 9/62/9528   Procedure: YAG LASER APPLICATION;  Surgeon: Williams Che, MD;  Location: [facility] OR[facility];  Service: Ophthalmology;  Laterality: Left;    Current Medications: Outpatient Medications Prior to Visit  Medication Sig Dispense Refill  . albuterol (PROVENTIL HFA;VENTOLIN HFA) 108 (90 Base) MCG/ACT inhaler Inhale 1-2 puffs into the lungs every 6 (six) hours as needed for wheezing or shortness of breath.    Marland Kitchen atorvastatin (LIPITOR) 20 MG tablet Take 20 mg every evening by mouth.     . carvedilol (COREG) 6.25 MG tablet Take 1 tablet (6.25 mg total) by mouth 2 (two) times daily.  180 tablet 3  . ESTRACE VAGINAL 0.1 MG/GM vaginal cream Apply 1 application topically once a week.   4  . gabapentin (NEURONTIN) 100 MG capsule TAKE 1 CAPSULE BY MOUTH THREE TIMES A DAY. 90 capsule 0  . hydrochlorothiazide (HYDRODIURIL) 25 MG tablet Take 25 mg by mouth daily.    Marland Kitchen HYDROcodone-acetaminophen (NORCO) 10-325 MG tablet Take 0.5-2 tablets by mouth every 4 (four) hours as needed (1/2 tablet for mild pain, 1 tablet for moderate pain, 2 tablets for severe pain). 80 tablet 0  . hydroxypropyl methylcellulose (ISOPTO TEARS) 2.5 % ophthalmic solution Place 1 drop into both eyes 3 (three) times daily as needed. For dry eyes.    Marland Kitchen levothyroxine (SYNTHROID) 100 MCG tablet Take 100 mcg by mouth daily before breakfast.    . LINZESS 145 MCG CAPS capsule TAKE 1 CAPSULE ONCE DAILY BEFORE BREAKFAST. 90 capsule 3  . Multiple Vitamins-Minerals (EYE VITAMINS PO) Take 1 tablet by mouth daily.     Marland Kitchen omeprazole (PRILOSEC) 40 MG capsule Take 40 mg by mouth 2 (two)  times daily before a meal.    . ondansetron (ZOFRAN) 4 MG tablet Take 1 tablet (4 mg total) by mouth every 8 (eight) hours as needed for nausea or vomiting. 60 tablet 2  . potassium chloride SA (K-DUR) 20 MEQ tablet Take 20 mEq by mouth daily with lunch.      No facility-administered medications prior to visit.     Allergies:   Amoxicillin, Hydralazine, Hydrocodone-acetaminophen, Paroxetine, Sulfa antibiotics, Venlafaxine, and Nalbuphine   Social History   Socioeconomic History  . Marital status: Married    Spouse name: Not on file  . Number of children: 4  . Years of education: Not on file  . Highest education level: Not on file  Occupational History  . Not on file  Tobacco Use  . Smoking status: Former Smoker    Packs/day: 1.00    Years: 55.00    Pack years: 55.00    Quit date: 09/15/2010    Years since quitting: 9.0  . Smokeless tobacco: Never Used  Vaping Use  . Vaping Use: Never used  Substance and Sexual Activity  . Alcohol  use: No    Alcohol/week: 0.0 standard drinks  . Drug use: No  . Sexual activity: Not Currently  Other Topics Concern  . Not on file  Social History Narrative  . Not on file   Social Determinants of Health   Financial Resource Strain:   . Difficulty of Paying Living Expenses:   Food Insecurity:   . Worried About Charity fundraiser in the Last Year:   . Arboriculturist in the Last Year:   Transportation Needs:   . Film/video editor (Medical):   Marland Kitchen Lack of Transportation (Non-Medical):   Physical Activity:   . Days of Exercise per Week:   . Minutes of Exercise per Session:   Stress:   . Feeling of Stress :   Social Connections:   . Frequency of Communication with Friends and Family:   . Frequency of Social Gatherings with Friends and Family:   . Attends Religious Services:   . Active Member of Clubs or Organizations:   . Attends Archivist Meetings:   Marland Kitchen Marital Status:      Family History:  The patient's family history includes Alzheimer's disease in her mother; Aneurysm in her son; Cancer in her mother and sister; Diabetes in her mother and son; Heart disease in her brother and father; Hypertension in her brother.   Review of Systems:   Please see the history of present illness.     General:  No chills, fever, night sweats or weight changes.  Cardiovascular:  No chest pain, dyspnea on exertion, edema, orthopnea, paroxysmal nocturnal dyspnea. Positive for palpitations.  Dermatological: No rash, lesions/masses Respiratory: No cough, dyspnea Urologic: No hematuria, dysuria Abdominal:   No nausea, vomiting, diarrhea, bright red blood per rectum, melena, or hematemesis Neurologic:  No visual changes, wkns, changes in mental status. All other systems reviewed and are otherwise negative except as noted above.   Physical Exam:    VS:  BP (!) 156/72   Pulse 71   Ht 5' (1.524 m)   Wt 145 lb 12.8 oz (66.1 kg)   SpO2 92%   BMI 28.47 kg/m    General: Well  developed, well nourished,female appearing in no acute distress. Head: Normocephalic, atraumatic, sclera non-icteric.  Neck: No carotid bruits. JVD not elevated.  Lungs: Respirations regular and unlabored, without wheezes or rales.  Heart: Regular rate and  rhythm. No S3 or S4.  No murmur, no rubs, or gallops appreciated. Abdomen: Soft, non-tender, non-distended. No obvious abdominal masses. Msk:  Strength and tone appear normal for age. No obvious joint deformities or effusions. Extremities: No clubbing or cyanosis. Trace ankle edema bilaterally.  Distal pedal pulses are 2+ bilaterally. Neuro: Alert and oriented X 3. Moves all extremities spontaneously. No focal deficits noted. Psych:  Responds to questions appropriately with a normal affect. Skin: No rashes or lesions noted  Wt Readings from Last 3 Encounters:  09/18/19 145 lb 12.8 oz (66.1 kg)  08/30/19 144 lb (65.3 kg)  08/22/19 144 lb (65.3 kg)     Studies/Labs Reviewed:   EKG:  EKG is not ordered today. EKG from 08/30/2019 is reviewed which shows sinus bradycardia, HR 58 with LVH. No acute ST abnormalities.   Recent Labs: 10/06/2018: B Natriuretic Peptide 107.0 11/26/2018: ALT 12 07/24/2019: Magnesium 2.1 08/30/2019: BUN 27; Creatinine, Ser 1.96; Hemoglobin 12.1; Platelets 255; Potassium 4.2; Sodium 142; TSH 0.734   Lipid Panel    Component Value Date/Time   CHOL 164 09/17/2018 0000   TRIG 145 09/17/2018 0000   HDL 44 09/17/2018 0000   CHOLHDL 2.9 07/15/2008 0528   VLDL 23 07/15/2008 0528   LDLCALC 95 09/17/2018 0000    Additional studies/ records that were reviewed today include:   Echocardiogram: 09/2015 Study Conclusions   - Left ventricle: The cavity size was normal. Wall thickness was  normal. Systolic function was normal. The estimated ejection  fraction was in the range of 55% to 60%. Wall motion was normal;  there were no regional wall motion abnormalities. Doppler  parameters are consistent with abnormal  left ventricular  relaxation (grade 1 diastolic dysfunction).  - Aortic valve: Trileaflet; mildly calcified leaflets.  - Mitral valve: Mildly thickened leaflets . There was mild  regurgitation.  - Left atrium: The atrium was at the upper limits of normal in  size.  - Right atrium: Central venous pressure (est): 3 mm Hg.  - Tricuspid valve: There was trivial regurgitation.  - Pulmonary arteries: PA peak pressure: 34 mm Hg (S).  - Pericardium, extracardiac: There was no pericardial effusion.   Impressions:   - Normal LV wall thickness with LVEF 55-60%. Grade 1 diastolic  dysfunction with intermediate LV filling pressure. Upper normal  left atrial chamber size. Mild mitral regurgitation. Trivial  tricuspid regurgitation with PASP 34 mmHg.   Event Monitor: 08/2019 Sinus rhythm  With occasional PACs, short burst SVT(5 beats)   42 to 171 bpm   Average HR 60 NO significant pauses   Assessment:    1. Palpitations   2. Essential hypertension   3. Coronary artery disease involving native coronary artery of native heart without angina pectoris   4. Stage 3 chronic kidney disease, unspecified whether stage 3a or 3b CKD      Plan:   In order of problems listed above:  1. Palpitations - Her recent event monitor showed NSR with occasional PAC's and PVC's along with brief bursts of SVT with the longest being 5 beats. Symptoms were not controlled with Verapamil or Cardizem but have improved since being transitioned to Coreg 6.48m BID. Will continue current dosing for now given average HR of 60 bpm while wearing her monitor. Continued reduction in caffeine intake advised.   2. HTN - BP is elevated at 156/72 during today's visit but has been well-controlled at home with SBP in the 120's to 130's. She does report having white-coat HTN. Given well-controlled  readings in the ambulatory setting, will continue her current regimen for now with Coreg 6.57m BID and HCTZ 266mdaily (has  remained on this per her PCP). If additional BP control were needed in the future, would consider the use of Amlodipine or Hydralazine given her underlying CKD.   3. CAD - She had nonobstructive CAD by cardiac catheterization in 2002. She denies any recent anginal symptoms. Continue Atorvastatin 2035maily and Coreg 6.19m5mD.   4. Stage 3 CKD - Baseline creatinine 1.7 - 1.9. At 1.96 by recent labs earlier this month. She only consumes 20-30 ounces of liquid per day and was encouraged to consume 1.5 - 2.0L per day. Recommended avoiding NSAIDS. If CKD worsens, would need to consider discontinuation of HCTZ but she has been on this for 10+ years per her PCP.   Medication Adjustments/Labs and Tests Ordered: Current medicines are reviewed at length with the patient today.  Concerns regarding medicines are outlined above.  Medication changes, Labs and Tests ordered today are listed in the Patient Instructions below. Patient Instructions  Medication Instructions:  Your physician recommends that you continue on your current medications as directed. Please refer to the Current Medication list given to you today.  *If you need a refill on your cardiac medications before your next appointment, please call your pharmacy*   Lab Work: None today If you have labs (blood work) drawn today and your tests are completely normal, you will receive your results only by: . MyMarland Kitchenhart Message (if you have MyChart) OR . A paper copy in the mail If you have any lab test that is abnormal or we need to change your treatment, we will call you to review the results.   Testing/Procedures: None today   Follow-Up: [facility] and your health needs are our priority.  As part of our continuing mission to provide you with exceptional heart care, we have created designated Provider Care Teams.  These Care Teams include your primary Cardiologist (physician) and Advanced Practice Providers (APPs -  Physician  Assistants and Nurse Practitioners) who all work together to provide you with the care you need, when you need it.  We recommend signing up for the patient portal called "MyChart".  Sign up information is provided on this After Visit Summary.  MyChart is used to connect with patients for Virtual Visits (Telemedicine).  Patients are able to view lab/test results, encounter notes, upcoming appointments, etc.  Non-urgent messages can be sent to your provider as well.   To learn more about what you can do with MyChart, go to [email or web address] Your next appointment:   3 month(s)  The format for your next appointment:   In Person  Provider:   You may see PaulDorris Carnes or one of the following Advanced Practice Providers on your designated Care Team:    BritBernerd Pho-C        Other Instructions None      Thank you for choosing [facility]          Signed, BritErma Heritage-C  09/18/2019 7:40 PM    [facility] [address] [id number]: [phone]: [phone]

## 2019-09-18 NOTE — Patient Instructions (Signed)
Medication Instructions:  Your physician recommends that you continue on your current medications as directed. Please refer to the Current Medication list given to you today.  *If you need a refill on your cardiac medications before your next appointment, please call your pharmacy*   Lab Work: None today If you have labs (blood work) drawn today and your tests are completely normal, you will receive your results only by:  [place] (if you have MyChart) OR  A paper copy in the mail If you have any lab test that is abnormal or we need to change your treatment, we will call you to review the results.   Testing/Procedures: None today   Follow-Up: At [facility], you and your health needs are our priority.  As part of our continuing mission to provide you with exceptional heart care, we have created designated Provider Care Teams.  These Care Teams include your primary Cardiologist (physician) and Advanced Practice Providers (APPs -  Physician Assistants and Nurse Practitioners) who all work together to provide you with the care you need, when you need it.  We recommend signing up for the patient portal called "MyChart".  Sign up information is provided on this After Visit Summary.  MyChart is used to connect with patients for Virtual Visits (Telemedicine).  Patients are able to view lab/test results, encounter notes, upcoming appointments, etc.  Non-urgent messages can be sent to your provider as well.   To learn more about what you can do with MyChart, go to [email or web address].    Your next appointment:   3 month(s)  The format for your next appointment:   In Person  Provider:   You may see Dorris Carnes, MD or one of the following Advanced Practice Providers on your designated Care Team:    Bernerd Pho, PA-C        Other Instructions None      Thank you for choosing [facility] !

## 2019-09-23 ENCOUNTER — Ambulatory Visit: Payer: Medicare Other | Admitting: Orthopedic Surgery

## 2019-11-04 DIAGNOSIS — G8929 Other chronic pain: Secondary | ICD-10-CM | POA: Insufficient documentation

## 2019-11-04 DIAGNOSIS — M25561 Pain in right knee: Secondary | ICD-10-CM | POA: Insufficient documentation

## 2019-11-13 ENCOUNTER — Telehealth: Payer: Self-pay

## 2019-11-13 MED ORDER — AMLODIPINE BESYLATE 5 MG PO TABS
5.0000 mg | ORAL_TABLET | Freq: Every day | ORAL | 3 refills | Status: DC
Start: 2019-11-13 — End: 2019-12-17

## 2019-11-13 NOTE — Telephone Encounter (Signed)
I spoke with patient.She will start amlodipine 5 mg daily and keep bp recordings for the next 2-3 weeks

## 2019-11-13 NOTE — Telephone Encounter (Signed)
    Given her elevated readings, would recommend starting Amlodipine 5 mg daily. Follow BP and report back with readings in 2-3 weeks. She is on Coreg 6.25 mg twice daily but would not further titrate this given her heart rate in the 50's at times. Continue Coreg and HCTZ at current dosing.   Signed, Erma Heritage, PA-C 11/13/2019, 10:05 AM Pager: [phone]

## 2019-11-13 NOTE — Telephone Encounter (Signed)
Most recent BP's : 176/92, 156/72, 167/88, 171/66, 169/79   Patient reports HR in the 50-60's range. She said she feels anxious.

## 2019-11-13 NOTE — Telephone Encounter (Signed)
New message    Pt c/o BP issue: STAT if pt c/o blurred vision, one-sided weakness or slurred speech  1. What are your last 5 BP readings?  179/92   2. Are you having any other symptoms (ex. Dizziness, headache, blurred vision, passed out)? No   3. What is your BP issue? Thinks her bp is too high and her hr is staying at 71 , she feels a little sik with this

## 2019-12-01 ENCOUNTER — Emergency Department (HOSPITAL_COMMUNITY)
Admission: EM | Admit: 2019-12-01 | Discharge: 2019-12-01 | Disposition: A | Discharge status: Home / Self Care | Payer: Medicare Other | Attending: Emergency Medicine | Admitting: Emergency Medicine

## 2019-12-01 ENCOUNTER — Other Ambulatory Visit: Payer: Self-pay

## 2019-12-01 ENCOUNTER — Encounter (HOSPITAL_COMMUNITY): Payer: Self-pay | Admitting: Emergency Medicine

## 2019-12-01 DIAGNOSIS — Z79890 Hormone replacement therapy (postmenopausal): Secondary | ICD-10-CM | POA: Diagnosis not present

## 2019-12-01 DIAGNOSIS — Z7951 Long term (current) use of inhaled steroids: Secondary | ICD-10-CM | POA: Insufficient documentation

## 2019-12-01 DIAGNOSIS — E039 Hypothyroidism, unspecified: Secondary | ICD-10-CM | POA: Insufficient documentation

## 2019-12-01 DIAGNOSIS — Z87891 Personal history of nicotine dependence: Secondary | ICD-10-CM | POA: Insufficient documentation

## 2019-12-01 DIAGNOSIS — N184 Chronic kidney disease, stage 4 (severe): Secondary | ICD-10-CM | POA: Diagnosis not present

## 2019-12-01 DIAGNOSIS — I129 Hypertensive chronic kidney disease with stage 1 through stage 4 chronic kidney disease, or unspecified chronic kidney disease: Secondary | ICD-10-CM | POA: Insufficient documentation

## 2019-12-01 DIAGNOSIS — I10 Essential (primary) hypertension: Secondary | ICD-10-CM

## 2019-12-01 DIAGNOSIS — Z79899 Other long term (current) drug therapy: Secondary | ICD-10-CM | POA: Insufficient documentation

## 2019-12-01 DIAGNOSIS — N3000 Acute cystitis without hematuria: Secondary | ICD-10-CM | POA: Diagnosis not present

## 2019-12-01 DIAGNOSIS — N390 Urinary tract infection, site not specified: Secondary | ICD-10-CM | POA: Diagnosis present

## 2019-12-01 LAB — URINALYSIS, ROUTINE W REFLEX MICROSCOPIC
Bilirubin Urine: NEGATIVE
Glucose, UA: NEGATIVE mg/dL
Hgb urine dipstick: NEGATIVE
Ketones, ur: NEGATIVE mg/dL
Nitrite: NEGATIVE
Protein, ur: >=300 mg/dL — AB
Specific Gravity, Urine: 1.016 (ref 1.005–1.030)
WBC, UA: >50 WBC/hpf — ABNORMAL HIGH (ref 0–5)
pH: 5 (ref 5.0–8.0)

## 2019-12-01 LAB — BASIC METABOLIC PANEL
Anion gap: 8 (ref 5–15)
BUN: 32 mg/dL — ABNORMAL HIGH (ref 8–23)
CO2: 23 mmol/L (ref 22–32)
Calcium: 8.9 mg/dL (ref 8.9–10.3)
Chloride: 110 mmol/L (ref 98–111)
Creatinine, Ser: 1.82 mg/dL — ABNORMAL HIGH (ref 0.44–1.00)
GFR calc Af Amer: 29 mL/min — ABNORMAL LOW (ref >60)
GFR calc non Af Amer: 25 mL/min — ABNORMAL LOW (ref >60)
Glucose, Bld: 116 mg/dL — ABNORMAL HIGH (ref 70–99)
Potassium: 4.2 mmol/L (ref 3.5–5.1)
Sodium: 141 mmol/L (ref 135–145)

## 2019-12-01 LAB — CBC
HCT: 36.7 % (ref 36.0–46.0)
Hemoglobin: 11.5 g/dL — ABNORMAL LOW (ref 12.0–15.0)
MCH: 29.4 pg (ref 26.0–34.0)
MCHC: 31.3 g/dL (ref 30.0–36.0)
MCV: 93.9 fL (ref 80.0–100.0)
Platelets: 212 10*3/uL (ref 150–400)
RBC: 3.91 MIL/uL (ref 3.87–5.11)
RDW: 13.6 % (ref 11.5–15.5)
WBC: 6.3 10*3/uL (ref 4.0–10.5)
nRBC: 0 % (ref 0.0–0.2)

## 2019-12-01 MED ORDER — CEPHALEXIN 500 MG PO CAPS
500.0000 mg | ORAL_CAPSULE | Freq: Once | ORAL | Status: AC
  Administered 2019-12-01: 500 mg via ORAL
  Filled 2019-12-01: qty 1

## 2019-12-01 MED ORDER — CEPHALEXIN 500 MG PO CAPS
500.0000 mg | ORAL_CAPSULE | Freq: Four times a day (QID) | ORAL | 0 refills | Status: DC
Start: 2019-12-01 — End: 2019-12-17

## 2019-12-01 NOTE — ED Triage Notes (Signed)
Pt c/o UTI symptoms x 2-3 months and bilateral leg cramping

## 2019-12-01 NOTE — Discharge Instructions (Addendum)
Continue the antibiotic Keflex for the urinary tract infection.  Make an appointment to follow-up with your cardiology doctor.  For follow-up of the hypertension.  Expect the urinary tract symptoms to improve over the next 2 days.  Return if they do not.  Urine sent for culture as well.  Keep your log book for your cardiologist.  Since I started a new medication about 2 weeks ago I would contact them this week with your blood pressure trends from your log book.  You may need some adjustment in your medications.  In the meantime continue all your current hypertensive meds.
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
## 2019-12-04 ENCOUNTER — Telehealth: Payer: Self-pay | Admitting: Student

## 2019-12-04 LAB — URINE CULTURE: Culture: >=100000 — AB

## 2019-12-04 NOTE — Telephone Encounter (Signed)
Patients BP today is 157/82, HR 52. She has apt next week with Dr.Kim's office and will discuss chronic tingling sensation she has in hands and body with them.i asked her to keep BP log and bring to cardiology apt on 12/17/19

## 2019-12-04 NOTE — Telephone Encounter (Signed)
Pt complaining of tingling in hands and whole body. Pt states she feels asthough her body is going to sleep. Pt states HR is irregular. BP is up 185/84 and 179/60. Pt states she was recently DX with UTI.   Please call [phone]    Thanks

## 2019-12-05 ENCOUNTER — Telehealth: Payer: Self-pay

## 2019-12-05 NOTE — Telephone Encounter (Signed)
Post ED Visit - Positive Culture Follow-up  Culture report reviewed by antimicrobial stewardship pharmacist: [place] Team []  Elenor Quinones, Pharm.D. []  Heide Guile, Pharm.D., BCPS AQ-ID []  Parks Neptune, Pharm.D., BCPS [x]  Alycia Rossetti, Pharm.D., BCPS []  [place], [place].D., BCPS, AAHIVP []  Legrand Como, Pharm.D., BCPS, AAHIVP []  Salome Arnt, PharmD, BCPS []  Johnnette Gourd, PharmD, BCPS []  Hughes Better, PharmD, BCPS []  Leeroy Cha, PharmD []  Laqueta Linden, PharmD, BCPS []  Albertina Parr, PharmD  [place] Team []  Leodis Sias, PharmD []  Lindell Spar, PharmD []  Royetta Asal, PharmD []  Graylin Shiver, Rph []  Rema Fendt) Glennon Mac, PharmD []  Arlyn Dunning, PharmD []  Netta Cedars, PharmD []  Dia Sitter, PharmD []  Leone Haven, PharmD []  Gretta Arab, PharmD []  Theodis Shove, PharmD []  Peggyann Juba, PharmD []  Reuel Boom, PharmD   Positive urine culture Treated with Cephalexin, organism sensitive to the same and no further patient follow-up is required at this time.  Genia Del 12/05/2019, 9:37 AM

## 2019-12-17 ENCOUNTER — Other Ambulatory Visit: Payer: Self-pay

## 2019-12-17 ENCOUNTER — Ambulatory Visit (INDEPENDENT_AMBULATORY_CARE_PROVIDER_SITE_OTHER): Payer: Medicare Other | Admitting: Student

## 2019-12-17 ENCOUNTER — Encounter: Payer: Self-pay | Admitting: Student

## 2019-12-17 VITALS — BP 150/90 | HR 54 | Ht 60.0 in | Wt 149.0 lb

## 2019-12-17 DIAGNOSIS — R002 Palpitations: Secondary | ICD-10-CM | POA: Diagnosis not present

## 2019-12-17 DIAGNOSIS — I10 Essential (primary) hypertension: Secondary | ICD-10-CM

## 2019-12-17 DIAGNOSIS — E782 Mixed hyperlipidemia: Secondary | ICD-10-CM | POA: Diagnosis not present

## 2019-12-17 DIAGNOSIS — N183 Chronic kidney disease, stage 3 unspecified: Secondary | ICD-10-CM

## 2019-12-17 MED ORDER — AMLODIPINE BESYLATE 10 MG PO TABS
10.0000 mg | ORAL_TABLET | Freq: Every day | ORAL | 3 refills | Status: DC
Start: 2019-12-17 — End: 2020-06-14

## 2019-12-17 NOTE — Progress Notes (Signed)
Cardiology Office Note    Date:  12/17/2019   ID:  Alainah, Phang 1935/04/22, MRN [id number]  PCP:  Patient, No Pcp Per  Cardiologist: Dorris Carnes, MD    Chief Complaint  Patient presents with  . Follow-up    3 month visit    History of Present Illness:    EMMANUEL ERCOLE is a 84 y.o. female with past medical history of HTN, HLD, palpitations (PVCs by prior EKG), nonobstructive CAD by cardiac catheterization in 2002, Stage III CKD and COPD who presents to the office today for 34-month follow-up.   She was last examined by myself in 08/2019 and had recently been evaluated in the ED for palpitations. Her labs demonstrated no significant abnormalities and recent event monitor had shown normal sinus rhythm with occasional PAC's and PVC's with short bursts of SVT with the longest being 5 beats. No significant arrhythmias or pauses were noted. She reported her palpitations had improved at the time of her visit since being switched to Coreg and she denied any dizziness or presyncope. BP was elevated at 156/72 during her visit but has been well controlled when checked at home. She was encouraged to continue to monitor her readings in the ambulatory setting and was recommended consider the addition of Amlodipine in the future given her underlying CKD. Coreg was not further titrated given her average heart rate in the 60's. She did call the office in 10/2019 reporting elevated readings and she was started on Amlodipine 5 mg daily.  Was evaluated in the ED on 12/01/2019 for dysuria and was prescribed Keflex. Repeat labs showed Na+ 141, K+ 4.2 and creatinine 1.82 (close to baseline). BP was significantly elevated and she was informed to follow-up with her PCP and Cardiology as an outpatient.   In talking with the patient today, she reports being under increased stress over the past few weeks as her dog of 17 years passed away earlier this month. Her blood pressure has been elevated when checked at home  with SBP typically in the 140's to 150's and she feels like stress could be playing a role. She becomes tearful today when talking about the death of her dog and is still grieving from this. She denies any recent chest pain, orthopnea, PND or lower extremity edema. She does experience baseline dyspnea on exertion but denies any acute changes in this. Has occasional palpitations when feeling stressed but reports symptoms have improved since being started on Coreg earlier this year.   Past Medical History:  Diagnosis Date  . Anxiety disorder, unspecified   . Arteriosclerotic cardiovascular disease (ASCVD) 2006   Nonobstructive; < 50% lesions on cath 2002; negative stress nuclear study in 10/2004  . Asthma   . Chest pain, unspecified   . Chronic kidney disease, unspecified   . Chronic obstructive pulmonary disease, unspecified ([place])   . Chronic pelvic pain in female   . Colitis due to Clostridium difficile [place]  . COPD (chronic obstructive pulmonary disease) ([place])   . Depression with anxiety   . Diarrhea, unspecified   . Disorder of thyroid, unspecified   . Gastro-esophageal reflux disease without esophagitis   . Gastroesophageal reflux disease   . Hyperlipidemia   . Hyperlipidemia, unspecified   . Hypertension   . Hypertensive chronic kidney disease with stage 1 through stage 4 chronic kidney disease, or unspecified chronic kidney disease   . Hyperthyroidism   . Hypothyroidism   . Hypothyroidism, unspecified   . Kidney  disease, chronic, stage III (moderate, EGFR 30-59 ml/min)   . Lower abdominal pain, unspecified   . Major depressive disorder, single episode, severe without psychotic features (HCC)   . Major depressive disorder, single episode, unspecified   . Nausea   . Other postherpetic nervous system involvement   . Pelvic and perineal pain   . Rash and other nonspecific skin eruption   . S/P colonoscopy 2007   few diverticula, otherwise nl  . S/P colonoscopy 12/13/10    rectal, cecal polyp, left-sided diverticulosis, hyperplastic. ?anal fissure? treated empirically  . S/P endoscopy 2009   linear esophageal erosions  . S/P endoscopy 05/10/10   Question island of salmon-colored epithelium in distal esophagus ; no Barrett's.   . Shingles   . Tobacco abuse, in remission    55-pack-year consumption; discontinued 08/2010  . Unspecified asthma, uncomplicated   . Unspecified osteoarthritis, unspecified site   . Urinary tract infection, site not specified     Past Surgical History:  Procedure Laterality Date  . ABDOMINAL HYSTERECTOMY    . BREAST LUMPECTOMY    . CATARACT EXTRACTION    . CHOLECYSTECTOMY  1962  . COLONOSCOPY  12/13/2010   Left-sided diverticulosis.  Cecal polyp, status post hot snare polypectomy/ Diminutive rectal polyp, status post cold biopsy removal tender/painful anal canal, ? occult anal fissure- Not visualized  . COLONOSCOPY N/A 10/14/2015   Diverticulosis  . ESOPHAGOGASTRODUODENOSCOPY  05/10/10   benign mucosa with mild chronic inflammation.  . ESOPHAGOGASTRODUODENOSCOPY (EGD) WITH PROPOFOL N/A 02/13/2019   Procedure: ESOPHAGOGASTRODUODENOSCOPY (EGD) WITH PROPOFOL;  Surgeon: Corbin Ade, MD;  Location: [facility];  Service: Endoscopy;  Laterality: N/A;  3:00pm  . FLEXIBLE SIGMOIDOSCOPY  12/29/2011   Procedure: FLEXIBLE SIGMOIDOSCOPY;  Surgeon: Malissa Hippo, MD;  Location: [facility];  Service: Endoscopy;  Laterality: N/A;  200-Ann notified pt to be here @ 1:00  . NISSEN FUNDOPLICATION    . YAG LASER APPLICATION Left 05/25/2014   Procedure: YAG LASER APPLICATION;  Surgeon: Susa Simmonds, MD;  Location: [facility];  Service: Ophthalmology;  Laterality: Left;    Current Medications: Outpatient Medications Prior to Visit  Medication Sig Dispense Refill  . albuterol (PROVENTIL HFA;VENTOLIN HFA) 108 (90 Base) MCG/ACT inhaler Inhale 1-2 puffs into the lungs every 6 (six) hours as needed for wheezing or shortness of breath.    Marland Kitchen  atorvastatin (LIPITOR) 20 MG tablet Take 20 mg every evening by mouth.     . carvedilol (COREG) 6.25 MG tablet Take 1 tablet (6.25 mg total) by mouth 2 (two) times daily. 180 tablet 3  . gabapentin (NEURONTIN) 100 MG capsule TAKE 1 CAPSULE BY MOUTH THREE TIMES A DAY. 90 capsule 0  . hydrochlorothiazide (HYDRODIURIL) 25 MG tablet Take 25 mg by mouth daily.    Marland Kitchen HYDROcodone-acetaminophen (NORCO) 10-325 MG tablet Take 0.5-2 tablets by mouth every 4 (four) hours as needed (1/2 tablet for mild pain, 1 tablet for moderate pain, 2 tablets for severe pain). 80 tablet 0  . hydroxypropyl methylcellulose (ISOPTO TEARS) 2.5 % ophthalmic solution Place 1 drop into both eyes 3 (three) times daily as needed. For dry eyes.    Marland Kitchen levothyroxine (SYNTHROID) 100 MCG tablet Take 100 mcg by mouth daily before breakfast.    . LINZESS 145 MCG CAPS capsule TAKE 1 CAPSULE ONCE DAILY BEFORE BREAKFAST. 90 capsule 3  . omeprazole (PRILOSEC) 40 MG capsule Take 40 mg by mouth 2 (two) times daily before a meal.    . ondansetron (ZOFRAN) 4  MG tablet Take 1 tablet (4 mg total) by mouth every 8 (eight) hours as needed for nausea or vomiting. 60 tablet 2  . potassium chloride SA (K-DUR) 20 MEQ tablet Take 20 mEq by mouth daily with lunch.     Marland Kitchen amLODipine (NORVASC) 5 MG tablet Take 1 tablet (5 mg total) by mouth daily. 90 tablet 3  . Multiple Vitamins-Minerals (EYE VITAMINS PO) Take 1 tablet by mouth daily.     . Vitamin D, Ergocalciferol, (DRISDOL) 1.25 MG (50000 UNIT) CAPS capsule Take 50,000 Units by mouth once a week.    . cephALEXin (KEFLEX) 500 MG capsule Take 1 capsule (500 mg total) by mouth 4 (four) times daily. 28 capsule 0  . ESTRACE VAGINAL 0.1 MG/GM vaginal cream Apply 1 application topically once a week.   4   No facility-administered medications prior to visit.     Allergies:   Amoxicillin, Hydralazine, Hydrocodone-acetaminophen, Paroxetine, Sulfa antibiotics, Venlafaxine, and Nalbuphine   Social History    Socioeconomic History  . Marital status: Married    Spouse name: Not on file  . Number of children: 4  . Years of education: Not on file  . Highest education level: Not on file  Occupational History  . Not on file  Tobacco Use  . Smoking status: Former Smoker    Packs/day: 1.00    Years: 55.00    Pack years: 55.00    Quit date: 09/15/2010    Years since quitting: 9.2  . Smokeless tobacco: Never Used  Vaping Use  . Vaping Use: Never used  Substance and Sexual Activity  . Alcohol use: No    Alcohol/week: 0.0 standard drinks  . Drug use: No  . Sexual activity: Not Currently  Other Topics Concern  . Not on file  Social History Narrative  . Not on file   Social Determinants of Health   Financial Resource Strain:   . Difficulty of Paying Living Expenses: Not on file  Food Insecurity:   . Worried About Charity fundraiser in the Last Year: Not on file  . Ran Out of Food in the Last Year: Not on file  Transportation Needs:   . Lack of Transportation (Medical): Not on file  . Lack of Transportation (Non-Medical): Not on file  Physical Activity:   . Days of Exercise per Week: Not on file  . Minutes of Exercise per Session: Not on file  Stress:   . Feeling of Stress : Not on file  Social Connections:   . Frequency of Communication with Friends and Family: Not on file  . Frequency of Social Gatherings with Friends and Family: Not on file  . Attends Religious Services: Not on file  . Active Member of Clubs or Organizations: Not on file  . Attends Archivist Meetings: Not on file  . Marital Status: Not on file     Family History:  The patient's family history includes Alzheimer's disease in her mother; Aneurysm in her son; Cancer in her mother and sister; Diabetes in her mother and son; Heart disease in her brother and father; Hypertension in her brother.   Review of Systems:   Please see the history of present illness.     General:  No chills, fever, night  sweats or weight changes.  Cardiovascular:  No chest pain, edema, orthopnea, paroxysmal nocturnal dyspnea. Positive for dyspnea on exertion and palpations.  Dermatological: No rash, lesions/masses Respiratory: No cough, dyspnea Urologic: No hematuria, dysuria Abdominal:   No  nausea, vomiting, diarrhea, bright red blood per rectum, melena, or hematemesis Neurologic:  No visual changes, wkns, changes in mental status. All other systems reviewed and are otherwise negative except as noted above.   Physical Exam:    VS:  BP (!) 150/90   Pulse (!) 54   Ht 5' (1.524 m)   Wt 149 lb (67.6 kg)   SpO2 97%   BMI 29.10 kg/m    General: Well developed, well nourished,female appearing in no acute distress. Head: Normocephalic, atraumatic. Neck: No carotid bruits. JVD not elevated.  Lungs: Respirations regular and unlabored, without wheezes or rales.  Heart: Regular rate and rhythm. No S3 or S4.  No murmur, no rubs, or gallops appreciated. Abdomen: Appears non-distended. No obvious abdominal masses. Msk:  Strength and tone appear normal for age. No obvious joint deformities or effusions. Extremities: No clubbing or cyanosis. Trace ankle edema bilaterally.  Distal pedal pulses are 2+ bilaterally. Neuro: Alert and oriented X 3. Moves all extremities spontaneously. No focal deficits noted. Psych:  Responds to questions appropriately with a normal affect. Skin: No rashes or lesions noted  Wt Readings from Last 3 Encounters:  12/17/19 149 lb (67.6 kg)  12/01/19 145 lb (65.8 kg)  09/18/19 145 lb 12.8 oz (66.1 kg)    Studies/Labs Reviewed:   EKG:  EKG is not ordered today.   Recent Labs: 07/24/2019: Magnesium 2.1 08/30/2019: TSH 0.734 12/01/2019: BUN 32; Creatinine, Ser 1.82; Hemoglobin 11.5; Platelets 212; Potassium 4.2; Sodium 141   Lipid Panel    Component Value Date/Time   CHOL 164 09/17/2018 0000   TRIG 145 09/17/2018 0000   HDL 44 09/17/2018 0000   CHOLHDL 2.9 07/15/2008 0528   VLDL 23  07/15/2008 0528   LDLCALC 95 09/17/2018 0000    Additional studies/ records that were reviewed today include:   Echocardiogram: 09/2015 Study Conclusions   - Left ventricle: The cavity size was normal. Wall thickness was  normal. Systolic function was normal. The estimated ejection  fraction was in the range of 55% to 60%. Wall motion was normal;  there were no regional wall motion abnormalities. Doppler  parameters are consistent with abnormal left ventricular  relaxation (grade 1 diastolic dysfunction).  - Aortic valve: Trileaflet; mildly calcified leaflets.  - Mitral valve: Mildly thickened leaflets . There was mild  regurgitation.  - Left atrium: The atrium was at the upper limits of normal in  size.  - Right atrium: Central venous pressure (est): 3 mm Hg.  - Tricuspid valve: There was trivial regurgitation.  - Pulmonary arteries: PA peak pressure: 34 mm Hg (S).  - Pericardium, extracardiac: There was no pericardial effusion.   Impressions:   - Normal LV wall thickness with LVEF 55-60%. Grade 1 diastolic  dysfunction with intermediate LV filling pressure. Upper normal  left atrial chamber size. Mild mitral regurgitation. Trivial  tricuspid regurgitation with PASP 34 mmHg.  Event Monitor: 08/2019 Sinus rhythm  With occasional PACs, short burst SVT(5 beats)   42 to 171 bpm   Average HR 60 NO significant pauses    Assessment:    1. Essential hypertension   2. Palpitations   3. Mixed hyperlipidemia   4. Stage 3 chronic kidney disease, unspecified whether stage 3a or 3b CKD      Plan:   In order of problems listed above:  1. HTN - BP has improved since her last visit but still remains elevated at 150/90 and has been elevated when checked at home. I suspect her  stress levels are contributing to this as described above. She is currently on Amlodipine 5 mg daily, Coreg 6.25 mg twice daily and HCTZ 25 mg daily. Unable to further titrate Coreg at this  time given her heart rate in the 50's. Will increase Amlodipine from 5 mg daily to 10 mg daily. HCTZ was initiated by her prior PCP and she has been continued on this for now given her stable renal function and issues with lower extremity edema in the past. If edema worsens with the higher dose of Amlodipine, will need to consider other options. Previously intolerant to Hydralazine and unable to titrate her BB, so Clonidine might be the next option as she has not been on an ACE-I or ARB due to her variable renal function.   2.  Palpitations - Prior monitor earlier this year showed occasional PAC's and short bursts of SVT but no significant arrhythmias or pauses. She remains on Coreg 6.25 mg twice daily. Would not further titrate given baseline heart rate in the 50's. She previously had worsening symptoms when on Verapamil and Cardizem in the past.  3. HLD - Followed by her PCP. FLP in 08/2018 showed total cholesterol 164, triglycerides 145, HDL 44 and LDL 95. Continue Atorvastatin 20mg  daily.   4. Stage 3 CKD - Baseline creatinine 1.7 - 1.9. Stable at 1.82 by recent labs on 12/01/2019. We discussed a referral to Nephrology but she reports she is establishing with a new PCP next week and she wishes to await their recommendations initially.   Medication Adjustments/Labs and Tests Ordered: Current medicines are reviewed at length with the patient today.  Concerns regarding medicines are outlined above.  Medication changes, Labs and Tests ordered today are listed in the Patient Instructions below. Patient Instructions  Medication Instructions:   INCREASE Amlodipine to 10 mg daily   *If you need a refill on your cardiac medications before your next appointment, please call your pharmacy*   Lab Work: None today If you have labs (blood work) drawn today and your tests are completely normal, you will receive your results only by: Marland Kitchen MyChart Message (if you have MyChart) OR . A paper copy in the  mail If you have any lab test that is abnormal or we need to change your treatment, we will call you to review the results.   Testing/Procedures: None today   Follow-Up: At [facility], you and your health needs are our priority.  As part of our continuing mission to provide you with exceptional heart care, we have created designated Provider Care Teams.  These Care Teams include your primary Cardiologist (physician) and Advanced Practice Providers (APPs -  Physician Assistants and Nurse Practitioners) who all work together to provide you with the care you need, when you need it.  We recommend signing up for the patient portal called "MyChart".  Sign up information is provided on this After Visit Summary.  MyChart is used to connect with patients for Virtual Visits (Telemedicine).  Patients are able to view lab/test results, encounter notes, upcoming appointments, etc.  Non-urgent messages can be sent to your provider as well.   To learn more about what you can do with MyChart, go to [email or web address].    Your next appointment:   4-5 month(s)  The format for your next appointment:   In Person  Provider:   Dorris Carnes, MD or Bernerd Pho, PA-C   Other Instructions None   Thank you for choosing [facility] !  Signed, Erma Heritage, PA-C  12/17/2019 5:23 PM    [place] [address] [place], [place] [address] Phone: [phone] Fax: [phone]

## 2019-12-17 NOTE — Patient Instructions (Signed)
Medication Instructions:   INCREASE Amlodipine to 10 mg daily   *If you need a refill on your cardiac medications before your next appointment, please call your pharmacy*   Lab Work: None today If you have labs (blood work) drawn today and your tests are completely normal, you will receive your results only by: Marland Kitchen MyChart Message (if you have MyChart) OR . A paper copy in the mail If you have any lab test that is abnormal or we need to change your treatment, we will call you to review the results.   Testing/Procedures: None today   Follow-Up: At [facility], you and your health needs are our priority.  As part of our continuing mission to provide you with exceptional heart care, we have created designated Provider Care Teams.  These Care Teams include your primary Cardiologist (physician) and Advanced Practice Providers (APPs -  Physician Assistants and Nurse Practitioners) who all work together to provide you with the care you need, when you need it.  We recommend signing up for the patient portal called "MyChart".  Sign up information is provided on this After Visit Summary.  MyChart is used to connect with patients for Virtual Visits (Telemedicine).  Patients are able to view lab/test results, encounter notes, upcoming appointments, etc.  Non-urgent messages can be sent to your provider as well.   To learn more about what you can do with MyChart, go to [email or web address].    Your next appointment:   4-5 month(s)  The format for your next appointment:   In Person  Provider:   Dorris Carnes, MD or Bernerd Pho, PA-C   Other Instructions None    Thank you for choosing [facility] !

## 2019-12-22 ENCOUNTER — Encounter (HOSPITAL_COMMUNITY)
Admission: RE | Admit: 2019-12-22 | Discharge: 2019-12-22 | Disposition: A | Discharge status: Home / Self Care | Payer: Medicare Other | Source: Other Acute Inpatient Hospital | Attending: Internal Medicine | Admitting: Internal Medicine

## 2019-12-22 ENCOUNTER — Encounter: Payer: Self-pay | Admitting: Internal Medicine

## 2019-12-22 ENCOUNTER — Ambulatory Visit (INDEPENDENT_AMBULATORY_CARE_PROVIDER_SITE_OTHER): Payer: Medicare Other | Admitting: Internal Medicine

## 2019-12-22 ENCOUNTER — Other Ambulatory Visit: Payer: Self-pay

## 2019-12-22 VITALS — BP 152/82 | HR 60 | Temp 97.7°F | Resp 16 | Ht 63.0 in | Wt 150.0 lb

## 2019-12-22 DIAGNOSIS — E782 Mixed hyperlipidemia: Secondary | ICD-10-CM

## 2019-12-22 DIAGNOSIS — I10 Essential (primary) hypertension: Secondary | ICD-10-CM

## 2019-12-22 DIAGNOSIS — M797 Fibromyalgia: Secondary | ICD-10-CM

## 2019-12-22 DIAGNOSIS — G8929 Other chronic pain: Secondary | ICD-10-CM

## 2019-12-22 DIAGNOSIS — J449 Chronic obstructive pulmonary disease, unspecified: Secondary | ICD-10-CM | POA: Diagnosis not present

## 2019-12-22 DIAGNOSIS — E039 Hypothyroidism, unspecified: Secondary | ICD-10-CM

## 2019-12-22 DIAGNOSIS — M25562 Pain in left knee: Secondary | ICD-10-CM

## 2019-12-22 DIAGNOSIS — M170 Bilateral primary osteoarthritis of knee: Secondary | ICD-10-CM

## 2019-12-22 DIAGNOSIS — N3000 Acute cystitis without hematuria: Secondary | ICD-10-CM

## 2019-12-22 DIAGNOSIS — Z7689 Persons encountering health services in other specified circumstances: Secondary | ICD-10-CM

## 2019-12-22 DIAGNOSIS — G894 Chronic pain syndrome: Secondary | ICD-10-CM | POA: Insufficient documentation

## 2019-12-22 DIAGNOSIS — M25561 Pain in right knee: Secondary | ICD-10-CM

## 2019-12-22 DIAGNOSIS — R102 Pelvic and perineal pain unspecified side: Secondary | ICD-10-CM

## 2019-12-22 DIAGNOSIS — F418 Other specified anxiety disorders: Secondary | ICD-10-CM

## 2019-12-22 DIAGNOSIS — K5900 Constipation, unspecified: Secondary | ICD-10-CM

## 2019-12-22 DIAGNOSIS — N184 Chronic kidney disease, stage 4 (severe): Secondary | ICD-10-CM

## 2019-12-22 LAB — POCT URINALYSIS DIP (CLINITEK)
Bilirubin, UA: NEGATIVE
Glucose, UA: NEGATIVE mg/dL
Ketones, POC UA: NEGATIVE mg/dL
Nitrite, UA: POSITIVE — AB
POC PROTEIN,UA: >=300 — AB
Spec Grav, UA: >=1.03 — AB (ref 1.010–1.025)
Urobilinogen, UA: 0.2 E.U./dL
pH, UA: 6 (ref 5.0–8.0)

## 2019-12-22 MED ORDER — CIPROFLOXACIN HCL 500 MG PO TABS: 500.0000 mg | ORAL_TABLET | Freq: Two times a day (BID) | ORAL | 0 refills | Status: AC

## 2019-12-22 MED ORDER — DULOXETINE HCL 30 MG PO CPEP: 30.0000 mg | ORAL_CAPSULE | Freq: Every day | ORAL | 0 refills | Status: DC

## 2019-12-22 MED ORDER — ESTRADIOL 0.1 MG/GM VA CREA: 1.0000 | TOPICAL_CREAM | Freq: Every day | VAGINAL | 5 refills | Status: DC

## 2019-12-22 NOTE — Assessment & Plan Note (Addendum)
TSH: 0.734 (08/2019) On Levothyroxine 100 mcg QD Will check TSH and T4 Obtain thyroid US to r/o thyroid nodule (c/o dysphagia)

## 2019-12-22 NOTE — Assessment & Plan Note (Signed)
Well controlled with Linzess 

## 2019-12-22 NOTE — Patient Instructions (Signed)
You are advised to start taking Ciprofloxacin as directed for UTI. Please take adequate fluid to stay hydrated, at least 1.5 l of water.  Please follow up with Dr Merlene Laughter for chronic pain management.  Please follow up with Nephrology clinic for Chronic kidney disease. You are being referred to get Thyroid ultrasound and thyroid blood tests done.  Please follow low-salt, low-cholesterol diet. Please keep the log of blood pressure at home and bring it in the next visit.

## 2019-12-22 NOTE — Assessment & Plan Note (Signed)
Started Duloxetine On chronic opioids, will try to titrate down Norco Pain clinic referral provided

## 2019-12-22 NOTE — Assessment & Plan Note (Signed)
UA positive for LE and nitrites in clinic Check urine culture Did not complete Cephalexin course from ER Ciprofloxacin for 7 days

## 2019-12-22 NOTE — Assessment & Plan Note (Signed)
Controlled with Albuterol inhaler PRN

## 2019-12-22 NOTE — Assessment & Plan Note (Signed)
Care established Previous chart reviewed History and medications reviewed with the patient 

## 2019-12-22 NOTE — Progress Notes (Signed)
New Patient Office Visit  Subjective:  Patient ID: [name], female    DOB: 18-May-1935  Age: 84 y.o. MRN: [id number]  CC:  Chief Complaint  Patient presents with  . Acute Visit    new pt was being seen at [facility] clinic pt is on pain medication is not against being referred for pain management feels like she has thyroid problems going on also thinks she has a uti as she has frequent urination burning and odor this has been going on for 3 months or more has been to ER also     HPI [name] is an 84 year old female with past medical history of uncontrolled hypertension, CKD stage IV, fibromyalgia, chronic pelvic pain, chronic constipation, hypothyroidism, osteoarthritis of knees and depression presents for establishing care.  Patient states that she has been having burning sensation while peeing as well as urinary frequency for the last 3 months.  She went to ER and was given antibiotics, which she did not complete because she felt weak while taking antibiotic.  Patient denies fever, chills or hematuria.  Patient also mentions chronic knee and back pain, for which she has been taking Norco 4 times in a day.  Patient has an appointment with spine specialist in next month.  She also has history of chronic pelvic pain, for which she uses Estrace vaginal cream.  Patient follows up with [facility] cardiology for hypertension.  She had recent increase in her amlodipine dose due to uncontrolled hypertension.  Patient takes levothyroxine 88 mcg for hypothyroidism.  She thinks that her thyroid function is not well controlled and has been having swallowing difficulty at times because of that.  Patient also has a history of chronic constipation, for which she takes Linzess.  She follows up with Dr. Laural Golden.  Past Medical History:  Diagnosis Date  . Anxiety disorder, unspecified   . Arteriosclerotic cardiovascular disease (ASCVD) 2006   Nonobstructive; < 50% lesions on cath 2002; negative stress  nuclear study in 10/2004  . Asthma   . Chest pain, unspecified   . Chronic kidney disease, unspecified   . Chronic obstructive pulmonary disease, unspecified ([place])   . Chronic pelvic pain in female   . Colitis due to Clostridium difficile [place]  . COPD (chronic obstructive pulmonary disease) ([place])   . Depression with anxiety   . Diarrhea, unspecified   . Disorder of thyroid, unspecified   . Gastro-esophageal reflux disease without esophagitis   . Gastroesophageal reflux disease   . Hyperlipidemia   . Hyperlipidemia, unspecified   . Hypertension   . Hypertensive chronic kidney disease with stage 1 through stage 4 chronic kidney disease, or unspecified chronic kidney disease   . Hyperthyroidism   . Hypothyroidism   . Hypothyroidism, unspecified   . Kidney disease, chronic, stage III (moderate, EGFR 30-59 ml/min)   . Lower abdominal pain, unspecified   . Major depressive disorder, single episode, severe without psychotic features ([place])   . Major depressive disorder, single episode, unspecified   . Nausea   . Other postherpetic nervous system involvement   . Pelvic and perineal pain   . Rash and other nonspecific skin eruption   . S/P colonoscopy 2007   few diverticula, otherwise nl  . S/P colonoscopy 12/13/10   rectal, cecal polyp, left-sided diverticulosis, hyperplastic. ?anal fissure? treated empirically  . S/P endoscopy 2009   linear esophageal erosions  . S/P endoscopy 05/10/10   Question island of salmon-colored epithelium in distal esophagus ;  no Barrett's.   . Shingles   . Tobacco abuse, in remission    55-pack-year consumption; discontinued 08/2010  . Unspecified asthma, uncomplicated   . Unspecified osteoarthritis, unspecified site   . Urinary tract infection, site not specified     Past Surgical History:  Procedure Laterality Date  . ABDOMINAL HYSTERECTOMY    . BREAST LUMPECTOMY    . CATARACT EXTRACTION    . CHOLECYSTECTOMY  1962  . COLONOSCOPY  12/13/2010     Left-sided diverticulosis.  Cecal polyp, status post hot snare polypectomy/ Diminutive rectal polyp, status post cold biopsy removal tender/painful anal canal, ? occult anal fissure- Not visualized  . COLONOSCOPY N/A 10/14/2015   Diverticulosis  . ESOPHAGOGASTRODUODENOSCOPY  05/10/10   benign mucosa with mild chronic inflammation.  . ESOPHAGOGASTRODUODENOSCOPY (EGD) WITH PROPOFOL N/A 02/13/2019   Procedure: ESOPHAGOGASTRODUODENOSCOPY (EGD) WITH PROPOFOL;  Surgeon: Daneil Dolin, MD;  Location: [facility];  Service: Endoscopy;  Laterality: N/A;  3:00pm  . FLEXIBLE SIGMOIDOSCOPY  12/29/2011   Procedure: FLEXIBLE SIGMOIDOSCOPY;  Surgeon: Rogene Houston, MD;  Location: [facility];  Service: Endoscopy;  Laterality: N/A;  200-Ann notified pt to be here @ 1:00  . NISSEN FUNDOPLICATION    . YAG LASER APPLICATION Left 2/50/5397   Procedure: YAG LASER APPLICATION;  Surgeon: Williams Che, MD;  Location: [facility] OR[facility];  Service: Ophthalmology;  Laterality: Left;    Family History  Problem Relation Age of Onset  . Diabetes Mother   . Alzheimer's disease Mother   . Cancer Mother   . Heart disease Father   . Cancer Sister   . Heart disease Brother        Also mother, Father, brother and 2 sons  . Aneurysm Son   . Diabetes Son   . Hypertension Brother        also Sister x2  . Colon cancer Neg Hx     Social History   Socioeconomic History  . Marital status: Married    Spouse name: Not on file  . Number of children: 4  . Years of education: Not on file  . Highest education level: Not on file  Occupational History  . Not on file  Tobacco Use  . Smoking status: Former Smoker    Packs/day: 1.00    Years: 55.00    Pack years: 55.00    Quit date: 09/15/2010    Years since quitting: 9.2  . Smokeless tobacco: Never Used  Vaping Use  . Vaping Use: Never used  Substance and Sexual Activity  . Alcohol use: No    Alcohol/week: 0.0 standard drinks  . Drug use: No  . Sexual activity: Not  Currently  Other Topics Concern  . Not on file  Social History Narrative  . Not on file   Social Determinants of Health   Financial Resource Strain:   . Difficulty of Paying Living Expenses: Not on file  Food Insecurity:   . Worried About Charity fundraiser in the Last Year: Not on file  . Ran Out of Food in the Last Year: Not on file  Transportation Needs:   . Lack of Transportation (Medical): Not on file  . Lack of Transportation (Non-Medical): Not on file  Physical Activity:   . Days of Exercise per Week: Not on file  . Minutes of Exercise per Session: Not on file  Stress:   . Feeling of Stress : Not on file  Social Connections:   . Frequency of Communication with  Friends and Family: Not on file  . Frequency of Social Gatherings with Friends and Family: Not on file  . Attends Religious Services: Not on file  . Active Member of Clubs or Organizations: Not on file  . Attends Archivist Meetings: Not on file  . Marital Status: Not on file  Intimate Partner Violence:   . Fear of Current or Ex-Partner: Not on file  . Emotionally Abused: Not on file  . Physically Abused: Not on file  . Sexually Abused: Not on file    ROS Review of Systems  Constitutional: Negative for chills and fever.  HENT: Negative for congestion, sinus pressure, sinus pain and sore throat.   Eyes: Negative for pain and discharge.  Respiratory: Negative for cough and shortness of breath.   Cardiovascular: Negative for chest pain and palpitations.  Gastrointestinal: Negative for abdominal pain, constipation, diarrhea, nausea and vomiting.  Endocrine: Negative for polydipsia and polyuria.  Genitourinary: Positive for dysuria and frequency. Negative for hematuria.  Musculoskeletal: Negative for neck pain and neck stiffness.  Skin: Negative for rash.  Neurological: Negative for dizziness and weakness.  Psychiatric/Behavioral: Negative for agitation and suicidal ideas. The patient is  nervous/anxious.     Objective:   Today's Vitals: BP (!) 152/82 (BP Location: Left Arm, Patient Position: Sitting, Cuff Size: Normal)   Pulse 60   Temp 97.7 F (36.5 C) (Temporal)   Resp 16   Ht _0  (1.6 m)   Wt 150 lb (68 kg)   SpO2 96%   BMI 26.57 kg/m   Physical Exam Vitals reviewed.  Constitutional:      General: She is not in acute distress.    Appearance: She is not diaphoretic.  HENT:     Head: Normocephalic and atraumatic.     Nose: Nose normal.     Mouth/Throat:     Mouth: Mucous membranes are moist.  Eyes:     General: No scleral icterus.    Extraocular Movements: Extraocular movements intact.     Pupils: Pupils are equal, round, and reactive to light.  Cardiovascular:     Rate and Rhythm: Normal rate and regular rhythm.     Pulses: Normal pulses.     Heart sounds: No murmur heard.   Pulmonary:     Breath sounds: Normal breath sounds. No wheezing or rales.  Abdominal:     Palpations: Abdomen is soft.     Tenderness: There is no abdominal tenderness.  Musculoskeletal:     Cervical back: Neck supple. No tenderness.     Right lower leg: No edema.     Left lower leg: No edema.  Skin:    General: Skin is warm.     Findings: No rash.  Neurological:     General: No focal deficit present.     Mental Status: She is alert and oriented to person, place, and time.  Psychiatric:        Mood and Affect: Mood normal.        Behavior: Behavior normal.     Assessment & Plan:   Encounter to establish care Care established Previous chart reviewed History and medications reviewed with the patient   Hypertension Uncontrolled Currently on Amlodipine 10 mg QD, Coreg 6.25 mg BID and HCTZ 25 mg QD Follows up with [facility] Cardiology Advised DASH diet and continue to check BP at home  Chronic obstructive pulmonary disease, unspecified ([place]) Controlled with Albuterol inhaler PRN  Hypothyroidism TSH: 0.734 (08/2019) On Levothyroxine 100 mcg QD  Will check TSH  and T4 Obtain thyroid US to r/o thyroid nodule (c/o dysphagia)  CKD (chronic kidney disease) stage 4, GFR 15-29 ml/min ([facility]) Last CMP reviewed Advised patient for low-Na and low-K diet Follow up with Nephrology  Hyperlipidemia On Atorvastatin 20 mg QD  Depression with anxiety Not on any medications currently Will start Cymbalta considering h/o chronic pelvic pain and fibromyalgia  Fibromyalgia Started Duloxetine On chronic opioids, will try to titrate down Norco Pain clinic referral provided  Pelvic and perineal pain Start Cymbalta On Estrace gel  Constipation Well-controlled with Linzess  UTI (urinary tract infection) UA positive for LE and nitrites in clinic Check urine culture Did not complete Cephalexin course from ER Ciprofloxacin for 7 days    Outpatient Encounter Medications as of 12/22/2019  Medication Sig  . albuterol (PROVENTIL HFA;VENTOLIN HFA) 108 (90 Base) MCG/ACT inhaler Inhale 1-2 puffs into the lungs every 6 (six) hours as needed for wheezing or shortness of breath.  Marland Kitchen amLODipine (NORVASC) 10 MG tablet Take 1 tablet (10 mg total) by mouth daily.  Marland Kitchen atorvastatin (LIPITOR) 20 MG tablet Take 20 mg every evening by mouth.   . carvedilol (COREG) 6.25 MG tablet Take 1 tablet (6.25 mg total) by mouth 2 (two) times daily.  Marland Kitchen gabapentin (NEURONTIN) 100 MG capsule TAKE 1 CAPSULE BY MOUTH THREE TIMES A DAY.  . hydrochlorothiazide (HYDRODIURIL) 25 MG tablet Take 25 mg by mouth daily.  Marland Kitchen HYDROcodone-acetaminophen (NORCO) 10-325 MG tablet Take 0.5-2 tablets by mouth every 4 (four) hours as needed (1/2 tablet for mild pain, 1 tablet for moderate pain, 2 tablets for severe pain).  . hydroxypropyl methylcellulose (ISOPTO TEARS) 2.5 % ophthalmic solution Place 1 drop into both eyes 3 (three) times daily as needed. For dry eyes.  Marland Kitchen levothyroxine (SYNTHROID) 100 MCG tablet Take 100 mcg by mouth daily before breakfast.  . LINZESS 145 MCG CAPS capsule TAKE 1 CAPSULE ONCE DAILY  BEFORE BREAKFAST.  . Multiple Vitamins-Minerals (EYE VITAMINS PO) Take 1 tablet by mouth daily.   Marland Kitchen omeprazole (PRILOSEC) 40 MG capsule Take 40 mg by mouth 2 (two) times daily before a meal.  . ondansetron (ZOFRAN) 4 MG tablet Take 1 tablet (4 mg total) by mouth every 8 (eight) hours as needed for nausea or vomiting.  . potassium chloride SA (K-DUR) 20 MEQ tablet Take 20 mEq by mouth daily with lunch.   . Vitamin D, Ergocalciferol, (DRISDOL) 1.25 MG (50000 UNIT) CAPS capsule Take 50,000 Units by mouth once a week.  . ciprofloxacin (CIPRO) 500 MG tablet Take 1 tablet (500 mg total) by mouth 2 (two) times daily for 7 days.  . DULoxetine (CYMBALTA) 30 MG capsule Take 1 capsule (30 mg total) by mouth daily.  Marland Kitchen estradiol (ESTRACE VAGINAL) 0.1 MG/GM vaginal cream Place 1 Applicatorful vaginally at bedtime.   No facility-administered encounter medications on file as of 12/22/2019.    Follow-up: Return in about 6 weeks (around 02/02/2020), or if symptoms worsen or fail to improve.   Lindell Spar, MD

## 2019-12-22 NOTE — Assessment & Plan Note (Signed)
Not on any medications currently Will start Cymbalta considering h/o chronic pelvic pain and fibromyalgia

## 2019-12-22 NOTE — Assessment & Plan Note (Signed)
Uncontrolled Currently on Amlodipine 10 mg QD, Coreg 6.25 mg BID and HCTZ 25 mg QD Follows up with [facility] Cardiology Advised DASH diet and continue to check BP at home

## 2019-12-22 NOTE — Assessment & Plan Note (Signed)
Last CMP reviewed Advised patient for low-Na and low-K diet Follow up with Nephrology

## 2019-12-22 NOTE — Assessment & Plan Note (Signed)
Start Cymbalta On Estrace gel

## 2019-12-22 NOTE — Assessment & Plan Note (Signed)
On Atorvastatin 20 mg QD 

## 2019-12-23 ENCOUNTER — Other Ambulatory Visit: Payer: Self-pay | Admitting: Internal Medicine

## 2019-12-23 DIAGNOSIS — E039 Hypothyroidism, unspecified: Secondary | ICD-10-CM

## 2019-12-23 LAB — T4 AND TSH
T4, Total: 7.8 ug/dL (ref 4.5–12.0)
TSH: 0.406 u[IU]/mL — ABNORMAL LOW (ref 0.450–4.500)

## 2019-12-23 MED ORDER — LEVOTHYROXINE SODIUM 75 MCG PO TABS: 75.0000 ug | ORAL_TABLET | Freq: Every day | ORAL | 0 refills | Status: DC

## 2019-12-24 LAB — URINE CULTURE: Culture: >=100000 — AB

## 2019-12-26 ENCOUNTER — Other Ambulatory Visit (HOSPITAL_COMMUNITY): Payer: Self-pay | Admitting: *Deleted

## 2019-12-26 ENCOUNTER — Other Ambulatory Visit: Payer: Self-pay

## 2019-12-26 ENCOUNTER — Ambulatory Visit (HOSPITAL_COMMUNITY)
Admission: RE | Admit: 2019-12-26 | Discharge: 2019-12-26 | Disposition: A | Discharge status: Home / Self Care | Payer: Medicare Other | Source: Ambulatory Visit | Attending: Internal Medicine | Admitting: Internal Medicine

## 2019-12-26 DIAGNOSIS — E039 Hypothyroidism, unspecified: Secondary | ICD-10-CM | POA: Insufficient documentation

## 2020-01-07 DIAGNOSIS — I10 Essential (primary) hypertension: Secondary | ICD-10-CM | POA: Insufficient documentation

## 2020-01-07 DIAGNOSIS — F32A Depression, unspecified: Secondary | ICD-10-CM | POA: Insufficient documentation

## 2020-01-16 ENCOUNTER — Other Ambulatory Visit: Payer: Self-pay | Admitting: Internal Medicine

## 2020-01-21 ENCOUNTER — Other Ambulatory Visit: Payer: Self-pay | Admitting: Internal Medicine

## 2020-01-21 DIAGNOSIS — E039 Hypothyroidism, unspecified: Secondary | ICD-10-CM

## 2020-01-28 DIAGNOSIS — M48061 Spinal stenosis, lumbar region without neurogenic claudication: Secondary | ICD-10-CM | POA: Diagnosis not present

## 2020-01-28 DIAGNOSIS — M5416 Radiculopathy, lumbar region: Secondary | ICD-10-CM | POA: Diagnosis not present

## 2020-02-02 ENCOUNTER — Ambulatory Visit (INDEPENDENT_AMBULATORY_CARE_PROVIDER_SITE_OTHER): Payer: Medicare Other | Admitting: Internal Medicine

## 2020-02-02 ENCOUNTER — Encounter: Payer: Self-pay | Admitting: Internal Medicine

## 2020-02-02 ENCOUNTER — Other Ambulatory Visit: Payer: Self-pay

## 2020-02-02 VITALS — BP 156/84 | HR 74 | Temp 98.7°F | Resp 16 | Ht 60.0 in | Wt 145.0 lb

## 2020-02-02 DIAGNOSIS — E782 Mixed hyperlipidemia: Secondary | ICD-10-CM

## 2020-02-02 DIAGNOSIS — Z23 Encounter for immunization: Secondary | ICD-10-CM

## 2020-02-02 DIAGNOSIS — N184 Chronic kidney disease, stage 4 (severe): Secondary | ICD-10-CM

## 2020-02-02 DIAGNOSIS — K219 Gastro-esophageal reflux disease without esophagitis: Secondary | ICD-10-CM

## 2020-02-02 DIAGNOSIS — F418 Other specified anxiety disorders: Secondary | ICD-10-CM

## 2020-02-02 DIAGNOSIS — Z131 Encounter for screening for diabetes mellitus: Secondary | ICD-10-CM

## 2020-02-02 DIAGNOSIS — I10 Essential (primary) hypertension: Secondary | ICD-10-CM

## 2020-02-02 DIAGNOSIS — E559 Vitamin D deficiency, unspecified: Secondary | ICD-10-CM

## 2020-02-02 DIAGNOSIS — E039 Hypothyroidism, unspecified: Secondary | ICD-10-CM

## 2020-02-02 LAB — POCT GLYCOSYLATED HEMOGLOBIN (HGB A1C)
HbA1c POC (<> result, manual entry): 5.6 % (ref 4.0–5.6)
HbA1c, POC (controlled diabetic range): 5.6 % (ref 0.0–7.0)
HbA1c, POC (prediabetic range): 5.6 % — AB (ref 5.7–6.4)
Hemoglobin A1C: 5.6 % (ref 4.0–5.6)

## 2020-02-02 NOTE — Assessment & Plan Note (Signed)
Uncontrolled, pain contributing to the high BP and patient has not taken medicine this morning. Currently on Amlodipine 10 mg QD, Coreg 6.25 mg BID and HCTZ 25 mg QD Follows up with [facility] Cardiology Advised DASH diet and continue to check BP at home

## 2020-02-02 NOTE — Assessment & Plan Note (Signed)
On vitamin D 50,000 IU every week.

## 2020-02-02 NOTE — Assessment & Plan Note (Signed)
On Atorvastatin 20 mg QD 

## 2020-02-02 NOTE — Assessment & Plan Note (Signed)
Decreased Levothyroxine to 75 mcg QD Check TSH and T4 in the next visit

## 2020-02-02 NOTE — Patient Instructions (Signed)
Please continue to take medications regularly.  Please follow up with Nephrologist as scheduled.  Please follow low sodium diet to help control BP. Please check BP at home and bring the log in the next visit.

## 2020-02-02 NOTE — Assessment & Plan Note (Signed)
Last CMP reviewed Advised patient for low-Na and low-K diet Follow up with Nephrology

## 2020-02-02 NOTE — Assessment & Plan Note (Signed)
Better with Cymbalta 30 mg QD

## 2020-02-02 NOTE — Assessment & Plan Note (Signed)
On omeprazole.  

## 2020-02-02 NOTE — Progress Notes (Signed)
Established Patient Office Visit  Subjective:  Patient ID: [name], female    DOB: [birth year]  Age: 84 y.o. MRN: [id number]  CC:  Chief Complaint  Patient presents with   Follow-up    6 week follow up still feeling weak also would like to discuss thyroid ultrasound that was recently taken     HPI [name] with past medical history of uncontrolled hypertension, CKD stage IV, fibromyalgia, chronic pelvic pain, chronic constipation, hypothyroidism, osteoarthritis of knees and depression presents for follow up of her hypertension and fatigue.  Her BP is 156/84.  She denies any headache, dizziness, chest pain, dyspnea or palpitations.  She has not taken her medications this morning.  Of note, patient also has chronic knee pain.  Patient states that her mood is better with Cymbalta now.  It has also helped her with chronic diffuse pain.  Past Medical History:  Diagnosis Date   Anxiety disorder, unspecified    Arteriosclerotic cardiovascular disease (ASCVD) 2006   Nonobstructive; < 50% lesions on cath 2002; negative stress nuclear study in 10/2004   Asthma    Chest pain, unspecified    Chronic kidney disease, unspecified    Chronic obstructive pulmonary disease, unspecified ([place])    Chronic pelvic pain in female    Colitis due to Clostridium difficile 1994   1994   COPD (chronic obstructive pulmonary disease) ([place])    Depression with anxiety    Diarrhea, unspecified    Disorder of thyroid, unspecified    Gastro-esophageal reflux disease without esophagitis    Gastroesophageal reflux disease    Hyperlipidemia    Hyperlipidemia, unspecified    Hypertension    Hypertensive chronic kidney disease with stage 1 through stage 4 chronic kidney disease, or unspecified chronic kidney disease    Hyperthyroidism    Hypothyroidism    Hypothyroidism, unspecified    Kidney disease, chronic, stage III (moderate, EGFR 30-59 ml/min) (HCC)    Lower abdominal  pain, unspecified    Major depressive disorder, single episode, severe without psychotic features ([place])    Major depressive disorder, single episode, unspecified    Nausea    Other postherpetic nervous system involvement    Pelvic and perineal pain    Rash and other nonspecific skin eruption    S/P colonoscopy 2007   few diverticula, otherwise nl   S/P colonoscopy 12/13/10   rectal, cecal polyp, left-sided diverticulosis, hyperplastic. ?anal fissure? treated empirically   S/P endoscopy 2009   linear esophageal erosions   S/P endoscopy 05/10/10   Question island of salmon-colored epithelium in distal esophagus ; no Barrett's.    Shingles    Tobacco abuse, in remission    55-pack-year consumption; discontinued 08/2010   Unspecified asthma, uncomplicated    Unspecified osteoarthritis, unspecified site    Urinary tract infection, site not specified     Past Surgical History:  Procedure Laterality Date   ABDOMINAL HYSTERECTOMY     BREAST LUMPECTOMY     CATARACT EXTRACTION     CHOLECYSTECTOMY  1962   COLONOSCOPY  12/13/2010   Left-sided diverticulosis.  Cecal polyp, status post hot snare polypectomy/ Diminutive rectal polyp, status post cold biopsy removal tender/painful anal canal, ? occult anal fissure- Not visualized   COLONOSCOPY N/A 10/14/2015   Diverticulosis   ESOPHAGOGASTRODUODENOSCOPY  05/10/10   benign mucosa with mild chronic inflammation.   ESOPHAGOGASTRODUODENOSCOPY (EGD) WITH PROPOFOL N/A 02/13/2019   Procedure: ESOPHAGOGASTRODUODENOSCOPY (EGD) WITH PROPOFOL;  Surgeon: Daneil Dolin, MD;  Location: [facility];  Service: Endoscopy;  Laterality: N/A;  3:00pm   FLEXIBLE SIGMOIDOSCOPY  12/29/2011   Procedure: FLEXIBLE SIGMOIDOSCOPY;  Surgeon: Rogene Houston, MD;  Location: [facility];  Service: Endoscopy;  Laterality: N/A;  200-Ann notified pt to be here @ 1:24   NISSEN FUNDOPLICATION     YAG LASER APPLICATION Left 5/80/9983   Procedure: YAG  LASER APPLICATION;  Surgeon: Williams Che, MD;  Location: [facility];  Service: Ophthalmology;  Laterality: Left;    Family History  Problem Relation Age of Onset   Diabetes Mother    Alzheimer's disease Mother    Cancer Mother    Heart disease Father    Cancer Sister    Heart disease Brother        Also mother, Father, brother and 2 sons   Aneurysm Son    Diabetes Son    Hypertension Brother        also Sister x2   Colon cancer Neg Hx     Social History   Socioeconomic History   Marital status: Married    Spouse name: Not on file   Number of children: 4   Years of education: Not on file   Highest education level: Not on file  Occupational History   Not on file  Tobacco Use   Smoking status: Former Smoker    Packs/day: 1.00    Years: 55.00    Pack years: 55.00    Quit date: 09/15/2010    Years since quitting: 9.3   Smokeless tobacco: Never Used  Vaping Use   Vaping Use: Never used  Substance and Sexual Activity   Alcohol use: No    Alcohol/week: 0.0 standard drinks   Drug use: No   Sexual activity: Not Currently  Other Topics Concern   Not on file  Social History Narrative   Not on file   Social Determinants of Health   Financial Resource Strain:    Difficulty of Paying Living Expenses: Not on file  Food Insecurity:    Worried About Charity fundraiser in the Last Year: Not on file   [employer] of Food in the Last Year: Not on file  Transportation Needs:    Lack of Transportation (Medical): Not on file   Lack of Transportation (Non-Medical): Not on file  Physical Activity:    Days of Exercise per Week: Not on file   Minutes of Exercise per Session: Not on file  Stress:    Feeling of Stress : Not on file  Social Connections:    Frequency of Communication with Friends and Family: Not on file   Frequency of Social Gatherings with Friends and Family: Not on file   Attends Religious Services: Not on file   Active Member of  [employer] or Organizations: Not on file   Attends Archivist Meetings: Not on file   Marital Status: Not on file  Intimate Partner Violence:    Fear of Current or Ex-Partner: Not on file   Emotionally Abused: Not on file   Physically Abused: Not on file   Sexually Abused: Not on file    Outpatient Medications Prior to Visit  Medication Sig Dispense Refill   albuterol (PROVENTIL HFA;VENTOLIN HFA) 108 (90 Base) MCG/ACT inhaler Inhale 1-2 puffs into the lungs every 6 (six) hours as needed for wheezing or shortness of breath.     amLODipine (NORVASC) 10 MG tablet Take 1 tablet (10 mg total) by mouth daily. 180 tablet 3  atorvastatin (LIPITOR) 20 MG tablet Take 20 mg every evening by mouth.      carvedilol (COREG) 6.25 MG tablet Take 1 tablet (6.25 mg total) by mouth 2 (two) times daily. 180 tablet 3   estradiol (ESTRACE VAGINAL) 0.1 MG/GM vaginal cream Place 1 Applicatorful vaginally at bedtime. 42.5 g 5   gabapentin (NEURONTIN) 100 MG capsule TAKE 1 CAPSULE BY MOUTH THREE TIMES A DAY. 90 capsule 0   hydrochlorothiazide (HYDRODIURIL) 25 MG tablet Take 25 mg by mouth daily.     HYDROcodone-acetaminophen (NORCO) 10-325 MG tablet Take 0.5-2 tablets by mouth every 4 (four) hours as needed (1/2 tablet for mild pain, 1 tablet for moderate pain, 2 tablets for severe pain). 80 tablet 0   hydroxypropyl methylcellulose (ISOPTO TEARS) 2.5 % ophthalmic solution Place 1 drop into both eyes 3 (three) times daily as needed. For dry eyes.     levothyroxine (SYNTHROID) 75 MCG tablet TAKE (1) TABLET BY MOUTH EACH MORNING. 90 tablet 0   LINZESS 145 MCG CAPS capsule TAKE 1 CAPSULE ONCE DAILY BEFORE BREAKFAST. 90 capsule 3   Multiple Vitamins-Minerals (EYE VITAMINS PO) Take 1 tablet by mouth daily.      omeprazole (PRILOSEC) 40 MG capsule TAKE (1) CAPSULE BY MOUTH TWICE DAILY FOR REFLUX. 180 capsule 0   ondansetron (ZOFRAN) 4 MG tablet Take 1 tablet (4 mg total) by mouth every 8 (eight)  hours as needed for nausea or vomiting. 60 tablet 2   potassium chloride SA (K-DUR) 20 MEQ tablet Take 20 mEq by mouth daily with lunch.      Vitamin D, Ergocalciferol, (DRISDOL) 1.25 MG (50000 UNIT) CAPS capsule Take 50,000 Units by mouth once a week.     DULoxetine (CYMBALTA) 30 MG capsule Take 1 capsule (30 mg total) by mouth daily. (Patient not taking: Reported on 02/02/2020) 90 capsule 0   No facility-administered medications prior to visit.    Allergies  Allergen Reactions   Amoxicillin    Hydralazine     Breathing   Paroxetine Itching   Sulfa Antibiotics Other (See Comments)    Childhood reaction.   Venlafaxine Itching   Nalbuphine Rash    ROS Review of Systems  Constitutional: Negative for chills and fever.  HENT: Negative for congestion, sinus pressure, sinus pain and sore throat.   Eyes: Negative for pain and discharge.  Respiratory: Negative for cough and shortness of breath.   Cardiovascular: Negative for chest pain and palpitations.  Gastrointestinal: Negative for abdominal pain, constipation, diarrhea, nausea and vomiting.  Endocrine: Negative for polydipsia and polyuria.  Genitourinary: Negative for dysuria, frequency and hematuria.  Musculoskeletal: Negative for neck pain and neck stiffness.  Skin: Negative for rash.  Neurological: Negative for dizziness and weakness.  Psychiatric/Behavioral: Negative for agitation and suicidal ideas. The patient is not nervous/anxious.       Objective:    Physical Exam Vitals reviewed.  Constitutional:      General: She is not in acute distress.    Appearance: She is not diaphoretic.  HENT:     Head: Normocephalic and atraumatic.     Nose: Nose normal.     Mouth/Throat:     Mouth: Mucous membranes are moist.  Eyes:     General: No scleral icterus.    Extraocular Movements: Extraocular movements intact.     Pupils: Pupils are equal, round, and reactive to light.  Cardiovascular:     Rate and Rhythm: Normal  rate and regular rhythm.     Pulses: Normal pulses.  Heart sounds: No murmur heard.   Pulmonary:     Breath sounds: Normal breath sounds. No wheezing or rales.  Abdominal:     Palpations: Abdomen is soft.     Tenderness: There is no abdominal tenderness.  Musculoskeletal:     Cervical back: Neck supple. No tenderness.     Right lower leg: No edema.     Left lower leg: No edema.  Skin:    General: Skin is warm.     Findings: No rash.  Neurological:     General: No focal deficit present.     Mental Status: She is alert and oriented to person, place, and time.     Sensory: No sensory deficit.     Motor: No weakness.  Psychiatric:        Mood and Affect: Mood normal.        Behavior: Behavior normal.     BP (!) 156/84 (BP Location: Left Arm, Patient Position: Sitting)    Pulse 74    Temp 98.7 F (37.1 C) (Oral)    Resp 16    Ht 5' (1.524 m)    Wt 145 lb (65.8 kg)    SpO2 97%    BMI 28.32 kg/m  Wt Readings from Last 3 Encounters:  02/02/20 145 lb (65.8 kg)  12/22/19 150 lb (68 kg)  12/17/19 149 lb (67.6 kg)     Health Maintenance Due  Topic Date Due   COVID-19 Vaccine (1) Never done   TETANUS/TDAP  Never done    There are no preventive care reminders to display for this patient.  Lab Results  Component Value Date   TSH 0.406 (L) 12/22/2019   Lab Results  Component Value Date   WBC 6.3 12/01/2019   HGB 11.5 (L) 12/01/2019   HCT 36.7 12/01/2019   MCV 93.9 12/01/2019   PLT 212 12/01/2019   Lab Results  Component Value Date   NA 141 12/01/2019   K 4.2 12/01/2019   CO2 23 12/01/2019   GLUCOSE 116 (H) 12/01/2019   BUN 32 (H) 12/01/2019   CREATININE 1.82 (H) 12/01/2019   BILITOT 0.4 11/26/2018   ALKPHOS 50 11/26/2018   AST 12 (L) 11/26/2018   ALT 12 11/26/2018   PROT 6.6 11/26/2018   ALBUMIN 3.5 11/26/2018   CALCIUM 8.9 12/01/2019   ANIONGAP 8 12/01/2019   Lab Results  Component Value Date   CHOL 164 09/17/2018   Lab Results  Component Value  Date   HDL 44 09/17/2018   Lab Results  Component Value Date   LDLCALC 95 09/17/2018   Lab Results  Component Value Date   TRIG 145 09/17/2018   Lab Results  Component Value Date   CHOLHDL 2.9 07/15/2008   Lab Results  Component Value Date   HGBA1C 5.6 02/02/2020   HGBA1C 5.6 02/02/2020   HGBA1C 5.6 (A) 02/02/2020   HGBA1C 5.6 02/02/2020      Assessment & Plan:   Problem List Items Addressed This Visit      Cardiovascular and Mediastinum   Hypertension - Primary    Uncontrolled, pain contributing to the high BP and patient has not taken medicine this morning. Currently on Amlodipine 10 mg QD, Coreg 6.25 mg BID and HCTZ 25 mg QD Follows up with [facility] Advised DASH diet and continue to check BP at home        Digestive   Gastro-esophageal reflux disease without esophagitis    On omeprazole  Endocrine   Hypothyroidism, unspecified Decreased Levothyroxine to 75 mcg QD Check TSH and T4 in the next visit      Genitourinary   CKD (chronic kidney disease) stage 4, GFR 15-29 ml/min (HCC)    Last CMP reviewed Advised patient for low-Na and low-K diet Follow up with Nephrology        Other   Hyperlipidemia    On Atorvastatin 20 mg QD      Depression with anxiety    Better with Cymbalta 30 mg QD      Vitamin D deficiency    On vitamin D 50,000 IU every week.       Other Visit Diagnoses    Diabetes mellitus screening       Relevant Orders   POCT glycosylated hemoglobin (Hb A1C) (Completed)   Need for immunization against influenza       Relevant Orders   Flu Vaccine QUAD High Dose(Fluad) (Completed)      No orders of the defined types were placed in this encounter.   Follow-up: Return in about 6 weeks (around 03/15/2020).    Lindell Spar, MD

## 2020-02-05 ENCOUNTER — Ambulatory Visit: Payer: Medicare Other | Admitting: Orthopedic Surgery

## 2020-02-11 DIAGNOSIS — Z79891 Long term (current) use of opiate analgesic: Secondary | ICD-10-CM | POA: Diagnosis not present

## 2020-02-11 DIAGNOSIS — M797 Fibromyalgia: Secondary | ICD-10-CM | POA: Diagnosis not present

## 2020-02-11 DIAGNOSIS — G894 Chronic pain syndrome: Secondary | ICD-10-CM | POA: Diagnosis not present

## 2020-02-11 DIAGNOSIS — M130 Polyarthritis, unspecified: Secondary | ICD-10-CM | POA: Diagnosis not present

## 2020-02-23 DIAGNOSIS — I129 Hypertensive chronic kidney disease with stage 1 through stage 4 chronic kidney disease, or unspecified chronic kidney disease: Secondary | ICD-10-CM | POA: Diagnosis not present

## 2020-02-23 DIAGNOSIS — N390 Urinary tract infection, site not specified: Secondary | ICD-10-CM | POA: Diagnosis not present

## 2020-02-23 DIAGNOSIS — N1832 Chronic kidney disease, stage 3b: Secondary | ICD-10-CM | POA: Diagnosis not present

## 2020-03-03 DIAGNOSIS — M791 Myalgia, unspecified site: Secondary | ICD-10-CM | POA: Diagnosis not present

## 2020-03-03 DIAGNOSIS — M7138 Other bursal cyst, other site: Secondary | ICD-10-CM | POA: Insufficient documentation

## 2020-03-03 DIAGNOSIS — M4316 Spondylolisthesis, lumbar region: Secondary | ICD-10-CM | POA: Diagnosis not present

## 2020-03-05 DIAGNOSIS — N1832 Chronic kidney disease, stage 3b: Secondary | ICD-10-CM | POA: Diagnosis not present

## 2020-03-08 NOTE — Progress Notes (Signed)
[place]   Telephone:([phone]) [phone] Fax:([phone]) [place] Note   Patient Care Team: Lindell Spar, MD as PCP - General (Internal Medicine) Fay Records, MD as PCP - Cardiology (Cardiology) Gala Romney Cristopher Estimable, MD (Gastroenterology) Yehuda Savannah, MD (Inactive) (Cardiology)  Date of Service:  03/09/2020   CHIEF COMPLAINTS/PURPOSE OF CONSULTATION:  Elevated Light chains  REFERRING PHYSICIAN:  Nephrologist Dr. Joelyn Oms   HISTORY OF PRESENTING ILLNESS:  [name] 84 y.o. female is a here because of elevated light chains which was found on lab. The patient was referred by Nephrologist Dr. Joelyn Oms. The patient presents to the clinic today accompanied by her son.   She was referred to nephrologist Dr. Joelyn Oms recently for her chronic kidney disease, which has been going on for over 10 years, likely secondary to her hypertension.  During the work-up for CKD, she was found to have elevated kappa light chain level at 101.4 mg/L (normal 3.3-19.4), and slightly elevated lambda light chain at 30 mg/L (5.7-26.3), and increased ratio at 3.31.  Her SPEP was negative for M protein.  Her urine test also showed 3+ protein.  She was referred for further evaluation of the elevated light chain level.  Her past medical history is significant for for hypertension and CKD.  She has history of heavy smoking and mild COPD, not on oxygen.  She has osteoarthritis, with chronic low back pain for the last year no worse lately, and mild hip and knee pain for which she had injections.  No history of fracture.  She is 62, lives with one of her sons, still independent for her ADLs and light house chores.  Review of system reviewed mild to moderate fatigue, slightly low appetite, weight is stable.  She also has noticed intermittent left upper quadrant abdominal pain, usually last for few minutes, along with mild intermittent nausea, no constipation or diarrhea. She was seen by GI  before.    REVIEW OF SYSTEMS:    Constitutional: Denies fevers, chills or abnormal night sweats, moderate fatigue. Eyes: Denies blurriness of vision, double vision or watery eyes Ears, nose, mouth, throat, and face: Denies mucositis or sore throat Respiratory: Denies cough, dyspnea or wheezes Cardiovascular: Denies palpitation, chest discomfort or lower extremity swelling Gastrointestinal:  Denies nausea, heartburn or change in bowel habits, mild intermittent left upper quadrant pain and nausea. Skin: Denies abnormal skin rashes Lymphatics: Denies new lymphadenopathy or easy bruising Neurological:Denies numbness, tingling or new weaknesses Behavioral/Psych: Mood is stable, no new changes  All other systems were reviewed with the patient and are negative.   MEDICAL HISTORY:  Past Medical History:  Diagnosis Date   Anxiety disorder, unspecified    Arteriosclerotic cardiovascular disease (ASCVD) 2006   Nonobstructive; < 50% lesions on cath 2002; negative stress nuclear study in 10/2004   Asthma    Chest pain, unspecified    Chronic kidney disease, unspecified    Chronic obstructive pulmonary disease, unspecified ([place])    Chronic pelvic pain in female    Colitis due to Clostridium difficile 1994   1994   COPD (chronic obstructive pulmonary disease) ([place])    Depression with anxiety    Diarrhea, unspecified    Disorder of thyroid, unspecified    Gastro-esophageal reflux disease without esophagitis    Gastroesophageal reflux disease    Hyperlipidemia    Hyperlipidemia, unspecified    Hypertension    Hypertensive chronic kidney disease with stage 1 through stage 4 chronic kidney disease, or  unspecified chronic kidney disease    Hyperthyroidism    Hypothyroidism    Hypothyroidism, unspecified    Kidney disease, chronic, stage III (moderate, EGFR 30-59 ml/min) (HCC)    Lower abdominal pain, unspecified    Major depressive disorder, single episode, severe  without psychotic features ([place])    Major depressive disorder, single episode, unspecified    Nausea    Other postherpetic nervous system involvement    Pelvic and perineal pain    Rash and other nonspecific skin eruption    S/P colonoscopy 2007   few diverticula, otherwise nl   S/P colonoscopy 12/13/10   rectal, cecal polyp, left-sided diverticulosis, hyperplastic. ?anal fissure? treated empirically   S/P endoscopy 2009   linear esophageal erosions   S/P endoscopy 05/10/10   Question island of salmon-colored epithelium in distal esophagus ; no Barrett's.    Shingles    Tobacco abuse, in remission    55-pack-year consumption; discontinued 08/2010   Unspecified asthma, uncomplicated    Unspecified osteoarthritis, unspecified site    Urinary tract infection, site not specified     SURGICAL HISTORY: Past Surgical History:  Procedure Laterality Date   ABDOMINAL HYSTERECTOMY     BREAST LUMPECTOMY     CATARACT EXTRACTION     CHOLECYSTECTOMY  1962   COLONOSCOPY  12/13/2010   Left-sided diverticulosis.  Cecal polyp, status post hot snare polypectomy/ Diminutive rectal polyp, status post cold biopsy removal tender/painful anal canal, ? occult anal fissure- Not visualized   COLONOSCOPY N/A 10/14/2015   Diverticulosis   ESOPHAGOGASTRODUODENOSCOPY  05/10/10   benign mucosa with mild chronic inflammation.   ESOPHAGOGASTRODUODENOSCOPY (EGD) WITH PROPOFOL N/A 02/13/2019   Procedure: ESOPHAGOGASTRODUODENOSCOPY (EGD) WITH PROPOFOL;  Surgeon: Daneil Dolin, MD;  [facility];  Service: Endoscopy;  Laterality: N/A;  3:00pm   FLEXIBLE SIGMOIDOSCOPY  12/29/2011   Procedure: FLEXIBLE SIGMOIDOSCOPY;  Surgeon: Rogene Houston, MD;  Location: [facility];  Service: Endoscopy;  Laterality: N/A;  200-Ann notified pt to be here @ 8:02   NISSEN FUNDOPLICATION     YAG LASER APPLICATION Left 2/33/6122   Procedure: YAG LASER APPLICATION;  Surgeon: Williams Che, MD;   Location: [facility];  Service: Ophthalmology;  Laterality: Left;    SOCIAL HISTORY: Social History   Socioeconomic History   Marital status: Married    Spouse name: Not on file   Number of children: 4   Years of education: Not on file   Highest education level: Not on file  Occupational History   Not on file  Tobacco Use   Smoking status: Former Smoker    Packs/day: 1.00    Years: 55.00    Pack years: 55.00    Quit date: 09/15/2010    Years since quitting: 9.4   Smokeless tobacco: Never Used  Vaping Use   Vaping Use: Never used  Substance and Sexual Activity   Alcohol use: No    Alcohol/week: 0.0 standard drinks   Drug use: No   Sexual activity: Not Currently  Other Topics Concern   Not on file  Social History Narrative   Not on file   Social Determinants of Health   Financial Resource Strain: Low Risk    Difficulty of Paying Living Expenses: Not hard at all  Food Insecurity: No Food Insecurity   Worried About Charity fundraiser in the Last Year: Never true   [place] in the Last Year: Never true  Transportation Needs: No Transportation Needs  Lack of Transportation (Medical): No   Lack of Transportation (Non-Medical): No  Physical Activity: Insufficiently Active   Days of Exercise per Week: 7 days   Minutes of Exercise per Session: 20 min  Stress: Stress Concern Present   Feeling of Stress : Rather much  Social Connections: Moderately Integrated   Frequency of Communication with Friends and Family: More than three times a week   Frequency of Social Gatherings with Friends and Family: More than three times a week   Attends Religious Services: More than 4 times per year   Active Member of [employer] or Organizations: No   Attends Music therapist: Never   Marital Status: Married  Human resources officer Violence: Not At Risk   Fear of Current or Ex-Partner: No   Emotionally Abused: No   Physically Abused: No   Sexually  Abused: No    FAMILY HISTORY: Family History  Problem Relation Age of Onset   Diabetes Mother    Alzheimer's disease Mother    Cancer Mother    Heart disease Father    Cancer Sister    Heart disease Brother        Also mother, Father, brother and 2 sons   Aneurysm Son    Diabetes Son    Hypertension Brother        also Sister x2   Colon cancer Neg Hx     ALLERGIES:  is allergic to hydralazine, paroxetine, sulfa antibiotics, venlafaxine, and nalbuphine.  MEDICATIONS:  Current Outpatient Medications  Medication Sig Dispense Refill   albuterol (PROVENTIL HFA;VENTOLIN HFA) 108 (90 Base) MCG/ACT inhaler Inhale 1-2 puffs into the lungs every 6 (six) hours as needed for wheezing or shortness of breath.     amLODipine (NORVASC) 10 MG tablet Take 1 tablet (10 mg total) by mouth daily. 180 tablet 3   atorvastatin (LIPITOR) 20 MG tablet Take 20 mg every evening by mouth.      carvedilol (COREG) 6.25 MG tablet Take 1 tablet (6.25 mg total) by mouth 2 (two) times daily. 180 tablet 3   DULoxetine (CYMBALTA) 30 MG capsule Take 1 capsule (30 mg total) by mouth daily. 90 capsule 0   gabapentin (NEURONTIN) 100 MG capsule TAKE 1 CAPSULE BY MOUTH THREE TIMES A DAY. 90 capsule 0   hydrochlorothiazide (HYDRODIURIL) 25 MG tablet Take 25 mg by mouth daily.     HYDROcodone-acetaminophen (NORCO) 10-325 MG tablet Take 0.5-2 tablets by mouth every 4 (four) hours as needed (1/2 tablet for mild pain, 1 tablet for moderate pain, 2 tablets for severe pain). 80 tablet 0   hydroxypropyl methylcellulose (ISOPTO TEARS) 2.5 % ophthalmic solution Place 1 drop into both eyes 3 (three) times daily as needed. For dry eyes.     levothyroxine (SYNTHROID) 75 MCG tablet TAKE (1) TABLET BY MOUTH EACH MORNING. 90 tablet 0   LINZESS 145 MCG CAPS capsule TAKE 1 CAPSULE ONCE DAILY BEFORE BREAKFAST. 90 capsule 3   Multiple Vitamins-Minerals (EYE VITAMINS PO) Take 1 tablet by mouth daily.      omeprazole  (PRILOSEC) 40 MG capsule TAKE (1) CAPSULE BY MOUTH TWICE DAILY FOR REFLUX. 180 capsule 0   potassium chloride SA (K-DUR) 20 MEQ tablet Take 20 mEq by mouth daily with lunch.      Vitamin D, Ergocalciferol, (DRISDOL) 1.25 MG (50000 UNIT) CAPS capsule Take 50,000 Units by mouth once a week.     ondansetron (ZOFRAN) 4 MG tablet Take 1 tablet (4 mg total) by mouth every 8 (eight) hours  as needed for nausea or vomiting. (Patient not taking: Reported on 03/09/2020) 60 tablet 2   No current facility-administered medications for this visit.    PHYSICAL EXAMINATION: ECOG PERFORMANCE STATUS: 2 - Symptomatic, <50% confined to bed  Vitals:   03/09/20 1405  BP: (!) 167/76  Pulse: (!) 58  Resp: 16  SpO2: 98%   Filed Weights   03/09/20 1405  Weight: 146 lb 3.2 oz (66.3 kg)   GENERAL:alert, no distress and comfortable SKIN: skin color, texture, turgor are normal, no rashes or significant lesions EYES: normal, Conjunctiva are pink and non-injected, sclera clear NECK: supple, thyroid normal size, non-tender, without nodularity LYMPH:  no palpable lymphadenopathy in the cervical, axillary  LUNGS: clear to auscultation and percussion with normal breathing effort HEART: regular rate & rhythm and no murmurs and no lower extremity edema ABDOMEN:abdomen soft, mild tenderness in the left upper abdomen, and normal bowel sounds Musculoskeletal:no cyanosis of digits and no clubbing  NEURO: alert & oriented x 3 with fluent speech, no focal motor/sensory deficits  LABORATORY DATA:  I have reviewed the data as listed CBC Latest Ref Rng & Units 12/01/2019 08/30/2019 07/24/2019  WBC 4.0 - 10.5 K/uL 6.3 6.3 8.0  Hemoglobin 12.0 - 15.0 g/dL 11.5(L) 12.1 11.7(L)  Hematocrit 36.0 - 46.0 % 36.7 36.8 36.6  Platelets 150 - 400 K/uL 212 255 263    CMP Latest Ref Rng & Units 12/01/2019 08/30/2019 07/24/2019  Glucose 70 - 99 mg/dL 116(H) 96 93  BUN 8 - 23 mg/dL 32(H) 27(H) 36(H)  Creatinine 0.44 - 1.00 mg/dL 1.82(H)  1.96(H) 1.70(H)  Sodium 135 - 145 mmol/L 141 142 140  Potassium 3.5 - 5.1 mmol/L 4.2 4.2 4.4  Chloride 98 - 111 mmol/L 110 109 106  CO2 22 - 32 mmol/L _0 Calcium 8.9 - 10.3 mg/dL 8.9 9.1 8.8(L)  Total Protein 6.5 - 8.1 g/dL - - -  Total Bilirubin 0.3 - 1.2 mg/dL - - -  Alkaline Phos 38 - 126 U/L - - -  AST 15 - 41 U/L - - -  ALT 0 - 44 U/L - - -    OUTSIDE LABS:   02/23/20         RADIOGRAPHIC STUDIES: I have personally reviewed the radiological images as listed and agreed with the findings in the report. No results found.  ASSESSMENT & PLAN:  NASIYAH LAVERDIERE is a 84 y.o. Caucasian female with a history of Arthritis, CKD, GERD, COPD, Hepatitis, HLD, HTN, anxiety/depression    1. 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 -She has no anemia, hypercalcemia, new bone pain, or neuropathy, no clinical suspicion for multiple myeloma or light chain disease. -This is likely nonspecific, I doubt that she has MGUS or multiple myeloma. -Plan to repeat SPEP with IFE, light chain level and 24h urine UPEP/IFE/light chain level in 3 and 6 months  -bone survey next month  -f/u in 6 months   2.  Hypertension and CKD, stage III, osteoarthritis -Continue follow-up with PCP and nephrologist Dr. Joelyn Oms   3.  History of cervical cancer and breast surgery for benign breast disease   PLAN:  -outside lab reviewed with pt -will repeat SPEP with IFE, light chain level in 3 and 6 months -collect 24h urine UPEP/IFE/light chain level anytime in next 3 months  -fu with Dr. Delton Coombes in 6 months  Orders Placed This Encounter  Procedures   DG Bone Survey Met    Standing Status:   Future    Standing Expiration Date:   03/09/2021    Order Specific Question:   Reason for Exam (SYMPTOM  OR DIAGNOSIS REQUIRED)    Answer:   elevated light chain, rule out  MM    Order Specific Question:   Preferred imaging location?    Answer:   [facility]    Order Specific Question:   Release to patient    Answer:   Immediate   SPEP with reflex to IFE    Standing Status:   Standing    Number of Occurrences:   3    Standing Expiration Date:   03/09/2021   Kappa/lambda light chains    Standing Status:   Standing    Number of Occurrences:   3    Standing Expiration Date:   03/09/2021   24 Hr Ur UIFE/Light Chains/TP QN    Standing Status:   Future    Standing Expiration Date:   03/09/2021    All questions were answered. The patient knows to call the clinic with any problems, questions or concerns. The total time spent in the appointment was 40 minutes.     Truitt Merle, MD 03/09/2020 2:14 PM  I, Joslyn Devon, am acting as scribe for Truitt Merle, MD.   I have reviewed the above documentation for accuracy and completeness, and I agree with the above.

## 2020-03-09 ENCOUNTER — Encounter (HOSPITAL_COMMUNITY): Payer: Self-pay | Admitting: Hematology

## 2020-03-09 ENCOUNTER — Inpatient Hospital Stay (HOSPITAL_COMMUNITY): Payer: Medicare Other | Attending: Hematology | Admitting: Hematology

## 2020-03-09 ENCOUNTER — Other Ambulatory Visit: Payer: Self-pay

## 2020-03-09 VITALS — BP 167/76 | HR 58 | Resp 16 | Wt 146.2 lb

## 2020-03-09 DIAGNOSIS — R768 Other specified abnormal immunological findings in serum: Secondary | ICD-10-CM

## 2020-03-09 DIAGNOSIS — N183 Chronic kidney disease, stage 3 unspecified: Secondary | ICD-10-CM | POA: Insufficient documentation

## 2020-03-09 DIAGNOSIS — J45909 Unspecified asthma, uncomplicated: Secondary | ICD-10-CM | POA: Insufficient documentation

## 2020-03-09 DIAGNOSIS — R197 Diarrhea, unspecified: Secondary | ICD-10-CM | POA: Insufficient documentation

## 2020-03-09 DIAGNOSIS — Z79899 Other long term (current) drug therapy: Secondary | ICD-10-CM | POA: Insufficient documentation

## 2020-03-09 DIAGNOSIS — K219 Gastro-esophageal reflux disease without esophagitis: Secondary | ICD-10-CM | POA: Insufficient documentation

## 2020-03-09 DIAGNOSIS — F418 Other specified anxiety disorders: Secondary | ICD-10-CM | POA: Diagnosis not present

## 2020-03-09 DIAGNOSIS — I129 Hypertensive chronic kidney disease with stage 1 through stage 4 chronic kidney disease, or unspecified chronic kidney disease: Secondary | ICD-10-CM | POA: Insufficient documentation

## 2020-03-09 DIAGNOSIS — E039 Hypothyroidism, unspecified: Secondary | ICD-10-CM | POA: Diagnosis not present

## 2020-03-09 DIAGNOSIS — E8581 Light chain (AL) amyloidosis: Secondary | ICD-10-CM | POA: Insufficient documentation

## 2020-03-09 DIAGNOSIS — E785 Hyperlipidemia, unspecified: Secondary | ICD-10-CM | POA: Insufficient documentation

## 2020-03-09 DIAGNOSIS — R079 Chest pain, unspecified: Secondary | ICD-10-CM | POA: Insufficient documentation

## 2020-03-09 DIAGNOSIS — Z87891 Personal history of nicotine dependence: Secondary | ICD-10-CM | POA: Insufficient documentation

## 2020-03-09 NOTE — Patient Instructions (Signed)
[place] at [facility] Discharge Instructions  You were seen and examined today by Dr. Burr Medico. Dr. Burr Medico is a medical oncologist and hematologist, meaning he specializes in both cancer diagnoses and blood disorders. Dr. Burr Medico discussed your past medical history, family history of cancer and the events that led to you being here today.  Your nephrologist referred you to the cancer center due to an abnormal protein present in your blood known as light chains. This could be related to a benign disorder known as MGUS or a malignant disorder known as multiple myeloma. Dr. 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   We will repeat labs in 3 months to see if there are any changes in your lab work. We will then see you again in 6 months.   Thank you for choosing [place] at [facility] to provide your oncology and hematology care.  To afford each patient quality time with our provider, please arrive at least 15 minutes before your scheduled appointment time.   If you have a lab appointment with the [facility] please come in thru the Main Entrance and check in at the main information desk.  You need to re-schedule your appointment should you arrive 10 or more minutes late.  We strive to give you quality time with our providers, and arriving late affects you and other patients whose appointments are after yours.  Also, if you no show three or more times for appointments you may be dismissed from the clinic at the providers discretion.     Again, thank you for choosing [facility].  Our hope is that these requests will decrease the amount of time that you wait before being seen by our physicians.       _____________________________________________________________  Should you  have questions after your visit to [facility], please contact our office at [phone] and follow the prompts.  Our office hours are 8:00 a.m. and 4:30 p.m. Monday - Friday.  Please note that voicemails left after 4:00 p.m. may not be returned until the following business day.  We are closed weekends and major holidays.  You do have access to a nurse 24-7, just call the main number to the clinic [phone] and do not press any options, hold on the line and a nurse will answer the phone.    For prescription refill requests, have your pharmacy contact our office and allow 72 hours.    Due to Covid, you will need to wear a mask upon entering the hospital. If you do not have a mask, a mask will be given to you at the Main Entrance upon arrival. For doctor visits, patients may have 1 support person age 82 or older with them. For treatment visits, patients can not have anyone with them due to social distancing guidelines and our immunocompromised population.

## 2020-03-10 ENCOUNTER — Telehealth: Payer: Self-pay | Admitting: Internal Medicine

## 2020-03-10 NOTE — Telephone Encounter (Signed)
Patient states she received her COVID vaccine at [facility] no further information was given.

## 2020-03-15 ENCOUNTER — Encounter: Payer: Self-pay | Admitting: Internal Medicine

## 2020-03-15 ENCOUNTER — Other Ambulatory Visit: Payer: Self-pay

## 2020-03-15 ENCOUNTER — Ambulatory Visit (INDEPENDENT_AMBULATORY_CARE_PROVIDER_SITE_OTHER): Payer: Medicare Other | Admitting: Internal Medicine

## 2020-03-15 VITALS — BP 155/90 | HR 64 | Temp 98.8°F | Resp 18 | Ht 60.0 in | Wt 143.1 lb

## 2020-03-15 DIAGNOSIS — E039 Hypothyroidism, unspecified: Secondary | ICD-10-CM

## 2020-03-15 DIAGNOSIS — N184 Chronic kidney disease, stage 4 (severe): Secondary | ICD-10-CM

## 2020-03-15 DIAGNOSIS — F32A Depression, unspecified: Secondary | ICD-10-CM | POA: Diagnosis not present

## 2020-03-15 DIAGNOSIS — B850 Pediculosis due to Pediculus humanus capitis: Secondary | ICD-10-CM | POA: Diagnosis not present

## 2020-03-15 DIAGNOSIS — I10 Essential (primary) hypertension: Secondary | ICD-10-CM

## 2020-03-15 MED ORDER — SPINOSAD 0.9 % EX SUSP: 1.0000 "application " | Freq: Once | CUTANEOUS | 0 refills | Status: AC

## 2020-03-15 NOTE — Assessment & Plan Note (Addendum)
Continue Levothyroxine to 75 mcg QD Check TSH and T4 in the next visit

## 2020-03-15 NOTE — Assessment & Plan Note (Addendum)
BP Readings from Last 1 Encounters:  03/15/20 (!) 175/81   Stable with amlodipine 10 mg daily, Coreg 6.25 mg twice daily, HCTZ 25 mg daily and lisinopril 5 mg daily Counseled for compliance with the medications Advised DASH diet and moderate exercise/walking as tolerated

## 2020-03-15 NOTE — Assessment & Plan Note (Addendum)
Follows up with nephrologist Recently visited hematooncologist for evaluation of elevated light chain levels, plan to recheck levels in 3 and 6 months Advised to follow renal diet as advised by nephrologist and dietitian Avoid nephrotoxic agents including NSAIDs

## 2020-03-15 NOTE — Patient Instructions (Addendum)
Please apply Spinosad as follows: Apply sufficient amount to cover dry scalp and completely cover dry hair.  Please continue to take medications as prescribed.  Please follow up with your Nephrologist for CKD. Please follow up with dietitian as scheduled.    Lice, Adult     Lice are tiny insects with claws on the ends of their legs. They are small parasites that live on the human body. A parasite is an insect that lives off another animal and cannot survive without it. Lice often make their home in a person's hair, such as hair on the head or in the pubic area. Pubic lice are sometimes referred to as crabs. Lice hatch from little round eggs, which are attached to the base of hairs. Lice eggs are also called nits. Lice can spread from one person to another. Lice crawl. They do not fly or jump. Lice cause skin irritation and itching in the area of the infested hair. Although having lice can be annoying, it is not dangerous. Lice do not spread diseases. Treatment will usually clear up the symptoms within a few days. What are the causes? This condition may be caused by:  Having very close contact with an infested person.  Sharing infested items that touch your skin and hair. These include personal items, such as hats, combs, brushes, towels, clothing, pillowcases, or sheets. Pubic lice are spread through sexual contact. What increases the risk? Although having lice is more common among young children, anyone can get lice. Lice tend to thrive in warm weather, so that type of weather increases the risk. What are the signs or symptoms? Symptoms of this condition include:  Itchiness in the affected area.  Skin irritation.  Feeling of something moving in the hair.  Rash or sores on the skin.  Tiny flakes or sacs near the scalp. These may be white, yellow, or tan.  Tiny bugs crawling on the hair or scalp. How is this diagnosed? This condition is diagnosed based on:  Your symptoms.  A  physical exam. ? Your health care provider will examine the affected area closely for live lice, tiny eggs (nits), and empty egg cases. ? Eggs are typically yellow or tan in color. Empty egg cases are whitish. Lice are gray or brown. How is this treated? Treatment for this condition includes:  Using a hair rinse that contains a mild insecticide to kill lice. Your health care provider will recommend a prescription or over-the-counter rinse.  Removing lice, eggs, and egg cases by using a comb or tweezers.  Washing and bagging your clothing and bedding. Pregnant women should not use medicated shampoo or cream without first talking to their health care provider. Follow these instructions at home: Using medicated rinse Apply medicated rinse as told by your health care provider. Follow the label instructions carefully. General instructions for applying rinses may include these steps: 1. Put on an old shirt or underwear, or use an old towel in case of staining from the rinse. 2. Wash and towel-dry your head or pubic area before applying the rinse if directed to do so. 3. When your hair is dry, apply the rinse. Leave the rinse in your hair for the amount of time specified in the instructions. 4. Rinse the area with water. 5. Comb your wet hair with a fine-tooth comb. Comb it close to the skin and down to the ends, removing any lice, eggs, or egg cases. A lice comb may be included with the medicated rinse. 6. Do not  wash the infested hair for 2 days while the medicine kills the lice. 7. After the treatment, repeat combing out your hair and removing lice, eggs, or egg cases from the hair every 2-3 days. Do this for about 2-3 weeks. After treatment, the remaining lice should be moving more slowly. 8. Repeat the treatment if necessary in 7-10 days. General instructions  Remove any remaining lice, eggs, or egg cases using a fine-tooth comb.  Use hot water to wash all towels, hats, scarves, jackets,  bedding, and clothing that you have recently used.  Put any non-washable items that may have been exposed into plastic bags. Keep the bags closed for 2 weeks.  Soak all combs and brushes in hot water for 10 minutes.  Vacuum furniture to remove any loose hair. There is no need to use chemicals, which can be poisonous (toxic). Lice survive for only 1-2 days away from human skin. Eggs may survive for only 1 week.  For pubic lice, tell any sexual partners to seek treatment.  For head lice, ask your health care provider if other family members or close contacts should be examined or treated as well.  Keep all follow-up visits as told by your health care provider. This is important. Contact a health care provider if:  You develop sores that look infected.  Your rash or sores do not go away in 1 week.  The lice or eggs return or do not go away in spite of treatment. Summary  Lice are tiny parasitic insects that live on the human body. A parasite is an insect that lives off another animal and cannot survive without it.  Lice can spread from one person to another through close contact with an infested person or by sharing personal items, such as combs, brushes, or hats.  Lice can be treated with a medicated rinse. Follow your health care provider's instructions, or instructions on the label, if you are being treated with this medicine.  Ask your health care provider if your family members or close contacts should be treated for lice. This information is not intended to replace advice given to you by your health care provider. Make sure you discuss any questions you have with your health care provider. Document Revised: 08/23/2017 Document Reviewed: 03/22/2016 Elsevier Patient Education  2020 [employer].

## 2020-03-15 NOTE — Assessment & Plan Note (Signed)
Reports noticing lice/lice-like insect exposure Has tried Nix 3 times Advised to clean the linen with specific spray for lice Spinosad prescribed

## 2020-03-15 NOTE — Progress Notes (Signed)
Established Patient Office Visit  Subjective:  Patient ID: [name], female    DOB: 06/30/1935  Age: 84 y.o. MRN: [id number]  CC:  Chief Complaint  Patient presents with  . Follow-up    6 week follow up bp is still running high     HPI [name] is an 84 year old female with past medical history of uncontrolled hypertension, CKD stage IV, fibromyalgia, chronic pelvic pain, chronic constipation, hypothyroidism, osteoarthritis of knees and depressionwho presents for follow-up of her chronic medical conditions.  Her BP was 155/90 in the office today.  She denies any headache, dizziness, chest pain, dyspnea at rest or palpitations.  She states that she has not taken her routine blood pressure medication today.  She usually checks her blood pressure at home and has brought the records with her.  Home BP measurements have been around 110-130s/70s most of the time.  Her husband helps her to keep the record of the blood pressure at home.  She states that her mood is better with Cymbalta now. it is also helping her with her chronic pelvic pain.  Patient was evaluated by nephrologist for CKD stage IV.  She was found to have elevated light chain levels, for which she was referred to hematooncologist.  It appears that her CKD is mainly because of her longstanding uncontrolled hypertension.  She is going to have repeat SPEP in 3 and 6 months.  She denies any dysuria, hematuria or urinary incontinence currently.  She complains of lice/lice like insects in her scalp, which she is not able to pick.  She has tried using latex 3 times already.  She states that she has cleaned her linen with the spray as well.  She denies any patches of hair loss.  Past Medical History:  Diagnosis Date  . Anxiety disorder, unspecified   . Arteriosclerotic cardiovascular disease (ASCVD) 2006   Nonobstructive; < 50% lesions on cath 2002; negative stress nuclear study in 10/2004  . Asthma   . Chest pain,  unspecified   . Chronic kidney disease, unspecified   . Chronic obstructive pulmonary disease, unspecified ([place])   . Chronic pelvic pain in female   . Colitis due to Clostridium difficile [place]  . COPD (chronic obstructive pulmonary disease) ([place])   . Depression with anxiety   . Diarrhea, unspecified   . Disorder of thyroid, unspecified   . Gastro-esophageal reflux disease without esophagitis   . Gastroesophageal reflux disease   . Hyperlipidemia   . Hyperlipidemia, unspecified   . Hypertension   . Hypertensive chronic kidney disease with stage 1 through stage 4 chronic kidney disease, or unspecified chronic kidney disease   . Hyperthyroidism   . Hypothyroidism   . Hypothyroidism, unspecified   . Kidney disease, chronic, stage III (moderate, EGFR 30-59 ml/min) (HCC)   . Lower abdominal pain, unspecified   . Major depressive disorder, single episode, severe without psychotic features ([place])   . Major depressive disorder, single episode, unspecified   . Nausea   . Other postherpetic nervous system involvement   . Pelvic and perineal pain   . Rash and other nonspecific skin eruption   . S/P colonoscopy 2007   few diverticula, otherwise nl  . S/P colonoscopy 12/13/10   rectal, cecal polyp, left-sided diverticulosis, hyperplastic. ?anal fissure? treated empirically  . S/P endoscopy 2009   linear esophageal erosions  . S/P endoscopy 05/10/10   Question island of salmon-colored epithelium in distal esophagus ; no Barrett's.   Marland Kitchen  Shingles   . Tobacco abuse, in remission    55-pack-year consumption; discontinued 08/2010  . Unspecified asthma, uncomplicated   . Unspecified osteoarthritis, unspecified site   . Urinary tract infection, site not specified     Past Surgical History:  Procedure Laterality Date  . ABDOMINAL HYSTERECTOMY    . BREAST LUMPECTOMY    . CATARACT EXTRACTION    . CHOLECYSTECTOMY  1962  . COLONOSCOPY  12/13/2010   Left-sided diverticulosis.  Cecal polyp,  status post hot snare polypectomy/ Diminutive rectal polyp, status post cold biopsy removal tender/painful anal canal, ? occult anal fissure- Not visualized  . COLONOSCOPY N/A 10/14/2015   Diverticulosis  . ESOPHAGOGASTRODUODENOSCOPY  05/10/10   benign mucosa with mild chronic inflammation.  . ESOPHAGOGASTRODUODENOSCOPY (EGD) WITH PROPOFOL N/A 02/13/2019   Procedure: ESOPHAGOGASTRODUODENOSCOPY (EGD) WITH PROPOFOL;  Surgeon: Daneil Dolin, MD;  Location: [facility];  Service: Endoscopy;  Laterality: N/A;  3:00pm  . FLEXIBLE SIGMOIDOSCOPY  12/29/2011   Procedure: FLEXIBLE SIGMOIDOSCOPY;  Surgeon: Rogene Houston, MD;  Location: [facility];  Service: Endoscopy;  Laterality: N/A;  200-Ann notified pt to be here @ 1:00  . NISSEN FUNDOPLICATION    . YAG LASER APPLICATION Left 2/87/8676   Procedure: YAG LASER APPLICATION;  Surgeon: Williams Che, MD;  Location: [facility];  Service: Ophthalmology;  Laterality: Left;    Family History  Problem Relation Age of Onset  . Diabetes Mother   . Alzheimer's disease Mother   . Cancer Mother   . Heart disease Father   . Cancer Sister   . Heart disease Brother        Also mother, Father, brother and 2 sons  . Cancer Son        testicular cancer   . Aneurysm Son   . Diabetes Son   . Hypertension Brother        also Sister x2  . Colon cancer Neg Hx     Social History   Socioeconomic History  . Marital status: Married    Spouse name: Not on file  . Number of children: 4  . Years of education: Not on file  . Highest education level: Not on file  Occupational History  . Not on file  Tobacco Use  . Smoking status: Former Smoker    Packs/day: 1.00    Years: 55.00    Pack years: 55.00    Quit date: 09/15/2010    Years since quitting: 9.5  . Smokeless tobacco: Never Used  Vaping Use  . Vaping Use: Never used  Substance and Sexual Activity  . Alcohol use: No    Alcohol/week: 0.0 standard drinks  . Drug use: No  . Sexual activity: Not  Currently  Other Topics Concern  . Not on file  Social History Narrative  . Not on file   Social Determinants of Health   Financial Resource Strain: Low Risk   . Difficulty of Paying Living Expenses: Not hard at all  Food Insecurity: No Food Insecurity  . Worried About Charity fundraiser in the Last Year: Never true  . Ran Out of Food in the Last Year: Never true  Transportation Needs: No Transportation Needs  . Lack of Transportation (Medical): No  . Lack of Transportation (Non-Medical): No  Physical Activity: Insufficiently Active  . Days of Exercise per Week: 7 days  . Minutes of Exercise per Session: 20 min  Stress: Stress Concern Present  . Feeling of Stress : Rather much  Social Connections: Moderately Integrated  . Frequency of Communication with Friends and Family: More than three times a week  . Frequency of Social Gatherings with Friends and Family: More than three times a week  . Attends Religious Services: More than 4 times per year  . Active Member of Clubs or Organizations: No  . Attends Archivist Meetings: Never  . Marital Status: Married  Human resources officer Violence: Not At Risk  . Fear of Current or Ex-Partner: No  . Emotionally Abused: No  . Physically Abused: No  . Sexually Abused: No    Outpatient Medications Prior to Visit  Medication Sig Dispense Refill  . albuterol (PROVENTIL HFA;VENTOLIN HFA) 108 (90 Base) MCG/ACT inhaler Inhale 1-2 puffs into the lungs every 6 (six) hours as needed for wheezing or shortness of breath.    Marland Kitchen amLODipine (NORVASC) 10 MG tablet Take 1 tablet (10 mg total) by mouth daily. 180 tablet 3  . atorvastatin (LIPITOR) 20 MG tablet Take 20 mg every evening by mouth.     . carvedilol (COREG) 6.25 MG tablet Take 1 tablet (6.25 mg total) by mouth 2 (two) times daily. 180 tablet 3  . DULoxetine (CYMBALTA) 30 MG capsule Take 1 capsule (30 mg total) by mouth daily. 90 capsule 0  . gabapentin (NEURONTIN) 100 MG capsule TAKE 1  CAPSULE BY MOUTH THREE TIMES A DAY. 90 capsule 0  . hydrochlorothiazide (HYDRODIURIL) 25 MG tablet Take 25 mg by mouth daily.    Marland Kitchen HYDROcodone-acetaminophen (NORCO) 10-325 MG tablet Take 0.5-2 tablets by mouth every 4 (four) hours as needed (1/2 tablet for mild pain, 1 tablet for moderate pain, 2 tablets for severe pain). 80 tablet 0  . hydroxypropyl methylcellulose (ISOPTO TEARS) 2.5 % ophthalmic solution Place 1 drop into both eyes 3 (three) times daily as needed. For dry eyes.    Marland Kitchen levothyroxine (SYNTHROID) 75 MCG tablet TAKE (1) TABLET BY MOUTH EACH MORNING. 90 tablet 0  . LINZESS 145 MCG CAPS capsule TAKE 1 CAPSULE ONCE DAILY BEFORE BREAKFAST. 90 capsule 3  . lisinopril (ZESTRIL) 5 MG tablet Take 5 mg by mouth daily.    . Multiple Vitamins-Minerals (EYE VITAMINS PO) Take 1 tablet by mouth daily.     Marland Kitchen omeprazole (PRILOSEC) 40 MG capsule TAKE (1) CAPSULE BY MOUTH TWICE DAILY FOR REFLUX. 180 capsule 0  . ondansetron (ZOFRAN) 4 MG tablet Take 1 tablet (4 mg total) by mouth every 8 (eight) hours as needed for nausea or vomiting. 60 tablet 2  . potassium chloride SA (K-DUR) 20 MEQ tablet Take 20 mEq by mouth daily with lunch.      No facility-administered medications prior to visit.    Allergies  Allergen Reactions  . Hydralazine     Breathing  . Paroxetine Itching  . Sulfa Antibiotics Other (See Comments)    Childhood reaction.  . Venlafaxine Itching  . Nalbuphine Rash    ROS Review of Systems  Constitutional: Positive for appetite change (Mild decrease) and fatigue. Negative for chills and fever.  HENT: Negative for congestion, sinus pressure, sinus pain and sore throat.   Eyes: Negative for pain and discharge.  Respiratory: Negative for cough and shortness of breath.   Cardiovascular: Negative for chest pain and palpitations.  Gastrointestinal: Negative for abdominal pain, constipation, diarrhea, nausea and vomiting.  Endocrine: Negative for polydipsia and polyuria.   Genitourinary: Negative for dysuria and hematuria.  Musculoskeletal: Positive for myalgias. Negative for neck pain and neck stiffness.  Skin: Negative for rash.  Neurological: Negative for dizziness and weakness.  Psychiatric/Behavioral: Negative for agitation and behavioral problems.      Objective:    Physical Exam Vitals reviewed.  Constitutional:      General: She is not in acute distress.    Appearance: She is not diaphoretic.  HENT:     Head: Normocephalic and atraumatic.     Nose: Nose normal.     Mouth/Throat:     Mouth: Mucous membranes are moist.  Eyes:     General: No scleral icterus.    Extraocular Movements: Extraocular movements intact.     Pupils: Pupils are equal, round, and reactive to light.  Cardiovascular:     Rate and Rhythm: Normal rate and regular rhythm.     Pulses: Normal pulses.     Heart sounds: Normal heart sounds. No murmur heard.   Pulmonary:     Breath sounds: Normal breath sounds. No wheezing or rales.  Abdominal:     Palpations: Abdomen is soft.     Tenderness: There is no abdominal tenderness.  Musculoskeletal:     Cervical back: Neck supple. No tenderness.     Right lower leg: No edema.     Left lower leg: No edema.  Skin:    General: Skin is warm.     Findings: No rash.  Neurological:     General: No focal deficit present.     Mental Status: She is alert and oriented to person, place, and time.     Sensory: No sensory deficit.     Motor: No weakness.  Psychiatric:        Mood and Affect: Mood normal.        Behavior: Behavior normal.     BP (!) 155/90 (BP Location: Left Arm, Patient Position: Sitting)   Pulse 64   Temp 98.8 F (37.1 C) (Oral)   Resp 18   Ht 5' (1.524 m)   Wt 143 lb 1.9 oz (64.9 kg)   SpO2 97%   BMI 27.95 kg/m  Wt Readings from Last 3 Encounters:  03/15/20 143 lb 1.9 oz (64.9 kg)  03/09/20 146 lb 3.2 oz (66.3 kg)  02/02/20 145 lb (65.8 kg)     Health Maintenance Due  Topic Date Due  .  COVID-19 Vaccine (1) Never done  . TETANUS/TDAP  Never done    There are no preventive care reminders to display for this patient.  Lab Results  Component Value Date   TSH 0.406 (L) 12/22/2019   Lab Results  Component Value Date   WBC 6.3 12/01/2019   HGB 11.5 (L) 12/01/2019   HCT 36.7 12/01/2019   MCV 93.9 12/01/2019   PLT 212 12/01/2019   Lab Results  Component Value Date   NA 141 12/01/2019   K 4.2 12/01/2019   CO2 23 12/01/2019   GLUCOSE 116 (H) 12/01/2019   BUN 32 (H) 12/01/2019   CREATININE 1.82 (H) 12/01/2019   BILITOT 0.4 11/26/2018   ALKPHOS 50 11/26/2018   AST 12 (L) 11/26/2018   ALT 12 11/26/2018   PROT 6.6 11/26/2018   ALBUMIN 3.5 11/26/2018   CALCIUM 8.9 12/01/2019   ANIONGAP 8 12/01/2019   Lab Results  Component Value Date   CHOL 164 09/17/2018   Lab Results  Component Value Date   HDL 44 09/17/2018   Lab Results  Component Value Date   LDLCALC 95 09/17/2018   Lab Results  Component Value Date   TRIG 145 09/17/2018   Lab Results  Component Value Date   CHOLHDL 2.9 07/15/2008   Lab Results  Component Value Date   HGBA1C 5.6 02/02/2020   HGBA1C 5.6 02/02/2020   HGBA1C 5.6 (A) 02/02/2020   HGBA1C 5.6 02/02/2020      Assessment & Plan:   Problem List Items Addressed This Visit      Cardiovascular and Mediastinum   Malignant hypertension    BP Readings from Last 1 Encounters:  03/15/20 (!) 175/81   Stable with amlodipine 10 mg daily, Coreg 6.25 mg twice daily, HCTZ 25 mg daily and lisinopril 5 mg daily Counseled for compliance with the medications Advised DASH diet and moderate exercise/walking as tolerated      Relevant Medications   lisinopril (ZESTRIL) 5 MG tablet     Endocrine   Hypothyroidism    Continue Levothyroxine to 75 mcg QD Check TSH and T4 in the next visit        Musculoskeletal and Integument   Pediculus capitis - Primary    Reports noticing lice/lice-like insect exposure Has tried Nix 3 times Advised  to clean the linen with specific spray for lice Spinosad prescribed      Relevant Medications   Spinosad 0.9 % SUSP     Genitourinary   CKD (chronic kidney disease) stage 4, GFR 15-29 ml/min (HCC)    Follows up with nephrologist Recently visited hematooncologist for evaluation of elevated light chain levels, plan to recheck levels in 3 and 6 months Advised to follow renal diet as advised by nephrologist and dietitian Avoid nephrotoxic agents including NSAIDs        Other   Chronic depression    Well-controlled with Cymbalta now         Meds ordered this encounter  Medications  . Spinosad 0.9 % SUSP    Sig: Apply 1 application topically once for 1 dose.    Dispense:  120 mL    Refill:  0    Follow-up: Return in about 3 months (around 06/13/2020), or if symptoms worsen or fail to improve.    Lindell Spar, MD

## 2020-03-15 NOTE — Assessment & Plan Note (Signed)
Well controlled with Cymbalta now 

## 2020-03-16 ENCOUNTER — Telehealth: Payer: Self-pay | Admitting: *Deleted

## 2020-03-16 ENCOUNTER — Other Ambulatory Visit: Payer: Self-pay | Admitting: Internal Medicine

## 2020-03-16 DIAGNOSIS — B850 Pediculosis due to Pediculus humanus capitis: Secondary | ICD-10-CM

## 2020-03-16 MED ORDER — PERMETHRIN 0.25 % LIQD: 1.0000 "application " | Freq: Once | 0 refills | Status: AC

## 2020-03-16 NOTE — Progress Notes (Signed)
Sent Permethrin prescription as Spinosad is expensive.

## 2020-03-16 NOTE — Telephone Encounter (Signed)
Sent Permethrin. Thanks.

## 2020-03-16 NOTE — Telephone Encounter (Signed)
[name] called the medication called in for lice is too expensive for the patient and wanted to know if promethasine could be called in its cheaper for the patient please advise

## 2020-03-17 ENCOUNTER — Other Ambulatory Visit: Payer: Self-pay | Admitting: Internal Medicine

## 2020-03-17 DIAGNOSIS — B850 Pediculosis due to Pediculus humanus capitis: Secondary | ICD-10-CM

## 2020-03-17 MED ORDER — PERMETHRIN 5 % EX CREA: 1.0000 "application " | TOPICAL_CREAM | Freq: Once | CUTANEOUS | 0 refills | Status: AC

## 2020-03-17 NOTE — Telephone Encounter (Signed)
[name] said pt didn't want that prescription she had already been taking this pharmacist asked if you could send in the 5% prescription strength

## 2020-04-07 ENCOUNTER — Other Ambulatory Visit: Payer: Self-pay

## 2020-04-07 ENCOUNTER — Ambulatory Visit (HOSPITAL_COMMUNITY)
Admission: RE | Admit: 2020-04-07 | Discharge: 2020-04-07 | Disposition: A | Discharge status: Home / Self Care | Payer: Medicare Other | Source: Ambulatory Visit | Attending: Hematology | Admitting: Hematology

## 2020-04-07 DIAGNOSIS — R768 Other specified abnormal immunological findings in serum: Secondary | ICD-10-CM

## 2020-04-07 DIAGNOSIS — G894 Chronic pain syndrome: Secondary | ICD-10-CM | POA: Diagnosis not present

## 2020-04-07 DIAGNOSIS — M810 Age-related osteoporosis without current pathological fracture: Secondary | ICD-10-CM | POA: Diagnosis not present

## 2020-04-07 DIAGNOSIS — M797 Fibromyalgia: Secondary | ICD-10-CM | POA: Diagnosis not present

## 2020-04-07 DIAGNOSIS — M130 Polyarthritis, unspecified: Secondary | ICD-10-CM | POA: Diagnosis not present

## 2020-04-07 DIAGNOSIS — D472 Monoclonal gammopathy: Secondary | ICD-10-CM | POA: Diagnosis not present

## 2020-04-07 DIAGNOSIS — Z79891 Long term (current) use of opiate analgesic: Secondary | ICD-10-CM | POA: Diagnosis not present

## 2020-04-09 ENCOUNTER — Other Ambulatory Visit (HOSPITAL_COMMUNITY): Payer: Self-pay

## 2020-04-09 DIAGNOSIS — R768 Other specified abnormal immunological findings in serum: Secondary | ICD-10-CM

## 2020-04-12 ENCOUNTER — Telehealth: Payer: Self-pay | Admitting: *Deleted

## 2020-04-12 NOTE — Telephone Encounter (Signed)
Notified of message below

## 2020-04-12 NOTE — Telephone Encounter (Signed)
-----   Message from Truitt Merle, MD sent at 04/11/2020 12:29 PM EST ----- Please let pt know her bone survey results, no concerns for MM, thanks   Truitt Merle  04/11/2020

## 2020-04-12 NOTE — Telephone Encounter (Signed)
LM to call Dr Feng's nurse 

## 2020-04-14 LAB — UIFE/LIGHT CHAINS/TP QN, 24-HR UR
FR KAPPA LT CH,24HR: 93.97 mg/24 hr
FR LAMBDA LT CH,24HR: 13.92 mg/24 hr
Free Kappa Lt Chains,Ur: 78.31 mg/L (ref 0.63–113.79)
Free Kappa/Lambda Ratio: 6.75 (ref 1.03–31.76)
Free Lambda Lt Chains,Ur: 11.6 mg/L (ref 0.47–11.77)
Total Protein, Urine-Ur/day: 929 mg/24 hr — ABNORMAL HIGH (ref 30–150)
Total Protein, Urine: 77.4 mg/dL
Total Volume: 1200

## 2020-04-16 ENCOUNTER — Ambulatory Visit: Payer: Medicare Other | Admitting: Student

## 2020-04-21 ENCOUNTER — Telehealth: Payer: Self-pay

## 2020-04-21 NOTE — Telephone Encounter (Signed)
I spoke with [name] and relayed Dr Lewayne Bunting comments and recommendations.  She verbalized understanding

## 2020-04-21 NOTE — Telephone Encounter (Signed)
-----  Message from Truitt Merle, MD sent at 04/20/2020 12:18 PM EST ----- Please let pt know her urine test showed positive protein but non-specific, likely related to her CKD, no concern for blood disorder (MGUS or multiple myeloma), thanks   Truitt Merle  04/20/2020

## 2020-04-26 DIAGNOSIS — R809 Proteinuria, unspecified: Secondary | ICD-10-CM | POA: Diagnosis not present

## 2020-04-26 DIAGNOSIS — I129 Hypertensive chronic kidney disease with stage 1 through stage 4 chronic kidney disease, or unspecified chronic kidney disease: Secondary | ICD-10-CM | POA: Diagnosis not present

## 2020-04-26 DIAGNOSIS — N1832 Chronic kidney disease, stage 3b: Secondary | ICD-10-CM | POA: Diagnosis not present

## 2020-05-12 ENCOUNTER — Other Ambulatory Visit: Payer: Self-pay | Admitting: Internal Medicine

## 2020-05-12 DIAGNOSIS — N1832 Chronic kidney disease, stage 3b: Secondary | ICD-10-CM | POA: Diagnosis not present

## 2020-05-14 ENCOUNTER — Other Ambulatory Visit: Payer: Self-pay

## 2020-05-14 ENCOUNTER — Ambulatory Visit: Payer: Medicare Other

## 2020-05-15 ENCOUNTER — Other Ambulatory Visit: Payer: Self-pay | Admitting: Internal Medicine

## 2020-05-19 NOTE — Progress Notes (Deleted)
Cardiology Office Note    Date:  05/19/2020   ID:  Terrion, Poblano [birth year], MRN [id number]  PCP:  Lindell Spar, MD  Cardiologist: Dorris Carnes, MD    No chief complaint on file.   History of Present Illness:    [name] is a 85 y.o. female with past medical history of HTN, HLD, palpitations (PAC's, PVC's and SVT by prior monitor), nonobstructive CAD by cardiac catheterization in 2002,Stage 3-4 CKD and COPD who presents to the office today for 31-monthfollow-up.    She was last examined by myself in 11/2019 and reported being under increased stress as her dog had recently passed away and she felt this was contributing to her elevated BP readings. BP was at 150/90 during her visit and Amlodipine was titrated from 557mdaily to 1029maily. Was not started on an ACE-I or ARB given her variable renal function.   - Nephrology  Past Medical History:  Diagnosis Date  . Anxiety disorder, unspecified   . Arteriosclerotic cardiovascular disease (ASCVD) 2006   Nonobstructive; < 50% lesions on cath 2002; negative stress nuclear study in 10/2004  . Asthma   . Chest pain, unspecified   . Chronic kidney disease, unspecified   . Chronic obstructive pulmonary disease, unspecified ([facility] . Chronic pelvic pain in female   . Colitis due to Clostridium difficile [place] COPD (chronic obstructive pulmonary disease) ([facility] . Depression with anxiety   . Diarrhea, unspecified   . Disorder of thyroid, unspecified   . Gastro-esophageal reflux disease without esophagitis   . Gastroesophageal reflux disease   . Hyperlipidemia   . Hyperlipidemia, unspecified   . Hypertension   . Hypertensive chronic kidney disease with stage 1 through stage 4 chronic kidney disease, or unspecified chronic kidney disease   . Hyperthyroidism   . Hypothyroidism   . Hypothyroidism, unspecified   . Kidney disease, chronic, stage III (moderate, EGFR 30-59 ml/min) (HCC)   . Lower abdominal pain,  unspecified   . Major depressive disorder, single episode, severe without psychotic features ([facility] . Major depressive disorder, single episode, unspecified   . Nausea   . Other postherpetic nervous system involvement   . Pelvic and perineal pain   . Rash and other nonspecific skin eruption   . S/P colonoscopy 2007   few diverticula, otherwise nl  . S/P colonoscopy 12/13/10   rectal, cecal polyp, left-sided diverticulosis, hyperplastic. ?anal fissure? treated empirically  . S/P endoscopy 2009   linear esophageal erosions  . S/P endoscopy 05/10/10   Question island of salmon-colored epithelium in distal esophagus ; no Barrett's.   . Shingles   . Tobacco abuse, in remission    55-pack-year consumption; discontinued 08/2010  . Unspecified asthma, uncomplicated   . Unspecified osteoarthritis, unspecified site   . Urinary tract infection, site not specified     Past Surgical History:  Procedure Laterality Date  . ABDOMINAL HYSTERECTOMY    . BREAST LUMPECTOMY    . CATARACT EXTRACTION    . CHOLECYSTECTOMY  1962  . COLONOSCOPY  12/13/2010   Left-sided diverticulosis.  Cecal polyp, status post hot snare polypectomy/ Diminutive rectal polyp, status post cold biopsy removal tender/painful anal canal, ? occult anal fissure- Not visualized  . COLONOSCOPY N/A 10/14/2015   Diverticulosis  . ESOPHAGOGASTRODUODENOSCOPY  05/10/10   benign mucosa with mild chronic inflammation.  . ESOPHAGOGASTRODUODENOSCOPY (EGD) WITH PROPOFOL N/A 02/13/2019   Procedure: ESOPHAGOGASTRODUODENOSCOPY (EGD) WITH PROPOFOL;  Surgeon: Daneil Dolin, MD;  [facility];  Service: Endoscopy;  Laterality: N/A;  3:00pm  . FLEXIBLE SIGMOIDOSCOPY  12/29/2011   Procedure: FLEXIBLE SIGMOIDOSCOPY;  Surgeon: Rogene Houston, MD;  Location: [facility];  Service: Endoscopy;  Laterality: N/A;  200-Ann notified pt to be here @ 1:00  . NISSEN FUNDOPLICATION    . YAG LASER APPLICATION Left 12/10/567   Procedure: YAG LASER  APPLICATION;  Surgeon: Williams Che, MD;  Location: [facility];  Service: Ophthalmology;  Laterality: Left;    Current Medications: Outpatient Medications Prior to Visit  Medication Sig Dispense Refill  . albuterol (PROVENTIL HFA;VENTOLIN HFA) 108 (90 Base) MCG/ACT inhaler Inhale 1-2 puffs into the lungs every 6 (six) hours as needed for wheezing or shortness of breath.    Marland Kitchen amLODipine (NORVASC) 10 MG tablet Take 1 tablet (10 mg total) by mouth daily. 180 tablet 3  . atorvastatin (LIPITOR) 20 MG tablet Take 20 mg every evening by mouth.     . carvedilol (COREG) 6.25 MG tablet Take 1 tablet (6.25 mg total) by mouth 2 (two) times daily. 180 tablet 3  . DULoxetine (CYMBALTA) 30 MG capsule Take 1 capsule (30 mg total) by mouth daily. 90 capsule 0  . gabapentin (NEURONTIN) 100 MG capsule TAKE 1 CAPSULE BY MOUTH THREE TIMES A DAY. 90 capsule 0  . hydrochlorothiazide (HYDRODIURIL) 25 MG tablet Take 25 mg by mouth daily.    Marland Kitchen HYDROcodone-acetaminophen (NORCO) 10-325 MG tablet Take 0.5-2 tablets by mouth every 4 (four) hours as needed (1/2 tablet for mild pain, 1 tablet for moderate pain, 2 tablets for severe pain). 80 tablet 0  . hydroxypropyl methylcellulose (ISOPTO TEARS) 2.5 % ophthalmic solution Place 1 drop into both eyes 3 (three) times daily as needed. For dry eyes.    Marland Kitchen levothyroxine (SYNTHROID) 75 MCG tablet TAKE (1) TABLET BY MOUTH EACH MORNING. 90 tablet 0  . LINZESS 145 MCG CAPS capsule TAKE 1 CAPSULE ONCE DAILY BEFORE BREAKFAST. 90 capsule 3  . lisinopril (ZESTRIL) 5 MG tablet Take 5 mg by mouth daily.    . Multiple Vitamins-Minerals (EYE VITAMINS PO) Take 1 tablet by mouth daily.     Marland Kitchen omeprazole (PRILOSEC) 40 MG capsule TAKE (1) CAPSULE BY MOUTH TWICE DAILY FOR REFLUX. 180 capsule 0  . ondansetron (ZOFRAN) 4 MG tablet TAKE 1 TABLET BY MOUTH THREE TIMES AS NEEDED FOR NAUSEA. 90 tablet 0  . potassium chloride SA (K-DUR) 20 MEQ tablet Take 20 mEq by mouth daily with lunch.      No  facility-administered medications prior to visit.     Allergies:   Hydralazine, Paroxetine, Sulfa antibiotics, Venlafaxine, and Nalbuphine   Social History   Socioeconomic History  . Marital status: Married    Spouse name: Not on file  . Number of children: 4  . Years of education: Not on file  . Highest education level: Not on file  Occupational History  . Not on file  Tobacco Use  . Smoking status: Former Smoker    Packs/day: 1.00    Years: 55.00    Pack years: 55.00    Quit date: 09/15/2010    Years since quitting: 9.6  . Smokeless tobacco: Never Used  Vaping Use  . Vaping Use: Never used  Substance and Sexual Activity  . Alcohol use: No    Alcohol/week: 0.0 standard drinks  . Drug use: No  . Sexual activity: Not Currently  Other Topics Concern  . Not on file  Social History Narrative  . Not on file   Social Determinants of Health   Financial Resource Strain: Low Risk   . Difficulty of Paying Living Expenses: Not hard at all  Food Insecurity: No Food Insecurity  . Worried About Charity fundraiser in the Last Year: Never true  . Ran Out of Food in the Last Year: Never true  Transportation Needs: No Transportation Needs  . Lack of Transportation (Medical): No  . Lack of Transportation (Non-Medical): No  Physical Activity: Insufficiently Active  . Days of Exercise per Week: 7 days  . Minutes of Exercise per Session: 20 min  Stress: Stress Concern Present  . Feeling of Stress : Rather much  Social Connections: Moderately Integrated  . Frequency of Communication with Friends and Family: More than three times a week  . Frequency of Social Gatherings with Friends and Family: More than three times a week  . Attends Religious Services: More than 4 times per year  . Active Member of Clubs or Organizations: No  . Attends Archivist Meetings: Never  . Marital Status: Married     Family History:  The patient's ***family history includes Alzheimer's disease  in her mother; Aneurysm in her son; Cancer in her mother, sister, and son; Diabetes in her mother and son; Heart disease in her brother and father; Hypertension in her brother.   Review of Systems:   Please see the history of present illness.     General:  No chills, fever, night sweats or weight changes.  Cardiovascular:  No chest pain, dyspnea on exertion, edema, orthopnea, palpitations, paroxysmal nocturnal dyspnea. Dermatological: No rash, lesions/masses Respiratory: No cough, dyspnea Urologic: No hematuria, dysuria Abdominal:   No nausea, vomiting, diarrhea, bright red blood per rectum, melena, or hematemesis Neurologic:  No visual changes, wkns, changes in mental status. All other systems reviewed and are otherwise negative except as noted above.   Physical Exam:    VS:  There were no vitals taken for this visit.   General: Well developed, well nourished,female appearing in no acute distress. Head: Normocephalic, atraumatic. Neck: No carotid bruits. JVD not elevated.  Lungs: Respirations regular and unlabored, without wheezes or rales.  Heart: ***Regular rate and rhythm. No S3 or S4.  No murmur, no rubs, or gallops appreciated. Abdomen: Appears non-distended. No obvious abdominal masses. Msk:  Strength and tone appear normal for age. No obvious joint deformities or effusions. Extremities: No clubbing or cyanosis. No edema.  Distal pedal pulses are 2+ bilaterally. Neuro: Alert and oriented X 3. Moves all extremities spontaneously. No focal deficits noted. Psych:  Responds to questions appropriately with a normal affect. Skin: No rashes or lesions noted  Wt Readings from Last 3 Encounters:  03/15/20 143 lb 1.9 oz (64.9 kg)  03/09/20 146 lb 3.2 oz (66.3 kg)  02/02/20 145 lb (65.8 kg)        Studies/Labs Reviewed:   EKG:  EKG is*** ordered today.  The ekg ordered today demonstrates ***  Recent Labs: 07/24/2019: Magnesium 2.1 12/01/2019: BUN 32; Creatinine, Ser 1.82;  Hemoglobin 11.5; Platelets 212; Potassium 4.2; Sodium 141 12/22/2019: TSH 0.406   Lipid Panel    Component Value Date/Time   CHOL 164 09/17/2018 0000   TRIG 145 09/17/2018 0000   HDL 44 09/17/2018 0000   CHOLHDL 2.9 07/15/2008 0528   VLDL 23 07/15/2008 0528   LDLCALC 95 09/17/2018 0000    Additional studies/ records that were reviewed today include:   Echocardiogram: 09/2015 Study  Conclusions   - Left ventricle: The cavity size was normal. Wall thickness was  normal. Systolic function was normal. The estimated ejection  fraction was in the range of 55% to 60%. Wall motion was normal;  there were no regional wall motion abnormalities. Doppler  parameters are consistent with abnormal left ventricular  relaxation (grade 1 diastolic dysfunction).  - Aortic valve: Trileaflet; mildly calcified leaflets.  - Mitral valve: Mildly thickened leaflets . There was mild  regurgitation.  - Left atrium: The atrium was at the upper limits of normal in  size.  - Right atrium: Central venous pressure (est): 3 mm Hg.  - Tricuspid valve: There was trivial regurgitation.  - Pulmonary arteries: PA peak pressure: 34 mm Hg (S).  - Pericardium, extracardiac: There was no pericardial effusion.   Impressions:   - Normal LV wall thickness with LVEF 55-60%. Grade 1 diastolic  dysfunction with intermediate LV filling pressure. Upper normal  left atrial chamber size. Mild mitral regurgitation. Trivial  tricuspid regurgitation with PASP 34 mmHg.   Event Monitor: 08/2019 Sinus rhythm  With occasional PACs, short burst SVT(5 beats)   42 to 171 bpm   Average HR 60 NO significant pauses    Assessment:    No diagnosis found.   Plan:   In order of problems listed above:  1. ***    Shared Decision Making/Informed Consent:   {Are you ordering a CV Procedure (e.g. stress test, cath, DCCV, TEE, etc)?   Press F2        :[id number]}    Medication Adjustments/Labs and Tests  Ordered: Current medicines are reviewed at length with the patient today.  Concerns regarding medicines are outlined above.  Medication changes, Labs and Tests ordered today are listed in the Patient Instructions below. There are no Patient Instructions on file for this visit.   Signed, Erma Heritage, PA-C  05/19/2020 11:54 AM    [facility] [address] [place], [place] [address] Phone: [phone] Fax: [phone]

## 2020-05-20 ENCOUNTER — Ambulatory Visit: Payer: Medicare Other | Admitting: Student

## 2020-06-02 DIAGNOSIS — M797 Fibromyalgia: Secondary | ICD-10-CM | POA: Diagnosis not present

## 2020-06-02 DIAGNOSIS — M130 Polyarthritis, unspecified: Secondary | ICD-10-CM | POA: Diagnosis not present

## 2020-06-02 DIAGNOSIS — Z79891 Long term (current) use of opiate analgesic: Secondary | ICD-10-CM | POA: Diagnosis not present

## 2020-06-02 DIAGNOSIS — G894 Chronic pain syndrome: Secondary | ICD-10-CM | POA: Diagnosis not present

## 2020-06-08 ENCOUNTER — Inpatient Hospital Stay (HOSPITAL_COMMUNITY): Payer: Medicare Other | Attending: Hematology

## 2020-06-08 ENCOUNTER — Other Ambulatory Visit: Payer: Self-pay

## 2020-06-08 DIAGNOSIS — R768 Other specified abnormal immunological findings in serum: Secondary | ICD-10-CM

## 2020-06-08 DIAGNOSIS — E8589 Other amyloidosis: Secondary | ICD-10-CM | POA: Insufficient documentation

## 2020-06-09 LAB — KAPPA/LAMBDA LIGHT CHAINS
Kappa free light chain: 197 mg/L — ABNORMAL HIGH (ref 3.3–19.4)
Kappa, lambda light chain ratio: 0.81 (ref 0.26–1.65)
Lambda free light chains: 243.2 mg/L — ABNORMAL HIGH (ref 5.7–26.3)

## 2020-06-10 LAB — PROTEIN ELECTROPHORESIS, SERUM, WITH REFLEX
A/G Ratio: 1.3 (ref 0.7–1.7)
Albumin ELP: 3.5 g/dL (ref 2.9–4.4)
Alpha-1-Globulin: 0.2 g/dL (ref 0.0–0.4)
Alpha-2-Globulin: 0.9 g/dL (ref 0.4–1.0)
Beta Globulin: 0.9 g/dL (ref 0.7–1.3)
Gamma Globulin: 0.8 g/dL (ref 0.4–1.8)
Globulin, Total: 2.8 g/dL (ref 2.2–3.9)
Total Protein ELP: 6.3 g/dL (ref 6.0–8.5)

## 2020-06-14 ENCOUNTER — Encounter: Payer: Self-pay | Admitting: Internal Medicine

## 2020-06-14 ENCOUNTER — Ambulatory Visit (INDEPENDENT_AMBULATORY_CARE_PROVIDER_SITE_OTHER): Payer: Medicare Other | Admitting: Internal Medicine

## 2020-06-14 ENCOUNTER — Other Ambulatory Visit: Payer: Self-pay

## 2020-06-14 VITALS — BP 161/82 | HR 66 | Temp 98.0°F | Resp 16 | Ht 60.0 in | Wt 143.1 lb

## 2020-06-14 DIAGNOSIS — G894 Chronic pain syndrome: Secondary | ICD-10-CM | POA: Diagnosis not present

## 2020-06-14 DIAGNOSIS — R7303 Prediabetes: Secondary | ICD-10-CM

## 2020-06-14 DIAGNOSIS — K219 Gastro-esophageal reflux disease without esophagitis: Secondary | ICD-10-CM

## 2020-06-14 DIAGNOSIS — N184 Chronic kidney disease, stage 4 (severe): Secondary | ICD-10-CM

## 2020-06-14 DIAGNOSIS — I10 Essential (primary) hypertension: Secondary | ICD-10-CM

## 2020-06-14 DIAGNOSIS — J449 Chronic obstructive pulmonary disease, unspecified: Secondary | ICD-10-CM | POA: Diagnosis not present

## 2020-06-14 DIAGNOSIS — E039 Hypothyroidism, unspecified: Secondary | ICD-10-CM

## 2020-06-14 MED ORDER — AMLODIPINE BESYLATE 10 MG PO TABS: 10.0000 mg | ORAL_TABLET | Freq: Every day | ORAL | 3 refills | Status: DC

## 2020-06-14 MED ORDER — ALBUTEROL SULFATE HFA 108 (90 BASE) MCG/ACT IN AERS: 1.0000 | INHALATION_SPRAY | Freq: Four times a day (QID) | RESPIRATORY_TRACT | 5 refills | Status: DC | PRN

## 2020-06-14 NOTE — Assessment & Plan Note (Signed)
On Cymbalta 30 mg QD ?On Norco PRN, f/u with Dr [place] ?

## 2020-06-14 NOTE — Patient Instructions (Addendum)
Please continue taking medications as prescribed.  Please contact your Nephrologist - [phone].

## 2020-06-14 NOTE — Assessment & Plan Note (Signed)
On omeprazole.  

## 2020-06-14 NOTE — Progress Notes (Signed)
Established Patient Office Visit  Subjective:  Patient ID: [name], female    DOB: 10/01/1935  Age: 85 y.o. MRN: [id number]  CC:  Chief Complaint  Patient presents with  . Follow-up    Follow up pt is having right arm pain near shoulder also needs refill on albuterol inhaler and amlodipine     HPI [name]  is an 85 year old female with past medical history of uncontrolled hypertension, CKD stage IV, fibromyalgia, chronic pelvic pain, chronic constipation, hypothyroidism, osteoarthritis of knees and depressionwho presents for follow-up of her chronic medical conditions.  Her BP was elevated in the office today.  She denies any headache, dizziness, chest pain, dyspnea at rest or palpitations.  She states that she has not taken her routine blood pressure medication today.  She has history of CKD stage IV and elevated light chain levels.  She needs to have all follow-up visit with her nephrologist.  She also follows up with hematooncologist.  She states that she does not want to start dialysis for CKD.  She complains of right arm pain, which is chronic.  She follows up with Dr. Merlene Laughter for fibromyalgia and has been taking Cymbalta and as needed Norco.  She has been feeling tired most of the time. She takes her Levothyroxine regularly. Denies weight gain, loss of appetite or hair or skin changes.  Past Medical History:  Diagnosis Date  . Anxiety disorder, unspecified   . Arteriosclerotic cardiovascular disease (ASCVD) 2006   Nonobstructive; < 50% lesions on cath 2002; negative stress nuclear study in 10/2004  . Asthma   . Chest pain, unspecified   . Chronic kidney disease, unspecified   . Chronic obstructive pulmonary disease, unspecified ([place])   . Chronic pelvic pain in female   . Colitis due to Clostridium difficile [place]  . COPD (chronic obstructive pulmonary disease) ([place])   . Depression with anxiety   . Diarrhea, unspecified   . Disorder of thyroid,  unspecified   . Gastro-esophageal reflux disease without esophagitis   . Gastroesophageal reflux disease   . Hyperlipidemia   . Hyperlipidemia, unspecified   . Hypertension   . Hypertensive chronic kidney disease with stage 1 through stage 4 chronic kidney disease, or unspecified chronic kidney disease   . Hyperthyroidism   . Hypothyroidism   . Hypothyroidism, unspecified   . Kidney disease, chronic, stage III (moderate, EGFR 30-59 ml/min) (HCC)   . Lower abdominal pain, unspecified   . Major depressive disorder, single episode, severe without psychotic features ([place])   . Major depressive disorder, single episode, unspecified   . Nausea   . Other postherpetic nervous system involvement   . Pelvic and perineal pain   . Rash and other nonspecific skin eruption   . S/P colonoscopy 2007   few diverticula, otherwise nl  . S/P colonoscopy 12/13/10   rectal, cecal polyp, left-sided diverticulosis, hyperplastic. ?anal fissure? treated empirically  . S/P endoscopy 2009   linear esophageal erosions  . S/P endoscopy 05/10/10   Question island of salmon-colored epithelium in distal esophagus ; no Barrett's.   . Shingles   . Tobacco abuse, in remission    55-pack-year consumption; discontinued 08/2010  . Unspecified asthma, uncomplicated   . Unspecified osteoarthritis, unspecified site   . Urinary tract infection, site not specified     Past Surgical History:  Procedure Laterality Date  . ABDOMINAL HYSTERECTOMY    . BREAST LUMPECTOMY    . CATARACT EXTRACTION    .  CHOLECYSTECTOMY  1962  . COLONOSCOPY  12/13/2010   Left-sided diverticulosis.  Cecal polyp, status post hot snare polypectomy/ Diminutive rectal polyp, status post cold biopsy removal tender/painful anal canal, ? occult anal fissure- Not visualized  . COLONOSCOPY N/A 10/14/2015   Diverticulosis  . ESOPHAGOGASTRODUODENOSCOPY  05/10/10   benign mucosa with mild chronic inflammation.  . ESOPHAGOGASTRODUODENOSCOPY (EGD) WITH PROPOFOL  N/A 02/13/2019   Procedure: ESOPHAGOGASTRODUODENOSCOPY (EGD) WITH PROPOFOL;  Surgeon: Daneil Dolin, MD;  Location: [facility];  Service: Endoscopy;  Laterality: N/A;  3:00pm  . FLEXIBLE SIGMOIDOSCOPY  12/29/2011   Procedure: FLEXIBLE SIGMOIDOSCOPY;  Surgeon: Rogene Houston, MD;  Location: [facility];  Service: Endoscopy;  Laterality: N/A;  200-Ann notified pt to be here @ 1:00  . NISSEN FUNDOPLICATION    . YAG LASER APPLICATION Left 2/58/5277   Procedure: YAG LASER APPLICATION;  Surgeon: Williams Che, MD;  Location: [facility];  Service: Ophthalmology;  Laterality: Left;    Family History  Problem Relation Age of Onset  . Diabetes Mother   . Alzheimer's disease Mother   . Cancer Mother   . Heart disease Father   . Cancer Sister   . Heart disease Brother        Also mother, Father, brother and 2 sons  . Cancer Son        testicular cancer   . Aneurysm Son   . Diabetes Son   . Hypertension Brother        also Sister x2  . Colon cancer Neg Hx     Social History   Socioeconomic History  . Marital status: Married    Spouse name: Not on file  . Number of children: 4  . Years of education: Not on file  . Highest education level: Not on file  Occupational History  . Not on file  Tobacco Use  . Smoking status: Former Smoker    Packs/day: 1.00    Years: 55.00    Pack years: 55.00    Quit date: 09/15/2010    Years since quitting: 9.7  . Smokeless tobacco: Never Used  Vaping Use  . Vaping Use: Never used  Substance and Sexual Activity  . Alcohol use: No    Alcohol/week: 0.0 standard drinks  . Drug use: No  . Sexual activity: Not Currently  Other Topics Concern  . Not on file  Social History Narrative  . Not on file   Social Determinants of Health   Financial Resource Strain: Low Risk   . Difficulty of Paying Living Expenses: Not hard at all  Food Insecurity: No Food Insecurity  . Worried About Charity fundraiser in the Last Year: Never true  . Ran Out of  Food in the Last Year: Never true  Transportation Needs: No Transportation Needs  . Lack of Transportation (Medical): No  . Lack of Transportation (Non-Medical): No  Physical Activity: Insufficiently Active  . Days of Exercise per Week: 7 days  . Minutes of Exercise per Session: 20 min  Stress: Stress Concern Present  . Feeling of Stress : Rather much  Social Connections: Moderately Integrated  . Frequency of Communication with Friends and Family: More than three times a week  . Frequency of Social Gatherings with Friends and Family: More than three times a week  . Attends Religious Services: More than 4 times per year  . Active Member of Clubs or Organizations: No  . Attends Archivist Meetings: Never  . Marital Status:  Married  Intimate Partner Violence: Not At Risk  . Fear of Current or Ex-Partner: No  . Emotionally Abused: No  . Physically Abused: No  . Sexually Abused: No    Outpatient Medications Prior to Visit  Medication Sig Dispense Refill  . atorvastatin (LIPITOR) 20 MG tablet Take 20 mg every evening by mouth.     . carvedilol (COREG) 6.25 MG tablet Take 1 tablet (6.25 mg total) by mouth 2 (two) times daily. 180 tablet 3  . gabapentin (NEURONTIN) 100 MG capsule TAKE 1 CAPSULE BY MOUTH THREE TIMES A DAY. 90 capsule 0  . hydrochlorothiazide (HYDRODIURIL) 25 MG tablet Take 25 mg by mouth daily.    Marland Kitchen HYDROcodone-acetaminophen (NORCO) 10-325 MG tablet Take 0.5-2 tablets by mouth every 4 (four) hours as needed (1/2 tablet for mild pain, 1 tablet for moderate pain, 2 tablets for severe pain). 80 tablet 0  . hydroxypropyl methylcellulose (ISOPTO TEARS) 2.5 % ophthalmic solution Place 1 drop into both eyes 3 (three) times daily as needed. For dry eyes.    Marland Kitchen levothyroxine (SYNTHROID) 75 MCG tablet TAKE (1) TABLET BY MOUTH EACH MORNING. 90 tablet 0  . LINZESS 145 MCG CAPS capsule TAKE 1 CAPSULE ONCE DAILY BEFORE BREAKFAST. 90 capsule 3  . lisinopril (ZESTRIL) 5 MG tablet  Take 5 mg by mouth daily.    . Multiple Vitamins-Minerals (EYE VITAMINS PO) Take 1 tablet by mouth daily.     Marland Kitchen omeprazole (PRILOSEC) 40 MG capsule TAKE (1) CAPSULE BY MOUTH TWICE DAILY FOR REFLUX. 180 capsule 0  . ondansetron (ZOFRAN) 4 MG tablet TAKE 1 TABLET BY MOUTH THREE TIMES AS NEEDED FOR NAUSEA. 90 tablet 0  . potassium chloride SA (K-DUR) 20 MEQ tablet Take 20 mEq by mouth daily with lunch.     . albuterol (PROVENTIL HFA;VENTOLIN HFA) 108 (90 Base) MCG/ACT inhaler Inhale 1-2 puffs into the lungs every 6 (six) hours as needed for wheezing or shortness of breath.    . DULoxetine (CYMBALTA) 30 MG capsule Take 1 capsule (30 mg total) by mouth daily. 90 capsule 0  . amLODipine (NORVASC) 10 MG tablet Take 1 tablet (10 mg total) by mouth daily. 180 tablet 3   No facility-administered medications prior to visit.    Allergies  Allergen Reactions  . Hydralazine     Breathing  . Paroxetine Itching  . Sulfa Antibiotics Other (See Comments)    Childhood reaction.  . Venlafaxine Itching  . Nalbuphine Rash    ROS Review of Systems  Constitutional: Positive for fatigue. Negative for appetite change, chills and fever.  HENT: Negative for congestion, sinus pressure, sinus pain and sore throat.   Eyes: Negative for pain and discharge.  Respiratory: Negative for cough and shortness of breath.   Cardiovascular: Negative for chest pain and palpitations.  Gastrointestinal: Negative for abdominal pain, constipation, diarrhea, nausea and vomiting.  Endocrine: Negative for polydipsia and polyuria.  Genitourinary: Negative for dysuria and hematuria.  Musculoskeletal: Positive for myalgias. Negative for neck pain and neck stiffness.  Skin: Negative for rash.  Neurological: Negative for dizziness and weakness.  Psychiatric/Behavioral: Negative for agitation and behavioral problems.      Objective:    Physical Exam Vitals reviewed.  Constitutional:      General: She is not in acute  distress.    Appearance: She is not diaphoretic.  HENT:     Head: Normocephalic and atraumatic.     Nose: Nose normal.     Mouth/Throat:     Mouth: Mucous  membranes are moist.  Eyes:     General: No scleral icterus.    Extraocular Movements: Extraocular movements intact.  Cardiovascular:     Rate and Rhythm: Normal rate and regular rhythm.     Pulses: Normal pulses.     Heart sounds: Normal heart sounds. No murmur heard.   Pulmonary:     Breath sounds: Normal breath sounds. No wheezing or rales.  Abdominal:     Palpations: Abdomen is soft.     Tenderness: There is no abdominal tenderness.  Musculoskeletal:     Cervical back: Neck supple. No tenderness.     Right lower leg: No edema.     Left lower leg: No edema.  Skin:    General: Skin is warm.     Findings: No rash.  Neurological:     General: No focal deficit present.     Mental Status: She is alert and oriented to person, place, and time.     Sensory: No sensory deficit.     Motor: No weakness.  Psychiatric:        Mood and Affect: Mood normal.        Behavior: Behavior normal.     BP (!) 161/82 (BP Location: Right Arm, Patient Position: Sitting, Cuff Size: Normal)   Pulse 66   Temp 98 F (36.7 C) (Oral)   Resp 16   Ht 5' (1.524 m)   Wt 143 lb 1.9 oz (64.9 kg)   SpO2 98%   BMI 27.95 kg/m  Wt Readings from Last 3 Encounters:  06/14/20 143 lb 1.9 oz (64.9 kg)  03/15/20 143 lb 1.9 oz (64.9 kg)  03/09/20 146 lb 3.2 oz (66.3 kg)     Health Maintenance Due  Topic Date Due  . COVID-19 Vaccine (1) Never done  . TETANUS/TDAP  Never done    There are no preventive care reminders to display for this patient.  Lab Results  Component Value Date   TSH 0.406 (L) 12/22/2019   Lab Results  Component Value Date   WBC 6.3 12/01/2019   HGB 11.5 (L) 12/01/2019   HCT 36.7 12/01/2019   MCV 93.9 12/01/2019   PLT 212 12/01/2019   Lab Results  Component Value Date   NA 141 12/01/2019   K 4.2 12/01/2019   CO2  23 12/01/2019   GLUCOSE 116 (H) 12/01/2019   BUN 32 (H) 12/01/2019   CREATININE 1.82 (H) 12/01/2019   BILITOT 0.4 11/26/2018   ALKPHOS 50 11/26/2018   AST 12 (L) 11/26/2018   ALT 12 11/26/2018   PROT 6.6 11/26/2018   ALBUMIN 3.5 11/26/2018   CALCIUM 8.9 12/01/2019   ANIONGAP 8 12/01/2019   Lab Results  Component Value Date   CHOL 164 09/17/2018   Lab Results  Component Value Date   HDL 44 09/17/2018   Lab Results  Component Value Date   LDLCALC 95 09/17/2018   Lab Results  Component Value Date   TRIG 145 09/17/2018   Lab Results  Component Value Date   CHOLHDL 2.9 07/15/2008   Lab Results  Component Value Date   HGBA1C 5.6 02/02/2020   HGBA1C 5.6 02/02/2020   HGBA1C 5.6 (A) 02/02/2020   HGBA1C 5.6 02/02/2020      Assessment & Plan:   Problem List Items Addressed This Visit      Cardiovascular and Mediastinum   Hypertension - Primary    BP Readings from Last 1 Encounters:  06/14/20 (!) 161/82   Uncontrolled with amlodipine 10  mg daily, Coreg 6.25 mg twice daily, HCTZ 25 mg daily and lisinopril 5 mg daily Counseled for compliance with the medications Advised DASH diet and moderate exercise/walking as tolerated Chronic pain and CKD also contributing to high BP Needs a follow up visit with Nephrologist      Relevant Medications   amLODipine (NORVASC) 10 MG tablet     Respiratory   Chronic obstructive pulmonary disease, unspecified (HCC)    Controlled with Albuterol inhaler PRN      Relevant Medications   albuterol (VENTOLIN HFA) 108 (90 Base) MCG/ACT inhaler     Digestive   Gastro-esophageal reflux disease without esophagitis    On omeprazole        Endocrine   Hypothyroidism    Continue Levothyroxine to 75 mcg QD Check TSH and T4      Relevant Orders   TSH + free T4   Hemoglobin A1c   US THYROID     Genitourinary   CKD (chronic kidney disease) stage 4, GFR 15-29 ml/min (HCC)    Follows up with nephrologist, needs a follow up  visit Follows up with hematooncologist for evaluation of elevated light chain levels Advised to follow renal diet as advised by nephrologist and dietitian Avoid nephrotoxic agents including NSAIDs        Other   Chronic pain syndrome    On Cymbalta 30 mg QD On Norco PRN, f/u with Dr Merlene Laughter       Other Visit Diagnoses    Prediabetes       Relevant Orders   Hemoglobin A1c      Meds ordered this encounter  Medications  . albuterol (VENTOLIN HFA) 108 (90 Base) MCG/ACT inhaler    Sig: Inhale 1-2 puffs into the lungs every 6 (six) hours as needed for wheezing or shortness of breath.    Dispense:  8 g    Refill:  5  . amLODipine (NORVASC) 10 MG tablet    Sig: Take 1 tablet (10 mg total) by mouth daily.    Dispense:  90 tablet    Refill:  3    12/17/19 dose increased to 10 mg    Follow-up: Return in about 4 months (around 10/14/2020) for HTN and hypothyroidism.    Lindell Spar, MD

## 2020-06-14 NOTE — Assessment & Plan Note (Signed)
BP Readings from Last 1 Encounters:  06/14/20 (!) 161/82   Uncontrolled with amlodipine 10 mg daily, Coreg 6.25 mg twice daily, HCTZ 25 mg daily and lisinopril 5 mg daily Counseled for compliance with the medications Advised DASH diet and moderate exercise/walking as tolerated Chronic pain and CKD also contributing to high BP Needs a follow up visit with Nephrologist

## 2020-06-14 NOTE — Assessment & Plan Note (Signed)
Controlled with Albuterol inhaler PRN

## 2020-06-14 NOTE — Assessment & Plan Note (Signed)
Follows up with nephrologist, needs a follow up visit Follows up with hematooncologist for evaluation of elevated light chain levels Advised to follow renal diet as advised by nephrologist and dietitian Avoid nephrotoxic agents including NSAIDs

## 2020-06-14 NOTE — Assessment & Plan Note (Signed)
Continue Levothyroxine to 75 mcg QD Check TSH and T4

## 2020-06-16 ENCOUNTER — Other Ambulatory Visit: Payer: Self-pay | Admitting: Internal Medicine

## 2020-06-16 DIAGNOSIS — E039 Hypothyroidism, unspecified: Secondary | ICD-10-CM

## 2020-06-16 LAB — TSH+FREE T4
Free T4: 0.98 ng/dL (ref 0.82–1.77)
TSH: 4.6 u[IU]/mL — ABNORMAL HIGH (ref 0.450–4.500)

## 2020-06-16 LAB — HEMOGLOBIN A1C
Est. average glucose Bld gHb Est-mCnc: 105 mg/dL
Hgb A1c MFr Bld: 5.3 % (ref 4.8–5.6)

## 2020-06-16 MED ORDER — LEVOTHYROXINE SODIUM 88 MCG PO TABS: 88.0000 ug | ORAL_TABLET | Freq: Every day | ORAL | 1 refills | Status: DC

## 2020-06-23 NOTE — Progress Notes (Signed)
Cardiology Office Note    Date:  06/24/2020   ID:  [name], [name] 1935-11-05, MRN [id number]  PCP:  Lindell Spar, MD  Cardiologist: Dorris Carnes, MD    Chief Complaint  Patient presents with  . Follow-up    5 month visit    History of Present Illness:    [name] is a 85 y.o. female with past medical history of HTN, HLD, palpitations (PAC's, PVC's and SVT by prior monitor), nonobstructive CAD by cardiac catheterization in 2002 (low-risk NST in 03/2011),Stage 3-4 CKD and COPD who presents to the office today for 82-month follow-up.    She was last examined by myself in 11/2019 and reported being under increased stress as her dog had recently passed away and she felt this was contributing to her elevated BP readings. BP was at 150/90 during her visit and Amlodipine was titrated from $RemoveBefor'5mg'uAQsUNvBjQYF$  daily to $Remove'10mg'XxAvbIr$  daily. Was not started on an ACE-I or ARB given her variable renal function and had previously been intolerant to Hydralazine.   In talking with the patient today, she reports her main issue since her last visit has been worsening arthritis in her knees and she is hoping to schedule a visit with Orthopedics soon to have repeat injections. Her arthritis does significantly limit her activity. She reports her breathing has overall been stable and denies any orthopnea, PND or lower extremity edema. No recent chest pain or palpitations  Her blood pressure was significantly elevated at a recent office visit with her PCP but but she reports she had not yet taken her morning medications. She did take her medications today and BP is well controlled at 132/76 during today's visit.   Past Medical History:  Diagnosis Date  . Anxiety disorder, unspecified   . Arteriosclerotic cardiovascular disease (ASCVD) 2006   Nonobstructive; < 50% lesions on cath 2002; negative stress nuclear study in 10/2004  . Asthma   . Chest pain, unspecified   . Chronic kidney disease, unspecified   . Chronic  obstructive pulmonary disease, unspecified ([place])   . Chronic pelvic pain in female   . Colitis due to Clostridium difficile [place]  . COPD (chronic obstructive pulmonary disease) ([place])   . Depression with anxiety   . Diarrhea, unspecified   . Disorder of thyroid, unspecified   . Gastro-esophageal reflux disease without esophagitis   . Gastroesophageal reflux disease   . Hyperlipidemia   . Hyperlipidemia, unspecified   . Hypertension   . Hypertensive chronic kidney disease with stage 1 through stage 4 chronic kidney disease, or unspecified chronic kidney disease   . Hyperthyroidism   . Hypothyroidism   . Hypothyroidism, unspecified   . Kidney disease, chronic, stage III (moderate, EGFR 30-59 ml/min) (HCC)   . Lower abdominal pain, unspecified   . Major depressive disorder, single episode, severe without psychotic features ([place])   . Major depressive disorder, single episode, unspecified   . Nausea   . Other postherpetic nervous system involvement   . Pelvic and perineal pain   . Rash and other nonspecific skin eruption   . S/P colonoscopy 2007   few diverticula, otherwise nl  . S/P colonoscopy 12/13/10   rectal, cecal polyp, left-sided diverticulosis, hyperplastic. ?anal fissure? treated empirically  . S/P endoscopy 2009   linear esophageal erosions  . S/P endoscopy 05/10/10   Question island of salmon-colored epithelium in distal esophagus ; no Barrett's.   . Shingles   . Tobacco abuse, in remission  55-pack-year consumption; discontinued 08/2010  . Unspecified asthma, uncomplicated   . Unspecified osteoarthritis, unspecified site   . Urinary tract infection, site not specified     Past Surgical History:  Procedure Laterality Date  . ABDOMINAL HYSTERECTOMY    . BREAST LUMPECTOMY    . CATARACT EXTRACTION    . CHOLECYSTECTOMY  1962  . COLONOSCOPY  12/13/2010   Left-sided diverticulosis.  Cecal polyp, status post hot snare polypectomy/ Diminutive rectal polyp, status  post cold biopsy removal tender/painful anal canal, ? occult anal fissure- Not visualized  . COLONOSCOPY N/A 10/14/2015   Diverticulosis  . ESOPHAGOGASTRODUODENOSCOPY  05/10/10   benign mucosa with mild chronic inflammation.  . ESOPHAGOGASTRODUODENOSCOPY (EGD) WITH PROPOFOL N/A 02/13/2019   Procedure: ESOPHAGOGASTRODUODENOSCOPY (EGD) WITH PROPOFOL;  Surgeon: Corbin Ade, MD;  Location: [facility];  Service: Endoscopy;  Laterality: N/A;  3:00pm  . FLEXIBLE SIGMOIDOSCOPY  12/29/2011   Procedure: FLEXIBLE SIGMOIDOSCOPY;  Surgeon: Malissa Hippo, MD;  Location: [facility];  Service: Endoscopy;  Laterality: N/A;  200-Ann notified pt to be here @ 1:00  . NISSEN FUNDOPLICATION    . YAG LASER APPLICATION Left 05/25/2014   Procedure: YAG LASER APPLICATION;  Surgeon: Susa Simmonds, MD;  Location: [facility];  Service: Ophthalmology;  Laterality: Left;    Current Medications: Outpatient Medications Prior to Visit  Medication Sig Dispense Refill  . albuterol (VENTOLIN HFA) 108 (90 Base) MCG/ACT inhaler Inhale 1-2 puffs into the lungs every 6 (six) hours as needed for wheezing or shortness of breath. 8 g 5  . amLODipine (NORVASC) 10 MG tablet Take 1 tablet (10 mg total) by mouth daily. 90 tablet 3  . atorvastatin (LIPITOR) 20 MG tablet Take 20 mg every evening by mouth.     . carvedilol (COREG) 6.25 MG tablet Take 1 tablet (6.25 mg total) by mouth 2 (two) times daily. 180 tablet 3  . DULoxetine (CYMBALTA) 30 MG capsule Take 1 capsule (30 mg total) by mouth daily. 90 capsule 0  . gabapentin (NEURONTIN) 100 MG capsule TAKE 1 CAPSULE BY MOUTH THREE TIMES A DAY. 90 capsule 0  . hydrochlorothiazide (HYDRODIURIL) 25 MG tablet Take 25 mg by mouth daily.    Marland Kitchen HYDROcodone-acetaminophen (NORCO) 10-325 MG tablet Take 0.5-2 tablets by mouth every 4 (four) hours as needed (1/2 tablet for mild pain, 1 tablet for moderate pain, 2 tablets for severe pain). 80 tablet 0  . hydroxypropyl methylcellulose (ISOPTO  TEARS) 2.5 % ophthalmic solution Place 1 drop into both eyes 3 (three) times daily as needed. For dry eyes.    Marland Kitchen levothyroxine (SYNTHROID) 88 MCG tablet Take 1 tablet (88 mcg total) by mouth daily before breakfast. 90 tablet 1  . LINZESS 145 MCG CAPS capsule TAKE 1 CAPSULE ONCE DAILY BEFORE BREAKFAST. 90 capsule 3  . lisinopril (ZESTRIL) 5 MG tablet Take 5 mg by mouth daily.    . Multiple Vitamins-Minerals (EYE VITAMINS PO) Take 1 tablet by mouth daily.     Marland Kitchen omeprazole (PRILOSEC) 40 MG capsule TAKE (1) CAPSULE BY MOUTH TWICE DAILY FOR REFLUX. 180 capsule 0  . ondansetron (ZOFRAN) 4 MG tablet TAKE 1 TABLET BY MOUTH THREE TIMES AS NEEDED FOR NAUSEA. 90 tablet 0  . potassium chloride SA (K-DUR) 20 MEQ tablet Take 20 mEq by mouth daily with lunch.      No facility-administered medications prior to visit.     Allergies:   Hydralazine, Paroxetine, Sulfa antibiotics, Venlafaxine, and Nalbuphine   Social History   Socioeconomic  History  . Marital status: Married    Spouse name: Not on file  . Number of children: 4  . Years of education: Not on file  . Highest education level: Not on file  Occupational History  . Not on file  Tobacco Use  . Smoking status: Former Smoker    Packs/day: 1.00    Years: 55.00    Pack years: 55.00    Quit date: 09/15/2010    Years since quitting: 9.7  . Smokeless tobacco: Never Used  Vaping Use  . Vaping Use: Never used  Substance and Sexual Activity  . Alcohol use: No    Alcohol/week: 0.0 standard drinks  . Drug use: No  . Sexual activity: Not Currently  Other Topics Concern  . Not on file  Social History Narrative  . Not on file   Social Determinants of Health   Financial Resource Strain: Low Risk   . Difficulty of Paying Living Expenses: Not hard at all  Food Insecurity: No Food Insecurity  . Worried About Charity fundraiser in the Last Year: Never true  . Ran Out of Food in the Last Year: Never true  Transportation Needs: No Transportation  Needs  . Lack of Transportation (Medical): No  . Lack of Transportation (Non-Medical): No  Physical Activity: Insufficiently Active  . Days of Exercise per Week: 7 days  . Minutes of Exercise per Session: 20 min  Stress: Stress Concern Present  . Feeling of Stress : Rather much  Social Connections: Moderately Integrated  . Frequency of Communication with Friends and Family: More than three times a week  . Frequency of Social Gatherings with Friends and Family: More than three times a week  . Attends Religious Services: More than 4 times per year  . Active Member of Clubs or Organizations: No  . Attends Archivist Meetings: Never  . Marital Status: Married     Family History:  The patient's family history includes Alzheimer's disease in her mother; Aneurysm in her son; Cancer in her mother, sister, and son; Diabetes in her mother and son; Heart disease in her brother and father; Hypertension in her brother.   Review of Systems:   Please see the history of present illness.     General:  No chills, fever, night sweats or weight changes. Positive for joint pain. Cardiovascular:  No chest pain, dyspnea on exertion, edema, orthopnea, palpitations, paroxysmal nocturnal dyspnea. Dermatological: No rash, lesions/masses Respiratory: No cough, dyspnea Urologic: No hematuria, dysuria Abdominal:   No nausea, vomiting, diarrhea, bright red blood per rectum, melena, or hematemesis Neurologic:  No visual changes, wkns, changes in mental status. All other systems reviewed and are otherwise negative except as noted above.   Physical Exam:    VS:  BP 132/76   Pulse 81   Ht 5' (1.524 m)   Wt 141 lb 9.6 oz (64.2 kg)   SpO2 96%   BMI 27.65 kg/m    General: Well developed, elderly female appearing in no acute distress. Head: Normocephalic, atraumatic. Neck: No carotid bruits. JVD not elevated.  Lungs: Respirations regular and unlabored, without wheezes or rales.  Heart: Regular rate  and rhythm. No S3 or S4.  No murmur, no rubs, or gallops appreciated. Abdomen: Appears non-distended. No obvious abdominal masses. Msk:  Strength and tone appear normal for age. No obvious joint deformities or effusions. Extremities: No clubbing or cyanosis. No lower extremity edema.  Distal pedal pulses are 2+ bilaterally. Neuro: Alert and oriented X  3. Moves all extremities spontaneously. No focal deficits noted. Psych:  Responds to questions appropriately with a normal affect. Skin: No rashes or lesions noted  Wt Readings from Last 3 Encounters:  06/24/20 141 lb 9.6 oz (64.2 kg)  06/14/20 143 lb 1.9 oz (64.9 kg)  03/15/20 143 lb 1.9 oz (64.9 kg)     Studies/Labs Reviewed:   EKG:  EKG is not ordered today.    Recent Labs: 07/24/2019: Magnesium 2.1 12/01/2019: BUN 32; Creatinine, Ser 1.82; Hemoglobin 11.5; Platelets 212; Potassium 4.2; Sodium 141 06/14/2020: TSH 4.600   Lipid Panel    Component Value Date/Time   CHOL 164 09/17/2018 0000   TRIG 145 09/17/2018 0000   HDL 44 09/17/2018 0000   CHOLHDL 2.9 07/15/2008 0528   VLDL 23 07/15/2008 0528   LDLCALC 95 09/17/2018 0000    Additional studies/ records that were reviewed today include:   Echocardiogram: 09/2015 Study Conclusions   - Left ventricle: The cavity size was normal. Wall thickness was  normal. Systolic function was normal. The estimated ejection  fraction was in the range of 55% to 60%. Wall motion was normal;  there were no regional wall motion abnormalities. Doppler  parameters are consistent with abnormal left ventricular  relaxation (grade 1 diastolic dysfunction).  - Aortic valve: Trileaflet; mildly calcified leaflets.  - Mitral valve: Mildly thickened leaflets . There was mild  regurgitation.  - Left atrium: The atrium was at the upper limits of normal in  size.  - Right atrium: Central venous pressure (est): 3 mm Hg.  - Tricuspid valve: There was trivial regurgitation.  - Pulmonary  arteries: PA peak pressure: 34 mm Hg (S).  - Pericardium, extracardiac: There was no pericardial effusion.   Event Monitor: 08/2019 Sinus rhythm  With occasional PACs, short burst SVT(5 beats)   42 to 171 bpm   Average HR 60 NO significant pauses    Assessment:    1. Palpitations   2. Coronary artery disease involving native coronary artery of native heart without angina pectoris   3. Essential hypertension   4. Hyperlipidemia LDL goal <70   5. Stage 3 chronic kidney disease, unspecified whether stage 3a or 3b CKD ([place])      Plan:   In order of problems listed above:  1. Palpitations - She had PAC's, PVC's and short bursts of SVT by prior monitor in 08/2019. She reports symptoms have overall been well controlled since her last visit. Continue Coreg 6.25 mg twice daily. Have not further titrated given her HR in the 50's at times.   2. CAD - She had nonobstructive CAD by cardiac catheterization in 2002 and NST in 03/2011 was low-risk. She denies any recent anginal symptoms. Continue with risk factor modification.  Remains on Atorvastatin 20 mg daily and Coreg 6.25 mg twice daily. Previously had easy bruising with ASA.  3. HTN - BP is well controlled at 132/76 during today's visit. She reports she did not take her medications prior to her recent visit with her PCP. I encouraged her to try to take her medications on a regular schedule and to not miss doses. - Currently on Amlodipine $RemoveBefor'10mg'IaPABFbqbFTI$  daily, HCTZ 25 mg daily, Coreg 6.$RemoveBefore'25mg'IEDcKiBHExrGx$  BID and Lisinopril $RemoveBefore'5mg'zvrCesaahsvxa$  daily. Will defer titration of Lisinopril to Nephrology given her underlying CKD.  4. HLD -Followed by PCP. She remains on Atorvastatin $RemoveBeforeD'20mg'niclXOtoctaJXv$  daily with goal LDL less than 70 in the setting of documented CAD.  5. Stage 3 CKD - Followed by Dr. Joelyn Oms. Baseline creatinine  1.7 - 1.8 with creatinine at 1.82 by most recent labs in 11/2019.    Medication Adjustments/Labs and Tests Ordered: Current medicines are reviewed at length with the  patient today.  Concerns regarding medicines are outlined above.  Medication changes, Labs and Tests ordered today are listed in the Patient Instructions below. Patient Instructions  Medication Instructions:  Your physician recommends that you continue on your current medications as directed. Please refer to the Current Medication list given to you today.  *If you need a refill on your cardiac medications before your next appointment, please call your pharmacy*   Lab Work: None If you have labs (blood work) drawn today and your tests are completely normal, you will receive your results only by: Marland Kitchen MyChart Message (if you have MyChart) OR . A paper copy in the mail If you have any lab test that is abnormal or we need to change your treatment, we will call you to review the results.   Testing/Procedures: None   Follow-Up: At [facility], you and your health needs are our priority.  As part of our continuing mission to provide you with exceptional heart care, we have created designated Provider Care Teams.  These Care Teams include your primary Cardiologist (physician) and Advanced Practice Providers (APPs -  Physician Assistants and Nurse Practitioners) who all work together to provide you with the care you need, when you need it.  We recommend signing up for the patient portal called "MyChart".  Sign up information is provided on this After Visit Summary.  MyChart is used to connect with patients for Virtual Visits (Telemedicine).  Patients are able to view lab/test results, encounter notes, upcoming appointments, etc.  Non-urgent messages can be sent to your provider as well.   To learn more about what you can do with MyChart, go to [email or web address].    Your next appointment:   6 month(s)  The format for your next appointment:   In Person  Provider:   Bernerd Pho, PA-C   Other Instructions None     Signed, Erma Heritage, PA-C  06/24/2020 4:40 PM    [place]. [address] [place], [place] [address] Phone: [phone] Fax: [phone]

## 2020-06-24 ENCOUNTER — Ambulatory Visit (INDEPENDENT_AMBULATORY_CARE_PROVIDER_SITE_OTHER): Payer: Medicare Other | Admitting: Student

## 2020-06-24 ENCOUNTER — Encounter: Payer: Self-pay | Admitting: Student

## 2020-06-24 ENCOUNTER — Other Ambulatory Visit: Payer: Self-pay

## 2020-06-24 VITALS — BP 132/76 | HR 81 | Ht 60.0 in | Wt 141.6 lb

## 2020-06-24 DIAGNOSIS — N183 Chronic kidney disease, stage 3 unspecified: Secondary | ICD-10-CM

## 2020-06-24 DIAGNOSIS — R002 Palpitations: Secondary | ICD-10-CM | POA: Diagnosis not present

## 2020-06-24 DIAGNOSIS — I2510 Atherosclerotic heart disease of native coronary artery without angina pectoris: Secondary | ICD-10-CM | POA: Diagnosis not present

## 2020-06-24 DIAGNOSIS — I10 Essential (primary) hypertension: Secondary | ICD-10-CM

## 2020-06-24 DIAGNOSIS — E785 Hyperlipidemia, unspecified: Secondary | ICD-10-CM

## 2020-06-24 NOTE — Patient Instructions (Signed)
Medication Instructions:  Your physician recommends that you continue on your current medications as directed. Please refer to the Current Medication list given to you today.  *If you need a refill on your cardiac medications before your next appointment, please call your pharmacy*   Lab Work: None If you have labs (blood work) drawn today and your tests are completely normal, you will receive your results only by: Marland Kitchen MyChart Message (if you have MyChart) OR . A paper copy in the mail If you have any lab test that is abnormal or we need to change your treatment, we will call you to review the results.   Testing/Procedures: None   Follow-Up: At [facility], you and your health needs are our priority.  As part of our continuing mission to provide you with exceptional heart care, we have created designated Provider Care Teams.  These Care Teams include your primary Cardiologist (physician) and Advanced Practice Providers (APPs -  Physician Assistants and Nurse Practitioners) who all work together to provide you with the care you need, when you need it.  We recommend signing up for the patient portal called "MyChart".  Sign up information is provided on this After Visit Summary.  MyChart is used to connect with patients for Virtual Visits (Telemedicine).  Patients are able to view lab/test results, encounter notes, upcoming appointments, etc.  Non-urgent messages can be sent to your provider as well.   To learn more about what you can do with MyChart, go to [email or web address].    Your next appointment:   6 month(s)  The format for your next appointment:   In Person  Provider:   Bernerd Pho, PA-C   Other Instructions None

## 2020-06-25 ENCOUNTER — Ambulatory Visit (HOSPITAL_COMMUNITY): Payer: Medicare Other

## 2020-07-28 DIAGNOSIS — M797 Fibromyalgia: Secondary | ICD-10-CM | POA: Diagnosis not present

## 2020-07-28 DIAGNOSIS — G894 Chronic pain syndrome: Secondary | ICD-10-CM | POA: Diagnosis not present

## 2020-07-28 DIAGNOSIS — Z79891 Long term (current) use of opiate analgesic: Secondary | ICD-10-CM | POA: Diagnosis not present

## 2020-07-28 DIAGNOSIS — M130 Polyarthritis, unspecified: Secondary | ICD-10-CM | POA: Diagnosis not present

## 2020-08-02 DIAGNOSIS — H353133 Nonexudative age-related macular degeneration, bilateral, advanced atrophic without subfoveal involvement: Secondary | ICD-10-CM | POA: Diagnosis not present

## 2020-08-02 DIAGNOSIS — H35341 Macular cyst, hole, or pseudohole, right eye: Secondary | ICD-10-CM | POA: Diagnosis not present

## 2020-08-03 ENCOUNTER — Ambulatory Visit (HOSPITAL_COMMUNITY)
Admission: RE | Admit: 2020-08-03 | Discharge: 2020-08-03 | Disposition: A | Discharge status: Home / Self Care | Payer: Medicare Other | Source: Ambulatory Visit | Attending: Internal Medicine | Admitting: Internal Medicine

## 2020-08-03 ENCOUNTER — Other Ambulatory Visit: Payer: Self-pay

## 2020-08-03 ENCOUNTER — Ambulatory Visit (INDEPENDENT_AMBULATORY_CARE_PROVIDER_SITE_OTHER): Payer: Medicare Other | Admitting: Internal Medicine

## 2020-08-03 ENCOUNTER — Encounter: Payer: Self-pay | Admitting: Internal Medicine

## 2020-08-03 VITALS — BP 158/77 | HR 55 | Temp 98.2°F | Resp 16 | Ht 60.0 in | Wt 142.8 lb

## 2020-08-03 DIAGNOSIS — M25561 Pain in right knee: Secondary | ICD-10-CM | POA: Insufficient documentation

## 2020-08-03 DIAGNOSIS — W19XXXA Unspecified fall, initial encounter: Secondary | ICD-10-CM | POA: Insufficient documentation

## 2020-08-03 DIAGNOSIS — M7989 Other specified soft tissue disorders: Secondary | ICD-10-CM | POA: Insufficient documentation

## 2020-08-03 DIAGNOSIS — M1711 Unilateral primary osteoarthritis, right knee: Secondary | ICD-10-CM | POA: Diagnosis not present

## 2020-08-03 DIAGNOSIS — I10 Essential (primary) hypertension: Secondary | ICD-10-CM

## 2020-08-03 DIAGNOSIS — Y939 Activity, unspecified: Secondary | ICD-10-CM | POA: Diagnosis not present

## 2020-08-03 DIAGNOSIS — N184 Chronic kidney disease, stage 4 (severe): Secondary | ICD-10-CM

## 2020-08-03 DIAGNOSIS — Y929 Unspecified place or not applicable: Secondary | ICD-10-CM | POA: Insufficient documentation

## 2020-08-03 MED ORDER — AMLODIPINE BESYLATE 5 MG PO TABS: 5.0000 mg | ORAL_TABLET | Freq: Every day | ORAL | 3 refills | Status: DC

## 2020-08-03 MED ORDER — CHLORTHALIDONE 25 MG PO TABS: 25.0000 mg | ORAL_TABLET | Freq: Every day | ORAL | 2 refills | Status: DC

## 2020-08-03 NOTE — Assessment & Plan Note (Signed)
Follows up with nephrologist, needs a follow up visit Follows up with hematooncologist for evaluation of elevated light chain levels Advised to follow renal diet as advised by nephrologist and dietitian Avoid nephrotoxic agents including NSAIDs Was on HCTZ, on Chlorthalidone now

## 2020-08-03 NOTE — Patient Instructions (Addendum)
Please start taking Chlorthalidone instead of Hydrochlorothiazide.  Please start taking Amlodipine 5 mg instead of 10 mg.  Please contact Nephrologist office to schedule a follow up appointment.  Please cut down soft drink intake.

## 2020-08-03 NOTE — Progress Notes (Signed)
Established Patient Office Visit  Subjective:  Patient ID: [name], female    DOB: 10-21-1935  Age: 85 y.o. MRN: [id number]  CC:  Chief Complaint  Patient presents with  . Leg Swelling    Legs and feet swelling about 2 weeks she fell about a week ago but there was already swelling before then right is worse than the left     HPI [name] is an 85 year old femalewith past medical history of uncontrolled hypertension, CKD stage IV, fibromyalgia, chronic pelvic pain, chronic constipation, hypothyroidism, osteoarthritis of knees and depressionwho presents for evaluation of persistent leg swelling.  She complains of persistent right more than left LE swelling for last few months.  She reports a mechanical fall at home and fell over her right knee.  Denies any dizziness, chest pain, dyspnea or palpitations before the fall.  Bruising over her right leg has been improving since then.  She states that her leg swelling was present even before the fall.  Her blood pressure was elevated in the office today.  She takes amlodipine 10 Mg daily, Lisinopril 10 mg QD, HCTZ 25 Mg daily and Coreg 6.25 mg BID.  She has not had a follow-up visit with her Nephrologist since 03/2020.  Past Medical History:  Diagnosis Date  . Anxiety disorder, unspecified   . Arteriosclerotic cardiovascular disease (ASCVD) 2006   Nonobstructive; < 50% lesions on cath 2002; negative stress nuclear study in 10/2004  . Asthma   . Chest pain, unspecified   . Chronic kidney disease, unspecified   . Chronic obstructive pulmonary disease, unspecified ([place])   . Chronic pelvic pain in female   . Colitis due to Clostridium difficile [place]  . COPD (chronic obstructive pulmonary disease) ([place])   . Depression with anxiety   . Diarrhea, unspecified   . Disorder of thyroid, unspecified   . Gastro-esophageal reflux disease without esophagitis   . Gastroesophageal reflux disease   . Hyperlipidemia   .  Hyperlipidemia, unspecified   . Hypertension   . Hypertensive chronic kidney disease with stage 1 through stage 4 chronic kidney disease, or unspecified chronic kidney disease   . Hyperthyroidism   . Hypothyroidism   . Hypothyroidism, unspecified   . Kidney disease, chronic, stage III (moderate, EGFR 30-59 ml/min) (HCC)   . Lower abdominal pain, unspecified   . Major depressive disorder, single episode, severe without psychotic features ([place])   . Major depressive disorder, single episode, unspecified   . Nausea   . Other postherpetic nervous system involvement   . Pelvic and perineal pain   . Rash and other nonspecific skin eruption   . S/P colonoscopy 2007   few diverticula, otherwise nl  . S/P colonoscopy 12/13/10   rectal, cecal polyp, left-sided diverticulosis, hyperplastic. ?anal fissure? treated empirically  . S/P endoscopy 2009   linear esophageal erosions  . S/P endoscopy 05/10/10   Question island of salmon-colored epithelium in distal esophagus ; no Barrett's.   . Shingles   . Tobacco abuse, in remission    55-pack-year consumption; discontinued 08/2010  . Unspecified asthma, uncomplicated   . Unspecified osteoarthritis, unspecified site   . Urinary tract infection, site not specified     Past Surgical History:  Procedure Laterality Date  . ABDOMINAL HYSTERECTOMY    . BREAST LUMPECTOMY    . CATARACT EXTRACTION    . CHOLECYSTECTOMY  1962  . COLONOSCOPY  12/13/2010   Left-sided diverticulosis.  Cecal polyp, status post hot snare polypectomy/  Diminutive rectal polyp, status post cold biopsy removal tender/painful anal canal, ? occult anal fissure- Not visualized  . COLONOSCOPY N/A 10/14/2015   Diverticulosis  . ESOPHAGOGASTRODUODENOSCOPY  05/10/10   benign mucosa with mild chronic inflammation.  . ESOPHAGOGASTRODUODENOSCOPY (EGD) WITH PROPOFOL N/A 02/13/2019   Procedure: ESOPHAGOGASTRODUODENOSCOPY (EGD) WITH PROPOFOL;  Surgeon: Daneil Dolin, MD;  Location: [facility];  Service: Endoscopy;  Laterality: N/A;  3:00pm  . FLEXIBLE SIGMOIDOSCOPY  12/29/2011   Procedure: FLEXIBLE SIGMOIDOSCOPY;  Surgeon: Rogene Houston, MD;  Location: [facility];  Service: Endoscopy;  Laterality: N/A;  200-Ann notified pt to be here @ 1:00  . NISSEN FUNDOPLICATION    . YAG LASER APPLICATION Left 3/53/2992   Procedure: YAG LASER APPLICATION;  Surgeon: Williams Che, MD;  Location: [facility];  Service: Ophthalmology;  Laterality: Left;    Family History  Problem Relation Age of Onset  . Diabetes Mother   . Alzheimer's disease Mother   . Cancer Mother   . Heart disease Father   . Cancer Sister   . Heart disease Brother        Also mother, Father, brother and 2 sons  . Cancer Son        testicular cancer   . Aneurysm Son   . Diabetes Son   . Hypertension Brother        also Sister x2  . Colon cancer Neg Hx     Social History   Socioeconomic History  . Marital status: Married    Spouse name: Not on file  . Number of children: 4  . Years of education: Not on file  . Highest education level: Not on file  Occupational History  . Not on file  Tobacco Use  . Smoking status: Former Smoker    Packs/day: 1.00    Years: 55.00    Pack years: 55.00    Quit date: 09/15/2010    Years since quitting: 9.8  . Smokeless tobacco: Never Used  Vaping Use  . Vaping Use: Never used  Substance and Sexual Activity  . Alcohol use: No    Alcohol/week: 0.0 standard drinks  . Drug use: No  . Sexual activity: Not Currently  Other Topics Concern  . Not on file  Social History Narrative  . Not on file   Social Determinants of Health   Financial Resource Strain: Low Risk   . Difficulty of Paying Living Expenses: Not hard at all  Food Insecurity: No Food Insecurity  . Worried About Charity fundraiser in the Last Year: Never true  . Ran Out of Food in the Last Year: Never true  Transportation Needs: No Transportation Needs  . Lack of Transportation (Medical): No  .  Lack of Transportation (Non-Medical): No  Physical Activity: Insufficiently Active  . Days of Exercise per Week: 7 days  . Minutes of Exercise per Session: 20 min  Stress: Stress Concern Present  . Feeling of Stress : Rather much  Social Connections: Moderately Integrated  . Frequency of Communication with Friends and Family: More than three times a week  . Frequency of Social Gatherings with Friends and Family: More than three times a week  . Attends Religious Services: More than 4 times per year  . Active Member of Clubs or Organizations: No  . Attends Archivist Meetings: Never  . Marital Status: Married  Human resources officer Violence: Not At Risk  . Fear of Current or Ex-Partner: No  . Emotionally Abused:  No  . Physically Abused: No  . Sexually Abused: No    Outpatient Medications Prior to Visit  Medication Sig Dispense Refill  . albuterol (VENTOLIN HFA) 108 (90 Base) MCG/ACT inhaler Inhale 1-2 puffs into the lungs every 6 (six) hours as needed for wheezing or shortness of breath. 8 g 5  . atorvastatin (LIPITOR) 20 MG tablet Take 20 mg every evening by mouth.     . carvedilol (COREG) 6.25 MG tablet Take 1 tablet (6.25 mg total) by mouth 2 (two) times daily. 180 tablet 3  . gabapentin (NEURONTIN) 100 MG capsule TAKE 1 CAPSULE BY MOUTH THREE TIMES A DAY. 90 capsule 0  . HYDROcodone-acetaminophen (NORCO) 10-325 MG tablet Take 0.5-2 tablets by mouth every 4 (four) hours as needed (1/2 tablet for mild pain, 1 tablet for moderate pain, 2 tablets for severe pain). 80 tablet 0  . hydroxypropyl methylcellulose (ISOPTO TEARS) 2.5 % ophthalmic solution Place 1 drop into both eyes 3 (three) times daily as needed. For dry eyes.    Marland Kitchen levothyroxine (SYNTHROID) 88 MCG tablet Take 1 tablet (88 mcg total) by mouth daily before breakfast. 90 tablet 1  . LINZESS 145 MCG CAPS capsule TAKE 1 CAPSULE ONCE DAILY BEFORE BREAKFAST. 90 capsule 3  . lisinopril (ZESTRIL) 5 MG tablet Take 5 mg by mouth  daily.    . Multiple Vitamins-Minerals (EYE VITAMINS PO) Take 1 tablet by mouth daily.     Marland Kitchen omeprazole (PRILOSEC) 40 MG capsule TAKE (1) CAPSULE BY MOUTH TWICE DAILY FOR REFLUX. 180 capsule 0  . ondansetron (ZOFRAN) 4 MG tablet TAKE 1 TABLET BY MOUTH THREE TIMES AS NEEDED FOR NAUSEA. 90 tablet 0  . potassium chloride SA (K-DUR) 20 MEQ tablet Take 20 mEq by mouth daily with lunch.     Marland Kitchen amLODipine (NORVASC) 10 MG tablet Take 1 tablet (10 mg total) by mouth daily. 90 tablet 3  . hydrochlorothiazide (HYDRODIURIL) 25 MG tablet Take 25 mg by mouth daily.    . DULoxetine (CYMBALTA) 30 MG capsule Take 1 capsule (30 mg total) by mouth daily. 90 capsule 0   No facility-administered medications prior to visit.    Allergies  Allergen Reactions  . Hydralazine     Breathing  . Paroxetine Itching  . Sulfa Antibiotics Other (See Comments)    Childhood reaction.  . Venlafaxine Itching  . Nalbuphine Rash    ROS Review of Systems  Constitutional: Positive for fatigue. Negative for appetite change, chills and fever.  HENT: Negative for congestion, sinus pressure, sinus pain and sore throat.   Eyes: Negative for pain and discharge.  Respiratory: Negative for cough and shortness of breath.   Cardiovascular: Positive for leg swelling. Negative for chest pain and palpitations.  Gastrointestinal: Negative for abdominal pain, constipation, diarrhea, nausea and vomiting.  Endocrine: Negative for polydipsia and polyuria.  Genitourinary: Negative for dysuria and hematuria.  Musculoskeletal: Negative for neck pain and neck stiffness.  Skin: Negative for rash.  Neurological: Negative for dizziness and weakness.  Psychiatric/Behavioral: Negative for agitation and behavioral problems.      Objective:    Physical Exam Vitals reviewed.  Constitutional:      General: She is not in acute distress.    Appearance: She is not diaphoretic.     Comments: In wheelchair  HENT:     Head: Normocephalic and  atraumatic.     Nose: Nose normal.     Mouth/Throat:     Mouth: Mucous membranes are moist.  Eyes:  General: No scleral icterus.    Extraocular Movements: Extraocular movements intact.  Cardiovascular:     Rate and Rhythm: Normal rate and regular rhythm.     Pulses: Normal pulses.     Heart sounds: Normal heart sounds. No murmur heard.   Pulmonary:     Breath sounds: Normal breath sounds. No wheezing or rales.  Abdominal:     Palpations: Abdomen is soft.     Tenderness: There is no abdominal tenderness.  Musculoskeletal:        General: Swelling (Right knee, no warmth) present.     Cervical back: Neck supple. No tenderness.     Right lower leg: Edema (2+) present.     Left lower leg: Edema (1+) present.  Skin:    General: Skin is warm.     Findings: Bruising (Over right leg, lateral aspect) present.  Neurological:     General: No focal deficit present.     Mental Status: She is alert and oriented to person, place, and time.     Sensory: No sensory deficit.     Motor: No weakness.  Psychiatric:        Mood and Affect: Mood normal.        Behavior: Behavior normal.     BP (!) 158/77 (BP Location: Right Arm, Patient Position: Sitting, Cuff Size: Normal)   Pulse (!) 55   Temp 98.2 F (36.8 C) (Oral)   Resp 16   Ht 5' (1.524 m)   Wt 142 lb 12.8 oz (64.8 kg)   SpO2 99%   BMI 27.89 kg/m  Wt Readings from Last 3 Encounters:  08/03/20 142 lb 12.8 oz (64.8 kg)  06/24/20 141 lb 9.6 oz (64.2 kg)  06/14/20 143 lb 1.9 oz (64.9 kg)     Health Maintenance Due  Topic Date Due  . COVID-19 Vaccine (1) Never done  . TETANUS/TDAP  Never done    There are no preventive care reminders to display for this patient.  Lab Results  Component Value Date   TSH 4.600 (H) 06/14/2020   Lab Results  Component Value Date   WBC 6.3 12/01/2019   HGB 11.5 (L) 12/01/2019   HCT 36.7 12/01/2019   MCV 93.9 12/01/2019   PLT 212 12/01/2019   Lab Results  Component Value Date   NA  141 12/01/2019   K 4.2 12/01/2019   CO2 23 12/01/2019   GLUCOSE 116 (H) 12/01/2019   BUN 32 (H) 12/01/2019   CREATININE 1.82 (H) 12/01/2019   BILITOT 0.4 11/26/2018   ALKPHOS 50 11/26/2018   AST 12 (L) 11/26/2018   ALT 12 11/26/2018   PROT 6.6 11/26/2018   ALBUMIN 3.5 11/26/2018   CALCIUM 8.9 12/01/2019   ANIONGAP 8 12/01/2019   Lab Results  Component Value Date   CHOL 164 09/17/2018   Lab Results  Component Value Date   HDL 44 09/17/2018   Lab Results  Component Value Date   LDLCALC 95 09/17/2018   Lab Results  Component Value Date   TRIG 145 09/17/2018   Lab Results  Component Value Date   CHOLHDL 2.9 07/15/2008   Lab Results  Component Value Date   HGBA1C 5.3 06/14/2020      Assessment & Plan:   Problem List Items Addressed This Visit      Cardiovascular and Mediastinum   Malignant hypertension - Primary    BP Readings from Last 1 Encounters:  08/03/20 (!) 158/77   Uncontrolled with amlodipine 10 mg daily,  Coreg 6.25 mg twice daily, HCTZ 25 mg daily and lisinopril 10 mg daily Decreased amlodipine to 5 mg daily considering ankle edema Switched from HCTZ to chlorthalidone 25 mg daily Counseled for compliance with the medications Advised DASH diet and moderate exercise/walking as tolerated Chronic pain and CKD also contributing to high BP Needs a follow up visit with Nephrologist      Relevant Medications   amLODipine (NORVASC) 5 MG tablet   chlorthalidone (HYGROTON) 25 MG tablet     Genitourinary   CKD (chronic kidney disease) stage 4, GFR 15-29 ml/min (HCC)    Follows up with nephrologist, needs a follow up visit Follows up with hematooncologist for evaluation of elevated light chain levels Advised to follow renal diet as advised by nephrologist and dietitian Avoid nephrotoxic agents including NSAIDs Was on HCTZ, on Chlorthalidone now       Other Visit Diagnoses    Fall, initial encounter     Mechanical fall, no symptoms of  syncope Bruising noted over right leg with mild knee swelling Will check right knee x-ray Has Norco as needed   Relevant Orders   DG Knee Complete 4 Views Right   Leg swelling     Chronic, could be worse in the setting of recent fall Amlodipine could be contributing to ankle edema as well, decreased dose Switched from HCTZ to chlorthalidone 25 mg daily Leg elevation Compression socks   Relevant Medications   chlorthalidone (HYGROTON) 25 MG tablet    Contacted Nephrology office for follow up appointment, scheduled for next Monday, 5/16.  Meds ordered this encounter  Medications  . amLODipine (NORVASC) 5 MG tablet    Sig: Take 1 tablet (5 mg total) by mouth daily.    Dispense:  30 tablet    Refill:  3  . chlorthalidone (HYGROTON) 25 MG tablet    Sig: Take 1 tablet (25 mg total) by mouth daily.    Dispense:  30 tablet    Refill:  2    Follow-up: Return if symptoms worsen or fail to improve.    Lindell Spar, MD

## 2020-08-03 NOTE — Assessment & Plan Note (Signed)
BP Readings from Last 1 Encounters:  08/03/20 (!) 158/77   Uncontrolled with amlodipine 10 mg daily, Coreg 6.25 mg twice daily, HCTZ 25 mg daily and lisinopril 10 mg daily Decreased amlodipine to 5 mg daily considering ankle edema Switched from HCTZ to chlorthalidone 25 mg daily Counseled for compliance with the medications Advised DASH diet and moderate exercise/walking as tolerated Chronic pain and CKD also contributing to high BP Needs a follow up visit with Nephrologist

## 2020-08-09 DIAGNOSIS — I129 Hypertensive chronic kidney disease with stage 1 through stage 4 chronic kidney disease, or unspecified chronic kidney disease: Secondary | ICD-10-CM | POA: Diagnosis not present

## 2020-08-09 DIAGNOSIS — R809 Proteinuria, unspecified: Secondary | ICD-10-CM | POA: Diagnosis not present

## 2020-08-09 DIAGNOSIS — N1832 Chronic kidney disease, stage 3b: Secondary | ICD-10-CM | POA: Diagnosis not present

## 2020-08-20 ENCOUNTER — Other Ambulatory Visit: Payer: Self-pay | Admitting: Internal Medicine

## 2020-08-20 ENCOUNTER — Other Ambulatory Visit: Payer: Self-pay | Admitting: Student

## 2020-08-21 ENCOUNTER — Encounter (HOSPITAL_COMMUNITY): Payer: Self-pay

## 2020-08-21 ENCOUNTER — Emergency Department (HOSPITAL_COMMUNITY)
Admission: EM | Admit: 2020-08-21 | Discharge: 2020-08-21 | Disposition: A | Discharge status: Home / Self Care | Payer: Medicare Other | Attending: Emergency Medicine | Admitting: Emergency Medicine

## 2020-08-21 ENCOUNTER — Other Ambulatory Visit: Payer: Self-pay

## 2020-08-21 DIAGNOSIS — E876 Hypokalemia: Secondary | ICD-10-CM

## 2020-08-21 DIAGNOSIS — E039 Hypothyroidism, unspecified: Secondary | ICD-10-CM | POA: Diagnosis not present

## 2020-08-21 DIAGNOSIS — Z87891 Personal history of nicotine dependence: Secondary | ICD-10-CM | POA: Insufficient documentation

## 2020-08-21 DIAGNOSIS — J449 Chronic obstructive pulmonary disease, unspecified: Secondary | ICD-10-CM | POA: Diagnosis not present

## 2020-08-21 DIAGNOSIS — K219 Gastro-esophageal reflux disease without esophagitis: Secondary | ICD-10-CM | POA: Insufficient documentation

## 2020-08-21 DIAGNOSIS — K2100 Gastro-esophageal reflux disease with esophagitis, without bleeding: Secondary | ICD-10-CM

## 2020-08-21 DIAGNOSIS — Z79899 Other long term (current) drug therapy: Secondary | ICD-10-CM | POA: Insufficient documentation

## 2020-08-21 DIAGNOSIS — I129 Hypertensive chronic kidney disease with stage 1 through stage 4 chronic kidney disease, or unspecified chronic kidney disease: Secondary | ICD-10-CM | POA: Diagnosis not present

## 2020-08-21 DIAGNOSIS — N184 Chronic kidney disease, stage 4 (severe): Secondary | ICD-10-CM | POA: Diagnosis not present

## 2020-08-21 DIAGNOSIS — R109 Unspecified abdominal pain: Secondary | ICD-10-CM | POA: Diagnosis present

## 2020-08-21 DIAGNOSIS — J45909 Unspecified asthma, uncomplicated: Secondary | ICD-10-CM | POA: Diagnosis not present

## 2020-08-21 LAB — COMPREHENSIVE METABOLIC PANEL
ALT: 13 U/L (ref 0–44)
AST: 18 U/L (ref 15–41)
Albumin: 4.2 g/dL (ref 3.5–5.0)
Alkaline Phosphatase: 70 U/L (ref 38–126)
Anion gap: 13 (ref 5–15)
BUN: 66 mg/dL — ABNORMAL HIGH (ref 8–23)
CO2: 30 mmol/L (ref 22–32)
Calcium: 9.6 mg/dL (ref 8.9–10.3)
Chloride: 93 mmol/L — ABNORMAL LOW (ref 98–111)
Creatinine, Ser: 3.14 mg/dL — ABNORMAL HIGH (ref 0.44–1.00)
GFR, Estimated: 14 mL/min — ABNORMAL LOW (ref >60)
Glucose, Bld: 121 mg/dL — ABNORMAL HIGH (ref 70–99)
Potassium: 2.9 mmol/L — ABNORMAL LOW (ref 3.5–5.1)
Sodium: 136 mmol/L (ref 135–145)
Total Bilirubin: 0.6 mg/dL (ref 0.3–1.2)
Total Protein: 7.8 g/dL (ref 6.5–8.1)

## 2020-08-21 LAB — CBC
HCT: 36 % (ref 36.0–46.0)
Hemoglobin: 12.1 g/dL (ref 12.0–15.0)
MCH: 29.7 pg (ref 26.0–34.0)
MCHC: 33.6 g/dL (ref 30.0–36.0)
MCV: 88.2 fL (ref 80.0–100.0)
Platelets: 312 10*3/uL (ref 150–400)
RBC: 4.08 MIL/uL (ref 3.87–5.11)
RDW: 13 % (ref 11.5–15.5)
WBC: 7 10*3/uL (ref 4.0–10.5)
nRBC: 0 % (ref 0.0–0.2)

## 2020-08-21 LAB — LIPASE, BLOOD: Lipase: 35 U/L (ref 11–51)

## 2020-08-21 MED ORDER — LIDOCAINE VISCOUS HCL 2 % MT SOLN
15.0000 mL | Freq: Once | OROMUCOSAL | Status: AC
  Administered 2020-08-21: 15 mL via ORAL
  Filled 2020-08-21: qty 15

## 2020-08-21 MED ORDER — ALUM & MAG HYDROXIDE-SIMETH 200-200-20 MG/5ML PO SUSP
30.0000 mL | Freq: Once | ORAL | Status: AC
  Administered 2020-08-21: 30 mL via ORAL
  Filled 2020-08-21: qty 30

## 2020-08-21 MED ORDER — POTASSIUM CHLORIDE CRYS ER 20 MEQ PO TBCR
40.0000 meq | EXTENDED_RELEASE_TABLET | Freq: Once | ORAL | Status: AC
  Administered 2020-08-21: 40 meq via ORAL
  Filled 2020-08-21: qty 2

## 2020-08-21 MED ORDER — SUCRALFATE 1 G PO TABS: 1.0000 g | ORAL_TABLET | Freq: Three times a day (TID) | ORAL | 1 refills | Status: DC

## 2020-08-21 MED ORDER — FAMOTIDINE 20 MG PO TABS: 20.0000 mg | ORAL_TABLET | Freq: Two times a day (BID) | ORAL | 0 refills | Status: DC

## 2020-08-21 MED ORDER — FAMOTIDINE 20 MG PO TABS
20.0000 mg | ORAL_TABLET | Freq: Once | ORAL | Status: AC
  Administered 2020-08-21: 20 mg via ORAL
  Filled 2020-08-21: qty 1

## 2020-08-21 MED ORDER — PANTOPRAZOLE SODIUM 40 MG PO TBEC
40.0000 mg | DELAYED_RELEASE_TABLET | Freq: Once | ORAL | Status: AC
  Administered 2020-08-21: 40 mg via ORAL
  Filled 2020-08-21: qty 1

## 2020-08-21 MED ORDER — PANTOPRAZOLE SODIUM 40 MG PO TBEC: 40.0000 mg | DELAYED_RELEASE_TABLET | Freq: Two times a day (BID) | ORAL | 0 refills | Status: DC
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
## 2020-08-21 NOTE — Discharge Instructions (Signed)
Avoid over the counter medicines such as motrin, ibuprofen, advil, aleve / naprosyn or aspirin - no alcohol -   Pepcid twice daily for 2 weeks Pantoprazole every 12 hours for the next month Carafate with meals and before bed  ER for worsening symptoms  See Dr. Gala Romney in the next week or so.  You will also need your potassium rechecked by your family doctor in 2 weeks.

## 2020-08-21 NOTE — ED Triage Notes (Signed)
Pt to er, pt states that she is here for acid reflux, states that it feel like a volcano in there, states that she has had it for a long time, states that the pain is worse today.

## 2020-08-30 ENCOUNTER — Other Ambulatory Visit: Payer: Self-pay | Admitting: Internal Medicine

## 2020-08-31 ENCOUNTER — Other Ambulatory Visit: Payer: Self-pay

## 2020-08-31 ENCOUNTER — Encounter: Payer: Self-pay | Admitting: Nurse Practitioner

## 2020-08-31 ENCOUNTER — Ambulatory Visit (INDEPENDENT_AMBULATORY_CARE_PROVIDER_SITE_OTHER): Payer: Medicare Other | Admitting: Nurse Practitioner

## 2020-08-31 VITALS — BP 152/77 | HR 65 | Temp 98.6°F | Resp 20 | Ht 60.0 in | Wt 135.0 lb

## 2020-08-31 DIAGNOSIS — E876 Hypokalemia: Secondary | ICD-10-CM | POA: Diagnosis not present

## 2020-08-31 DIAGNOSIS — K219 Gastro-esophageal reflux disease without esophagitis: Secondary | ICD-10-CM | POA: Diagnosis not present

## 2020-08-31 DIAGNOSIS — R110 Nausea: Secondary | ICD-10-CM | POA: Diagnosis not present

## 2020-08-31 MED ORDER — KETOROLAC TROMETHAMINE 60 MG/2ML IM SOLN
60.0000 mg | Freq: Once | INTRAMUSCULAR | Status: AC
  Administered 2020-08-31: 60 mg via INTRAMUSCULAR
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
## 2020-08-31 NOTE — Addendum Note (Signed)
Addended by: Lonn Georgia on: 08/31/2020 04:50 PM   Modules accepted: Orders

## 2020-08-31 NOTE — Assessment & Plan Note (Signed)
-  has pain and nausea related to gastric reflux -IM toradol and phenergan today -Rx. zofran -urgent referral to GI since she is in so much pain

## 2020-09-01 ENCOUNTER — Encounter: Payer: Self-pay | Admitting: *Deleted

## 2020-09-01 LAB — BASIC METABOLIC PANEL
BUN/Creatinine Ratio: 16 (ref 12–28)
BUN: 60 mg/dL — ABNORMAL HIGH (ref 8–27)
CO2: 24 mmol/L (ref 20–29)
Calcium: 10.3 mg/dL (ref 8.7–10.3)
Chloride: 96 mmol/L (ref 96–106)
Creatinine, Ser: 3.82 mg/dL — ABNORMAL HIGH (ref 0.57–1.00)
Glucose: 111 mg/dL — ABNORMAL HIGH (ref 65–99)
Potassium: 3.9 mmol/L (ref 3.5–5.2)
Sodium: 141 mmol/L (ref 134–144)
eGFR: 11 mL/min/{1.73_m2} — ABNORMAL LOW (ref >59)

## 2020-09-01 LAB — CBC WITH DIFFERENTIAL/PLATELET
Basophils Absolute: 0.1 10*3/uL (ref 0.0–0.2)
Basos: 1 %
EOS (ABSOLUTE): 0.2 10*3/uL (ref 0.0–0.4)
Eos: 3 %
Hematocrit: 36.3 % (ref 34.0–46.6)
Hemoglobin: 12 g/dL (ref 11.1–15.9)
Immature Grans (Abs): 0 10*3/uL (ref 0.0–0.1)
Immature Granulocytes: 0 %
Lymphocytes Absolute: 3.4 10*3/uL — ABNORMAL HIGH (ref 0.7–3.1)
Lymphs: 45 %
MCH: 28.8 pg (ref 26.6–33.0)
MCHC: 33.1 g/dL (ref 31.5–35.7)
MCV: 87 fL (ref 79–97)
Monocytes Absolute: 0.8 10*3/uL (ref 0.1–0.9)
Monocytes: 10 %
Neutrophils Absolute: 3.2 10*3/uL (ref 1.4–7.0)
Neutrophils: 41 %
Platelets: 286 10*3/uL (ref 150–450)
RBC: 4.16 x10E6/uL (ref 3.77–5.28)
RDW: 13.1 % (ref 11.7–15.4)
WBC: 7.6 10*3/uL (ref 3.4–10.8)

## 2020-09-01 NOTE — Progress Notes (Signed)
Her labs are stable, so no obvious cause of her reflux on labs.

## 2020-09-02 IMAGING — CT CT ABDOMEN AND PELVIS WITHOUT CONTRAST
2 of 4 series · 15 of 46 positions shown, 17 images · non-contrast
Comparison: CT dated June 27, 2016.

CLINICAL DATA: Right lower quadrant pain times 3-5 months with
nausea and constipation. History of uterine cancer. Decreased renal
function.

EXAM:
CT ABDOMEN AND PELVIS WITHOUT CONTRAST
TECHNIQUE: Multidetector CT imaging of the abdomen and pelvis was performed
following the standard protocol without IV contrast.

[Series 2: axial st · axial · 0.70mm/px · z∈[-540,-135]mm · 12 of 93 slices shown, 14 images]
[im 6/93  soft-tissue]
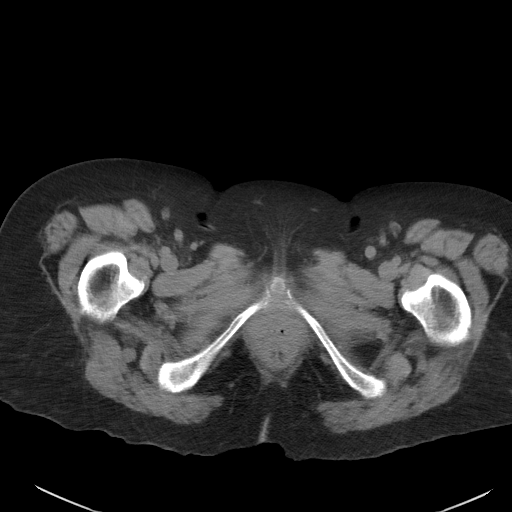
[im 6/93  bone]
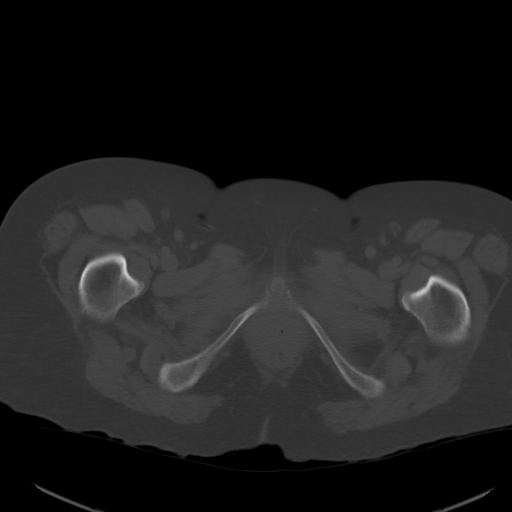
[im 17/93  soft-tissue]
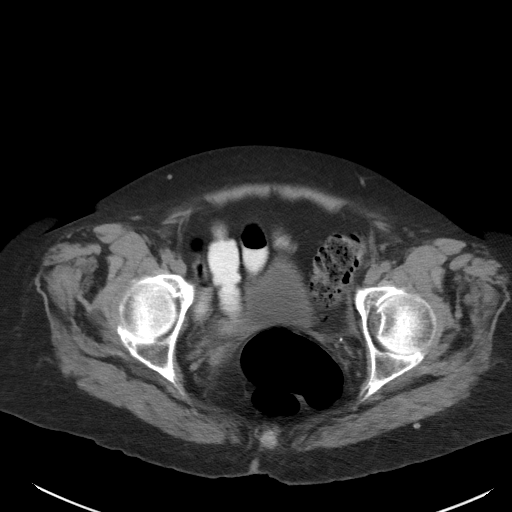
[im 22/93  soft-tissue]
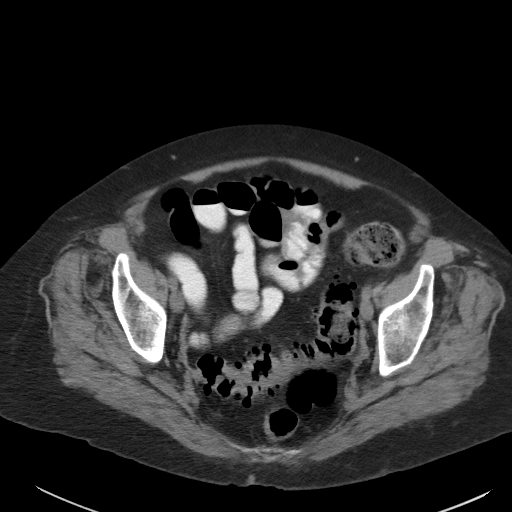
[im 28/93  soft-tissue]
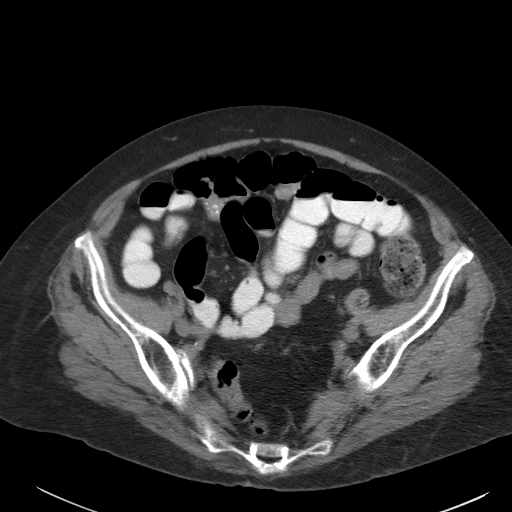
[im 38/93  soft-tissue]
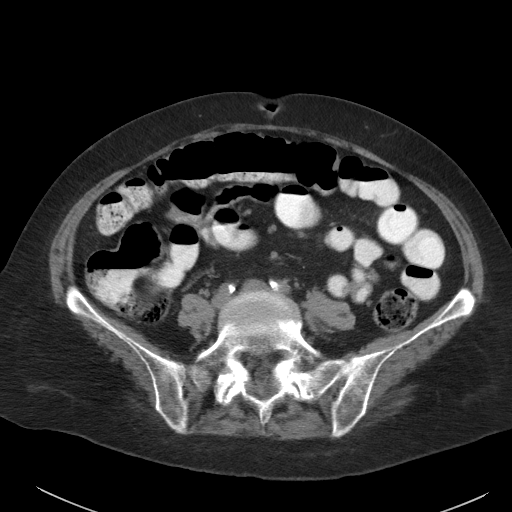
[im 44/93  soft-tissue]
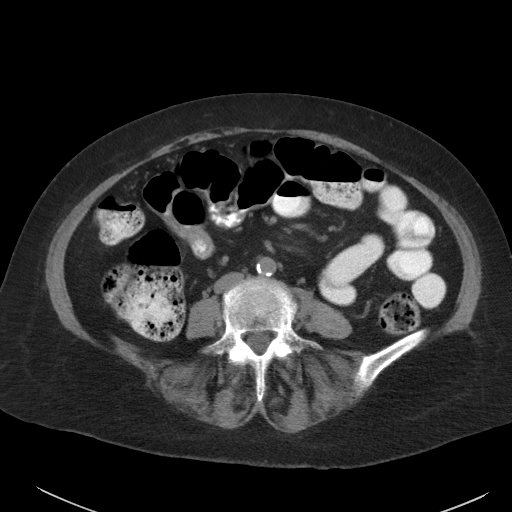
[im 49/93  soft-tissue]
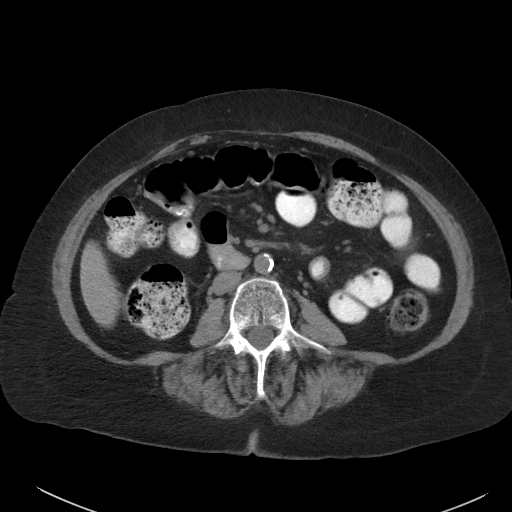
[im 60/93  soft-tissue]
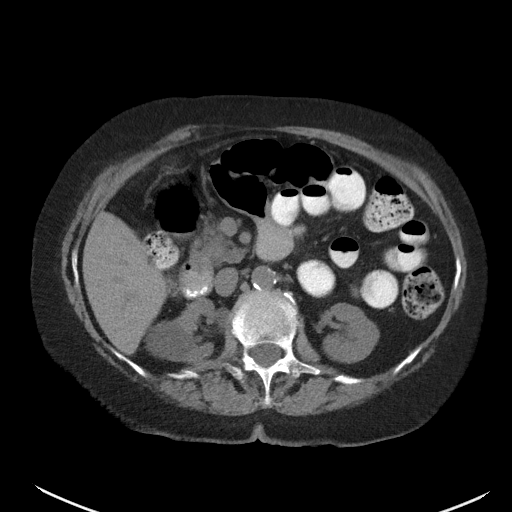
[im 65/93  soft-tissue]
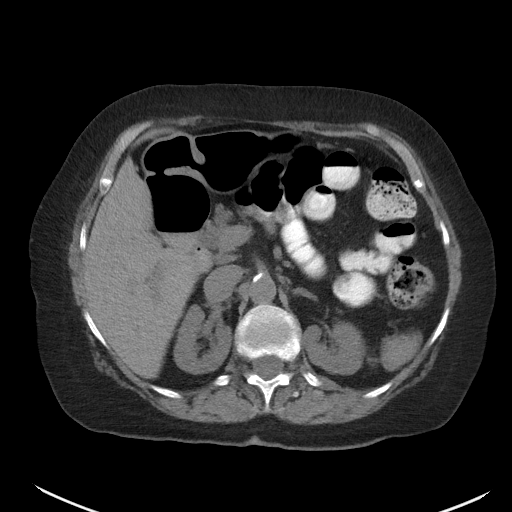
[im 65/93  bone]
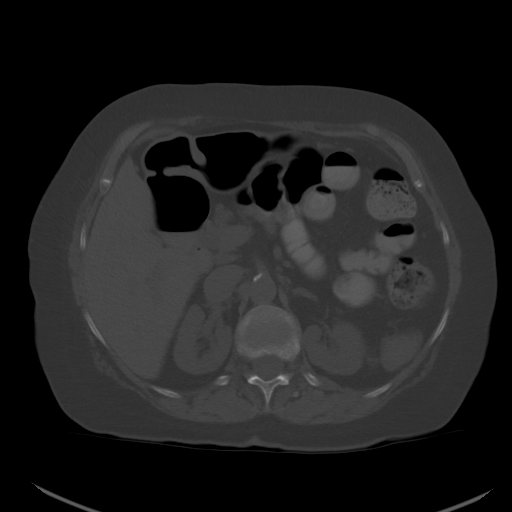
[im 71/93  soft-tissue]
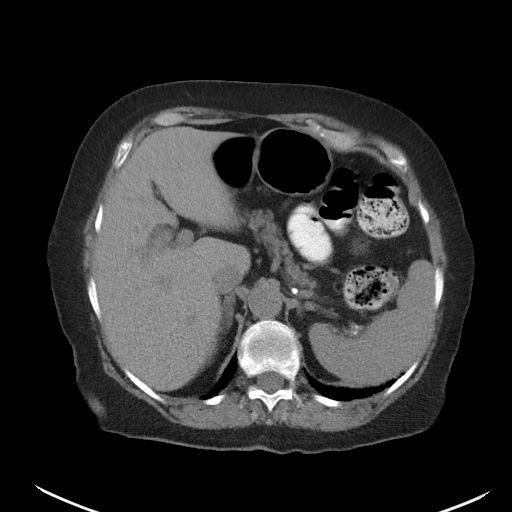
[im 82/93  soft-tissue]
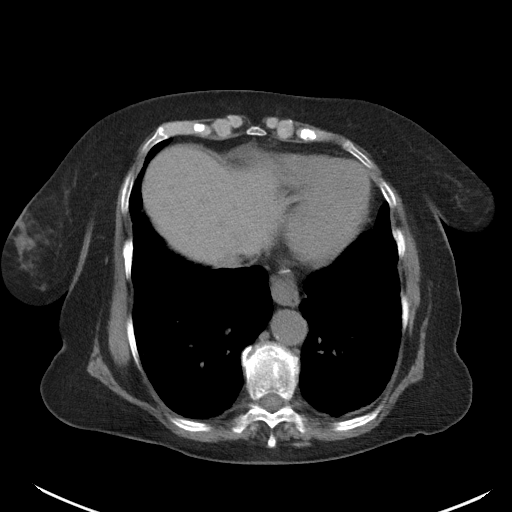
[im 87/93  soft-tissue]
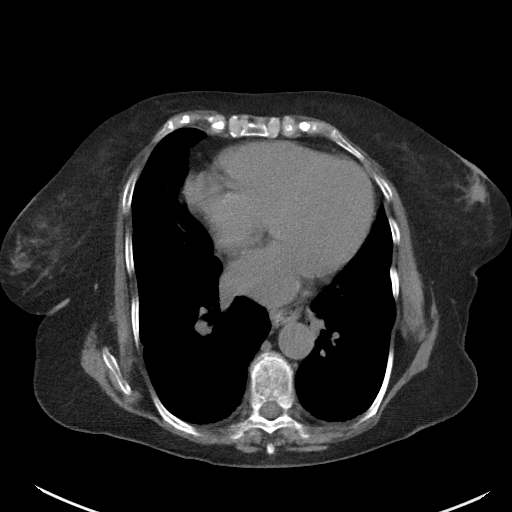

[Series 5: coronal st · coronal · 0.64mm/px · 3 of 99 slices shown]
[im 33/99  soft-tissue]
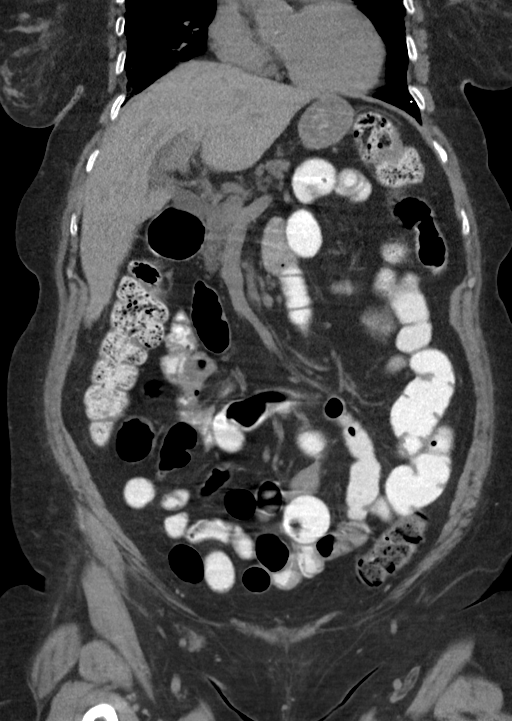
[im 44/99  soft-tissue]
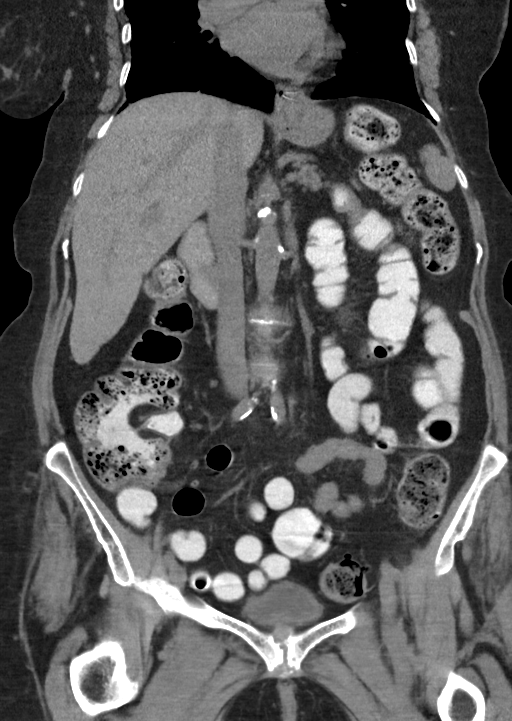
[im 55/99  soft-tissue]
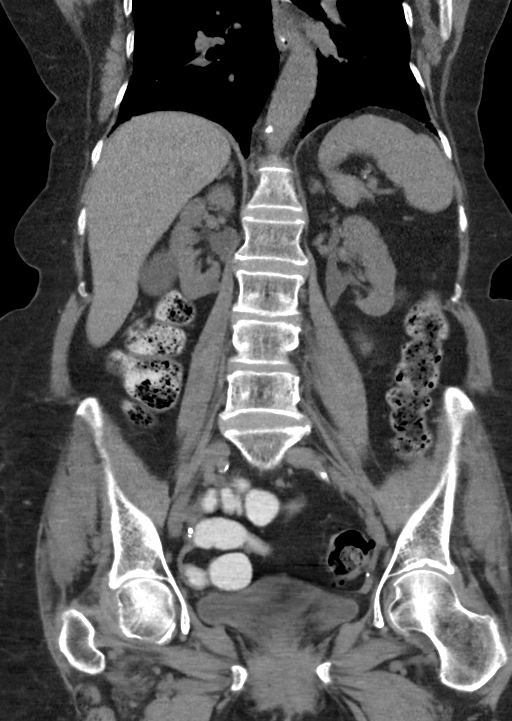

[15 of 46 positions shown; findings below may reference images not displayed]

FINDINGS: Lower chest: There are chronic changes at the lung bases
bilaterally.The heart is enlarged.

Hepatobiliary: The liver is normal. Status post
cholecystectomy.There is mild extrahepatic biliary ductal dilatation
which is similar to prior studies.

Pancreas: Normal contours without ductal dilatation. No
peripancreatic fluid collection.

Spleen: No splenic laceration or hematoma.

Adrenals/Urinary Tract:

--Adrenal glands: No adrenal hemorrhage.

--Right kidney/ureter: No hydronephrosis or perinephric hematoma.

--Left kidney/ureter: No hydronephrosis or perinephric hematoma.

--Urinary bladder: Unremarkable.

Stomach/Bowel:

--Stomach/Duodenum: There is a small hiatal hernia. Surgical clips
are noted at the GE junction.

--Small bowel: No dilatation or inflammation.

--Colon: Rectosigmoid diverticulosis without acute inflammation.
There is an above average amount of stool throughout the colon.

--Appendix: Normal.

Vascular/Lymphatic: Atherosclerotic calcification is present within
the non-aneurysmal abdominal aorta, without hemodynamically
significant stenosis.

--No retroperitoneal lymphadenopathy.

--No mesenteric lymphadenopathy.

--No pelvic or inguinal lymphadenopathy.

Reproductive: Status post hysterectomy. No adnexal mass.

Other: No ascites or free air. The abdominal wall is normal.

Musculoskeletal. Multilevel degenerative disc disease and facet
arthrosis. No bony spinal canal stenosis.
IMPRESSION: 1. No acute abnormality detected. No findings to explain the
patient's right lower quadrant abdominal pain.
2. Severe sigmoid diverticulosis without CT evidence of
diverticulitis.
3. Moderate amount of stool throughout the colon.

Aortic Atherosclerosis ([id number]).

## 2020-09-06 ENCOUNTER — Telehealth: Payer: Self-pay | Admitting: Internal Medicine

## 2020-09-06 ENCOUNTER — Other Ambulatory Visit: Payer: Self-pay

## 2020-09-06 ENCOUNTER — Telehealth: Payer: Self-pay

## 2020-09-06 ENCOUNTER — Ambulatory Visit (INDEPENDENT_AMBULATORY_CARE_PROVIDER_SITE_OTHER): Payer: Medicare Other

## 2020-09-06 DIAGNOSIS — Z0001 Encounter for general adult medical examination with abnormal findings: Secondary | ICD-10-CM

## 2020-09-06 DIAGNOSIS — Z780 Asymptomatic menopausal state: Secondary | ICD-10-CM

## 2020-09-06 DIAGNOSIS — Z0000 Encounter for general adult medical examination without abnormal findings: Secondary | ICD-10-CM

## 2020-09-06 NOTE — Telephone Encounter (Signed)
Called patient and confirmed appointment.

## 2020-09-06 NOTE — Progress Notes (Signed)
Subjective:   [name] is a 85 y.o. female who presents for an Initial Medicare Annual Wellness Visit.  I connected with [name] today by telephone and verified that I am speaking with the correct person using two identifiers. Location patient: home Location provider: work Persons participating in the virtual visit: patient, provider.   I discussed the limitations, risks, security and privacy concerns of performing an evaluation and management service by telephone and the availability of in person appointments. I also discussed with the patient that there may be a patient responsible charge related to this service. The patient expressed understanding and verbally consented to this telephonic visit.    Interactive audio and video telecommunications were attempted between this provider and patient, however failed, due to patient having technical difficulties OR patient did not have access to video capability.  We continued and completed visit with audio only.      Review of Systems    N/A        Objective:    There were no vitals filed for this visit. There is no height or weight on file to calculate BMI.  Advanced Directives 08/21/2020 03/09/2020 12/01/2019 08/30/2019 07/04/2019 06/07/2019 05/24/2019  Does Patient Have a Medical Advance Directive? _0  No No  Copy of Healthcare Power of Attorney in Chart? - - - - - - -  Would patient like information on creating a medical advance directive? - No - Patient declined No - Patient declined No - Patient declined - No - Patient declined -  Pre-existing out of facility DNR order (yellow form or pink MOST form) - - - - - - -    Current Medications (verified) Outpatient Encounter Medications as of 09/06/2020  Medication Sig   albuterol (VENTOLIN HFA) 108 (90 Base) MCG/ACT inhaler Inhale 1-2 puffs into the lungs every 6 (six) hours as needed for wheezing or shortness of breath.   amLODipine (NORVASC) 5 MG tablet Take 1 tablet  (5 mg total) by mouth daily.   atorvastatin (LIPITOR) 20 MG tablet Take 20 mg every evening by mouth.    carvedilol (COREG) 6.25 MG tablet TAKE (1) TABLET BY MOUTH TWICE DAILY.   chlorthalidone (HYGROTON) 25 MG tablet Take 1 tablet (25 mg total) by mouth daily.   DULoxetine (CYMBALTA) 30 MG capsule Take 1 capsule (30 mg total) by mouth daily.   famotidine (PEPCID) 20 MG tablet Take 1 tablet (20 mg total) by mouth 2 (two) times daily.   gabapentin (NEURONTIN) 100 MG capsule TAKE 1 CAPSULE BY MOUTH THREE TIMES A DAY.   HYDROcodone-acetaminophen (NORCO) 10-325 MG tablet Take 0.5-2 tablets by mouth every 4 (four) hours as needed (1/2 tablet for mild pain, 1 tablet for moderate pain, 2 tablets for severe pain).   hydroxypropyl methylcellulose (ISOPTO TEARS) 2.5 % ophthalmic solution Place 1 drop into both eyes 3 (three) times daily as needed. For dry eyes.   levothyroxine (SYNTHROID) 88 MCG tablet Take 1 tablet (88 mcg total) by mouth daily before breakfast.   LINZESS 145 MCG CAPS capsule TAKE 1 CAPSULE ONCE DAILY BEFORE BREAKFAST.   lisinopril (ZESTRIL) 5 MG tablet Take 5 mg by mouth daily.   Multiple Vitamins-Minerals (EYE VITAMINS PO) Take 1 tablet by mouth daily.    omeprazole (PRILOSEC) 40 MG capsule TAKE (1) CAPSULE BY MOUTH TWICE DAILY FOR REFLUX.   ondansetron (ZOFRAN) 4 MG tablet TAKE 1 TABLET BY MOUTH THREE TIMES AS NEEDED FOR NAUSEA.   pantoprazole (PROTONIX) 40 MG  tablet Take 1 tablet (40 mg total) by mouth 2 (two) times daily.   permethrin (ELIMITE) 5 % cream WASH HAIR WITH REGULAR SHAMPOO, THEN RINSE AND TOWEL DRY HAIR. SATURATE HAIR AND SCALP ESPECIALLY BEHIND EARS, LEAVE IN FOR 10 MINTUES, THEN RINSE WITH WATER.   potassium chloride SA (K-DUR) 20 MEQ tablet Take 20 mEq by mouth daily with lunch.    sucralfate (CARAFATE) 1 g tablet Take 1 tablet (1 g total) by mouth 4 (four) times daily -  with meals and at bedtime.   No facility-administered encounter medications on file as of  09/06/2020.    Allergies (verified) Hydralazine, Paroxetine, Sulfa antibiotics, Venlafaxine, and Nalbuphine   History: Past Medical History:  Diagnosis Date   Anxiety disorder, unspecified    Arteriosclerotic cardiovascular disease (ASCVD) 2006   Nonobstructive; < 50% lesions on cath 2002; negative stress nuclear study in 10/2004   Asthma    Chest pain, unspecified    Chronic kidney disease, unspecified    Chronic obstructive pulmonary disease, unspecified (HCC)    Chronic pelvic pain in female    Colitis due to Clostridium difficile 1994   1994   COPD (chronic obstructive pulmonary disease) ([place])    Depression with anxiety    Diarrhea, unspecified    Disorder of thyroid, unspecified    Gastro-esophageal reflux disease without esophagitis    Gastroesophageal reflux disease    Hyperlipidemia    Hyperlipidemia, unspecified    Hypertension    Hypertensive chronic kidney disease with stage 1 through stage 4 chronic kidney disease, or unspecified chronic kidney disease    Hyperthyroidism    Hypothyroidism    Hypothyroidism, unspecified    Kidney disease, chronic, stage III (moderate, EGFR 30-59 ml/min) (HCC)    Lower abdominal pain, unspecified    Major depressive disorder, single episode, severe without psychotic features ([place])    Major depressive disorder, single episode, unspecified    Nausea    Other postherpetic nervous system involvement    Pelvic and perineal pain    Rash and other nonspecific skin eruption    S/P colonoscopy 2007   few diverticula, otherwise nl   S/P colonoscopy 12/13/10   rectal, cecal polyp, left-sided diverticulosis, hyperplastic. ?anal fissure? treated empirically   S/P endoscopy 2009   linear esophageal erosions   S/P endoscopy 05/10/10   Question island of salmon-colored epithelium in distal esophagus ; no Barrett's.    Shingles    Tobacco abuse, in remission    55-pack-year consumption; discontinued 08/2010   Unspecified asthma, uncomplicated     Unspecified osteoarthritis, unspecified site    Urinary tract infection, site not specified    Past Surgical History:  Procedure Laterality Date   ABDOMINAL HYSTERECTOMY     BREAST LUMPECTOMY     CATARACT EXTRACTION     CHOLECYSTECTOMY  1962   COLONOSCOPY  12/13/2010   Left-sided diverticulosis.  Cecal polyp, status post hot snare polypectomy/ Diminutive rectal polyp, status post cold biopsy removal tender/painful anal canal, ? occult anal fissure- Not visualized   COLONOSCOPY N/A 10/14/2015   Diverticulosis   ESOPHAGOGASTRODUODENOSCOPY  05/10/10   benign mucosa with mild chronic inflammation.   ESOPHAGOGASTRODUODENOSCOPY (EGD) WITH PROPOFOL N/A 02/13/2019   Procedure: ESOPHAGOGASTRODUODENOSCOPY (EGD) WITH PROPOFOL;  Surgeon: Daneil Dolin, MD;  Location: [facility];  Service: Endoscopy;  Laterality: N/A;  3:00pm   FLEXIBLE SIGMOIDOSCOPY  12/29/2011   Procedure: FLEXIBLE SIGMOIDOSCOPY;  Surgeon: Rogene Houston, MD;  Location: [facility];  Service: Endoscopy;  Laterality:  N/A;  200-Ann notified pt to be here @ 7:41   NISSEN FUNDOPLICATION     YAG LASER APPLICATION Left 6/38/4536   Procedure: YAG LASER APPLICATION;  Surgeon: Williams Che, MD;  Location: [facility];  Service: Ophthalmology;  Laterality: Left;   Family History  Problem Relation Age of Onset   Diabetes Mother    Alzheimer's disease Mother    Cancer Mother    Heart disease Father    Cancer Sister    Heart disease Brother        Also mother, Father, brother and 2 sons   Cancer Son        testicular cancer    Aneurysm Son    Diabetes Son    Hypertension Brother        also Sister x2   Colon cancer Neg Hx    Social History   Socioeconomic History   Marital status: Married    Spouse name: Not on file   Number of children: 4   Years of education: Not on file   Highest education level: Not on file  Occupational History   Not on file  Tobacco Use   Smoking status: Former    Packs/day: 1.00    Years:  55.00    Pack years: 55.00    Types: Cigarettes    Quit date: 09/15/2010    Years since quitting: 9.9   Smokeless tobacco: Never  Vaping Use   Vaping Use: Never used  Substance and Sexual Activity   Alcohol use: No    Alcohol/week: 0.0 standard drinks   Drug use: No   Sexual activity: Not Currently  Other Topics Concern   Not on file  Social History Narrative   Not on file   Social Determinants of Health   Financial Resource Strain: Low Risk    Difficulty of Paying Living Expenses: Not hard at all  Food Insecurity: No Food Insecurity   Worried About Charity fundraiser in the Last Year: Never true   [place] in the Last Year: Never true  Transportation Needs: No Transportation Needs   Lack of Transportation (Medical): No   Lack of Transportation (Non-Medical): No  Physical Activity: Insufficiently Active   Days of Exercise per Week: 7 days   Minutes of Exercise per Session: 20 min  Stress: Stress Concern Present   Feeling of Stress : Rather much  Social Connections: Moderately Integrated   Frequency of Communication with Friends and Family: More than three times a week   Frequency of Social Gatherings with Friends and Family: More than three times a week   Attends Religious Services: More than 4 times per year   Active Member of [employer] or Organizations: No   Attends Archivist Meetings: Never   Marital Status: Married    Tobacco Counseling Counseling given: Not Answered   Clinical Intake:                 Diabetic?No          Activities of Daily Living In your present state of health, do you have any difficulty performing the following activities: 12/22/2019  Hearing? N  Vision? N  Difficulty concentrating or making decisions? Y  Walking or climbing stairs? Y  Dressing or bathing? N  Doing errands, shopping? Y  Some recent data might be hidden    Patient Care Team: Lindell Spar, MD as PCP - General (Internal Medicine) Fay Records, MD as PCP -  Cardiology (Cardiology) Gala Romney Cristopher Estimable, MD (Gastroenterology) Lattie Haw Cristopher Estimable, MD (Inactive) (Cardiology) Rexene Agent, MD as Attending Physician (Nephrology)  Indicate any recent Medical Services you may have received from other than [facility] providers in the past year (date may be approximate).     Assessment:   This is a routine wellness examination for Kla.  Hearing/Vision screen No results found.  Dietary issues and exercise activities discussed:     Goals Addressed   None    Depression Screen PHQ 2/9 Scores 08/31/2020 08/31/2020 08/03/2020 06/14/2020 03/15/2020 02/02/2020 12/22/2019  PHQ - 2 Score 0 0 0 3 0 0 4  PHQ- 9 Score - - - 7 - - 8    Fall Risk Fall Risk  08/31/2020 08/03/2020 06/14/2020 03/15/2020 02/02/2020  Falls in the past year? 1 1 0 0 0  Number falls in past yr: 0 1 0 0 0  Injury with Fall? 1 1 0 0 0  Risk for fall due to : Impaired balance/gait;Impaired mobility History of fall(s);Impaired balance/gait;Impaired mobility No Fall Risks No Fall Risks No Fall Risks  Follow up Falls evaluation completed Falls evaluation completed;Education provided;Falls prevention discussed;Follow up appointment Falls evaluation completed Falls evaluation completed Falls evaluation completed    [place]:  Any stairs in or around the home? No  If so, are there any without handrails? No  Home free of loose throw rugs in walkways, pet beds, electrical cords, etc? Yes  Adequate lighting in your home to reduce risk of falls? Yes   ASSISTIVE DEVICES UTILIZED TO PREVENT FALLS:  Life alert? No  Use of a cane, walker or w/c? Yes  Grab bars in the bathroom? No  Shower chair or bench in shower? No  Elevated toilet seat or a handicapped toilet? Yes    Cognitive Function:  Normal cognitive status assessed by direct observation by this Nurse Health Advisor. No abnormalities found.        Immunizations Immunization History   Administered Date(s) Administered   Fluad Quad(high Dose 65+) 02/02/2020   Influenza Inj Mdck Quad Pf 12/30/2015   Influenza, High Dose Seasonal PF 02/08/2017, 12/10/2017   Influenza-Unspecified 12/15/2010, 03/01/2012, 12/06/2012, 12/22/2013, 03/01/2015   Pneumococcal Conjugate-13 12/22/2013   Pneumococcal Polysaccharide-23 11/25/2014, 03/14/2017    TDAP status: Due, Education has been provided regarding the importance of this vaccine. Advised may receive this vaccine at local pharmacy or Health Dept. Aware to provide a copy of the vaccination record if obtained from local pharmacy or Health Dept. Verbalized acceptance and understanding.  Flu Vaccine status: Up to date  Pneumococcal vaccine status: Up to date  Covid-19 vaccine status: Declined, Education has been provided regarding the importance of this vaccine but patient still declined. Advised may receive this vaccine at local pharmacy or Health Dept.or vaccine clinic. Aware to provide a copy of the vaccination record if obtained from local pharmacy or Health Dept. Verbalized acceptance and understanding.  Qualifies for Shingles Vaccine? Yes   Zostavax completed No   Shingrix Completed?: No.    Education has been provided regarding the importance of this vaccine. Patient has been advised to call insurance company to determine out of pocket expense if they have not yet received this vaccine. Advised may also receive vaccine at local pharmacy or Health Dept. Verbalized acceptance and understanding.  Screening Tests Health Maintenance  Topic Date Due   COVID-19 Vaccine (1) Never done   TETANUS/TDAP  Never done   Zoster Vaccines- Shingrix (1 of 2)  Never done   INFLUENZA VACCINE  10/25/2020   DEXA SCAN  Completed   PNA vac Low Risk Adult  Completed   HPV VACCINES  Aged Out    Health Maintenance  Health Maintenance Due  Topic Date Due   COVID-19 Vaccine (1) Never done   TETANUS/TDAP  Never done   Zoster Vaccines- Shingrix (1 of  2) Never done    Colorectal cancer screening: No longer required.   Mammogram status: No longer required due to age.  Bone Density status: Completed 04/10/2018. Results reflect: Bone density results: OSTEOPOROSIS. Repeat every 2 years.  Lung Cancer Screening: (Low Dose CT Chest recommended if Age 84-80 years, 30 pack-year currently smoking OR have quit w/in 15years.) does not qualify.   Lung Cancer Screening Referral: N/a   Additional Screening:  Hepatitis C Screening: does not qualify;   Vision Screening: Recommended annual ophthalmology exams for early detection of glaucoma and other disorders of the eye. Is the patient up to date with their annual eye exam?  Yes  Who is the provider or what is the name of the office in which the patient attends annual eye exams? Dr. Valetta Close If pt is not established with a provider, would they like to be referred to a provider to establish care? No .   Dental Screening: Recommended annual dental exams for proper oral hygiene  Community Resource Referral / Chronic Care Management: CRR required this visit?  No   CCM required this visit?  No      Plan:     I have personally reviewed and noted the following in the patient's chart:   Medical and social history Use of alcohol, tobacco or illicit drugs  Current medications and supplements including opioid prescriptions. Patient is not currently taking opioid prescriptions. Functional ability and status Nutritional status Physical activity Advanced directives List of other physicians Hospitalizations, surgeries, and ER visits in previous 12 months Vitals Screenings to include cognitive, depression, and falls Referrals and appointments  In addition, I have reviewed and discussed with patient certain preventive protocols, quality metrics, and best practice recommendations. A written personalized care plan for preventive services as well as general preventive health recommendations were provided  to patient.     Ofilia Neas, LPN   2/84/1324   Nurse Notes: None

## 2020-09-06 NOTE — Telephone Encounter (Signed)
Attempted to call pt to let her know I have placed her on the schedule for a virtual visit over the phone to discuss with dr patel. This will be 09-08-20. NA NVM

## 2020-09-06 NOTE — Telephone Encounter (Signed)
Patient son said her mother still having stomach issues and not feeling well what should she do until her appointment with [facility] doctor til July 13.Was seen last week in our office. Call back # [phone].

## 2020-09-06 NOTE — Telephone Encounter (Signed)
During my medicare wellness visit patient stated that she has had some abdominal pain and nausea x 2 weeks.She denied constipation and diarrhea. She states that she is taking her Zofran and Sucralfate however she has not relief. She would like to know if you could call in something to help with nausea and pain. Please advise

## 2020-09-06 NOTE — Patient Instructions (Signed)
Ms. Bruck , Thank you for taking time to come for your Medicare Wellness Visit. I appreciate your ongoing commitment to your health goals. Please review the following plan we discussed and let me know if I can assist you in the future.   Screening recommendations/referrals: Colonoscopy: No longer required  Mammogram: No longer required Bone Density: Currently due, orders placed this visit Recommended yearly ophthalmology/optometry visit for glaucoma screening and checkup Recommended yearly dental visit for hygiene and checkup  Vaccinations: Influenza vaccine: Up to date, next due fall 2022  Pneumococcal vaccine: Completed series  Tdap vaccine: Currently due, if you would like to receive we recommend that you do so at your local pharmacy.  Shingles vaccine: Currently due, if you would like to receive we recommend that you do so at your local pharmacy.    Advanced directives: Advance directive discussed with you today. Even though you declined this today please call our office should you change your mind and we can give you the proper paperwork for you to fill out.   Conditions/risks identified: None   Next appointment: None    Preventive Care 65 Years and Older, Female Preventive care refers to lifestyle choices and visits with your health care provider that can promote health and wellness. What does preventive care include? A yearly physical exam. This is also called an annual well check. Dental exams once or twice a year. Routine eye exams. Ask your health care provider how often you should have your eyes checked. Personal lifestyle choices, including: Daily care of your teeth and gums. Regular physical activity. Eating a healthy diet. Avoiding tobacco and drug use. Limiting alcohol use. Practicing safe sex. Taking low-dose aspirin every day. Taking vitamin and mineral supplements as recommended by your health care provider. What happens during an annual well check? The  services and screenings done by your health care provider during your annual well check will depend on your age, overall health, lifestyle risk factors, and family history of disease. Counseling  Your health care provider may ask you questions about your: Alcohol use. Tobacco use. Drug use. Emotional well-being. Home and relationship well-being. Sexual activity. Eating habits. History of falls. Memory and ability to understand (cognition). Work and work Statistician. Reproductive health. Screening  You may have the following tests or measurements: Height, weight, and BMI. Blood pressure. Lipid and cholesterol levels. These may be checked every 5 years, or more frequently if you are over 87 years old. Skin check. Lung cancer screening. You may have this screening every year starting at age 71 if you have a 30-pack-year history of smoking and currently smoke or have quit within the past 15 years. Fecal occult blood test (FOBT) of the stool. You may have this test every year starting at age 39. Flexible sigmoidoscopy or colonoscopy. You may have a sigmoidoscopy every 5 years or a colonoscopy every 10 years starting at age 25. Hepatitis C blood test. Hepatitis B blood test. Sexually transmitted disease (STD) testing. Diabetes screening. This is done by checking your blood sugar (glucose) after you have not eaten for a while (fasting). You may have this done every 1-3 years. Bone density scan. This is done to screen for osteoporosis. You may have this done starting at age 73. Mammogram. This may be done every 1-2 years. Talk to your health care provider about how often you should have regular mammograms. Talk with your health care provider about your test results, treatment options, and if necessary, the need for more tests. Vaccines  Your health care provider may recommend certain vaccines, such as: Influenza vaccine. This is recommended every year. Tetanus, diphtheria, and acellular  pertussis (Tdap, Td) vaccine. You may need a Td booster every 10 years. Zoster vaccine. You may need this after age 7. Pneumococcal 13-valent conjugate (PCV13) vaccine. One dose is recommended after age 84. Pneumococcal polysaccharide (PPSV23) vaccine. One dose is recommended after age 83. Talk to your health care provider about which screenings and vaccines you need and how often you need them. This information is not intended to replace advice given to you by your health care provider. Make sure you discuss any questions you have with your health care provider. Document Released: 04/09/2015 Document Revised: 12/01/2015 Document Reviewed: 01/12/2015 Elsevier Interactive Patient Education  2017 [employer] Prevention in the Home Falls can cause injuries. They can happen to people of all ages. There are many things you can do to make your home safe and to help prevent falls. What can I do on the outside of my home? Regularly fix the edges of walkways and driveways and fix any cracks. Remove anything that might make you trip as you walk through a door, such as a raised step or threshold. Trim any bushes or trees on the path to your home. Use bright outdoor lighting. Clear any walking paths of anything that might make someone trip, such as rocks or tools. Regularly check to see if handrails are loose or broken. Make sure that both sides of any steps have handrails. Any raised decks and porches should have guardrails on the edges. Have any leaves, snow, or ice cleared regularly. Use sand or salt on walking paths during winter. Clean up any spills in your garage right away. This includes oil or grease spills. What can I do in the bathroom? Use night lights. Install grab bars by the toilet and in the tub and shower. Do not use towel bars as grab bars. Use non-skid mats or decals in the tub or shower. If you need to sit down in the shower, use a plastic, non-slip stool. Keep the floor  dry. Clean up any water that spills on the floor as soon as it happens. Remove soap buildup in the tub or shower regularly. Attach bath mats securely with double-sided non-slip rug tape. Do not have throw rugs and other things on the floor that can make you trip. What can I do in the bedroom? Use night lights. Make sure that you have a light by your bed that is easy to reach. Do not use any sheets or blankets that are too big for your bed. They should not hang down onto the floor. Have a firm chair that has side arms. You can use this for support while you get dressed. Do not have throw rugs and other things on the floor that can make you trip. What can I do in the kitchen? Clean up any spills right away. Avoid walking on wet floors. Keep items that you use a lot in easy-to-reach places. If you need to reach something above you, use a strong step stool that has a grab bar. Keep electrical cords out of the way. Do not use floor polish or wax that makes floors slippery. If you must use wax, use non-skid floor wax. Do not have throw rugs and other things on the floor that can make you trip. What can I do with my stairs? Do not leave any items on the stairs. Make sure that there are  handrails on both sides of the stairs and use them. Fix handrails that are broken or loose. Make sure that handrails are as long as the stairways. Check any carpeting to make sure that it is firmly attached to the stairs. Fix any carpet that is loose or worn. Avoid having throw rugs at the top or bottom of the stairs. If you do have throw rugs, attach them to the floor with carpet tape. Make sure that you have a light switch at the top of the stairs and the bottom of the stairs. If you do not have them, ask someone to add them for you. What else can I do to help prevent falls? Wear shoes that: Do not have high heels. Have rubber bottoms. Are comfortable and fit you well. Are closed at the toe. Do not wear  sandals. If you use a stepladder: Make sure that it is fully opened. Do not climb a closed stepladder. Make sure that both sides of the stepladder are locked into place. Ask someone to hold it for you, if possible. Clearly mark and make sure that you can see: Any grab bars or handrails. First and last steps. Where the edge of each step is. Use tools that help you move around (mobility aids) if they are needed. These include: Canes. Walkers. Scooters. Crutches. Turn on the lights when you go into a dark area. Replace any light bulbs as soon as they burn out. Set up your furniture so you have a clear path. Avoid moving your furniture around. If any of your floors are uneven, fix them. If there are any pets around you, be aware of where they are. Review your medicines with your doctor. Some medicines can make you feel dizzy. This can increase your chance of falling. Ask your doctor what other things that you can do to help prevent falls. This information is not intended to replace advice given to you by your health care provider. Make sure you discuss any questions you have with your health care provider. Document Released: 01/07/2009 Document Revised: 08/19/2015 Document Reviewed: 04/17/2014 Elsevier Interactive Patient Education  2017 [employer].

## 2020-09-08 ENCOUNTER — Encounter: Payer: Self-pay | Admitting: Internal Medicine

## 2020-09-08 ENCOUNTER — Telehealth (INDEPENDENT_AMBULATORY_CARE_PROVIDER_SITE_OTHER): Payer: Medicare Other | Admitting: Internal Medicine

## 2020-09-08 ENCOUNTER — Other Ambulatory Visit: Payer: Self-pay

## 2020-09-08 ENCOUNTER — Inpatient Hospital Stay (HOSPITAL_COMMUNITY): Payer: Medicare Other

## 2020-09-08 DIAGNOSIS — R5383 Other fatigue: Secondary | ICD-10-CM

## 2020-09-08 DIAGNOSIS — E039 Hypothyroidism, unspecified: Secondary | ICD-10-CM

## 2020-09-08 DIAGNOSIS — N184 Chronic kidney disease, stage 4 (severe): Secondary | ICD-10-CM | POA: Diagnosis not present

## 2020-09-08 DIAGNOSIS — K219 Gastro-esophageal reflux disease without esophagitis: Secondary | ICD-10-CM | POA: Diagnosis not present

## 2020-09-08 NOTE — Assessment & Plan Note (Addendum)
Follows up with nephrologist, needs a follow up visit Follows up with hematooncologist for evaluation of elevated light chain levels Advised to follow renal diet as advised by nephrologist and dietitian Avoid nephrotoxic agents including NSAIDs Needs to clarify medications - on Lasix 40 mg BID and Chlorthalidone 25 mg QD, which could be contributing fatigue from dehydration Advised to take Lasix 40 mg QD for now

## 2020-09-08 NOTE — Assessment & Plan Note (Signed)
Continue Levothyroxine to 75 mcg QD Check TSH and T4

## 2020-09-08 NOTE — Assessment & Plan Note (Signed)
On Pantoprazole and Carafate - now better F/u with GI

## 2020-09-08 NOTE — Progress Notes (Signed)
Virtual Visit via Telephone Note   This visit type was conducted due to national recommendations for restrictions regarding the COVID-19 Pandemic (e.g. social distancing) in an effort to limit this patient's exposure and mitigate transmission in our community.  Due to her co-morbid illnesses, this patient is at least at moderate risk for complications without adequate follow up.  This format is felt to be most appropriate for this patient at this time.  The patient did not have access to video technology/had technical difficulties with video requiring transitioning to audio format only (telephone).  All issues noted in this document were discussed and addressed.  No physical exam could be performed with this format.  Please refer to the patient's chart for her  consent to telehealth for [facility].   Evaluation Performed:  Follow-up visit  Date:  09/08/2020   ID:  [name], [name] April 16, 1935, MRN [id number]  Patient Location: Home Provider Location: Office/Clinic  Participants: Patient Location of Patient: Home Location of Provider: Telehealth Consent was obtain for visit to be over via telehealth. I verified that I am speaking with the correct person using two identifiers.  PCP:  Lindell Spar, MD   Chief Complaint:  Fatigue  History of Present Illness:    [name] is a 85 y.o. female who has a televisit for c/o fatigue for last few days. She has been taking Lasix 40 mg twice daily, which was not reported to [place]. She thinks that she is taking too much of water pill. It is not clear whether she has seen Nephrologist recently.  She has been taking Pantoprazole and Carafate for GERD and feels better compared to prior. Denies hematemesis, melena or hematochezia.  The patient does not have symptoms concerning for COVID-19 infection (fever, chills, cough, or new shortness of breath).   Past Medical, Surgical, Social History, Allergies, and Medications have been  Reviewed.  Past Medical History:  Diagnosis Date   Anxiety disorder, unspecified    Arteriosclerotic cardiovascular disease (ASCVD) 2006   Nonobstructive; < 50% lesions on cath 2002; negative stress nuclear study in 10/2004   Asthma    Chest pain, unspecified    Chronic kidney disease, unspecified    Chronic obstructive pulmonary disease, unspecified ([place])    Chronic pelvic pain in female    Colitis due to Clostridium difficile 1994   1994   COPD (chronic obstructive pulmonary disease) ([place])    Depression with anxiety    Diarrhea, unspecified    Disorder of thyroid, unspecified    Gastro-esophageal reflux disease without esophagitis    Gastroesophageal reflux disease    Hyperlipidemia    Hyperlipidemia, unspecified    Hypertension    Hypertensive chronic kidney disease with stage 1 through stage 4 chronic kidney disease, or unspecified chronic kidney disease    Hyperthyroidism    Hypothyroidism    Hypothyroidism, unspecified    Kidney disease, chronic, stage III (moderate, EGFR 30-59 ml/min) (HCC)    Lower abdominal pain, unspecified    Major depressive disorder, single episode, severe without psychotic features ([place])    Major depressive disorder, single episode, unspecified    Nausea    Other postherpetic nervous system involvement    Pelvic and perineal pain    Rash and other nonspecific skin eruption    S/P colonoscopy 2007   few diverticula, otherwise nl   S/P colonoscopy 12/13/10   rectal, cecal polyp, left-sided diverticulosis, hyperplastic. ?anal fissure? treated empirically   S/P endoscopy 2009  linear esophageal erosions   S/P endoscopy 05/10/10   Question island of salmon-colored epithelium in distal esophagus ; no Barrett's.    Shingles    Tobacco abuse, in remission    55-pack-year consumption; discontinued 08/2010   Unspecified asthma, uncomplicated    Unspecified osteoarthritis, unspecified site    Urinary tract infection, site not specified    Past  Surgical History:  Procedure Laterality Date   ABDOMINAL HYSTERECTOMY     BREAST LUMPECTOMY     CATARACT EXTRACTION     CHOLECYSTECTOMY  1962   COLONOSCOPY  12/13/2010   Left-sided diverticulosis.  Cecal polyp, status post hot snare polypectomy/ Diminutive rectal polyp, status post cold biopsy removal tender/painful anal canal, ? occult anal fissure- Not visualized   COLONOSCOPY N/A 10/14/2015   Diverticulosis   ESOPHAGOGASTRODUODENOSCOPY  05/10/10   benign mucosa with mild chronic inflammation.   ESOPHAGOGASTRODUODENOSCOPY (EGD) WITH PROPOFOL N/A 02/13/2019   Procedure: ESOPHAGOGASTRODUODENOSCOPY (EGD) WITH PROPOFOL;  Surgeon: Daneil Dolin, MD;  Location: [facility];  Service: Endoscopy;  Laterality: N/A;  3:00pm   FLEXIBLE SIGMOIDOSCOPY  12/29/2011   Procedure: FLEXIBLE SIGMOIDOSCOPY;  Surgeon: Rogene Houston, MD;  Location: [facility];  Service: Endoscopy;  Laterality: N/A;  200-Ann notified pt to be here @ 1:61   NISSEN FUNDOPLICATION     YAG LASER APPLICATION Left 0/96/0454   Procedure: YAG LASER APPLICATION;  Surgeon: Williams Che, MD;  Location: [facility];  Service: Ophthalmology;  Laterality: Left;     Current Meds  Medication Sig   albuterol (VENTOLIN HFA) 108 (90 Base) MCG/ACT inhaler Inhale 1-2 puffs into the lungs every 6 (six) hours as needed for wheezing or shortness of breath.   amLODipine (NORVASC) 5 MG tablet Take 1 tablet (5 mg total) by mouth daily.   atorvastatin (LIPITOR) 20 MG tablet Take 20 mg every evening by mouth.    carvedilol (COREG) 6.25 MG tablet TAKE (1) TABLET BY MOUTH TWICE DAILY.   chlorthalidone (HYGROTON) 25 MG tablet Take 1 tablet (25 mg total) by mouth daily.   famotidine (PEPCID) 20 MG tablet Take 1 tablet (20 mg total) by mouth 2 (two) times daily.   furosemide (LASIX) 40 MG tablet Take 40 mg by mouth daily.   gabapentin (NEURONTIN) 100 MG capsule TAKE 1 CAPSULE BY MOUTH THREE TIMES A DAY.   HYDROcodone-acetaminophen (NORCO) 10-325 MG  tablet Take 0.5-2 tablets by mouth every 4 (four) hours as needed (1/2 tablet for mild pain, 1 tablet for moderate pain, 2 tablets for severe pain).   hydroxypropyl methylcellulose (ISOPTO TEARS) 2.5 % ophthalmic solution Place 1 drop into both eyes 3 (three) times daily as needed. For dry eyes.   levothyroxine (SYNTHROID) 88 MCG tablet Take 1 tablet (88 mcg total) by mouth daily before breakfast.   LINZESS 145 MCG CAPS capsule TAKE 1 CAPSULE ONCE DAILY BEFORE BREAKFAST.   lisinopril (ZESTRIL) 5 MG tablet Take 5 mg by mouth daily.   Multiple Vitamins-Minerals (EYE VITAMINS PO) Take 1 tablet by mouth daily.    ondansetron (ZOFRAN) 4 MG tablet TAKE 1 TABLET BY MOUTH THREE TIMES AS NEEDED FOR NAUSEA.   pantoprazole (PROTONIX) 40 MG tablet Take 1 tablet (40 mg total) by mouth 2 (two) times daily.   permethrin (ELIMITE) 5 % cream WASH HAIR WITH REGULAR SHAMPOO, THEN RINSE AND TOWEL DRY HAIR. SATURATE HAIR AND SCALP ESPECIALLY BEHIND EARS, LEAVE IN FOR 10 MINTUES, THEN RINSE WITH WATER.   potassium chloride SA (K-DUR) 20 MEQ tablet Take  20 mEq by mouth daily with lunch.    sucralfate (CARAFATE) 1 g tablet Take 1 tablet (1 g total) by mouth 4 (four) times daily -  with meals and at bedtime.   [DISCONTINUED] omeprazole (PRILOSEC) 40 MG capsule TAKE (1) CAPSULE BY MOUTH TWICE DAILY FOR REFLUX.     Allergies:   Hydralazine, Paroxetine, Sulfa antibiotics, Venlafaxine, and Nalbuphine   ROS:   Please see the history of present illness.     All other systems reviewed and are negative.   Labs/Other Tests and Data Reviewed:    Recent Labs: 06/14/2020: TSH 4.600 08/21/2020: ALT 13 08/31/2020: BUN 60; Creatinine, Ser 3.82; Hemoglobin 12.0; Platelets 286; Potassium 3.9; Sodium 141   Recent Lipid Panel Lab Results  Component Value Date/Time   CHOL 164 09/17/2018 12:00 AM   TRIG 145 09/17/2018 12:00 AM   HDL 44 09/17/2018 12:00 AM   CHOLHDL 2.9 07/15/2008 05:28 AM   LDLCALC 95 09/17/2018 12:00 AM    Wt  Readings from Last 3 Encounters:  08/31/20 135 lb (61.2 kg)  08/21/20 139 lb (63 kg)  08/03/20 142 lb 12.8 oz (64.8 kg)      ASSESSMENT & PLAN:    Fatigue Could be due to electrolyte imbalance from polypharmacy Appears that she is on multiple diuretics - advised to bring medications in the next visit Check BMP, TSH and free T4  Gastro-esophageal reflux disease without esophagitis On Pantoprazole and Carafate - now better F/u with GI  Hypothyroidism Continue Levothyroxine to 75 mcg QD Check TSH and T4  CKD (chronic kidney disease) stage 4, GFR 15-29 ml/min (HCC) Follows up with nephrologist, needs a follow up visit Follows up with hematooncologist for evaluation of elevated light chain levels Advised to follow renal diet as advised by nephrologist and dietitian Avoid nephrotoxic agents including NSAIDs Needs to clarify medications - on Lasix 40 mg BID and Chlorthalidone 25 mg QD, which could be contributing fatigue from dehydration Advised to take Lasix 40 mg QD for now   Time:   Today, I have spent 18 minutes reviewing the chart, including problem list, medications, and with the patient with telehealth technology discussing the above problems.   Medication Adjustments/Labs and Tests Ordered: Current medicines are reviewed at length with the patient today.  Concerns regarding medicines are outlined above.   Tests Ordered: Orders Placed This Encounter  Procedures   Basic Metabolic Panel (BMET)   TSH + free T4    Medication Changes: No orders of the defined types were placed in this encounter.    Note: This dictation was prepared with Dragon dictation along with smaller phrase technology. Similar sounding words can be transcribed inadequately or may not be corrected upon review. Any transcriptional errors that result from this process are unintentional.      Disposition:  Follow up  Signed, Lindell Spar, MD  09/08/2020 12:11 PM     [facility]

## 2020-09-08 NOTE — Patient Instructions (Signed)
Please take Lasix 40 mg once daily. Avoid taking Chlorthalidone until you visit Nephrologist.  Please follow up with Nephrologist regularly for CKD.  Please get blood tests done within a week.

## 2020-09-15 ENCOUNTER — Ambulatory Visit (HOSPITAL_COMMUNITY): Payer: Medicare Other | Admitting: Hematology

## 2020-09-17 ENCOUNTER — Other Ambulatory Visit: Payer: Self-pay | Admitting: Internal Medicine

## 2020-09-29 ENCOUNTER — Other Ambulatory Visit: Payer: Self-pay | Admitting: Internal Medicine

## 2020-09-29 DIAGNOSIS — E039 Hypothyroidism, unspecified: Secondary | ICD-10-CM | POA: Diagnosis not present

## 2020-09-29 DIAGNOSIS — N184 Chronic kidney disease, stage 4 (severe): Secondary | ICD-10-CM | POA: Diagnosis not present

## 2020-09-29 DIAGNOSIS — R5383 Other fatigue: Secondary | ICD-10-CM | POA: Diagnosis not present

## 2020-09-30 LAB — BASIC METABOLIC PANEL
BUN/Creatinine Ratio: 17 (ref 12–28)
BUN: 77 mg/dL (ref 8–27)
CO2: 20 mmol/L (ref 20–29)
Calcium: 9.3 mg/dL (ref 8.7–10.3)
Chloride: 96 mmol/L (ref 96–106)
Creatinine, Ser: 4.52 mg/dL — ABNORMAL HIGH (ref 0.57–1.00)
Glucose: 111 mg/dL — ABNORMAL HIGH (ref 65–99)
Potassium: 4.3 mmol/L (ref 3.5–5.2)
Sodium: 137 mmol/L (ref 134–144)
eGFR: 9 mL/min/{1.73_m2} — ABNORMAL LOW (ref >59)

## 2020-09-30 LAB — TSH+FREE T4
Free T4: 1.54 ng/dL (ref 0.82–1.77)
TSH: 0.989 u[IU]/mL (ref 0.450–4.500)

## 2020-10-04 ENCOUNTER — Other Ambulatory Visit (HOSPITAL_COMMUNITY): Payer: Self-pay | Admitting: *Deleted

## 2020-10-04 DIAGNOSIS — R768 Other specified abnormal immunological findings in serum: Secondary | ICD-10-CM

## 2020-10-05 ENCOUNTER — Other Ambulatory Visit: Payer: Self-pay

## 2020-10-05 ENCOUNTER — Inpatient Hospital Stay (HOSPITAL_COMMUNITY): Payer: Medicare Other | Attending: Hematology

## 2020-10-05 DIAGNOSIS — Z8249 Family history of ischemic heart disease and other diseases of the circulatory system: Secondary | ICD-10-CM | POA: Insufficient documentation

## 2020-10-05 DIAGNOSIS — K219 Gastro-esophageal reflux disease without esophagitis: Secondary | ICD-10-CM | POA: Diagnosis not present

## 2020-10-05 DIAGNOSIS — R768 Other specified abnormal immunological findings in serum: Secondary | ICD-10-CM | POA: Diagnosis not present

## 2020-10-05 DIAGNOSIS — M549 Dorsalgia, unspecified: Secondary | ICD-10-CM | POA: Insufficient documentation

## 2020-10-05 DIAGNOSIS — Z818 Family history of other mental and behavioral disorders: Secondary | ICD-10-CM | POA: Diagnosis not present

## 2020-10-05 DIAGNOSIS — Z87891 Personal history of nicotine dependence: Secondary | ICD-10-CM | POA: Insufficient documentation

## 2020-10-05 DIAGNOSIS — J449 Chronic obstructive pulmonary disease, unspecified: Secondary | ICD-10-CM | POA: Diagnosis not present

## 2020-10-05 DIAGNOSIS — Z809 Family history of malignant neoplasm, unspecified: Secondary | ICD-10-CM | POA: Insufficient documentation

## 2020-10-05 DIAGNOSIS — Z8719 Personal history of other diseases of the digestive system: Secondary | ICD-10-CM | POA: Insufficient documentation

## 2020-10-05 DIAGNOSIS — E039 Hypothyroidism, unspecified: Secondary | ICD-10-CM | POA: Insufficient documentation

## 2020-10-05 DIAGNOSIS — N184 Chronic kidney disease, stage 4 (severe): Secondary | ICD-10-CM | POA: Insufficient documentation

## 2020-10-05 DIAGNOSIS — Z87440 Personal history of urinary (tract) infections: Secondary | ICD-10-CM | POA: Insufficient documentation

## 2020-10-05 DIAGNOSIS — I129 Hypertensive chronic kidney disease with stage 1 through stage 4 chronic kidney disease, or unspecified chronic kidney disease: Secondary | ICD-10-CM | POA: Insufficient documentation

## 2020-10-05 DIAGNOSIS — M1990 Unspecified osteoarthritis, unspecified site: Secondary | ICD-10-CM | POA: Insufficient documentation

## 2020-10-05 DIAGNOSIS — Z9049 Acquired absence of other specified parts of digestive tract: Secondary | ICD-10-CM | POA: Insufficient documentation

## 2020-10-05 DIAGNOSIS — Z882 Allergy status to sulfonamides status: Secondary | ICD-10-CM | POA: Insufficient documentation

## 2020-10-05 DIAGNOSIS — Z833 Family history of diabetes mellitus: Secondary | ICD-10-CM | POA: Diagnosis not present

## 2020-10-05 DIAGNOSIS — Z79899 Other long term (current) drug therapy: Secondary | ICD-10-CM | POA: Insufficient documentation

## 2020-10-06 ENCOUNTER — Encounter: Payer: Self-pay | Admitting: Internal Medicine

## 2020-10-06 ENCOUNTER — Ambulatory Visit: Payer: Medicare Other | Admitting: Gastroenterology

## 2020-10-06 LAB — KAPPA/LAMBDA LIGHT CHAINS
Kappa free light chain: 125.3 mg/L — ABNORMAL HIGH (ref 3.3–19.4)
Kappa, lambda light chain ratio: 3.1 — ABNORMAL HIGH (ref 0.26–1.65)
Lambda free light chains: 40.4 mg/L — ABNORMAL HIGH (ref 5.7–26.3)

## 2020-10-07 LAB — PROTEIN ELECTROPHORESIS, SERUM, WITH REFLEX
A/G Ratio: 1.3 (ref 0.7–1.7)
Albumin ELP: 3.7 g/dL (ref 2.9–4.4)
Alpha-1-Globulin: 0.1 g/dL (ref 0.0–0.4)
Alpha-2-Globulin: 0.9 g/dL (ref 0.4–1.0)
Beta Globulin: 1 g/dL (ref 0.7–1.3)
Gamma Globulin: 0.7 g/dL (ref 0.4–1.8)
Globulin, Total: 2.8 g/dL (ref 2.2–3.9)
Total Protein ELP: 6.5 g/dL (ref 6.0–8.5)

## 2020-10-07 LAB — IMMUNOFIXATION ELECTROPHORESIS
IgA: 319 mg/dL (ref 64–422)
IgG (Immunoglobin G), Serum: 790 mg/dL (ref 586–1602)
IgM (Immunoglobulin M), Srm: 30 mg/dL (ref 26–217)
Total Protein ELP: 6.5 g/dL (ref 6.0–8.5)

## 2020-10-11 DIAGNOSIS — M549 Dorsalgia, unspecified: Secondary | ICD-10-CM | POA: Diagnosis not present

## 2020-10-11 DIAGNOSIS — M1990 Unspecified osteoarthritis, unspecified site: Secondary | ICD-10-CM | POA: Diagnosis not present

## 2020-10-11 DIAGNOSIS — Z8249 Family history of ischemic heart disease and other diseases of the circulatory system: Secondary | ICD-10-CM | POA: Diagnosis not present

## 2020-10-11 DIAGNOSIS — Z882 Allergy status to sulfonamides status: Secondary | ICD-10-CM | POA: Diagnosis not present

## 2020-10-11 DIAGNOSIS — K219 Gastro-esophageal reflux disease without esophagitis: Secondary | ICD-10-CM | POA: Diagnosis not present

## 2020-10-11 DIAGNOSIS — I129 Hypertensive chronic kidney disease with stage 1 through stage 4 chronic kidney disease, or unspecified chronic kidney disease: Secondary | ICD-10-CM | POA: Diagnosis not present

## 2020-10-11 DIAGNOSIS — E039 Hypothyroidism, unspecified: Secondary | ICD-10-CM | POA: Diagnosis not present

## 2020-10-11 DIAGNOSIS — Z87891 Personal history of nicotine dependence: Secondary | ICD-10-CM | POA: Diagnosis not present

## 2020-10-11 DIAGNOSIS — Z8719 Personal history of other diseases of the digestive system: Secondary | ICD-10-CM | POA: Diagnosis not present

## 2020-10-11 DIAGNOSIS — Z87440 Personal history of urinary (tract) infections: Secondary | ICD-10-CM | POA: Diagnosis not present

## 2020-10-11 DIAGNOSIS — Z833 Family history of diabetes mellitus: Secondary | ICD-10-CM | POA: Diagnosis not present

## 2020-10-11 DIAGNOSIS — J449 Chronic obstructive pulmonary disease, unspecified: Secondary | ICD-10-CM | POA: Diagnosis not present

## 2020-10-11 DIAGNOSIS — R768 Other specified abnormal immunological findings in serum: Secondary | ICD-10-CM | POA: Diagnosis not present

## 2020-10-11 DIAGNOSIS — Z79899 Other long term (current) drug therapy: Secondary | ICD-10-CM | POA: Diagnosis not present

## 2020-10-11 DIAGNOSIS — Z809 Family history of malignant neoplasm, unspecified: Secondary | ICD-10-CM | POA: Diagnosis not present

## 2020-10-11 DIAGNOSIS — N184 Chronic kidney disease, stage 4 (severe): Secondary | ICD-10-CM | POA: Diagnosis not present

## 2020-10-11 DIAGNOSIS — Z9049 Acquired absence of other specified parts of digestive tract: Secondary | ICD-10-CM | POA: Diagnosis not present

## 2020-10-12 ENCOUNTER — Other Ambulatory Visit (HOSPITAL_COMMUNITY): Payer: Self-pay

## 2020-10-12 ENCOUNTER — Other Ambulatory Visit: Payer: Self-pay | Admitting: Internal Medicine

## 2020-10-12 ENCOUNTER — Inpatient Hospital Stay (HOSPITAL_BASED_OUTPATIENT_CLINIC_OR_DEPARTMENT_OTHER): Payer: Medicare Other | Admitting: Hematology and Oncology

## 2020-10-12 ENCOUNTER — Other Ambulatory Visit: Payer: Self-pay

## 2020-10-12 VITALS — BP 108/66 | HR 78 | Temp 96.8°F | Resp 17 | Wt 128.8 lb

## 2020-10-12 DIAGNOSIS — Z809 Family history of malignant neoplasm, unspecified: Secondary | ICD-10-CM | POA: Diagnosis not present

## 2020-10-12 DIAGNOSIS — M549 Dorsalgia, unspecified: Secondary | ICD-10-CM | POA: Diagnosis not present

## 2020-10-12 DIAGNOSIS — I129 Hypertensive chronic kidney disease with stage 1 through stage 4 chronic kidney disease, or unspecified chronic kidney disease: Secondary | ICD-10-CM | POA: Diagnosis not present

## 2020-10-12 DIAGNOSIS — R768 Other specified abnormal immunological findings in serum: Secondary | ICD-10-CM | POA: Diagnosis not present

## 2020-10-12 DIAGNOSIS — K219 Gastro-esophageal reflux disease without esophagitis: Secondary | ICD-10-CM | POA: Diagnosis not present

## 2020-10-12 DIAGNOSIS — M1990 Unspecified osteoarthritis, unspecified site: Secondary | ICD-10-CM | POA: Diagnosis not present

## 2020-10-12 DIAGNOSIS — Z87440 Personal history of urinary (tract) infections: Secondary | ICD-10-CM | POA: Diagnosis not present

## 2020-10-12 DIAGNOSIS — E039 Hypothyroidism, unspecified: Secondary | ICD-10-CM | POA: Diagnosis not present

## 2020-10-12 DIAGNOSIS — J449 Chronic obstructive pulmonary disease, unspecified: Secondary | ICD-10-CM | POA: Diagnosis not present

## 2020-10-12 DIAGNOSIS — Z87891 Personal history of nicotine dependence: Secondary | ICD-10-CM | POA: Diagnosis not present

## 2020-10-12 DIAGNOSIS — Z9049 Acquired absence of other specified parts of digestive tract: Secondary | ICD-10-CM | POA: Diagnosis not present

## 2020-10-12 DIAGNOSIS — Z79899 Other long term (current) drug therapy: Secondary | ICD-10-CM | POA: Diagnosis not present

## 2020-10-12 DIAGNOSIS — Z882 Allergy status to sulfonamides status: Secondary | ICD-10-CM | POA: Diagnosis not present

## 2020-10-12 DIAGNOSIS — Z8249 Family history of ischemic heart disease and other diseases of the circulatory system: Secondary | ICD-10-CM | POA: Diagnosis not present

## 2020-10-12 DIAGNOSIS — N184 Chronic kidney disease, stage 4 (severe): Secondary | ICD-10-CM | POA: Diagnosis not present

## 2020-10-12 DIAGNOSIS — Z833 Family history of diabetes mellitus: Secondary | ICD-10-CM | POA: Diagnosis not present

## 2020-10-12 DIAGNOSIS — Z8719 Personal history of other diseases of the digestive system: Secondary | ICD-10-CM | POA: Diagnosis not present

## 2020-10-12 NOTE — Progress Notes (Signed)
[place] [phone]   [phone]  PROGRESS NOTE  Patient Care Team: Lindell Spar, MD as PCP - General (Internal Medicine) Fay Records, MD as PCP - Cardiology (Cardiology) Gala Romney Cristopher Estimable, MD (Gastroenterology) Lattie Haw Cristopher Estimable, MD (Inactive) (Cardiology) Rexene Agent, MD as Attending Physician (Nephrology)  Hematological/Oncological History # Elevated Serum Free Light Chains 03/09/2020: establish care with Dr. Burr Medico 04/07/2020: Met bone survey shows no evidence of lytic lesions. 10/05/2020: IFE with no M protein.  Kappa 125.3, Lambda 40.4, ratio 3.1. SPEP with no M spike  Interval History:  [name] 85 y.o. female with medical history significant for elevated serum free light chains who presents for a follow up visit. The patient's last visit was on 03/09/2020. In the interim since the last visit she had subsequent studies which showed an elevated serum free light chain ratio but no evidence of monoclonal protein.  On exam today Mr. Conkle made by her son.  She reports that she suffers from low levels of energy.  She notes that she does have some chronic bone and back pain.  She is moving more slowly as she walks and moves to the kitchen.  She notes that she is urinating "right much".  She notes that there is no foaming of the urine or blood in the urine.  She also had a fall about 1 week ago where she bumped her elbow.  It was a mechanical fall where she tripped.  She otherwise denies any fevers, chills, sweats, nausea, or diarrhea.  A full 10 point ROS is listed below.  MEDICAL HISTORY:  Past Medical History:  Diagnosis Date   Anxiety disorder, unspecified    Arteriosclerotic cardiovascular disease (ASCVD) 2006   Nonobstructive; < 50% lesions on cath 2002; negative stress nuclear study in 10/2004   Asthma    Chest pain, unspecified    Chronic kidney disease, unspecified    Chronic obstructive pulmonary disease, unspecified ([place])     Chronic pelvic pain in female    Colitis due to Clostridium difficile 1994   1994   COPD (chronic obstructive pulmonary disease) ([place])    Depression with anxiety    Diarrhea, unspecified    Disorder of thyroid, unspecified    Gastro-esophageal reflux disease without esophagitis    Gastroesophageal reflux disease    Hyperlipidemia    Hyperlipidemia, unspecified    Hypertension    Hypertensive chronic kidney disease with stage 1 through stage 4 chronic kidney disease, or unspecified chronic kidney disease    Hyperthyroidism    Hypothyroidism    Hypothyroidism, unspecified    Kidney disease, chronic, stage III (moderate, EGFR 30-59 ml/min) (HCC)    Lower abdominal pain, unspecified    Major depressive disorder, single episode, severe without psychotic features ([place])    Major depressive disorder, single episode, unspecified    Nausea    Other postherpetic nervous system involvement    Pelvic and perineal pain    Rash and other nonspecific skin eruption    S/P colonoscopy 2007   few diverticula, otherwise nl   S/P colonoscopy 12/13/10   rectal, cecal polyp, left-sided diverticulosis, hyperplastic. ?anal fissure? treated empirically   S/P endoscopy 2009   linear esophageal erosions   S/P endoscopy 05/10/10   Question island of salmon-colored epithelium in distal esophagus ; no Barrett's.    Shingles    Tobacco abuse, in remission    55-pack-year consumption; discontinued 08/2010   Unspecified asthma, uncomplicated    Unspecified  osteoarthritis, unspecified site    Urinary tract infection, site not specified     SURGICAL HISTORY: Past Surgical History:  Procedure Laterality Date   ABDOMINAL HYSTERECTOMY     BREAST LUMPECTOMY     CATARACT EXTRACTION     CHOLECYSTECTOMY  1962   COLONOSCOPY  12/13/2010   Left-sided diverticulosis.  Cecal polyp, status post hot snare polypectomy/ Diminutive rectal polyp, status post cold biopsy removal tender/painful anal canal, ? occult anal  fissure- Not visualized   COLONOSCOPY N/A 10/14/2015   Diverticulosis   ESOPHAGOGASTRODUODENOSCOPY  05/10/10   benign mucosa with mild chronic inflammation.   ESOPHAGOGASTRODUODENOSCOPY (EGD) WITH PROPOFOL N/A 02/13/2019   Procedure: ESOPHAGOGASTRODUODENOSCOPY (EGD) WITH PROPOFOL;  Surgeon: Daneil Dolin, MD;  [facility]: [facility];  Service: Endoscopy;  Laterality: N/A;  3:00pm   FLEXIBLE SIGMOIDOSCOPY  12/29/2011   Procedure: FLEXIBLE SIGMOIDOSCOPY;  Surgeon: Rogene Houston, MD;  Location: [facility];  Service: Endoscopy;  Laterality: N/A;  200-Ann notified pt to be here @ 6:78   NISSEN FUNDOPLICATION     YAG LASER APPLICATION Left 9/38/1017   Procedure: YAG LASER APPLICATION;  Surgeon: Williams Che, MD;  Location: [facility];  Service: Ophthalmology;  Laterality: Left;    SOCIAL HISTORY: Social History   Socioeconomic History   Marital status: Married    Spouse name: Not on file   Number of children: 4   Years of education: Not on file   Highest education level: Not on file  Occupational History   Not on file  Tobacco Use   Smoking status: Former    Packs/day: 1.00    Years: 55.00    Pack years: 55.00    Types: Cigarettes    Quit date: 09/15/2010    Years since quitting: 10.0   Smokeless tobacco: Never  Vaping Use   Vaping Use: Never used  Substance and Sexual Activity   Alcohol use: No    Alcohol/week: 0.0 standard drinks   Drug use: No   Sexual activity: Not Currently  Other Topics Concern   Not on file  Social History Narrative   Not on file   Social Determinants of Health   Financial Resource Strain: Low Risk    Difficulty of Paying Living Expenses: Not hard at all  Food Insecurity: No Food Insecurity   Worried About Charity fundraiser in the Last Year: Never true   [place] in the Last Year: Never true  Transportation Needs: No Transportation Needs   Lack of Transportation (Medical): No   Lack of Transportation (Non-Medical): No  Physical  Activity: Inactive   Days of Exercise per Week: 0 days   Minutes of Exercise per Session: 0 min  Stress: No Stress Concern Present   Feeling of Stress : Not at all  Social Connections: Moderately Isolated   Frequency of Communication with Friends and Family: More than three times a week   Frequency of Social Gatherings with Friends and Family: More than three times a week   Attends Religious Services: Never   Marine scientist or Organizations: No   Attends Music therapist: Never   Marital Status: Married  Human resources officer Violence: Not At Risk   Fear of Current or Ex-Partner: No   Emotionally Abused: No   Physically Abused: No   Sexually Abused: No    FAMILY HISTORY: Family History  Problem Relation Age of Onset   Diabetes Mother    Alzheimer's disease Mother  Cancer Mother    Heart disease Father    Cancer Sister    Heart disease Brother        Also mother, Father, brother and 2 sons   Cancer Son        testicular cancer    Aneurysm Son    Diabetes Son    Hypertension Brother        also Sister x2   Colon cancer Neg Hx     ALLERGIES:  is allergic to hydralazine, paroxetine, sulfa antibiotics, venlafaxine, and nalbuphine.  MEDICATIONS:  Current Outpatient Medications  Medication Sig Dispense Refill   amLODipine (NORVASC) 5 MG tablet Take 1 tablet (5 mg total) by mouth daily. 30 tablet 3   atorvastatin (LIPITOR) 20 MG tablet Take 20 mg every evening by mouth.      carvedilol (COREG) 6.25 MG tablet TAKE (1) TABLET BY MOUTH TWICE DAILY. 180 tablet 1   chlorthalidone (HYGROTON) 25 MG tablet Take 1 tablet (25 mg total) by mouth daily. 30 tablet 2   DULoxetine (CYMBALTA) 60 MG capsule Take 60 mg by mouth daily.     famotidine (PEPCID) 20 MG tablet Take 1 tablet (20 mg total) by mouth 2 (two) times daily. 30 tablet 0   furosemide (LASIX) 40 MG tablet Take 40 mg by mouth daily.     gabapentin (NEURONTIN) 100 MG capsule TAKE 1 CAPSULE BY MOUTH THREE  TIMES A DAY. 90 capsule 0   hydrochlorothiazide (HYDRODIURIL) 25 MG tablet Take 25 mg by mouth daily.     levothyroxine (SYNTHROID) 88 MCG tablet Take 1 tablet (88 mcg total) by mouth daily before breakfast. 90 tablet 1   LINZESS 145 MCG CAPS capsule TAKE 1 CAPSULE ONCE DAILY BEFORE BREAKFAST. 90 capsule 3   lisinopril (ZESTRIL) 10 MG tablet Take 10 mg by mouth at bedtime.     lisinopril (ZESTRIL) 5 MG tablet Take 5 mg by mouth daily.     Multiple Vitamins-Minerals (EYE VITAMINS PO) Take 1 tablet by mouth daily.      ondansetron (ZOFRAN) 4 MG tablet TAKE 1 TABLET BY MOUTH THREE TIMES AS NEEDED FOR NAUSEA. 90 tablet 0   pantoprazole (PROTONIX) 40 MG tablet TAKE (1) TABLET BY MOUTH TWICE DAILY. 60 tablet 0   permethrin (ELIMITE) 5 % cream WASH HAIR WITH REGULAR SHAMPOO, THEN RINSE AND TOWEL DRY HAIR. SATURATE HAIR AND SCALP ESPECIALLY BEHIND EARS, LEAVE IN FOR 10 MINTUES, THEN RINSE WITH WATER. 60 g 0   potassium chloride SA (K-DUR) 20 MEQ tablet Take 20 mEq by mouth daily with lunch.      sucralfate (CARAFATE) 1 g tablet Take 1 tablet (1 g total) by mouth 4 (four) times daily -  with meals and at bedtime. 90 tablet 1   albuterol (VENTOLIN HFA) 108 (90 Base) MCG/ACT inhaler Inhale 1-2 puffs into the lungs every 6 (six) hours as needed for wheezing or shortness of breath. (Patient not taking: Reported on 10/12/2020) 8 g 5   DULoxetine (CYMBALTA) 30 MG capsule Take 1 capsule (30 mg total) by mouth daily. 90 capsule 0   HYDROcodone-acetaminophen (NORCO) 10-325 MG tablet Take 0.5-2 tablets by mouth every 4 (four) hours as needed (1/2 tablet for mild pain, 1 tablet for moderate pain, 2 tablets for severe pain). (Patient not taking: Reported on 10/12/2020) 80 tablet 0   hydroxypropyl methylcellulose (ISOPTO TEARS) 2.5 % ophthalmic solution Place 1 drop into both eyes 3 (three) times daily as needed. For dry eyes. (Patient not taking: Reported on  10/12/2020)     No current facility-administered medications for  this visit.    REVIEW OF SYSTEMS:   Constitutional: ( - ) fevers, ( - )  chills , ( - ) night sweats Eyes: ( - ) blurriness of vision, ( - ) double vision, ( - ) watery eyes Ears, nose, mouth, throat, and face: ( - ) mucositis, ( - ) sore throat Respiratory: ( - ) cough, ( - ) dyspnea, ( - ) wheezes Cardiovascular: ( - ) palpitation, ( - ) chest discomfort, ( - ) lower extremity swelling Gastrointestinal:  ( - ) nausea, ( - ) heartburn, ( - ) change in bowel habits Skin: ( - ) abnormal skin rashes Lymphatics: ( - ) new lymphadenopathy, ( - ) easy bruising Neurological: ( - ) numbness, ( - ) tingling, ( - ) new weaknesses Behavioral/Psych: ( - ) mood change, ( - ) new changes  All other systems were reviewed with the patient and are negative.  PHYSICAL EXAMINATION: Vitals:   10/12/20 1059  BP: 108/66  Pulse: 78  Resp: 17  Temp: (!) 96.8 F (36 C)  SpO2: 98%   Filed Weights   10/12/20 1059  Weight: 128 lb 12.8 oz (58.4 kg)    GENERAL: Well-appearing elderly Caucasian female, alert, no distress and comfortable SKIN: skin color, texture, turgor are normal, no rashes or significant lesions EYES: conjunctiva are pink and non-injected, sclera clear LUNGS: clear to auscultation and percussion with normal breathing effort HEART: regular rate & rhythm and no murmurs and no lower extremity edema  PSYCH: alert & oriented x 3, fluent speech NEURO: no focal motor/sensory deficits  LABORATORY DATA:  I have reviewed the data as listed CBC Latest Ref Rng & Units 08/31/2020 08/21/2020 12/01/2019  WBC 3.4 - 10.8 x10E3/uL 7.6 7.0 6.3  Hemoglobin 11.1 - 15.9 g/dL 12.0 12.1 11.5(L)  Hematocrit 34.0 - 46.6 % 36.3 36.0 36.7  Platelets 150 - 450 x10E3/uL 286 312 212    CMP Latest Ref Rng & Units 09/29/2020 08/31/2020 08/21/2020  Glucose 65 - 99 mg/dL 111(H) 111(H) 121(H)  BUN 8 - 27 mg/dL 77(HH) 60(H) 66(H)  Creatinine 0.57 - 1.00 mg/dL 4.52(H) 3.82(H) 3.14(H)  Sodium 134 - 144 mmol/L 137 141 136   Potassium 3.5 - 5.2 mmol/L 4.3 3.9 2.9(L)  Chloride 96 - 106 mmol/L 96 96 93(L)  CO2 20 - 29 mmol/L _0 Calcium 8.7 - 10.3 mg/dL 9.3 10.3 9.6  Total Protein 6.5 - 8.1 g/dL - - 7.8  Total Bilirubin 0.3 - 1.2 mg/dL - - 0.6  Alkaline Phos 38 - 126 U/L - - 70  AST 15 - 41 U/L - - 18  ALT 0 - 44 U/L - - 13    No results found for: MPROTEIN Lab Results  Component Value Date   KPAFRELGTCHN 125.3 (H) 10/05/2020   KPAFRELGTCHN 197.0 (H) 06/08/2020   LAMBDASER 40.4 (H) 10/05/2020   LAMBDASER 243.2 (H) 06/08/2020   KAPLAMBRATIO 3.10 (H) 10/05/2020   KAPLAMBRATIO 0.81 06/08/2020   KAPLAMBRATIO 6.75 04/09/2020    RADIOGRAPHIC STUDIES: No results found.  ASSESSMENT & PLAN [name] 85 y.o. female with medical history significant for elevated serum free light chains who presents for a follow up visit.  #Elevated Serum Free Light Chains  -- Elevated ratios of serum free light chains can be found in the CKD.  The ratio of kappa to lambda is acceptable up to approximately 3.6 based on guideline recommendations [facility]  Nephrol 21, 111 (2020)) -- She does have elevated protein in the urine but no evidence of monoclonal protein --Metastatic survey was negative for lytic lesions --No need for routine follow-up in hematology clinic unless new or worsening problems were to develop.  No orders of the defined types were placed in this encounter.  All questions were answered. The patient knows to call the clinic with any problems, questions or concerns.  A total of more than 25 minutes were spent on this encounter with face-to-face time and non-face-to-face time, including preparing to see the patient, ordering tests and/or medications, counseling the patient and coordination of care as outlined above.   Jessica Peoples, MD Department of Hematology/Oncology [place] [facility] Phone: [phone] Pager: [phone] Email:  [email or web address]  10/12/2020 11:29 AM

## 2020-10-12 NOTE — Progress Notes (Signed)
Order placed per Dr. Ernestina Penna note from 03/09/2020.

## 2020-10-14 ENCOUNTER — Ambulatory Visit: Payer: Medicare Other | Admitting: Internal Medicine

## 2020-10-14 LAB — UPEP/UIFE/LIGHT CHAINS/TP, 24-HR UR
% BETA, Urine: 15.6 %
ALPHA 1 URINE: 2.4 %
Albumin, U: 66.9 %
Alpha 2, Urine: 9 %
Free Kappa Lt Chains,Ur: 22.05 mg/L (ref 1.17–86.46)
Free Kappa/Lambda Ratio: 3.76 (ref 1.83–14.26)
Free Lambda Lt Chains,Ur: 5.86 mg/L (ref 0.27–15.21)
GAMMA GLOBULIN URINE: 6.1 %
Total Protein, Urine-Ur/day: 250 mg/24 hr — ABNORMAL HIGH (ref 30–150)
Total Protein, Urine: 31.3 mg/dL

## 2020-10-18 DIAGNOSIS — Z79891 Long term (current) use of opiate analgesic: Secondary | ICD-10-CM | POA: Diagnosis not present

## 2020-10-18 DIAGNOSIS — G894 Chronic pain syndrome: Secondary | ICD-10-CM | POA: Diagnosis not present

## 2020-10-18 DIAGNOSIS — M797 Fibromyalgia: Secondary | ICD-10-CM | POA: Diagnosis not present

## 2020-10-18 DIAGNOSIS — M130 Polyarthritis, unspecified: Secondary | ICD-10-CM | POA: Diagnosis not present

## 2020-10-24 ENCOUNTER — Other Ambulatory Visit: Payer: Self-pay

## 2020-10-24 ENCOUNTER — Inpatient Hospital Stay (HOSPITAL_COMMUNITY)
Admission: EM | Admit: 2020-10-24 | Discharge: 2020-10-27 | DRG: 683 | Disposition: A | Discharge status: Hospice | Payer: Medicare Other | Attending: Internal Medicine | Admitting: Internal Medicine

## 2020-10-24 ENCOUNTER — Encounter (HOSPITAL_COMMUNITY): Payer: Self-pay

## 2020-10-24 DIAGNOSIS — Z820 Family history of epilepsy and other diseases of the nervous system: Secondary | ICD-10-CM | POA: Diagnosis not present

## 2020-10-24 DIAGNOSIS — E785 Hyperlipidemia, unspecified: Secondary | ICD-10-CM | POA: Diagnosis not present

## 2020-10-24 DIAGNOSIS — R197 Diarrhea, unspecified: Secondary | ICD-10-CM | POA: Diagnosis not present

## 2020-10-24 DIAGNOSIS — I959 Hypotension, unspecified: Secondary | ICD-10-CM | POA: Diagnosis present

## 2020-10-24 DIAGNOSIS — Z8719 Personal history of other diseases of the digestive system: Secondary | ICD-10-CM

## 2020-10-24 DIAGNOSIS — R001 Bradycardia, unspecified: Secondary | ICD-10-CM | POA: Diagnosis not present

## 2020-10-24 DIAGNOSIS — M797 Fibromyalgia: Secondary | ICD-10-CM | POA: Diagnosis not present

## 2020-10-24 DIAGNOSIS — R627 Adult failure to thrive: Secondary | ICD-10-CM | POA: Diagnosis present

## 2020-10-24 DIAGNOSIS — I2510 Atherosclerotic heart disease of native coronary artery without angina pectoris: Secondary | ICD-10-CM | POA: Diagnosis not present

## 2020-10-24 DIAGNOSIS — Z809 Family history of malignant neoplasm, unspecified: Secondary | ICD-10-CM

## 2020-10-24 DIAGNOSIS — E871 Hypo-osmolality and hyponatremia: Secondary | ICD-10-CM | POA: Diagnosis not present

## 2020-10-24 DIAGNOSIS — Z8616 Personal history of COVID-19: Secondary | ICD-10-CM

## 2020-10-24 DIAGNOSIS — F418 Other specified anxiety disorders: Secondary | ICD-10-CM | POA: Diagnosis not present

## 2020-10-24 DIAGNOSIS — N179 Acute kidney failure, unspecified: Principal | ICD-10-CM

## 2020-10-24 DIAGNOSIS — E039 Hypothyroidism, unspecified: Secondary | ICD-10-CM | POA: Diagnosis present

## 2020-10-24 DIAGNOSIS — R54 Age-related physical debility: Secondary | ICD-10-CM | POA: Diagnosis present

## 2020-10-24 DIAGNOSIS — Z66 Do not resuscitate: Secondary | ICD-10-CM | POA: Diagnosis not present

## 2020-10-24 DIAGNOSIS — Z833 Family history of diabetes mellitus: Secondary | ICD-10-CM

## 2020-10-24 DIAGNOSIS — Z79890 Hormone replacement therapy (postmenopausal): Secondary | ICD-10-CM

## 2020-10-24 DIAGNOSIS — E860 Dehydration: Secondary | ICD-10-CM | POA: Diagnosis not present

## 2020-10-24 DIAGNOSIS — E872 Acidosis: Secondary | ICD-10-CM | POA: Diagnosis present

## 2020-10-24 DIAGNOSIS — I129 Hypertensive chronic kidney disease with stage 1 through stage 4 chronic kidney disease, or unspecified chronic kidney disease: Secondary | ICD-10-CM | POA: Diagnosis not present

## 2020-10-24 DIAGNOSIS — M1990 Unspecified osteoarthritis, unspecified site: Secondary | ICD-10-CM | POA: Diagnosis present

## 2020-10-24 DIAGNOSIS — E876 Hypokalemia: Secondary | ICD-10-CM | POA: Diagnosis not present

## 2020-10-24 DIAGNOSIS — N184 Chronic kidney disease, stage 4 (severe): Secondary | ICD-10-CM | POA: Diagnosis not present

## 2020-10-24 DIAGNOSIS — Z515 Encounter for palliative care: Secondary | ICD-10-CM | POA: Diagnosis not present

## 2020-10-24 DIAGNOSIS — G894 Chronic pain syndrome: Secondary | ICD-10-CM | POA: Diagnosis present

## 2020-10-24 DIAGNOSIS — K219 Gastro-esophageal reflux disease without esophagitis: Secondary | ICD-10-CM | POA: Diagnosis present

## 2020-10-24 DIAGNOSIS — Z8249 Family history of ischemic heart disease and other diseases of the circulatory system: Secondary | ICD-10-CM

## 2020-10-24 DIAGNOSIS — Z888 Allergy status to other drugs, medicaments and biological substances status: Secondary | ICD-10-CM

## 2020-10-24 DIAGNOSIS — Z743 Need for continuous supervision: Secondary | ICD-10-CM | POA: Diagnosis not present

## 2020-10-24 DIAGNOSIS — Z87891 Personal history of nicotine dependence: Secondary | ICD-10-CM

## 2020-10-24 DIAGNOSIS — R0902 Hypoxemia: Secondary | ICD-10-CM | POA: Diagnosis not present

## 2020-10-24 DIAGNOSIS — Z951 Presence of aortocoronary bypass graft: Secondary | ICD-10-CM

## 2020-10-24 DIAGNOSIS — Z6822 Body mass index (BMI) 22.0-22.9, adult: Secondary | ICD-10-CM

## 2020-10-24 DIAGNOSIS — J449 Chronic obstructive pulmonary disease, unspecified: Secondary | ICD-10-CM | POA: Diagnosis not present

## 2020-10-24 DIAGNOSIS — Z20822 Contact with and (suspected) exposure to covid-19: Secondary | ICD-10-CM | POA: Diagnosis not present

## 2020-10-24 DIAGNOSIS — N1832 Chronic kidney disease, stage 3b: Secondary | ICD-10-CM | POA: Diagnosis not present

## 2020-10-24 DIAGNOSIS — Z79899 Other long term (current) drug therapy: Secondary | ICD-10-CM

## 2020-10-24 DIAGNOSIS — Z7189 Other specified counseling: Secondary | ICD-10-CM | POA: Diagnosis not present

## 2020-10-24 DIAGNOSIS — Z90710 Acquired absence of both cervix and uterus: Secondary | ICD-10-CM

## 2020-10-24 DIAGNOSIS — I499 Cardiac arrhythmia, unspecified: Secondary | ICD-10-CM | POA: Diagnosis not present

## 2020-10-24 DIAGNOSIS — M1909 Primary osteoarthritis, other specified site: Secondary | ICD-10-CM | POA: Diagnosis not present

## 2020-10-24 DIAGNOSIS — Z9049 Acquired absence of other specified parts of digestive tract: Secondary | ICD-10-CM

## 2020-10-24 DIAGNOSIS — Z933 Colostomy status: Secondary | ICD-10-CM

## 2020-10-24 DIAGNOSIS — R6889 Other general symptoms and signs: Secondary | ICD-10-CM | POA: Diagnosis not present

## 2020-10-24 DIAGNOSIS — Z882 Allergy status to sulfonamides status: Secondary | ICD-10-CM

## 2020-10-24 LAB — CBC WITH DIFFERENTIAL/PLATELET
Abs Immature Granulocytes: 0.03 10*3/uL (ref 0.00–0.07)
Basophils Absolute: 0 10*3/uL (ref 0.0–0.1)
Basophils Relative: 1 %
Eosinophils Absolute: 0.1 10*3/uL (ref 0.0–0.5)
Eosinophils Relative: 1 %
HCT: 35.4 % — ABNORMAL LOW (ref 36.0–46.0)
Hemoglobin: 11.8 g/dL — ABNORMAL LOW (ref 12.0–15.0)
Immature Granulocytes: 0 %
Lymphocytes Relative: 29 %
Lymphs Abs: 2.1 10*3/uL (ref 0.7–4.0)
MCH: 29.9 pg (ref 26.0–34.0)
MCHC: 33.3 g/dL (ref 30.0–36.0)
MCV: 89.8 fL (ref 80.0–100.0)
Monocytes Absolute: 0.5 10*3/uL (ref 0.1–1.0)
Monocytes Relative: 6 %
Neutro Abs: 4.6 10*3/uL (ref 1.7–7.7)
Neutrophils Relative %: 63 %
Platelets: 207 10*3/uL (ref 150–400)
RBC: 3.94 MIL/uL (ref 3.87–5.11)
RDW: 13.2 % (ref 11.5–15.5)
WBC: 7.3 10*3/uL (ref 4.0–10.5)
nRBC: 0 % (ref 0.0–0.2)

## 2020-10-24 LAB — CREATININE, URINE, RANDOM: Creatinine, Urine: 40.69 mg/dL

## 2020-10-24 LAB — TSH: TSH: 1.466 u[IU]/mL (ref 0.350–4.500)

## 2020-10-24 LAB — COMPREHENSIVE METABOLIC PANEL
ALT: 10 U/L (ref 0–44)
AST: 17 U/L (ref 15–41)
Albumin: 3.4 g/dL — ABNORMAL LOW (ref 3.5–5.0)
Alkaline Phosphatase: 49 U/L (ref 38–126)
Anion gap: 17 — ABNORMAL HIGH (ref 5–15)
BUN: 176 mg/dL — ABNORMAL HIGH (ref 8–23)
CO2: 19 mmol/L — ABNORMAL LOW (ref 22–32)
Calcium: 8.8 mg/dL — ABNORMAL LOW (ref 8.9–10.3)
Chloride: 97 mmol/L — ABNORMAL LOW (ref 98–111)
Creatinine, Ser: 8.09 mg/dL — ABNORMAL HIGH (ref 0.44–1.00)
GFR, Estimated: 5 mL/min — ABNORMAL LOW (ref >60)
Glucose, Bld: 105 mg/dL — ABNORMAL HIGH (ref 70–99)
Potassium: 3.6 mmol/L (ref 3.5–5.1)
Sodium: 133 mmol/L — ABNORMAL LOW (ref 135–145)
Total Bilirubin: 1.1 mg/dL (ref 0.3–1.2)
Total Protein: 6.2 g/dL — ABNORMAL LOW (ref 6.5–8.1)

## 2020-10-24 LAB — URINALYSIS, ROUTINE W REFLEX MICROSCOPIC
Bilirubin Urine: NEGATIVE
Glucose, UA: NEGATIVE mg/dL
Ketones, ur: NEGATIVE mg/dL
Nitrite: NEGATIVE
Protein, ur: NEGATIVE mg/dL
Specific Gravity, Urine: 1.008 (ref 1.005–1.030)
pH: 6 (ref 5.0–8.0)

## 2020-10-24 LAB — SODIUM, URINE, RANDOM: Sodium, Ur: 101 mmol/L

## 2020-10-24 LAB — LACTIC ACID, PLASMA: Lactic Acid, Venous: 0.8 mmol/L (ref 0.5–1.9)

## 2020-10-24 MED ORDER — FAMOTIDINE 20 MG PO TABS: 20.0000 mg | ORAL_TABLET | Freq: Two times a day (BID) | ORAL | Status: DC

## 2020-10-24 MED ORDER — ACETAMINOPHEN 650 MG RE SUPP: 650.0000 mg | Freq: Four times a day (QID) | RECTAL | Status: DC | PRN

## 2020-10-24 MED ORDER — ONDANSETRON HCL 4 MG/2ML IJ SOLN
4.0000 mg | Freq: Four times a day (QID) | INTRAMUSCULAR | Status: DC | PRN
  Administered 2020-10-25 – 2020-10-26 (×4): 4 mg via INTRAVENOUS
  Filled 2020-10-24 (×4): qty 2

## 2020-10-24 MED ORDER — SODIUM CHLORIDE 0.9 % IV BOLUS
1000.0000 mL | Freq: Once | INTRAVENOUS | Status: AC
  Administered 2020-10-24: 1000 mL via INTRAVENOUS

## 2020-10-24 MED ORDER — HYDROMORPHONE HCL 1 MG/ML IJ SOLN
0.5000 mg | INTRAMUSCULAR | Status: DC | PRN
Start: 2020-10-24 — End: 2020-10-27
  Administered 2020-10-25 – 2020-10-27 (×4): 0.5 mg via INTRAVENOUS
  Filled 2020-10-24 (×4): qty 0.5

## 2020-10-24 MED ORDER — ALBUTEROL SULFATE HFA 108 (90 BASE) MCG/ACT IN AERS: 1.0000 | INHALATION_SPRAY | Freq: Four times a day (QID) | RESPIRATORY_TRACT | Status: DC | PRN

## 2020-10-24 MED ORDER — ACETAMINOPHEN 325 MG PO TABS
650.0000 mg | ORAL_TABLET | Freq: Four times a day (QID) | ORAL | Status: DC | PRN
  Administered 2020-10-25: 650 mg via ORAL
  Filled 2020-10-24 (×2): qty 2

## 2020-10-24 MED ORDER — LEVOTHYROXINE SODIUM 88 MCG PO TABS
88.0000 ug | ORAL_TABLET | Freq: Every day | ORAL | Status: DC
  Administered 2020-10-25 – 2020-10-27 (×3): 88 ug via ORAL
  Filled 2020-10-24 (×3): qty 1

## 2020-10-24 MED ORDER — ONDANSETRON HCL 4 MG PO TABS: 4.0000 mg | ORAL_TABLET | Freq: Four times a day (QID) | ORAL | Status: DC | PRN

## 2020-10-24 MED ORDER — LABETALOL HCL 5 MG/ML IV SOLN: 10.0000 mg | INTRAVENOUS | Status: DC | PRN

## 2020-10-24 MED ORDER — FAMOTIDINE 20 MG PO TABS
10.0000 mg | ORAL_TABLET | Freq: Every day | ORAL | Status: DC
  Administered 2020-10-24 – 2020-10-27 (×4): 10 mg via ORAL
  Filled 2020-10-24 (×4): qty 1

## 2020-10-24 MED ORDER — HEPARIN SODIUM (PORCINE) 5000 UNIT/ML IJ SOLN
5000.0000 [IU] | Freq: Three times a day (TID) | INTRAMUSCULAR | Status: DC
  Administered 2020-10-24 – 2020-10-27 (×9): 5000 [IU] via SUBCUTANEOUS
  Filled 2020-10-24 (×9): qty 1

## 2020-10-24 MED ORDER — SODIUM CHLORIDE 0.9 % IV SOLN: INTRAVENOUS | Status: DC

## 2020-10-24 NOTE — ED Notes (Signed)
Dr. Kathlynn Grate pt during triage. Verbal order 1066ml bolus NS.

## 2020-10-24 NOTE — ED Notes (Signed)
Second large amount of diarrhea  Noted.  Dr. aware.

## 2020-10-24 NOTE — ED Notes (Signed)
Patient attempted to provide urine sample, but was unable to pee at this time. Pt requested that we try again "in a little bit".
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
## 2020-10-24 NOTE — ED Triage Notes (Signed)
Pt arrived [facility] for dehydration, hypotension, decreased po intake x 1 week. Pt lives at home with husband.

## 2020-10-24 NOTE — H&P (Addendum)
History and Physical    [name] SPQ:[id number] DOB: 08-May-1935 DOA: 10/24/2020  PCP: Lindell Spar, MD   Patient coming from: Home  Chief Complaint: Poor oral intake x1 week  HPI: [name] is a 85 y.o. female with medical history significant for COPD, CAD, hypertension, dyslipidemia, CKD stage IV, fibromyalgia, and osteoarthritis who was brought to the ED due to not eating or drinking anything for the last 1 week and now has increased weakness.  She has had no abdominal pain, nausea, vomiting, diarrhea, fevers, chills, or cough.  She has had prior COVID vaccinations.  Son at bedside states that she is getting set up to see a nephrologist outpatient for advanced chronic kidney disease.  Patient has been taking her home medications routinely to include her diuretics.   ED Course: Vital signs stable and patient is afebrile.  Hemoglobin is 11.8, sodium 133, creatinine 8.09 and BUN 176.  Potassium is 3.6.  Patient has received 2 L fluid bolus in the ED.  Review of Systems: Reviewed as noted above, otherwise negative.  Past Medical History:  Diagnosis Date   Anxiety disorder, unspecified    Arteriosclerotic cardiovascular disease (ASCVD) 2006   Nonobstructive; < 50% lesions on cath 2002; negative stress nuclear study in 10/2004   Asthma    Chest pain, unspecified    Chronic kidney disease, unspecified    Chronic obstructive pulmonary disease, unspecified ([place])    Chronic pelvic pain in female    Colitis due to Clostridium difficile 1994   1994   COPD (chronic obstructive pulmonary disease) ([place])    Depression with anxiety    Diarrhea, unspecified    Disorder of thyroid, unspecified    Gastro-esophageal reflux disease without esophagitis    Gastroesophageal reflux disease    Hyperlipidemia    Hyperlipidemia, unspecified    Hypertension    Hypertensive chronic kidney disease with stage 1 through stage 4 chronic kidney disease, or unspecified chronic kidney disease     Hyperthyroidism    Hypothyroidism    Hypothyroidism, unspecified    Kidney disease, chronic, stage III (moderate, EGFR 30-59 ml/min) (HCC)    Lower abdominal pain, unspecified    Major depressive disorder, single episode, severe without psychotic features ([place])    Major depressive disorder, single episode, unspecified    Nausea    Other postherpetic nervous system involvement    Pelvic and perineal pain    Rash and other nonspecific skin eruption    S/P colonoscopy 2007   few diverticula, otherwise nl   S/P colonoscopy 12/13/10   rectal, cecal polyp, left-sided diverticulosis, hyperplastic. ?anal fissure? treated empirically   S/P endoscopy 2009   linear esophageal erosions   S/P endoscopy 05/10/10   Question island of salmon-colored epithelium in distal esophagus ; no Barrett's.    Shingles    Tobacco abuse, in remission    55-pack-year consumption; discontinued 08/2010   Unspecified asthma, uncomplicated    Unspecified osteoarthritis, unspecified site    Urinary tract infection, site not specified     Past Surgical History:  Procedure Laterality Date   ABDOMINAL HYSTERECTOMY     BREAST LUMPECTOMY     CATARACT EXTRACTION     CHOLECYSTECTOMY  1962   COLONOSCOPY  12/13/2010   Left-sided diverticulosis.  Cecal polyp, status post hot snare polypectomy/ Diminutive rectal polyp, status post cold biopsy removal tender/painful anal canal, ? occult anal fissure- Not visualized   COLONOSCOPY N/A 10/14/2015   Diverticulosis   ESOPHAGOGASTRODUODENOSCOPY  05/10/10  benign mucosa with mild chronic inflammation.   ESOPHAGOGASTRODUODENOSCOPY (EGD) WITH PROPOFOL N/A 02/13/2019   Procedure: ESOPHAGOGASTRODUODENOSCOPY (EGD) WITH PROPOFOL;  Surgeon: Daneil Dolin, MD;  Location: [facility];  Service: Endoscopy;  Laterality: N/A;  3:00pm   FLEXIBLE SIGMOIDOSCOPY  12/29/2011   Procedure: FLEXIBLE SIGMOIDOSCOPY;  Surgeon: Rogene Houston, MD;  Location: [facility];  Service: Endoscopy;   Laterality: N/A;  200-Ann notified pt to be here @ 1:66   NISSEN FUNDOPLICATION     YAG LASER APPLICATION Left 0/63/0160   Procedure: YAG LASER APPLICATION;  Surgeon: Williams Che, MD;  Location: [facility];  Service: Ophthalmology;  Laterality: Left;     reports that she quit smoking about 10 years ago. Her smoking use included cigarettes. She has a 55.00 pack-year smoking history. She has never used smokeless tobacco. She reports that she does not drink alcohol and does not use drugs.  Allergies  Allergen Reactions   Hydralazine Other (See Comments)    Breathing   Nalbuphine Rash   Paroxetine Itching   Sulfa Antibiotics Other (See Comments)    Childhood reaction.   Venlafaxine Itching    Family History  Problem Relation Age of Onset   Diabetes Mother    Alzheimer's disease Mother    Cancer Mother    Heart disease Father    Cancer Sister    Heart disease Brother        Also mother, Father, brother and 2 sons   Cancer Son        testicular cancer    Aneurysm Son    Diabetes Son    Hypertension Brother        also Sister x2   Colon cancer Neg Hx     Prior to Admission medications   Medication Sig Start Date End Date Taking? Authorizing Provider  albuterol (VENTOLIN HFA) 108 (90 Base) MCG/ACT inhaler Inhale 1-2 puffs into the lungs every 6 (six) hours as needed for wheezing or shortness of breath. 06/14/20  Yes Lindell Spar, MD  amLODipine (NORVASC) 5 MG tablet Take 1 tablet (5 mg total) by mouth daily. 08/03/20  Yes Lindell Spar, MD  atorvastatin (LIPITOR) 20 MG tablet Take 20 mg every evening by mouth.    Yes [provider]  carvedilol (COREG) 6.25 MG tablet TAKE (1) TABLET BY MOUTH TWICE DAILY. Patient taking differently: Take 6.25 mg by mouth 2 (two) times daily. 08/20/20  Yes Strader, [place] M, PA-C  chlorthalidone (HYGROTON) 25 MG tablet Take 1 tablet (25 mg total) by mouth daily. 08/03/20  Yes Lindell Spar, MD  famotidine (PEPCID) 20 MG tablet Take 1  tablet (20 mg total) by mouth 2 (two) times daily. 08/21/20  Yes Noemi Chapel, MD  furosemide (LASIX) 40 MG tablet Take 40 mg by mouth daily.   Yes [provider]  gabapentin (NEURONTIN) 100 MG capsule TAKE 1 CAPSULE BY MOUTH THREE TIMES A DAY. Patient taking differently: Take 100 mg by mouth 3 (three) times daily. 08/18/19  Yes Carole Civil, MD  hydrochlorothiazide (HYDRODIURIL) 25 MG tablet Take 25 mg by mouth daily. 08/20/20  Yes [provider]  HYDROcodone-acetaminophen (NORCO) 7.5-325 MG tablet Take 1 tablet by mouth 4 (four) times daily as needed for pain. 08/30/20  Yes [provider]  levothyroxine (SYNTHROID) 88 MCG tablet Take 1 tablet (88 mcg total) by mouth daily before breakfast. 06/16/20  Yes Lindell Spar, MD  LINZESS 145 MCG CAPS capsule TAKE 1 CAPSULE ONCE  DAILY BEFORE BREAKFAST. Patient taking differently: Take 145 mcg by mouth daily before breakfast. 08/06/19  Yes Mahala Menghini, PA-C  lisinopril (ZESTRIL) 10 MG tablet Take 10 mg by mouth at bedtime. 09/09/20  Yes [provider]  Multiple Vitamins-Minerals (EYE VITAMINS PO) Take 1 tablet by mouth daily.    Yes [provider]  ondansetron (ZOFRAN) 4 MG tablet TAKE 1 TABLET BY MOUTH THREE TIMES AS NEEDED FOR NAUSEA. Patient taking differently: Take 4 mg by mouth 3 (three) times daily as needed for nausea or vomiting. 09/17/20  Yes Lindell Spar, MD  pantoprazole (PROTONIX) 40 MG tablet TAKE (1) TABLET BY MOUTH TWICE DAILY. Patient taking differently: Take 40 mg by mouth 2 (two) times daily. 10/13/20  Yes Patel, Colin Broach, MD  permethrin (ELIMITE) 5 % cream WASH HAIR WITH REGULAR SHAMPOO, THEN RINSE AND TOWEL DRY HAIR. SATURATE HAIR AND SCALP ESPECIALLY BEHIND EARS, LEAVE IN FOR 10 MINTUES, THEN RINSE WITH WATER. Patient taking differently: Apply 1 application topically daily. 09/29/20  Yes Lindell Spar, MD  potassium chloride SA (K-DUR) 20 MEQ tablet Take 20 mEq by mouth daily with  lunch.  09/17/18  Yes [provider]  sucralfate (CARAFATE) 1 g tablet Take 1 tablet (1 g total) by mouth 4 (four) times daily -  with meals and at bedtime. 08/21/20  Yes Noemi Chapel, MD  DULoxetine (CYMBALTA) 30 MG capsule Take 1 capsule (30 mg total) by mouth daily. Patient not taking: Reported on 10/24/2020 12/22/19 03/21/20  Lindell Spar, MD  DULoxetine (CYMBALTA) 60 MG capsule Take 60 mg by mouth daily. 10/05/20   [provider]  HYDROcodone-acetaminophen (NORCO) 10-325 MG tablet Take 0.5-2 tablets by mouth every 4 (four) hours as needed (1/2 tablet for mild pain, 1 tablet for moderate pain, 2 tablets for severe pain). Patient not taking: No sig reported 05/29/15   Erby Pian, NP    Physical Exam: Vitals:   10/24/20 1157 10/24/20 1307 10/24/20 1415 10/24/20 1430  BP:  123/63 117/72 128/65  Pulse:  (!) 59 (!) 58 (!) 57  Resp:  _0 Temp:      TempSrc:      SpO2: 98% 100% 97% 98%  Weight:      Height:        Constitutional: NAD, calm, comfortable, oral mucosa dry Vitals:   10/24/20 1157 10/24/20 1307 10/24/20 1415 10/24/20 1430  BP:  123/63 117/72 128/65  Pulse:  (!) 59 (!) 58 (!) 57  Resp:  _1 Temp:      TempSrc:      SpO2: 98% 100% 97% 98%  Weight:      Height:       Eyes: lids and conjunctivae normal Neck: normal, supple Respiratory: clear to auscultation bilaterally. Normal respiratory effort. No accessory muscle use.  Cardiovascular: Regular rate and rhythm, no murmurs. Abdomen: no tenderness, no distention. Bowel sounds positive.  Musculoskeletal:  No edema. Skin: no rashes, lesions, ulcers.  Psychiatric: Flat affect  Labs on Admission: I have personally reviewed following labs and imaging studies  CBC: Recent Labs  Lab 10/24/20 1204  WBC 7.3  NEUTROABS 4.6  HGB 11.8*  HCT 35.4*  MCV 89.8  PLT 798   Basic Metabolic Panel: Recent Labs  Lab 10/24/20 1204  NA 133*  K 3.6  CL 97*  CO2 19*  GLUCOSE 105*  BUN 176*   CREATININE 8.09*  CALCIUM 8.8*   GFR: Estimated Creatinine Clearance: 3.9 mL/min (  A) (by C-G formula based on SCr of 8.09 mg/dL (H)). Liver Function Tests: Recent Labs  Lab 10/24/20 1204  AST 17  ALT 10  ALKPHOS 49  BILITOT 1.1  PROT 6.2*  ALBUMIN 3.4*   No results for input(s): LIPASE, AMYLASE in the last 168 hours. No results for input(s): AMMONIA in the last 168 hours. Coagulation Profile: No results for input(s): INR, PROTIME in the last 168 hours. Cardiac Enzymes: No results for input(s): CKTOTAL, CKMB, CKMBINDEX, TROPONINI in the last 168 hours. BNP (last 3 results) No results for input(s): PROBNP in the last 8760 hours. HbA1C: No results for input(s): HGBA1C in the last 72 hours. CBG: No results for input(s): GLUCAP in the last 168 hours. Lipid Profile: No results for input(s): CHOL, HDL, LDLCALC, TRIG, CHOLHDL, LDLDIRECT in the last 72 hours. Thyroid Function Tests: No results for input(s): TSH, T4TOTAL, FREET4, T3FREE, THYROIDAB in the last 72 hours. Anemia Panel: No results for input(s): VITAMINB12, FOLATE, FERRITIN, TIBC, IRON, RETICCTPCT in the last 72 hours. Urine analysis:    Component Value Date/Time   COLORURINE AMBER (A) 12/01/2019 1454   APPEARANCEUR CLOUDY (A) 12/01/2019 1454   LABSPEC 1.016 12/01/2019 1454   PHURINE 5.0 12/01/2019 1454   GLUCOSEU NEGATIVE 12/01/2019 1454   HGBUR NEGATIVE 12/01/2019 1454   BILIRUBINUR negative 12/22/2019 1115   KETONESUR negative 12/22/2019 1115   KETONESUR NEGATIVE 12/01/2019 1454   PROTEINUR >=300 (A) 12/01/2019 1454   UROBILINOGEN 0.2 12/22/2019 1115   UROBILINOGEN 0.2 09/24/2014 1015   NITRITE Positive (A) 12/22/2019 1115   NITRITE NEGATIVE 12/01/2019 1454   LEUKOCYTESUR Small (1+) (A) 12/22/2019 1115   LEUKOCYTESUR MODERATE (A) 12/01/2019 1454    Radiological Exams on Admission: No results found.  EKG: Sinus bradycardia 57 bpm.  Assessment/Plan Active Problems:   AKI (acute kidney injury) ([place])     AKI on CKD stage IV-suspect prerenal -Patient appears to be on chlorthalidone as well as potentially Lasix and now has had poor oral intake -Baseline creatinine around 3-4 and currently at 8.09 -Monitor strict I's and O's -Urine electrolytes -2 L bolus given in ED, continue IV normal saline -Patient refuses to have hemodialysis initiated-will consult palliative -Consider renal ultrasound if unimproved with IV fluid -Appreciate nephrology evaluation-in am  -COVID test pending  Elevated serum free light chains -Likely related to CKD -No further hematology follow-up recommended -Metastatic survey negative for lytic lesions  History of CAD/dyslipidemia -Hold carvedilol given lower heart rates -Continue statin  History of hypertension -Hold amlodipine and diuretics -Hold carvedilol given some bradycardia  History of COPD -No acute bronchospasms noted -Breathing treatments as needed  History of hypothyroidism -Continue levothyroxine -Check TSH  History of fibromyalgia -Hold Cymbalta for now  History of osteoarthritis -Tylenol for mild to moderate pain -Dilaudid for severe pain  GERD -PPI   DVT prophylaxis: Heparin Code Status: DNR Family Communication: Son at bedside Disposition Plan:Admit for AKI evaluation Consults called:Nephrology, Palliative Admission status: Inpatient, Tele  Egor Fullilove D Samiyah Stupka DO [facility]  If 7PM-7AM, please contact night-coverage [email or web address]  10/24/2020, 2:55 PM

## 2020-10-24 NOTE — ED Notes (Signed)
Encouraging pt to drink  strawberry ensure 8oz

## 2020-10-24 NOTE — ED Notes (Signed)
Pt denies pain and states she just hasn't been hungry.

## 2020-10-25 LAB — COMPREHENSIVE METABOLIC PANEL
ALT: 11 U/L (ref 0–44)
AST: 23 U/L (ref 15–41)
Albumin: 3.3 g/dL — ABNORMAL LOW (ref 3.5–5.0)
Alkaline Phosphatase: 46 U/L (ref 38–126)
Anion gap: 17 — ABNORMAL HIGH (ref 5–15)
BUN: 147 mg/dL — ABNORMAL HIGH (ref 8–23)
CO2: 18 mmol/L — ABNORMAL LOW (ref 22–32)
Calcium: 9.2 mg/dL (ref 8.9–10.3)
Chloride: 105 mmol/L (ref 98–111)
Creatinine, Ser: 6.1 mg/dL — ABNORMAL HIGH (ref 0.44–1.00)
GFR, Estimated: 6 mL/min — ABNORMAL LOW (ref >60)
Glucose, Bld: 94 mg/dL (ref 70–99)
Potassium: 3.3 mmol/L — ABNORMAL LOW (ref 3.5–5.1)
Sodium: 140 mmol/L (ref 135–145)
Total Bilirubin: 1 mg/dL (ref 0.3–1.2)
Total Protein: 6.3 g/dL — ABNORMAL LOW (ref 6.5–8.1)

## 2020-10-25 LAB — GASTROINTESTINAL PANEL BY PCR, STOOL (REPLACES STOOL CULTURE)

## 2020-10-25 LAB — CBC
HCT: 34.4 % — ABNORMAL LOW (ref 36.0–46.0)
Hemoglobin: 11.4 g/dL — ABNORMAL LOW (ref 12.0–15.0)
MCH: 29.2 pg (ref 26.0–34.0)
MCHC: 33.1 g/dL (ref 30.0–36.0)
MCV: 88.2 fL (ref 80.0–100.0)
Platelets: 199 10*3/uL (ref 150–400)
RBC: 3.9 MIL/uL (ref 3.87–5.11)
RDW: 13.3 % (ref 11.5–15.5)
WBC: 6 10*3/uL (ref 4.0–10.5)
nRBC: 0 % (ref 0.0–0.2)

## 2020-10-25 LAB — SARS CORONAVIRUS 2 (TAT 6-24 HRS): SARS Coronavirus 2: NEGATIVE

## 2020-10-25 LAB — MAGNESIUM: Magnesium: 1.9 mg/dL (ref 1.7–2.4)

## 2020-10-25 MED ORDER — ADULT MULTIVITAMIN W/MINERALS CH
1.0000 | ORAL_TABLET | Freq: Every day | ORAL | Status: DC
  Administered 2020-10-25 – 2020-10-27 (×3): 1 via ORAL
  Filled 2020-10-25 (×3): qty 1

## 2020-10-25 MED ORDER — POTASSIUM CHLORIDE CRYS ER 20 MEQ PO TBCR
20.0000 meq | EXTENDED_RELEASE_TABLET | Freq: Once | ORAL | Status: AC
  Administered 2020-10-25: 20 meq via ORAL
  Filled 2020-10-25: qty 1

## 2020-10-25 MED ORDER — MELATONIN 3 MG PO TABS
3.0000 mg | ORAL_TABLET | Freq: Every day | ORAL | Status: DC
  Administered 2020-10-25 – 2020-10-26 (×2): 3 mg via ORAL
  Filled 2020-10-25: qty 1

## 2020-10-25 NOTE — Progress Notes (Signed)
PROGRESS NOTE    KAY SHIPPY  ZOX:[id number] DOB: 20-Apr-1935 DOA: 10/24/2020 PCP: Lindell Spar, MD   Brief Narrative:   [name] is a 85 y.o. female with medical history significant for COPD, CAD, hypertension, dyslipidemia, CKD stage IV, fibromyalgia, and osteoarthritis who was brought to the ED due to not eating or drinking anything for the last 1 week and now has increased weakness.  Patient was admitted for AKI on CKD stage IV as well as suspected failure to thrive with palliative consultation pending.  Continues to remain on IV fluid with creatinine levels improving.  Assessment & Plan:   Active Problems:   AKI (acute kidney injury) ([place])   AKI on CKD stage IV-suspect prerenal; improving -Patient appears to be on chlorthalidone as well as potentially Lasix and now has had poor oral intake -Baseline creatinine around 3-4 and currently at 6.1 -Monitor strict I's and O's -Urine electrolytes -2 L bolus given in ED, continue IV normal saline -Patient refuses to have hemodialysis initiated-will consult palliative -COVID test pending  Mild hypokalemia -Replete and re-evaluate -May be related to diarrhea  Diarrhea -GI panel pending -Continue IVF hydration   Elevated serum free light chains -Likely related to CKD -No further hematology follow-up recommended -Metastatic survey negative for lytic lesions   History of CAD/dyslipidemia -Hold carvedilol given lower heart rates -Continue statin   History of hypertension -Hold amlodipine and diuretics -Hold carvedilol given some bradycardia   History of COPD -No acute bronchospasms noted -Breathing treatments as needed   History of hypothyroidism -Continue levothyroxine -Check TSH   History of fibromyalgia -Hold Cymbalta for now   History of osteoarthritis -Tylenol for mild to moderate pain -Dilaudid for severe pain   GERD -PPI     DVT prophylaxis: Heparin Code Status: DNR Family Communication:  Spoke with son at bedside 7/31 Disposition Plan:  Status is: Inpatient  Remains inpatient appropriate because:IV treatments appropriate due to intensity of illness or inability to take PO and Inpatient level of care appropriate due to severity of illness  Dispo: The patient is from: Home              Anticipated d/c is to: Home              Patient currently is not medically stable to d/c.   Difficult to place patient No  Consultants:  Palliative  Procedures:  See below  Antimicrobials:  None   Subjective: Patient seen and evaluated today with no new acute complaints or concerns. No acute concerns or events noted overnight.  No further bowel movements noted overnight and she appears to be somewhat somnolent today.  Objective: Vitals:   10/24/20 1853 10/24/20 2106 10/25/20 0300 10/25/20 0607  BP: 133/73 129/60 127/66 130/67  Pulse: 60 61 71 65  Resp:  17 20 16   Temp:  97.6 F (36.4 C) 98 F (36.7 C) 97.9 F (36.6 C)  TempSrc:  Oral Oral Oral  SpO2: 99% 98% 99% 98%  Weight:      Height:        Intake/Output Summary (Last 24 hours) at 10/25/2020 1226 Last data filed at 10/24/2020 1613 Gross per 24 hour  Intake 2182.6 ml  Output --  Net 2182.6 ml   Filed Weights   10/24/20 1148  Weight: 52.9 kg    Examination:  General exam: Appears calm and comfortable, somnolent but arousable Respiratory system: Clear to auscultation. Respiratory effort normal. Cardiovascular system: S1 & S2 heard, RRR.  Gastrointestinal  system: Abdomen is soft Central nervous system: Alert and awake Extremities: No edema Skin: No significant lesions noted Psychiatry: Flat affect.    Data Reviewed: I have personally reviewed following labs and imaging studies  CBC: Recent Labs  Lab 10/24/20 1204 10/25/20 0534  WBC 7.3 6.0  NEUTROABS 4.6  --   HGB 11.8* 11.4*  HCT 35.4* 34.4*  MCV 89.8 88.2  PLT 207 390   Basic Metabolic Panel: Recent Labs  Lab 10/24/20 1204 10/25/20 0534   NA 133* 140  K 3.6 3.3*  CL 97* 105  CO2 19* 18*  GLUCOSE 105* 94  BUN 176* 147*  CREATININE 8.09* 6.10*  CALCIUM 8.8* 9.2  MG  --  1.9   GFR: Estimated Creatinine Clearance: 5.1 mL/min (A) (by C-G formula based on SCr of 6.1 mg/dL (H)). Liver Function Tests: Recent Labs  Lab 10/24/20 1204 10/25/20 0534  AST 17 23  ALT 10 11  ALKPHOS 49 46  BILITOT 1.1 1.0  PROT 6.2* 6.3*  ALBUMIN 3.4* 3.3*   No results for input(s): LIPASE, AMYLASE in the last 168 hours. No results for input(s): AMMONIA in the last 168 hours. Coagulation Profile: No results for input(s): INR, PROTIME in the last 168 hours. Cardiac Enzymes: No results for input(s): CKTOTAL, CKMB, CKMBINDEX, TROPONINI in the last 168 hours. BNP (last 3 results) No results for input(s): PROBNP in the last 8760 hours. HbA1C: No results for input(s): HGBA1C in the last 72 hours. CBG: No results for input(s): GLUCAP in the last 168 hours. Lipid Profile: No results for input(s): CHOL, HDL, LDLCALC, TRIG, CHOLHDL, LDLDIRECT in the last 72 hours. Thyroid Function Tests: Recent Labs    10/24/20 1526  TSH 1.466   Anemia Panel: No results for input(s): VITAMINB12, FOLATE, FERRITIN, TIBC, IRON, RETICCTPCT in the last 72 hours. Sepsis Labs: Recent Labs  Lab 10/24/20 1315  LATICACIDVEN 0.8    No results found for this or any previous visit (from the past 240 hour(s)).       Radiology Studies: No results found.      Scheduled Meds:  famotidine  10 mg Oral Daily   heparin  5,000 Units Subcutaneous Q8H   levothyroxine  88 mcg Oral QAC breakfast   Continuous Infusions:  sodium chloride 100 mL/hr at 10/25/20 0842     LOS: 1 day    Time spent: 35 minutes    Lincon Sahlin Darleen Crocker, DO [facility]  If 7PM-7AM, please contact night-coverage [email or web address] 10/25/2020, 12:26 PM

## 2020-10-25 NOTE — Progress Notes (Addendum)
Initial Nutrition Assessment  DOCUMENTATION CODES:      INTERVENTION:  Magic cup TID with meals, each supplement provides 290 kcal and 9 grams of protein   Multivitamin daily  Recommend liberalize to Soft diet   NUTRITION DIAGNOSIS:   Inadequate oral intake related to acute illness, decreased appetite as evidenced by per patient/family report, percent weight loss (meal intake 5% today per documentation).   GOAL:  Patient will meet greater than or equal to 90% of their needs  MONITOR:  PO intake, Supplement acceptance, Labs, Weight trends  REASON FOR ASSESSMENT:   Malnutrition Screening Tool    ASSESSMENT: Patient is a 85 yo female with hx of CKD stage IV, COPD, CAD, fibromyalgia. Poor oral intake 1 week prior to admission per chart review.   Presents with AKI on CKD-4, suspected failure to thrive in adult. Palliative team consulted.   Patient family member bedside and says he frequently picks up food for the patient. She likes Licensed conveyancer veggies: cabbage, green beans, mashed potatoes and gravy. Patient likes fruit cups and is independent with feeding.   Patient drinks water, coffee at breakfast and occasional cola. We discussed the importance of adequate nutrition, hydration. Family member expressed concern that patient would go home and not be able to eat and/or drink well enough. Education about nutrition requirements provided. Energy, protein and fluids.  Acute significant weight loss per chart review- 5.5 kg (9%) < 1 month. Unable to complete physical exam today, will perform during follow up. Suspect malnutrition given her AKI on Chronic CKD, weight loss and poor oral intake prior to admission.   Medications reviewed and include: Pepcid,Synthroid IVF-NS@ 100 ml/hr  Labs: BMP Latest Ref Rng & Units 10/25/2020 10/24/2020 09/29/2020  Glucose 70 - 99 mg/dL 94 105(H) 111(H)  BUN 8 - 23 mg/dL 147(H) 176(H) 77(HH)  Creatinine 0.44 - 1.00 mg/dL 6.10(H) 8.09(H) 4.52(H)   BUN/Creat Ratio 12 - 28 - - 17  Sodium 135 - 145 mmol/L 140 133(L) 137  Potassium 3.5 - 5.1 mmol/L 3.3(L) 3.6 4.3  Chloride 98 - 111 mmol/L 105 97(L) 96  CO2 22 - 32 mmol/L 18(L) 19(L) 20  Calcium 8.9 - 10.3 mg/dL 9.2 8.8(L) 9.3      NUTRITION - FOCUSED PHYSICAL EXAM: Unable to complete Nutrition-Focused physical exam at this time.    Diet Order:   Diet Order             Diet Heart Room service appropriate? Yes; Fluid consistency: Thin  Diet effective now                   EDUCATION NEEDS:  Education needs have been addressed  Skin:  Skin Assessment: Reviewed RN Assessment  Last BM:  7/31 type 7  Height:   Ht Readings from Last 1 Encounters:  10/24/20 5' (1.524 m)    Weight:   Wt Readings from Last 1 Encounters:  10/24/20 52.9 kg    Ideal Body Weight:   45 kg  BMI:  Body mass index is 22.77 kg/m.  Estimated Nutritional Needs:   Kcal:  1500-1650  Protein:  37-42 gr  Fluid:  >1300 ml daily   Colman Cater MS,RD,CSG,LDN Contact: Shea Evans

## 2020-10-26 ENCOUNTER — Encounter (HOSPITAL_COMMUNITY): Payer: Self-pay | Admitting: Internal Medicine

## 2020-10-26 DIAGNOSIS — E860 Dehydration: Secondary | ICD-10-CM

## 2020-10-26 DIAGNOSIS — Z515 Encounter for palliative care: Secondary | ICD-10-CM

## 2020-10-26 DIAGNOSIS — Z7189 Other specified counseling: Secondary | ICD-10-CM

## 2020-10-26 LAB — POCT I-STAT, CHEM 8
BUN: >130 mg/dL — ABNORMAL HIGH (ref 8–23)
Calcium, Ion: 1.02 mmol/L — ABNORMAL LOW (ref 1.15–1.40)
Chloride: 97 mmol/L — ABNORMAL LOW (ref 98–111)
Creatinine, Ser: 8.9 mg/dL — ABNORMAL HIGH (ref 0.44–1.00)
Glucose, Bld: 101 mg/dL — ABNORMAL HIGH (ref 70–99)
HCT: 35 % — ABNORMAL LOW (ref 36.0–46.0)
Hemoglobin: 11.9 g/dL — ABNORMAL LOW (ref 12.0–15.0)
Potassium: 3.6 mmol/L (ref 3.5–5.1)
Sodium: 132 mmol/L — ABNORMAL LOW (ref 135–145)
TCO2: 18 mmol/L — ABNORMAL LOW (ref 22–32)

## 2020-10-26 LAB — BASIC METABOLIC PANEL
Anion gap: 12 (ref 5–15)
BUN: 117 mg/dL — ABNORMAL HIGH (ref 8–23)
CO2: 18 mmol/L — ABNORMAL LOW (ref 22–32)
Calcium: 8.9 mg/dL (ref 8.9–10.3)
Chloride: 109 mmol/L (ref 98–111)
Creatinine, Ser: 4.55 mg/dL — ABNORMAL HIGH (ref 0.44–1.00)
GFR, Estimated: 9 mL/min — ABNORMAL LOW (ref >60)
Glucose, Bld: 91 mg/dL (ref 70–99)
Potassium: 3 mmol/L — ABNORMAL LOW (ref 3.5–5.1)
Sodium: 139 mmol/L (ref 135–145)

## 2020-10-26 LAB — MAGNESIUM: Magnesium: 1.9 mg/dL (ref 1.7–2.4)

## 2020-10-26 MED ORDER — POTASSIUM CHLORIDE CRYS ER 20 MEQ PO TBCR
40.0000 meq | EXTENDED_RELEASE_TABLET | Freq: Once | ORAL | Status: AC
  Administered 2020-10-26: 40 meq via ORAL
  Filled 2020-10-26: qty 2

## 2020-10-26 MED ORDER — POTASSIUM CHLORIDE IN NACL 20-0.9 MEQ/L-% IV SOLN: INTRAVENOUS | Status: DC

## 2020-10-26 MED ORDER — SODIUM CHLORIDE 0.9 % IV SOLN: INTRAVENOUS | Status: DC

## 2020-10-26 NOTE — Progress Notes (Signed)
PROGRESS NOTE    [name]  OAC:[id number] DOB: 04/10/1935 DOA: 10/24/2020 PCP: Lindell Spar, MD   Brief Narrative:   [name] is a 85 y.o. female with medical history significant for COPD, CAD, hypertension, dyslipidemia, CKD stage IV, fibromyalgia, and osteoarthritis who was brought to the ED due to not eating or drinking anything for the last 1 week and now has increased weakness.  Patient was admitted for AKI on CKD stage IV as well as suspected failure to thrive with palliative consultation pending.  Continues to remain on IV fluid with creatinine levels improving.  She continues not to have any p.o. intake.  Assessment & Plan:   Active Problems:   AKI (acute kidney injury) ([place])   AKI on CKD stage IV-suspect prerenal; improving -Patient appears to be on chlorthalidone as well as potentially Lasix and now has had poor oral intake -Baseline creatinine around 3-4 and currently at 4.55 -Monitor strict I's and O's, urine output 950 mL documented in the last 24 hours - continue IV normal saline as ordered -Patient refuses to have hemodialysis initiated-will consult palliative -COVID test negative  Failure to thrive -Continues to have poor p.o. intake -Limited social interactions -Appreciate palliative consultation with ongoing family discussions for home hospice versus residential hospice   Mild hypokalemia -Replete and re-evaluate -May be related to diarrhea   Diarrhea-resolved -GI panel negative -Continue IVF hydration   Elevated serum free light chains -Likely related to CKD -No further hematology follow-up recommended -Metastatic survey negative for lytic lesions   History of CAD/dyslipidemia -Hold carvedilol given lower heart rates -Continue statin   History of hypertension -Hold amlodipine and diuretics -Hold carvedilol given some bradycardia   History of COPD -No acute bronchospasms noted -Breathing treatments as needed   History of  hypothyroidism -Continue levothyroxine -TSH 1.466   History of fibromyalgia -Hold Cymbalta for now   History of osteoarthritis -Tylenol for mild to moderate pain -Dilaudid for severe pain   GERD -PPI       DVT prophylaxis: Heparin Code Status: DNR Family Communication: Spoke with son at bedside 8/2 Disposition Plan:  Status is: Inpatient   Remains inpatient appropriate because:IV treatments appropriate due to intensity of illness or inability to take PO and Inpatient level of care appropriate due to severity of illness   Dispo: The patient is from: Home              Anticipated d/c is to: Home hospice vs residential hospice              Patient currently is not medically stable to d/c.              Difficult to place patient No   Consultants:  Palliative   Procedures:  See below   Antimicrobials:  None  Subjective: Patient seen and evaluated today with ongoing disinterest in eating.  Son at bedside.  Objective: Vitals:   10/25/20 0607 10/25/20 1244 10/25/20 2043 10/26/20 0442  BP: 130/67 121/72 118/63 140/68  Pulse: 65 69 62 68  Resp: 16 18 14 14   Temp: 97.9 F (36.6 C) 97.8 F (36.6 C) 98 F (36.7 C) 98.4 F (36.9 C)  TempSrc: Oral Oral Oral Oral  SpO2: 98% 99% 100% 99%  Weight:      Height:        Intake/Output Summary (Last 24 hours) at 10/26/2020 1218 Last data filed at 10/26/2020 0455 Gross per 24 hour  Intake 1987.62 ml  Output 950 ml  Net 1037.62 ml   Filed Weights   10/24/20 1148  Weight: 52.9 kg    Examination:  General exam: Appears calm and comfortable, elderly/frail Respiratory system: Clear to auscultation. Respiratory effort normal. Cardiovascular system: S1 & S2 heard, RRR.  Gastrointestinal system: Abdomen is soft Central nervous system: Alert and awake Extremities: No edema Skin: No significant lesions noted Psychiatry: Flat affect.    Data Reviewed: I have personally reviewed following labs and imaging  studies  CBC: Recent Labs  Lab 10/24/20 1204 10/24/20 1209 10/25/20 0534  WBC 7.3  --  6.0  NEUTROABS 4.6  --   --   HGB 11.8* 11.9* 11.4*  HCT 35.4* 35.0* 34.4*  MCV 89.8  --  88.2  PLT 207  --  093   Basic Metabolic Panel: Recent Labs  Lab 10/24/20 1204 10/24/20 1209 10/25/20 0534 10/26/20 0442  NA 133* 132* 140 139  K 3.6 3.6 3.3* 3.0*  CL 97* 97* 105 109  CO2 19*  --  18* 18*  GLUCOSE 105* 101* 94 91  BUN 176* >130* 147* 117*  CREATININE 8.09* 8.90* 6.10* 4.55*  CALCIUM 8.8*  --  9.2 8.9  MG  --   --  1.9 1.9   GFR: Estimated Creatinine Clearance: 6.8 mL/min (A) (by C-G formula based on SCr of 4.55 mg/dL (H)). Liver Function Tests: Recent Labs  Lab 10/24/20 1204 10/25/20 0534  AST 17 23  ALT 10 11  ALKPHOS 49 46  BILITOT 1.1 1.0  PROT 6.2* 6.3*  ALBUMIN 3.4* 3.3*   No results for input(s): LIPASE, AMYLASE in the last 168 hours. No results for input(s): AMMONIA in the last 168 hours. Coagulation Profile: No results for input(s): INR, PROTIME in the last 168 hours. Cardiac Enzymes: No results for input(s): CKTOTAL, CKMB, CKMBINDEX, TROPONINI in the last 168 hours. BNP (last 3 results) No results for input(s): PROBNP in the last 8760 hours. HbA1C: No results for input(s): HGBA1C in the last 72 hours. CBG: No results for input(s): GLUCAP in the last 168 hours. Lipid Profile: No results for input(s): CHOL, HDL, LDLCALC, TRIG, CHOLHDL, LDLDIRECT in the last 72 hours. Thyroid Function Tests: Recent Labs    10/24/20 1526  TSH 1.466   Anemia Panel: No results for input(s): VITAMINB12, FOLATE, FERRITIN, TIBC, IRON, RETICCTPCT in the last 72 hours. Sepsis Labs: Recent Labs  Lab 10/24/20 1315  LATICACIDVEN 0.8    Recent Results (from the past 240 hour(s))  SARS CORONAVIRUS 2 (TAT 6-24 HRS) Nasopharyngeal Nasopharyngeal Swab     Status: None   Collection Time: 10/24/20  1:50 PM   Specimen: Nasopharyngeal Swab  Result Value Ref Range Status   SARS  Coronavirus 2 NEGATIVE NEGATIVE Final    Comment: (NOTE) SARS-CoV-2 target nucleic acids are NOT DETECTED.  The SARS-CoV-2 RNA is generally detectable in upper and lower respiratory specimens during the acute phase of infection. Negative results do not preclude SARS-CoV-2 infection, do not rule out co-infections with other pathogens, and should not be used as the sole basis for treatment or other patient management decisions. Negative results must be combined with clinical observations, patient history, and epidemiological information. The expected result is Negative.  Fact Sheet for Patients: SugarRoll.be  Fact Sheet for Healthcare Providers: https://www.woods-mathews.com/  This test is not yet approved or cleared by the [place] FDA and  has been authorized for detection and/or diagnosis of SARS-CoV-2 by FDA under an Emergency Use Authorization (EUA). This EUA will remain  in effect (meaning  this test can be used) for the duration of the COVID-19 declaration under Se ction 564(b)(1) of the Act, 21 U.S.C. section 360bbb-3(b)(1), unless the authorization is terminated or revoked sooner.  Performed at [facility], [place] [address], [place], [place] [address]   Gastrointestinal Panel by PCR , Stool     Status: None   Collection Time: 10/24/20  6:05 PM   Specimen: Stool  Result Value Ref Range Status   Campylobacter species NOT DETECTED NOT DETECTED Final   Plesimonas shigelloides NOT DETECTED NOT DETECTED Final   Salmonella species NOT DETECTED NOT DETECTED Final   Yersinia enterocolitica NOT DETECTED NOT DETECTED Final   Vibrio species NOT DETECTED NOT DETECTED Final   Vibrio cholerae NOT DETECTED NOT DETECTED Final   Enteroaggregative E coli (EAEC) NOT DETECTED NOT DETECTED Final   Enteropathogenic E coli (EPEC) NOT DETECTED NOT DETECTED Final   Enterotoxigenic E coli (ETEC) NOT DETECTED NOT DETECTED Final   Shiga like toxin  producing E coli (STEC) NOT DETECTED NOT DETECTED Final   Shigella/Enteroinvasive E coli (EIEC) NOT DETECTED NOT DETECTED Final   Cryptosporidium NOT DETECTED NOT DETECTED Final   Cyclospora cayetanensis NOT DETECTED NOT DETECTED Final   Entamoeba histolytica NOT DETECTED NOT DETECTED Final   Giardia lamblia NOT DETECTED NOT DETECTED Final   Adenovirus F40/41 NOT DETECTED NOT DETECTED Final   Astrovirus NOT DETECTED NOT DETECTED Final   Norovirus GI/GII NOT DETECTED NOT DETECTED Final   Rotavirus A NOT DETECTED NOT DETECTED Final   Sapovirus (I, II, IV, and V) NOT DETECTED NOT DETECTED Final    Comment: Performed at [facility], [address], [place], [place] [address]         Radiology Studies: No results found.      Scheduled Meds:  famotidine  10 mg Oral Daily   heparin  5,000 Units Subcutaneous Q8H   levothyroxine  88 mcg Oral QAC breakfast   melatonin  3 mg Oral QHS   multivitamin with minerals  1 tablet Oral Daily   Continuous Infusions:  sodium chloride       LOS: 2 days    Time spent: 35 minutes    Momin Misko Darleen Crocker, DO [facility]  If 7PM-7AM, please contact night-coverage [email or web address] 10/26/2020, 12:18 PM

## 2020-10-26 NOTE — Consult Note (Signed)
Consultation Note Date: 10/26/2020   Patient Name: [name]  DOB: 1936-02-23  MRN: [id number]  Age / Sex: 85 y.o., female  PCP: Lindell Spar, MD Referring Physician: Rodena Goldmann, DO  Reason for Consultation: Establishing goals of care and Psychosocial/spiritual support  HPI/Patient Profile: 85 y.o. female  with past medical history of COPD, CAD, HTN/HLD, CKD 4, fibromyalgia, OA, GERD, depression/anxiety, hypothyroid, former smoker, 55-pack-year stopped 2012 admitted on 10/24/2020 with acute on chronic kidney injury.   Clinical Assessment and Goals of Care: I have reviewed medical records including EPIC notes, labs and imaging, received report from RN, assessed the patient.  [name] is lying quietly in bed.  She greets me making and somewhat keeping eye contact.  She appears acutely/chronically frail.  She is able to tell me her name, but not where we are or the month.  I believe that she is able to make her basic needs known.  Her son, Rhett Bannister, is at bedside.  Meeting with son, Elspeth Cho and attending, Dr. Manuella Ghazi to discuss diagnosis prognosis, [place], EOL wishes, disposition and options.  I introduced Palliative Medicine as specialized medical care for people living with serious illness. It focuses on providing relief from the symptoms and stress of a serious illness. The goal is to improve quality of life for both the patient and the family.  We discussed a brief life review of the patient.  John states that [name] has been married to Luther for about 27 years.  He shares that she has had a functional decline over the last few months, poor by mouth intake for the last few weeks.  He states that she is declining to take food and liquid, he is having to push/encourage.   We talked about her current illness, acute kidney injury on chronic kidney disease and declining hemodialysis. The natural  disease trajectory and expectations at EOL were discussed.  We talked about what is normal and expected as people are nearing end-of-life including sleeping more, eating and interacting less.  I shared that it would not be surprising for [name] to stop eating, and only drink liquids.  Again, Jenny Reichmann states that he is having to force liquids.  The difference between aggressive medical intervention and comfort care was considered in light of the patient's goals of care.  We talked about focusing on comfort and dignity, at home hospice versus residential hospice.  Both Dr. Manuella Ghazi and I concur that [name] would qualify for residential hospice.  I share that hospice would focus on comfort and dignity, offer food and liquids, but no IV fluids.  John shares that he would like to discuss this with [name] husband, his stepfather Arnette Norris who should be arriving at the hospital soon.  PMT returns twice, Mr. Broy is not present. Secure chat from nursing staff stating that Mr. Wolff has arrived.    Meeting with spouse, Evelisse Szalkowski and son, Elspeth Cho in the family area.  Detailed discussion with Mr. Elenes and Jenny Reichmann.  They both endorse that [name] has been sharing that she is ready to die for the last year.  They share that she has had a functional decline over the last few months, nutritional decline over the last few weeks.  They both share that she is not eating or drinking enough to sustain herself.  Mr. Napoles shares that 1 year ago she weighed 255 pounds, today she is 116 pounds.  Hospice and Palliative Care services outpatient were explained and offered.  Mr. Molly Maduro shares that although he knows his wife would want to return to their home, he is unable to care for her.  He tells me that he is 68, has had a 5 vessel CABG, colostomy, and recently had Foley catheter removed after 7 months.  He does appear quite frail, and clearly unable to care for Mrs. Ballo basic needs at home.  We  talked about what is and is not provided in residential hospice, people go there to "let nature take its course".  Family is aware that [name] would not receive further IV fluids at residential hospice.  She would be offered food and drink if she so desired.  Discussed the importance of continued conversation with family and the medical providers regarding overall plan of care and treatment options, ensuring decisions are within the context of the patient's values and GOCs.  Questions and concerns were addressed. The family was encouraged to call with questions or concerns.  PMT will continue to support holistically.  Conference with attending, bedside nursing staff, transition of care team related to patient condition, needs, goals of care, disposition.   HCPOA   NEXT OF KIN -husband of 27 years, Makenna Macaluso.  Son, Rennis Petty assists with care and decision making.  Jonny Ruiz shares that he has 2 brothers.    SUMMARY OF RECOMMENDATIONS   Requesting comfort and dignity at end-of-life, residential hospice in [place], [facility]. Contact for hospice would be son, Rennis Petty Stop IV fluids, transition to full comfort care once accepted to residential hospice.  Code Status/Advance Care Planning: DNR  Symptom Management:  Per hospitalist, no additional needs at this time.  Palliative Prophylaxis:  Frequent Pain Assessment, Oral Care, and Turn Reposition  Additional Recommendations (Limitations, Scope, Preferences): Stop IV fluids, transition to full comfort care when accepted to residential hospice.  Psycho-social/Spiritual:  Desire for further Chaplaincy support:no Additional Recommendations: Caregiving  Support/Resources and Education on Hospice  Prognosis:  < 4 weeks, would not be surprising based on current functional status, poor by mouth intake for the last 2+ weeks, increasing creatinine but declines hemodialysis.  Weight loss of 40 pounds over the last  year.  Discharge Planning:  Requesting comfort and dignity at end-of-life, residential hospice at [facility] house       Primary Diagnoses: Present on Admission:  AKI (acute kidney injury) (HCC)   I have reviewed the medical record, interviewed the patient and family, and examined the patient. The following aspects are pertinent.  Past Medical History:  Diagnosis Date   Anxiety disorder, unspecified    Arteriosclerotic cardiovascular disease (ASCVD) 2006   Nonobstructive; < 50% lesions on cath 2002; negative stress nuclear study in 10/2004   Asthma    Chest pain, unspecified    Chronic kidney disease, unspecified    Chronic obstructive pulmonary disease, unspecified (HCC)    Chronic pelvic pain in female    Colitis due to Clostridium difficile 1994   1994   COPD (chronic obstructive pulmonary disease) (HCC)  Depression with anxiety    Diarrhea, unspecified    Disorder of thyroid, unspecified    Gastro-esophageal reflux disease without esophagitis    Gastroesophageal reflux disease    Hyperlipidemia    Hyperlipidemia, unspecified    Hypertension    Hypertensive chronic kidney disease with stage 1 through stage 4 chronic kidney disease, or unspecified chronic kidney disease    Hyperthyroidism    Hypothyroidism    Hypothyroidism, unspecified    Kidney disease, chronic, stage III (moderate, EGFR 30-59 ml/min) (HCC)    Lower abdominal pain, unspecified    Major depressive disorder, single episode, severe without psychotic features ([place])    Major depressive disorder, single episode, unspecified    Nausea    Other postherpetic nervous system involvement    Pelvic and perineal pain    Rash and other nonspecific skin eruption    S/P colonoscopy 2007   few diverticula, otherwise nl   S/P colonoscopy 12/13/10   rectal, cecal polyp, left-sided diverticulosis, hyperplastic. ?anal fissure? treated empirically   S/P endoscopy 2009   linear esophageal erosions   S/P endoscopy 05/10/10    Question island of salmon-colored epithelium in distal esophagus ; no Barrett's.    Shingles    Tobacco abuse, in remission    55-pack-year consumption; discontinued 08/2010   Unspecified asthma, uncomplicated    Unspecified osteoarthritis, unspecified site    Urinary tract infection, site not specified    Social History   Socioeconomic History   Marital status: Married    Spouse name: Not on file   Number of children: 4   Years of education: Not on file   Highest education level: Not on file  Occupational History   Not on file  Tobacco Use   Smoking status: Former    Packs/day: 1.00    Years: 55.00    Pack years: 55.00    Types: Cigarettes    Quit date: 09/15/2010    Years since quitting: 10.1   Smokeless tobacco: Never  Vaping Use   Vaping Use: Never used  Substance and Sexual Activity   Alcohol use: No    Alcohol/week: 0.0 standard drinks   Drug use: No   Sexual activity: Not Currently  Other Topics Concern   Not on file  Social History Narrative   Not on file   Social Determinants of Health   Financial Resource Strain: Low Risk    Difficulty of Paying Living Expenses: Not hard at all  Food Insecurity: No Food Insecurity   Worried About Charity fundraiser in the Last Year: Never true   [place] in the Last Year: Never true  Transportation Needs: No Transportation Needs   Lack of Transportation (Medical): No   Lack of Transportation (Non-Medical): No  Physical Activity: Inactive   Days of Exercise per Week: 0 days   Minutes of Exercise per Session: 0 min  Stress: No Stress Concern Present   Feeling of Stress : Not at all  Social Connections: Moderately Isolated   Frequency of Communication with Friends and Family: More than three times a week   Frequency of Social Gatherings with Friends and Family: More than three times a week   Attends Religious Services: Never   Marine scientist or Organizations: No   Attends Archivist  Meetings: Never   Marital Status: Married   Family History  Problem Relation Age of Onset   Diabetes Mother    Alzheimer's disease Mother    Cancer  Mother    Heart disease Father    Cancer Sister    Heart disease Brother        Also mother, Father, brother and 2 sons   Cancer Son        testicular cancer    Aneurysm Son    Diabetes Son    Hypertension Brother        also Sister x2   Colon cancer Neg Hx    Scheduled Meds:  famotidine  10 mg Oral Daily   heparin  5,000 Units Subcutaneous Q8H   levothyroxine  88 mcg Oral QAC breakfast   melatonin  3 mg Oral QHS   multivitamin with minerals  1 tablet Oral Daily   Continuous Infusions:  sodium chloride     PRN Meds:.acetaminophen **OR** acetaminophen, albuterol, HYDROmorphone (DILAUDID) injection, labetalol, ondansetron **OR** ondansetron (ZOFRAN) IV Medications Prior to Admission:  Prior to Admission medications   Medication Sig Start Date End Date Taking? Authorizing Provider  albuterol (VENTOLIN HFA) 108 (90 Base) MCG/ACT inhaler Inhale 1-2 puffs into the lungs every 6 (six) hours as needed for wheezing or shortness of breath. 06/14/20  Yes Lindell Spar, MD  amLODipine (NORVASC) 5 MG tablet Take 1 tablet (5 mg total) by mouth daily. 08/03/20  Yes Lindell Spar, MD  atorvastatin (LIPITOR) 20 MG tablet Take 20 mg every evening by mouth.    Yes [provider]  carvedilol (COREG) 6.25 MG tablet TAKE (1) TABLET BY MOUTH TWICE DAILY. Patient taking differently: Take 6.25 mg by mouth 2 (two) times daily. 08/20/20  Yes Strader, [place] M, PA-C  chlorthalidone (HYGROTON) 25 MG tablet Take 1 tablet (25 mg total) by mouth daily. 08/03/20  Yes Lindell Spar, MD  famotidine (PEPCID) 20 MG tablet Take 1 tablet (20 mg total) by mouth 2 (two) times daily. 08/21/20  Yes Noemi Chapel, MD  furosemide (LASIX) 40 MG tablet Take 40 mg by mouth daily.   Yes [provider]  gabapentin (NEURONTIN) 100 MG capsule TAKE 1 CAPSULE  BY MOUTH THREE TIMES A DAY. Patient taking differently: Take 100 mg by mouth 3 (three) times daily. 08/18/19  Yes Carole Civil, MD  hydrochlorothiazide (HYDRODIURIL) 25 MG tablet Take 25 mg by mouth daily. 08/20/20  Yes [provider]  HYDROcodone-acetaminophen (NORCO) 7.5-325 MG tablet Take 1 tablet by mouth 4 (four) times daily as needed for pain. 08/30/20  Yes [provider]  levothyroxine (SYNTHROID) 88 MCG tablet Take 1 tablet (88 mcg total) by mouth daily before breakfast. 06/16/20  Yes Patel, Colin Broach, MD  LINZESS 145 MCG CAPS capsule TAKE 1 CAPSULE ONCE DAILY BEFORE BREAKFAST. Patient taking differently: Take 145 mcg by mouth daily before breakfast. 08/06/19  Yes Mahala Menghini, PA-C  lisinopril (ZESTRIL) 10 MG tablet Take 10 mg by mouth at bedtime. 09/09/20  Yes [provider]  Multiple Vitamins-Minerals (EYE VITAMINS PO) Take 1 tablet by mouth daily.    Yes [provider]  ondansetron (ZOFRAN) 4 MG tablet TAKE 1 TABLET BY MOUTH THREE TIMES AS NEEDED FOR NAUSEA. Patient taking differently: Take 4 mg by mouth 3 (three) times daily as needed for nausea or vomiting. 09/17/20  Yes Lindell Spar, MD  pantoprazole (PROTONIX) 40 MG tablet TAKE (1) TABLET BY MOUTH TWICE DAILY. Patient taking differently: Take 40 mg by mouth 2 (two) times daily. 10/13/20  Yes Patel, Colin Broach, MD  permethrin (ELIMITE) 5 % cream WASH HAIR WITH REGULAR SHAMPOO, THEN RINSE AND TOWEL  DRY HAIR. SATURATE HAIR AND SCALP ESPECIALLY BEHIND EARS, LEAVE IN FOR 10 MINTUES, THEN RINSE WITH WATER. Patient taking differently: Apply 1 application topically daily. 09/29/20  Yes Lindell Spar, MD  potassium chloride SA (K-DUR) 20 MEQ tablet Take 20 mEq by mouth daily with lunch.  09/17/18  Yes [provider]  sucralfate (CARAFATE) 1 g tablet Take 1 tablet (1 g total) by mouth 4 (four) times daily -  with meals and at bedtime. 08/21/20  Yes Noemi Chapel, MD  DULoxetine (CYMBALTA) 30 MG  capsule Take 1 capsule (30 mg total) by mouth daily. Patient not taking: Reported on 10/24/2020 12/22/19 03/21/20  Lindell Spar, MD  DULoxetine (CYMBALTA) 60 MG capsule Take 60 mg by mouth daily. 10/05/20   [provider]  HYDROcodone-acetaminophen (NORCO) 10-325 MG tablet Take 0.5-2 tablets by mouth every 4 (four) hours as needed (1/2 tablet for mild pain, 1 tablet for moderate pain, 2 tablets for severe pain). Patient not taking: No sig reported 05/29/15   Erby Pian, NP   Allergies  Allergen Reactions   Hydralazine Other (See Comments)    Breathing   Nalbuphine Rash   Paroxetine Itching   Sulfa Antibiotics Other (See Comments)    Childhood reaction.   Venlafaxine Itching   Review of Systems  Unable to perform ROS: Age   Physical Exam Vitals and nursing note reviewed.  HENT:     Mouth/Throat:     Mouth: Mucous membranes are moist.  Cardiovascular:     Rate and Rhythm: Normal rate.  Pulmonary:     Effort: Pulmonary effort is normal. No respiratory distress.  Skin:    General: Skin is warm and dry.  Psychiatric:     Comments: Calm and cooperative    Vital Signs: BP (!) 144/64 (BP Location: Right Arm)   Pulse 62   Temp 98.1 F (36.7 C) (Oral)   Resp 18   Ht 5' (1.524 m)   Wt 52.9 kg   SpO2 99%   BMI 22.77 kg/m  Pain Scale: 0-10   Pain Score: Asleep   SpO2: SpO2: 99 % O2 Device:SpO2: 99 % O2 Flow Rate: .   IO: Intake/output summary:  Intake/Output Summary (Last 24 hours) at 10/26/2020 1244 Last data filed at 10/26/2020 0455 Gross per 24 hour  Intake 1987.62 ml  Output 950 ml  Net 1037.62 ml    LBM: Last BM Date: 10/24/20 Baseline Weight: Weight: 52.9 kg Most recent weight: Weight: 52.9 kg     Palliative Assessment/Data:   Flowsheet Rows    Flowsheet Row Most Recent Value  Intake Tab   Referral Department Hospitalist  Unit at Time of Referral Cardiac/Telemetry Unit  Palliative Care Primary Diagnosis Nephrology  Date Notified 10/24/20   Palliative Care Type New Palliative care  Reason for referral Clarify Goals of Care  Date of Admission 10/24/20  Date first seen by Palliative Care 10/26/20  # of days Palliative referral response time 2 Day(s)  # of days IP prior to Palliative referral 0  Clinical Assessment   Palliative Performance Scale Score 30%  Pain Max last 24 hours Not able to report  Pain Min Last 24 hours Not able to report  Dyspnea Max Last 24 Hours Not able to report  Dyspnea Min Last 24 hours Not able to report  Psychosocial & Spiritual Assessment   Palliative Care Outcomes        Time In: 0940 Time Out: 1100  Time Total: 80 minutes  Greater than  50%  of this time was spent counseling and coordinating care related to the above assessment and plan.  Signed by: Drue Novel, NP   Please contact Palliative Medicine Team phone at [phone] for questions and concerns.  For individual provider: See Shea Evans

## 2020-10-27 ENCOUNTER — Inpatient Hospital Stay (HOSPITAL_COMMUNITY)
Admission: RE | Admit: 2020-10-27 | Discharge: 2020-11-03 | DRG: 951 | Disposition: A | Discharge status: Skilled Nursing Facility | Payer: Medicare Other | Source: Intra-hospital | Attending: Family Medicine | Admitting: Family Medicine

## 2020-10-27 DIAGNOSIS — Z23 Encounter for immunization: Secondary | ICD-10-CM | POA: Diagnosis not present

## 2020-10-27 DIAGNOSIS — Z820 Family history of epilepsy and other diseases of the nervous system: Secondary | ICD-10-CM

## 2020-10-27 DIAGNOSIS — F32A Depression, unspecified: Secondary | ICD-10-CM | POA: Diagnosis present

## 2020-10-27 DIAGNOSIS — Z515 Encounter for palliative care: Secondary | ICD-10-CM | POA: Diagnosis not present

## 2020-10-27 DIAGNOSIS — N184 Chronic kidney disease, stage 4 (severe): Secondary | ICD-10-CM

## 2020-10-27 DIAGNOSIS — K219 Gastro-esophageal reflux disease without esophagitis: Secondary | ICD-10-CM | POA: Diagnosis present

## 2020-10-27 DIAGNOSIS — J449 Chronic obstructive pulmonary disease, unspecified: Secondary | ICD-10-CM

## 2020-10-27 DIAGNOSIS — Z751 Person awaiting admission to adequate facility elsewhere: Secondary | ICD-10-CM

## 2020-10-27 DIAGNOSIS — Z8249 Family history of ischemic heart disease and other diseases of the circulatory system: Secondary | ICD-10-CM | POA: Diagnosis not present

## 2020-10-27 DIAGNOSIS — F418 Other specified anxiety disorders: Secondary | ICD-10-CM

## 2020-10-27 DIAGNOSIS — M79605 Pain in left leg: Secondary | ICD-10-CM

## 2020-10-27 DIAGNOSIS — Z87891 Personal history of nicotine dependence: Secondary | ICD-10-CM

## 2020-10-27 DIAGNOSIS — Z79890 Hormone replacement therapy (postmenopausal): Secondary | ICD-10-CM

## 2020-10-27 DIAGNOSIS — E872 Acidosis: Secondary | ICD-10-CM | POA: Diagnosis present

## 2020-10-27 DIAGNOSIS — N179 Acute kidney failure, unspecified: Secondary | ICD-10-CM | POA: Diagnosis present

## 2020-10-27 DIAGNOSIS — Z90710 Acquired absence of both cervix and uterus: Secondary | ICD-10-CM

## 2020-10-27 DIAGNOSIS — R627 Adult failure to thrive: Secondary | ICD-10-CM | POA: Diagnosis present

## 2020-10-27 DIAGNOSIS — E860 Dehydration: Secondary | ICD-10-CM | POA: Diagnosis not present

## 2020-10-27 DIAGNOSIS — Z7189 Other specified counseling: Secondary | ICD-10-CM | POA: Diagnosis not present

## 2020-10-27 DIAGNOSIS — Z66 Do not resuscitate: Secondary | ICD-10-CM | POA: Diagnosis present

## 2020-10-27 DIAGNOSIS — Z6822 Body mass index (BMI) 22.0-22.9, adult: Secondary | ICD-10-CM

## 2020-10-27 DIAGNOSIS — Z20822 Contact with and (suspected) exposure to covid-19: Secondary | ICD-10-CM | POA: Diagnosis present

## 2020-10-27 DIAGNOSIS — M1909 Primary osteoarthritis, other specified site: Secondary | ICD-10-CM | POA: Diagnosis not present

## 2020-10-27 DIAGNOSIS — I129 Hypertensive chronic kidney disease with stage 1 through stage 4 chronic kidney disease, or unspecified chronic kidney disease: Secondary | ICD-10-CM | POA: Diagnosis present

## 2020-10-27 DIAGNOSIS — M797 Fibromyalgia: Secondary | ICD-10-CM | POA: Diagnosis present

## 2020-10-27 DIAGNOSIS — R54 Age-related physical debility: Secondary | ICD-10-CM | POA: Diagnosis present

## 2020-10-27 DIAGNOSIS — M545 Low back pain, unspecified: Secondary | ICD-10-CM

## 2020-10-27 DIAGNOSIS — R197 Diarrhea, unspecified: Secondary | ICD-10-CM | POA: Diagnosis not present

## 2020-10-27 DIAGNOSIS — N1832 Chronic kidney disease, stage 3b: Secondary | ICD-10-CM | POA: Diagnosis not present

## 2020-10-27 DIAGNOSIS — Z882 Allergy status to sulfonamides status: Secondary | ICD-10-CM | POA: Diagnosis not present

## 2020-10-27 DIAGNOSIS — Z888 Allergy status to other drugs, medicaments and biological substances status: Secondary | ICD-10-CM

## 2020-10-27 DIAGNOSIS — E876 Hypokalemia: Secondary | ICD-10-CM | POA: Diagnosis not present

## 2020-10-27 DIAGNOSIS — R001 Bradycardia, unspecified: Secondary | ICD-10-CM | POA: Diagnosis present

## 2020-10-27 DIAGNOSIS — E039 Hypothyroidism, unspecified: Secondary | ICD-10-CM

## 2020-10-27 DIAGNOSIS — Z833 Family history of diabetes mellitus: Secondary | ICD-10-CM

## 2020-10-27 DIAGNOSIS — Z79899 Other long term (current) drug therapy: Secondary | ICD-10-CM | POA: Diagnosis not present

## 2020-10-27 DIAGNOSIS — Z9049 Acquired absence of other specified parts of digestive tract: Secondary | ICD-10-CM

## 2020-10-27 DIAGNOSIS — M1990 Unspecified osteoarthritis, unspecified site: Secondary | ICD-10-CM | POA: Diagnosis present

## 2020-10-27 DIAGNOSIS — E785 Hyperlipidemia, unspecified: Secondary | ICD-10-CM | POA: Diagnosis present

## 2020-10-27 DIAGNOSIS — I2510 Atherosclerotic heart disease of native coronary artery without angina pectoris: Secondary | ICD-10-CM | POA: Diagnosis present

## 2020-10-27 LAB — BASIC METABOLIC PANEL
Anion gap: 11 (ref 5–15)
BUN: 93 mg/dL — ABNORMAL HIGH (ref 8–23)
CO2: 18 mmol/L — ABNORMAL LOW (ref 22–32)
Calcium: 9.5 mg/dL (ref 8.9–10.3)
Chloride: 113 mmol/L — ABNORMAL HIGH (ref 98–111)
Creatinine, Ser: 3.49 mg/dL — ABNORMAL HIGH (ref 0.44–1.00)
GFR, Estimated: 13 mL/min — ABNORMAL LOW (ref >60)
Glucose, Bld: 96 mg/dL (ref 70–99)
Potassium: 3.8 mmol/L (ref 3.5–5.1)
Sodium: 142 mmol/L (ref 135–145)

## 2020-10-27 LAB — MAGNESIUM: Magnesium: 1.8 mg/dL (ref 1.7–2.4)

## 2020-10-27 MED ORDER — GLYCOPYRROLATE 0.2 MG/ML IJ SOLN: 0.2000 mg | INTRAMUSCULAR | Status: DC | PRN

## 2020-10-27 MED ORDER — ACETAMINOPHEN 650 MG RE SUPP: 650.0000 mg | Freq: Four times a day (QID) | RECTAL | Status: DC | PRN

## 2020-10-27 MED ORDER — ONDANSETRON 4 MG PO TBDP: 4.0000 mg | ORAL_TABLET | Freq: Four times a day (QID) | ORAL | Status: DC | PRN

## 2020-10-27 MED ORDER — ONDANSETRON HCL 4 MG PO TABS: 4.0000 mg | ORAL_TABLET | Freq: Four times a day (QID) | ORAL | Status: DC | PRN

## 2020-10-27 MED ORDER — POLYVINYL ALCOHOL 1.4 % OP SOLN: 1.0000 [drp] | Freq: Four times a day (QID) | OPHTHALMIC | Status: DC | PRN

## 2020-10-27 MED ORDER — LORAZEPAM 2 MG/ML IJ SOLN: 1.0000 mg | INTRAMUSCULAR | Status: DC | PRN

## 2020-10-27 MED ORDER — HALOPERIDOL LACTATE 2 MG/ML PO CONC
0.5000 mg | ORAL | Status: DC | PRN
  Filled 2020-10-27: qty 0.3

## 2020-10-27 MED ORDER — MELATONIN 3 MG PO TABS
3.0000 mg | ORAL_TABLET | Freq: Every day | ORAL | Status: DC
  Administered 2020-10-27 – 2020-11-02 (×7): 3 mg via ORAL
  Filled 2020-10-27 (×8): qty 1

## 2020-10-27 MED ORDER — HYDROMORPHONE HCL 1 MG/ML IJ SOLN
0.5000 mg | INTRAMUSCULAR | Status: DC | PRN
  Administered 2020-10-28: 1 mg via INTRAVENOUS
  Filled 2020-10-27 (×2): qty 1

## 2020-10-27 MED ORDER — LORAZEPAM 2 MG/ML PO CONC: 1.0000 mg | ORAL | Status: DC | PRN

## 2020-10-27 MED ORDER — BIOTENE DRY MOUTH MT LIQD: 15.0000 mL | OROMUCOSAL | Status: DC | PRN

## 2020-10-27 MED ORDER — ACETAMINOPHEN 325 MG PO TABS: 650.0000 mg | ORAL_TABLET | Freq: Four times a day (QID) | ORAL | Status: DC | PRN

## 2020-10-27 MED ORDER — ACETAMINOPHEN 325 MG PO TABS
650.0000 mg | ORAL_TABLET | Freq: Four times a day (QID) | ORAL | Status: DC | PRN
  Administered 2020-10-28 – 2020-10-30 (×2): 650 mg via ORAL
  Filled 2020-10-27 (×2): qty 2

## 2020-10-27 MED ORDER — HYDROMORPHONE HCL 1 MG/ML IJ SOLN
0.5000 mg | INTRAMUSCULAR | Status: DC | PRN
  Administered 2020-10-27 – 2020-11-01 (×9): 1 mg via INTRAVENOUS
  Filled 2020-10-27: qty 1
  Filled 2020-10-27: qty 0.5
  Filled 2020-10-27 (×7): qty 1

## 2020-10-27 MED ORDER — LORAZEPAM 1 MG PO TABS
1.0000 mg | ORAL_TABLET | ORAL | Status: DC | PRN
  Administered 2020-10-30 – 2020-10-31 (×3): 1 mg via ORAL
  Filled 2020-10-27 (×3): qty 1

## 2020-10-27 MED ORDER — ALBUTEROL SULFATE HFA 108 (90 BASE) MCG/ACT IN AERS: 1.0000 | INHALATION_SPRAY | Freq: Four times a day (QID) | RESPIRATORY_TRACT | Status: DC | PRN

## 2020-10-27 MED ORDER — HALOPERIDOL LACTATE 5 MG/ML IJ SOLN: 0.5000 mg | INTRAMUSCULAR | Status: DC | PRN

## 2020-10-27 MED ORDER — FAMOTIDINE 20 MG PO TABS
20.0000 mg | ORAL_TABLET | Freq: Every day | ORAL | Status: DC
  Administered 2020-10-28: 20 mg via ORAL
  Filled 2020-10-27: qty 1

## 2020-10-27 MED ORDER — GLYCOPYRROLATE 1 MG PO TABS: 1.0000 mg | ORAL_TABLET | ORAL | Status: DC | PRN

## 2020-10-27 MED ORDER — HALOPERIDOL 0.5 MG PO TABS
0.5000 mg | ORAL_TABLET | ORAL | Status: DC | PRN
  Administered 2020-10-31: 0.5 mg via ORAL
  Filled 2020-10-27: qty 1

## 2020-10-27 MED ORDER — ONDANSETRON HCL 4 MG/2ML IJ SOLN
4.0000 mg | Freq: Four times a day (QID) | INTRAMUSCULAR | Status: DC | PRN
  Filled 2020-10-27: qty 2

## 2020-10-27 MED ORDER — ONDANSETRON HCL 4 MG/2ML IJ SOLN: 4.0000 mg | Freq: Four times a day (QID) | INTRAMUSCULAR | Status: DC | PRN

## 2020-10-27 NOTE — Clinical Social Work Note (Addendum)
Per palliative care NP request, patient referred to [facility]. [facility] accepts referral, no beds available. Patient will be GIP. Attending and bedside nurse notified.      Bellah Alia, Clydene Pugh, LCSW

## 2020-10-27 NOTE — H&P (Signed)
History and Physical    [name] SNK:[id number] DOB: July 13, 1935 DOA: 10/27/2020  PCP: Lindell Spar, MD   Patient coming from: Patient came from home; currently transition to GIP.  I have personally briefly reviewed patient's old medical records in [place]  Chief Complaint: End-of-life care.  HPI: As per H&P written by Dr. Manuella Ghazi on 10/24/2020 [name] is a 85 y.o. female with medical history significant for COPD, CAD, hypertension, dyslipidemia, CKD stage IV, fibromyalgia, and osteoarthritis who was brought to the ED due to not eating or drinking anything for the last 1 week and now has increased weakness.  She has had no abdominal pain, nausea, vomiting, diarrhea, fevers, chills, or cough.  She has had prior COVID vaccinations.  Son at bedside states that she is getting set up to see a nephrologist outpatient for advanced chronic kidney disease.  Patient has been taking her home medications routinely to include her diuretics.   ED Course: Vital signs stable and patient is afebrile.  Hemoglobin is 11.8, sodium 133, creatinine 8.09 and BUN 176.  Potassium is 3.6.  Patient has received 2 L fluid bolus in the ED.   Patient with poor prognosis despite improvement in her renal function with IV fluids; continue to have poor oral intake and capability of maintaining adequate nutrition and hydration.  At this time after goals of care discussion by palliative care service family and patient decision has been made to transition to full comfort care and symptomatic management only.  Patient was referred to hospice and accepted; currently no beds at the [facility] for Patient to Be Transferred.  She Has Been Transition to GIP Status to [facility] in the Hospital While Awaiting Bed Availability at [facility].    Review of Systems: As per HPI otherwise all other systems reviewed and are negative.   Past Medical History:  Diagnosis Date   Anxiety disorder,  unspecified    Arteriosclerotic cardiovascular disease (ASCVD) 2006   Nonobstructive; < 50% lesions on cath 2002; negative stress nuclear study in 10/2004   Asthma    Chest pain, unspecified    Chronic kidney disease, unspecified    Chronic obstructive pulmonary disease, unspecified ([place])    Chronic pelvic pain in female    Colitis due to Clostridium difficile 1994   1994   COPD (chronic obstructive pulmonary disease) ([place])    Depression with anxiety    Diarrhea, unspecified    Disorder of thyroid, unspecified    Gastro-esophageal reflux disease without esophagitis    Gastroesophageal reflux disease    Hyperlipidemia    Hyperlipidemia, unspecified    Hypertension    Hypertensive chronic kidney disease with stage 1 through stage 4 chronic kidney disease, or unspecified chronic kidney disease    Hyperthyroidism    Hypothyroidism    Hypothyroidism, unspecified    Kidney disease, chronic, stage III (moderate, EGFR 30-59 ml/min) (HCC)    Lower abdominal pain, unspecified    Major depressive disorder, single episode, severe without psychotic features ([place])    Major depressive disorder, single episode, unspecified    Nausea    Other postherpetic nervous system involvement    Pelvic and perineal pain    Rash and other nonspecific skin eruption    S/P colonoscopy 2007   few diverticula, otherwise nl   S/P colonoscopy 12/13/10   rectal, cecal polyp, left-sided diverticulosis, hyperplastic. ?anal fissure? treated empirically   S/P endoscopy 2009   linear esophageal erosions   S/P  endoscopy 05/10/10   Question island of salmon-colored epithelium in distal esophagus ; no Barrett's.    Shingles    Tobacco abuse, in remission    55-pack-year consumption; discontinued 08/2010   Unspecified asthma, uncomplicated    Unspecified osteoarthritis, unspecified site    Urinary tract infection, site not specified     Past Surgical History:  Procedure Laterality Date   ABDOMINAL HYSTERECTOMY      BREAST LUMPECTOMY     CATARACT EXTRACTION     CHOLECYSTECTOMY  1962   COLONOSCOPY  12/13/2010   Left-sided diverticulosis.  Cecal polyp, status post hot snare polypectomy/ Diminutive rectal polyp, status post cold biopsy removal tender/painful anal canal, ? occult anal fissure- Not visualized   COLONOSCOPY N/A 10/14/2015   Diverticulosis   ESOPHAGOGASTRODUODENOSCOPY  05/10/10   benign mucosa with mild chronic inflammation.   ESOPHAGOGASTRODUODENOSCOPY (EGD) WITH PROPOFOL N/A 02/13/2019   Procedure: ESOPHAGOGASTRODUODENOSCOPY (EGD) WITH PROPOFOL;  Surgeon: Daneil Dolin, MD;  Location: [facility];  Service: Endoscopy;  Laterality: N/A;  3:00pm   FLEXIBLE SIGMOIDOSCOPY  12/29/2011   Procedure: FLEXIBLE SIGMOIDOSCOPY;  Surgeon: Rogene Houston, MD;  Location: [facility];  Service: Endoscopy;  Laterality: N/A;  200-Ann notified pt to be here @ 0:26   NISSEN FUNDOPLICATION     YAG LASER APPLICATION Left 3/78/5885   Procedure: YAG LASER APPLICATION;  Surgeon: Williams Che, MD;  Location: [facility] ORS;  Service: Ophthalmology;  Laterality: Left;    Social History  reports that she quit smoking about 10 years ago. Her smoking use included cigarettes. She has a 55.00 pack-year smoking history. She has never used smokeless tobacco. She reports that she does not drink alcohol and does not use drugs.  Allergies  Allergen Reactions   Hydralazine Other (See Comments)    Breathing   Nalbuphine Rash   Paroxetine Itching   Sulfa Antibiotics Other (See Comments)    Childhood reaction.   Venlafaxine Itching    Family History  Problem Relation Age of Onset   Diabetes Mother    Alzheimer's disease Mother    Cancer Mother    Heart disease Father    Cancer Sister    Heart disease Brother        Also mother, Father, brother and 2 sons   Cancer Son        testicular cancer    Aneurysm Son    Diabetes Son    Hypertension Brother        also Sister x2   Colon cancer Neg Hx     Prior to  Admission medications   Medication Sig Start Date End Date Taking? Authorizing Provider  albuterol (VENTOLIN HFA) 108 (90 Base) MCG/ACT inhaler Inhale 1-2 puffs into the lungs every 6 (six) hours as needed for wheezing or shortness of breath. 06/14/20   Lindell Spar, MD  amLODipine (NORVASC) 5 MG tablet Take 1 tablet (5 mg total) by mouth daily. 08/03/20   Lindell Spar, MD  atorvastatin (LIPITOR) 20 MG tablet Take 20 mg every evening by mouth.     [provider]  carvedilol (COREG) 6.25 MG tablet TAKE (1) TABLET BY MOUTH TWICE DAILY. Patient taking differently: Take 6.25 mg by mouth 2 (two) times daily. 08/20/20   Strader, Fransisco Hertz, PA-C  chlorthalidone (HYGROTON) 25 MG tablet Take 1 tablet (25 mg total) by mouth daily. 08/03/20   Lindell Spar, MD  DULoxetine (CYMBALTA) 30 MG capsule Take 1 capsule (30 mg total) by mouth  daily. Patient not taking: Reported on 10/24/2020 12/22/19 03/21/20  Anabel Halon, MD  DULoxetine (CYMBALTA) 60 MG capsule Take 60 mg by mouth daily. 10/05/20   [provider]  famotidine (PEPCID) 20 MG tablet Take 1 tablet (20 mg total) by mouth 2 (two) times daily. 08/21/20   Eber Hong, MD  furosemide (LASIX) 40 MG tablet Take 40 mg by mouth daily.    [provider]  gabapentin (NEURONTIN) 100 MG capsule TAKE 1 CAPSULE BY MOUTH THREE TIMES A DAY. Patient taking differently: Take 100 mg by mouth 3 (three) times daily. 08/18/19   Vickki Hearing, MD  hydrochlorothiazide (HYDRODIURIL) 25 MG tablet Take 25 mg by mouth daily. 08/20/20   [provider]  HYDROcodone-acetaminophen (NORCO) 10-325 MG tablet Take 0.5-2 tablets by mouth every 4 (four) hours as needed (1/2 tablet for mild pain, 1 tablet for moderate pain, 2 tablets for severe pain). Patient not taking: No sig reported 05/29/15   Riebock, Anette Riedel, NP  HYDROcodone-acetaminophen (NORCO) 7.5-325 MG tablet Take 1 tablet by mouth 4 (four) times daily as needed for pain. 08/30/20    [provider]  levothyroxine (SYNTHROID) 88 MCG tablet Take 1 tablet (88 mcg total) by mouth daily before breakfast. 06/16/20   Allena Katz, Earlie Lou, MD  LINZESS 145 MCG CAPS capsule TAKE 1 CAPSULE ONCE DAILY BEFORE BREAKFAST. Patient taking differently: Take 145 mcg by mouth daily before breakfast. 08/06/19   Tiffany Kocher, PA-C  lisinopril (ZESTRIL) 10 MG tablet Take 10 mg by mouth at bedtime. 09/09/20   [provider]  Multiple Vitamins-Minerals (EYE VITAMINS PO) Take 1 tablet by mouth daily.     [provider]  ondansetron (ZOFRAN) 4 MG tablet TAKE 1 TABLET BY MOUTH THREE TIMES AS NEEDED FOR NAUSEA. Patient taking differently: Take 4 mg by mouth 3 (three) times daily as needed for nausea or vomiting. 09/17/20   Anabel Halon, MD  pantoprazole (PROTONIX) 40 MG tablet TAKE (1) TABLET BY MOUTH TWICE DAILY. Patient taking differently: Take 40 mg by mouth 2 (two) times daily. 10/13/20   Anabel Halon, MD  permethrin (ELIMITE) 5 % cream WASH HAIR WITH REGULAR SHAMPOO, THEN RINSE AND TOWEL DRY HAIR. SATURATE HAIR AND SCALP ESPECIALLY BEHIND EARS, LEAVE IN FOR 10 MINTUES, THEN RINSE WITH WATER. Patient taking differently: Apply 1 application topically daily. 09/29/20   Anabel Halon, MD  potassium chloride SA (K-DUR) 20 MEQ tablet Take 20 mEq by mouth daily with lunch.  09/17/18   [provider]  sucralfate (CARAFATE) 1 g tablet Take 1 tablet (1 g total) by mouth 4 (four) times daily -  with meals and at bedtime. 08/21/20   Eber Hong, MD    Physical Exam: There were no vitals filed for this visit.   General: Elderly, frail, afebrile and in no major distress.  Very little oral intake, no chest pain, no nausea, no vomiting. Cardiovascular: Rate controlled, no rubs, no gallops, no JVD. Respiratory: Clear to auscultation bilaterally; no using accessory muscle.  Good saturation. Abdomen: Soft, nontender, nondistended, positive bowel sounds Extremities: No cyanosis  or clubbing.   Labs on Admission: I have personally reviewed following labs and imaging studies  CBC: Recent Labs  Lab 10/24/20 1204 10/24/20 1209 10/25/20 0534  WBC 7.3  --  6.0  NEUTROABS 4.6  --   --   HGB 11.8* 11.9* 11.4*  HCT 35.4* 35.0* 34.4*  MCV 89.8  --  88.2  PLT 207  --  092    Basic Metabolic Panel: Recent Labs  Lab 10/24/20 1204 10/24/20 1209 10/25/20 0534 10/26/20 0442 10/27/20 0402  NA 133* 132* 140 139 142  K 3.6 3.6 3.3* 3.0* 3.8  CL 97* 97* 105 109 113*  CO2 19*  --  18* 18* 18*  GLUCOSE 105* 101* 94 91 96  BUN 176* >130* 147* 117* 93*  CREATININE 8.09* 8.90* 6.10* 4.55* 3.49*  CALCIUM 8.8*  --  9.2 8.9 9.5  MG  --   --  1.9 1.9 1.8    GFR: Estimated Creatinine Clearance: 8.9 mL/min (A) (by C-G formula based on SCr of 3.49 mg/dL (H)).  Liver Function Tests: Recent Labs  Lab 10/24/20 1204 10/25/20 0534  AST 17 23  ALT 10 11  ALKPHOS 49 46  BILITOT 1.1 1.0  PROT 6.2* 6.3*  ALBUMIN 3.4* 3.3*    Urine analysis:    Component Value Date/Time   COLORURINE YELLOW 10/24/2020 1301   APPEARANCEUR HAZY (A) 10/24/2020 1301   LABSPEC 1.008 10/24/2020 1301   PHURINE 6.0 10/24/2020 1301   GLUCOSEU NEGATIVE 10/24/2020 1301   HGBUR MODERATE (A) 10/24/2020 1301   BILIRUBINUR NEGATIVE 10/24/2020 1301   BILIRUBINUR negative 12/22/2019 1115   KETONESUR NEGATIVE 10/24/2020 1301   PROTEINUR NEGATIVE 10/24/2020 1301   UROBILINOGEN 0.2 12/22/2019 1115   UROBILINOGEN 0.2 09/24/2014 1015   NITRITE NEGATIVE 10/24/2020 1301   LEUKOCYTESUR SMALL (A) 10/24/2020 1301    Radiological Exams on Admission: No results found.  EKG: None  Assessment/Plan 1-acute on chronic kidney disease stage IV at baseline -Suspected to be prerenal in nature in the setting of failure to thrive and decreased oral intake. -Some improvement were seen at the moment of IV fluids has been provided; patient continued to have very little oral intake and not interested on  dialysis -Remains metabolic acidotic, elevated BUN and creatinine almost reaching for currently. -Goals of care discussion as trigger decision to pursue comfort care and symptomatic management only; patient has declined any invasive or artificial intervention.   2-failure to thrive -Continue to have poor oral intake -Very limited social interactions -Appreciate assistance and recommendations by palliative care who after discussing with family and patient has got to final decisions of pursuing goal for care and symptomatic management only. -Patient has been accepted by hospice and currently transition to GIP while waiting available bed at [place].   3-hypokalemia -Was repleted and within normal limits on her last blood work -No further blood work anticipated -Will focus on comfort care only.   4-diarrhea -Currently resolved -GI panel negative -Continue symptomatic management.   5-elevated serum free light chains -Most likely related to chronic kidney disease -At this moment no evaluation by hematology/oncology and following patient's goals of care decisions will focus on symptomatic management and comfort care.   6-history of hypertension and COPD -Antihypertensive medications discontinue -Patient allowed to have regular diet and come for feeding. -Continue as needed bronchodilators -No requiring oxygen supplementation.   7-fibromyalgia/depression -Flat affect appreciated -Currently focusing on symptomatic management and comfort care. -Cymbalta has been discontinued.   8-history of hypothyroidism -At this moment while focusing on comfort care and symptomatic management only, will discontinue Synthroid.   9-GERD -continue famotidine.  DVT prophylaxis: Comfort care. Code Status:   DNR/DNI Family Communication:  Son at bedside. Disposition Plan:   Patient is from:  Home  Anticipated DC to:  Residential hospice  Anticipated DC date:  Whenever bed is available at  residential hospice.  Anticipated DC  barriers: Lack of beds at [facility] called:  Hospice Admission status:  GIP, MedSurg, length of stay more than 2 midnights.  Severity of Illness: The appropriate patient status for this patient is INPATIENT. Inpatient status is judged to be reasonable and necessary in order to provide the required intensity of service to ensure the patient's safety. The patient's presenting symptoms, physical exam findings, and initial radiographic and laboratory data in the context of their chronic comorbidities is felt to place them at high risk for further clinical deterioration. Furthermore, it is not anticipated that the patient will be medically stable for discharge from the hospital within 2 midnights of admission. The following factors support the patient status of inpatient.   " The patient's presenting symptoms include decreased oral intake and failure to thrive; patient has been admitted under hospice evaluation for end-of-life care.. " The worrisome physical exam findings include prerenal azotemia, dehydration, worsening renal function and ongoing decreased oral intake.  * I certify that at the point of admission it is my clinical judgment that the patient will require inpatient hospital care spanning beyond 2 midnights from the point of admission due to high intensity of service, high risk for further deterioration and high frequency of surveillance required.Barton Dubois MD [facility]  How to contact the [facility] Attending or Consulting provider [place] or covering provider during after hours [place], for this patient?   Check the care team in [facility] and look for a) attending/consulting [facility] provider listed and b) the [facility] team listed Log into [email or web address] and use [place]'s universal password to access. If you do not have the password, please contact the hospital operator. Locate the [facility] provider you are looking for under [facility]  and page to a number that you can be directly reached. If you still have difficulty reaching the provider, please page the [facility] (Director on Call) for the Hospitalists listed on amion for assistance.  10/27/2020, 7:06 PM

## 2020-10-27 NOTE — Progress Notes (Signed)
Nutrition Brief Note  Chart reviewed. Pt now transitioning to comfort care.  No further nutrition interventions planned at this time.  Please re-consult as needed.   Lynn Sanyia Dini MS,RD,CSG,LDN Contact: AMION 

## 2020-10-27 NOTE — Discharge Summary (Signed)
Physician Discharge Summary  [name] KXF:[id number] DOB: 1935-12-27 DOA: 10/24/2020  PCP: Jessica Spar, MD  Admit date: 10/24/2020 Discharge date: 10/27/2020  Time spent: 35 minutes  Recommendations for Outpatient Follow-up:  Comfort care and symptomatic management.   Discharge Diagnoses:  Principal Problem:   AKI (acute kidney injury) ([place]) Active Problems:   Hypothyroidism   CKD (chronic kidney disease) stage 4, GFR 15-29 ml/min (HCC)   Depression with anxiety   Chronic obstructive pulmonary disease, unspecified (HCC)   Gastro-esophageal reflux disease without esophagitis   Chronic pain syndrome   Discharge Condition: patient accepted by hospice for [facility] placement and comfort care; GIP currently while waiting bed availability.  Code status: DNR/DNI  Diet recommendation: regular diet/comfort feeding  Filed Weights   10/24/20 1148  Weight: 52.9 kg    History of present illness:  As per H&P written by Dr. Manuella [name] on 10/24/2020 [name] is a 85 y.o. female with medical history significant for COPD, CAD, hypertension, dyslipidemia, CKD stage IV, fibromyalgia, and osteoarthritis who was brought to the ED due to not eating or drinking anything for the last 1 week and now has increased weakness.  She has had no abdominal pain, nausea, vomiting, diarrhea, fevers, chills, or cough.  She has had prior COVID vaccinations.  Son at bedside states that she is getting set up to see a nephrologist outpatient for advanced chronic kidney disease.  Patient has been taking her home medications routinely to include her diuretics.   ED Course: Vital signs stable and patient is afebrile.  Hemoglobin is 11.8, sodium 133, creatinine 8.09 and BUN 176.  Potassium is 3.6.  Patient has received 2 L fluid bolus in the ED.  Hospital Course:  1-acute on chronic kidney disease stage IV at baseline -Suspected to be prerenal in nature in the setting of failure to thrive and decreased oral  intake. -Some improvement were seen at the moment of IV fluids has been provided; patient continued to have very little oral intake and not interested on dialysis -Remains metabolic acidotic, elevated BUN and creatinine almost reaching for currently. -Goals of care discussion as trigger decision to pursue comfort care and symptomatic management only; patient has declined any invasive or artificial intervention.  2-failure to thrive -Continue to have poor oral intake -Very limited social interactions -Appreciate assistance and recommendations by palliative care who after discussing with family and patient has got to final decisions of pursuing goal for care and symptomatic management only. -Patient has been accepted by hospice and currently transition to GIP while waiting available bed at [facility].  3-hypokalemia -Was repleted and within normal limits on her last blood work -No further blood work anticipated -Will focus on comfort care only.  4-diarrhea -Currently resolved -GI panel negative -Continue symptomatic management.  5-elevated serum free light chains -Most likely related to chronic kidney disease -At this moment no evaluation by hematology/oncology and following patient's goals of care decisions will focus on symptomatic management and comfort care.  6-history of hypertension and COPD -Antihypertensive medications discontinue -Patient allowed to have regular diet and come for feeding. -Continue as needed bronchodilators -No requiring oxygen supplementation.  7-fibromyalgia/depression -Flat affect appreciated -Currently focusing on symptomatic management and comfort care. -Cymbalta has been discontinued.  8-history of hypothyroidism -At this moment while focusing on comfort care and symptomatic management only, will discontinue Synthroid.  9-GERD -continue famotidine.  Procedures:  See below for x-ray reports  Consultations: Palliative  care Hospice  Discharge Exam: Vitals:  10/27/20 0409 10/27/20 1223  BP: (!) 145/79 135/69  Pulse: 61 69  Resp: 14 18  Temp: 98.1 F (36.7 C) 98.1 F (36.7 C)  SpO2: 99% 98%    General: Elderly, frail, afebrile and in no major distress.  Very little oral intake, no chest pain, no nausea, no vomiting. Cardiovascular: Rate controlled, no rubs, no gallops, no JVD. Respiratory: Clear to auscultation bilaterally; no using accessory muscle.  Good saturation. Abdomen: Soft, nontender, nondistended, positive bowel sounds Extremities: No cyanosis or clubbing.  Discharge Instructions   Discharge Instructions     Discharge instructions   Complete by: As directed    Comfort care and symptomatic management      Allergies as of 10/27/2020       Reactions   Hydralazine Other (See Comments)   Breathing   Nalbuphine Rash   Paroxetine Itching   Sulfa Antibiotics Other (See Comments)   Childhood reaction.   Venlafaxine Itching        Medication List     ASK your doctor about these medications    albuterol 108 (90 Base) MCG/ACT inhaler Commonly known as: VENTOLIN HFA Inhale 1-2 puffs into the lungs every 6 (six) hours as needed for wheezing or shortness of breath.   amLODipine 5 MG tablet Commonly known as: NORVASC Take 1 tablet (5 mg total) by mouth daily.   atorvastatin 20 MG tablet Commonly known as: LIPITOR Take 20 mg every evening by mouth.   carvedilol 6.25 MG tablet Commonly known as: COREG TAKE (1) TABLET BY MOUTH TWICE DAILY.   chlorthalidone 25 MG tablet Commonly known as: HYGROTON Take 1 tablet (25 mg total) by mouth daily.   DULoxetine 30 MG capsule Commonly known as: CYMBALTA Take 1 capsule (30 mg total) by mouth daily.   DULoxetine 60 MG capsule Commonly known as: CYMBALTA Take 60 mg by mouth daily.   EYE VITAMINS PO Take 1 tablet by mouth daily.   famotidine 20 MG tablet Commonly known as: PEPCID Take 1 tablet (20 mg total) by mouth 2 (two)  times daily.   furosemide 40 MG tablet Commonly known as: LASIX Take 40 mg by mouth daily.   gabapentin 100 MG capsule Commonly known as: NEURONTIN TAKE 1 CAPSULE BY MOUTH THREE TIMES A DAY.   hydrochlorothiazide 25 MG tablet Commonly known as: HYDRODIURIL Take 25 mg by mouth daily.   HYDROcodone-acetaminophen 10-325 MG tablet Commonly known as: NORCO Take 0.5-2 tablets by mouth every 4 (four) hours as needed (1/2 tablet for mild pain, 1 tablet for moderate pain, 2 tablets for severe pain).   HYDROcodone-acetaminophen 7.5-325 MG tablet Commonly known as: NORCO Take 1 tablet by mouth 4 (four) times daily as needed for pain.   levothyroxine 88 MCG tablet Commonly known as: SYNTHROID Take 1 tablet (88 mcg total) by mouth daily before breakfast.   Linzess 145 MCG Caps capsule Generic drug: linaclotide TAKE 1 CAPSULE ONCE DAILY BEFORE BREAKFAST.   lisinopril 10 MG tablet Commonly known as: ZESTRIL Take 10 mg by mouth at bedtime. Ask about: Which instructions should I use?   ondansetron 4 MG tablet Commonly known as: ZOFRAN TAKE 1 TABLET BY MOUTH THREE TIMES AS NEEDED FOR NAUSEA.   pantoprazole 40 MG tablet Commonly known as: PROTONIX TAKE (1) TABLET BY MOUTH TWICE DAILY.   permethrin 5 % cream Commonly known as: ELIMITE WASH HAIR WITH REGULAR SHAMPOO, THEN RINSE AND TOWEL DRY HAIR. SATURATE HAIR AND SCALP ESPECIALLY BEHIND EARS, LEAVE IN FOR 10 MINTUES, THEN RINSE  WITH WATER.   potassium chloride SA 20 MEQ tablet Commonly known as: KLOR-CON Take 20 mEq by mouth daily with lunch.   sucralfate 1 g tablet Commonly known as: Carafate Take 1 tablet (1 g total) by mouth 4 (four) times daily -  with meals and at bedtime.       Allergies  Allergen Reactions   Hydralazine Other (See Comments)    Breathing   Nalbuphine Rash   Paroxetine Itching   Sulfa Antibiotics Other (See Comments)    Childhood reaction.   Venlafaxine Itching    The results of significant  diagnostics from this hospitalization (including imaging, microbiology, ancillary and laboratory) are listed below for reference.    Significant Diagnostic Studies: No results found.  Microbiology: Recent Results (from the past 240 hour(s))  SARS CORONAVIRUS 2 (TAT 6-24 HRS) Nasopharyngeal Nasopharyngeal Swab     Status: None   Collection Time: 10/24/20  1:50 PM   Specimen: Nasopharyngeal Swab  Result Value Ref Range Status   SARS Coronavirus 2 NEGATIVE NEGATIVE Final    Comment: (NOTE) SARS-CoV-2 target nucleic acids are NOT DETECTED.  The SARS-CoV-2 RNA is generally detectable in upper and lower respiratory specimens during the acute phase of infection. Negative results do not preclude SARS-CoV-2 infection, do not rule out co-infections with other pathogens, and should not be used as the sole basis for treatment or other patient management decisions. Negative results must be combined with clinical observations, patient history, and epidemiological information. The expected result is Negative.  Fact Sheet for Patients: SugarRoll.be  Fact Sheet for Healthcare Providers: https://www.woods-mathews.com/  This test is not yet approved or cleared by the [place] FDA and  has been authorized for detection and/or diagnosis of SARS-CoV-2 by FDA under an Emergency Use Authorization (EUA). This EUA will remain  in effect (meaning this test can be used) for the duration of the COVID-19 declaration under Se ction 564(b)(1) of the Act, 21 U.S.C. section 360bbb-3(b)(1), unless the authorization is terminated or revoked sooner.  Performed at [facility], [place] [address], [place], [place] [address]   Gastrointestinal Panel by PCR , Stool     Status: None   Collection Time: 10/24/20  6:05 PM   Specimen: Stool  Result Value Ref Range Status   Campylobacter species NOT DETECTED NOT DETECTED Final   Plesimonas shigelloides NOT DETECTED NOT  DETECTED Final   Salmonella species NOT DETECTED NOT DETECTED Final   Yersinia enterocolitica NOT DETECTED NOT DETECTED Final   Vibrio species NOT DETECTED NOT DETECTED Final   Vibrio cholerae NOT DETECTED NOT DETECTED Final   Enteroaggregative E coli (EAEC) NOT DETECTED NOT DETECTED Final   Enteropathogenic E coli (EPEC) NOT DETECTED NOT DETECTED Final   Enterotoxigenic E coli (ETEC) NOT DETECTED NOT DETECTED Final   Shiga like toxin producing E coli (STEC) NOT DETECTED NOT DETECTED Final   Shigella/Enteroinvasive E coli (EIEC) NOT DETECTED NOT DETECTED Final   Cryptosporidium NOT DETECTED NOT DETECTED Final   Cyclospora cayetanensis NOT DETECTED NOT DETECTED Final   Entamoeba histolytica NOT DETECTED NOT DETECTED Final   Giardia lamblia NOT DETECTED NOT DETECTED Final   Adenovirus F40/41 NOT DETECTED NOT DETECTED Final   Astrovirus NOT DETECTED NOT DETECTED Final   Norovirus GI/GII NOT DETECTED NOT DETECTED Final   Rotavirus A NOT DETECTED NOT DETECTED Final   Sapovirus (I, II, IV, and V) NOT DETECTED NOT DETECTED Final    Comment: Performed at [facility], [address] [place],  [place] [address]     Labs: Basic Metabolic Panel: Recent Labs  Lab 10/24/20 1204 10/24/20 1209 10/25/20 0534 10/26/20 0442 10/27/20 0402  NA 133* 132* 140 139 142  K 3.6 3.6 3.3* 3.0* 3.8  CL 97* 97* 105 109 113*  CO2 19*  --  18* 18* 18*  GLUCOSE 105* 101* 94 91 96  BUN 176* >130* 147* 117* 93*  CREATININE 8.09* 8.90* 6.10* 4.55* 3.49*  CALCIUM 8.8*  --  9.2 8.9 9.5  MG  --   --  1.9 1.9 1.8   Liver Function Tests: Recent Labs  Lab 10/24/20 1204 10/25/20 0534  AST 17 23  ALT 10 11  ALKPHOS 49 46  BILITOT 1.1 1.0  PROT 6.2* 6.3*  ALBUMIN 3.4* 3.3*   CBC: Recent Labs  Lab 10/24/20 1204 10/24/20 1209 10/25/20 0534  WBC 7.3  --  6.0  NEUTROABS 4.6  --   --   HGB 11.8* 11.9* 11.4*  HCT 35.4* 35.0* 34.4*  MCV 89.8  --  88.2  PLT 207  --  199    Signed:  Barton Dubois MD.  [facility] 10/27/2020, 12:52 PM

## 2020-10-27 NOTE — Progress Notes (Signed)
Palliative: [name] is sitting up in bed with nursing staff and her 2 sons at bedside.  She appears acutely/chronically ill and quite frail.  She will briefly make but not keep eye contact.  She is oriented to self only, but I believe she can make her basic needs known.  Nursing staff is at bedside, and they share that [name] has had pain medicine this morning which effectively relieved her pain.  I asked [name] if she slept well last night, she endorses that she has.  I also ask if she feels that she is getting enough to eat and drink.  She tells me that she is.  Sons at bedside realized that [name] is no longer taking adequate amounts of food and drink.  They understand that she has worsening kidney disease.  Family is agreeable to residential hospice for comfort and dignity, let nature take its course.  Meeting with sons in hallway.  Elspeth Cho shares that he has been contacted by hospice, and they are sending someone to evaluate [name].  Transition of care team updated.  Conference with attending, transition of care team related to patient condition, needs, goals of care, disposition.  Plan: Residential hospice for comfort and dignity at end-of-life, let nature take its course.  41 minutes Quinn Axe, NP Palliative medicine team Team phone [phone] Greater than 50% of this time was spent counseling and coordinating care related to the above assessment and plan.

## 2020-10-28 ENCOUNTER — Encounter (HOSPITAL_COMMUNITY): Payer: Self-pay | Admitting: Internal Medicine

## 2020-10-28 ENCOUNTER — Other Ambulatory Visit: Payer: Self-pay

## 2020-10-28 MED ORDER — ALUM & MAG HYDROXIDE-SIMETH 200-200-20 MG/5ML PO SUSP
30.0000 mL | Freq: Four times a day (QID) | ORAL | Status: DC | PRN
  Filled 2020-10-28: qty 30

## 2020-10-28 MED ORDER — HYDROCODONE-ACETAMINOPHEN 7.5-325 MG PO TABS
1.0000 | ORAL_TABLET | Freq: Four times a day (QID) | ORAL | Status: DC | PRN
  Administered 2020-10-28 – 2020-11-03 (×10): 1 via ORAL
  Filled 2020-10-28 (×11): qty 1

## 2020-10-28 MED ORDER — FAMOTIDINE 20 MG PO TABS
20.0000 mg | ORAL_TABLET | Freq: Every day | ORAL | Status: DC
  Administered 2020-10-28 – 2020-11-02 (×6): 20 mg via ORAL
  Filled 2020-10-28 (×7): qty 1

## 2020-10-28 NOTE — Progress Notes (Signed)
PROGRESS NOTE    [name]  RCV:[id number] DOB: [birth year] DOA: 10/27/2020 PCP: Lindell Spar, MD   Brief Narrative:   [name] is a 85 y.o. female with medical history significant for COPD, CAD, hypertension, dyslipidemia, CKD stage IV, fibromyalgia, and osteoarthritis who was brought to the ED due to not eating or drinking anything for the last 1 week and now has increased weakness.  Patient was admitted for AKI on CKD stage IV as well as suspected failure to thrive with palliative consultation pending.  Continues to remain on IV fluid with creatinine levels improving.  She continues not to have any p.o. intake.  Assessment & Plan:   Active Problems:   Admission for end of life care   AKI on CKD stage IV-suspect prerenal; improving -Patient appears to be on chlorthalidone as well as potentially Lasix and now has had poor oral intake -Baseline creatinine around 3-4 and on admission creatinine up to 8.9 -after fluids and minimizing nephrotoxic agents Cr down to 3.49 -patient continue to have poor oral intake; BUN still elevated -decided for not invasive intervention and plan is to pursued symptomatic management and comfort care. -Patient will be discharged to hospice facility.  Failure to thrive -Continues to have poor p.o. intake -Limited social interactions -Appreciate palliative consultation with ongoing family discussions  -Plan of care is for symptomatic management only at residential hospice placement.   Mild hypokalemia -Most likely in the setting of GI losses/diarrhea -Electrolytes were repleted. -No further blood work anticipated at this time -Continue to focus on symptomatic management only.   Diarrhea-resolved -GI panel negative -Negative C. Diff -Continue symptomatic management.   Elevated serum free light chains -Likely related to CKD -No further hematology follow-up recommended -Metastatic survey negative for lytic lesions -Plan of care and  symptomatic management only. -Comfort care/hospice.   History of CAD/dyslipidemia -Patient with intermittent episodes of bradycardia; beta-blocker were discontinued. -No chest pain -Following goals of care discussion will focus on symptomatic management only -Medications not intended for symptomatic relief has been discontinued at this point.   History of hypertension -Blood pressure/vital signs are stable -Holding any medication not intended for symptomatic management at this time.   History of COPD -No acute bronchospasms noted -Continue breathing treatments as needed   History of hypothyroidism -Following goals of care discussion/decision will focus on symptomatic management only. -Synthroid has been discontinued. -TSH 1.466   History of fibromyalgia -Continue to hold Cymbalta at this time -Focusing on symptomatic management only.   History of osteoarthritis -Continue as needed analgesic therapy.   GERD -Continue famotidine.       DVT prophylaxis: Heparin Code Status: DNR Family Communication: Spoke with son at bedside 8/4 Disposition Plan:  Status is: Inpatient   Remains inpatient appropriate because:IV treatments appropriate due to intensity of illness or inability to take PO and Inpatient level of care appropriate due to severity of illness   Dispo: The patient is from: Home              Anticipated d/c is to: Home hospice vs residential hospice              Patient currently is not medically stable to d/c.              Difficult to place patient No   Consultants:  Palliative   Procedures:  See below   Antimicrobials:  None  Subjective: Seen at bedside; no chest pain, no nausea or vomiting.  Very deconditioned and  frail, still with no significant oral intake.  Objective: Vitals:   10/28/20 0416 10/28/20 1316 10/28/20 1455  BP: (!) 154/83 (!) 151/80 (!) 151/80  Pulse: 67 60 60  Resp: 15 16 16   Temp: 98.4 F (36.9 C) 98.7 F (37.1 C) 98.7 F  (37.1 C)  TempSrc: Oral Oral Oral  SpO2: 98% 98%   Weight:   52.9 kg  Height:   5' (1.524 m)    Intake/Output Summary (Last 24 hours) at 10/28/2020 1625 Last data filed at 10/28/2020 1316 Gross per 24 hour  Intake 360 ml  Output 550 ml  Net -190 ml   Filed Weights   10/28/20 1455  Weight: 52.9 kg    Examination: General exam: Alert, awake, oriented x 1; expressing ongoing intermittent pain still with very poor appetite.  Chronically ill and very frail.  Very Deconditioned Respiratory system: Clear to auscultation. Respiratory effort normal.  No requiring oxygen supplementation.  No using accessory muscle. Cardiovascular system:RRR. No rubs, gallops or JVD. Gastrointestinal system: Abdomen is nondistended, soft and nontender. No organomegaly or masses felt. Normal bowel sounds heard. Central nervous system: No focal neurological deficits. Extremities: No cyanosis or clubbing. Skin: No petechiae. Psychiatry: Flat affect; no hallucinations.    Data Reviewed: I have personally reviewed following labs and imaging studies  CBC: Recent Labs  Lab 10/24/20 1204 10/24/20 1209 10/25/20 0534  WBC 7.3  --  6.0  NEUTROABS 4.6  --   --   HGB 11.8* 11.9* 11.4*  HCT 35.4* 35.0* 34.4*  MCV 89.8  --  88.2  PLT 207  --  376   Basic Metabolic Panel: Recent Labs  Lab 10/24/20 1204 10/24/20 1209 10/25/20 0534 10/26/20 0442 10/27/20 0402  NA 133* 132* 140 139 142  K 3.6 3.6 3.3* 3.0* 3.8  CL 97* 97* 105 109 113*  CO2 19*  --  18* 18* 18*  GLUCOSE 105* 101* 94 91 96  BUN 176* >130* 147* 117* 93*  CREATININE 8.09* 8.90* 6.10* 4.55* 3.49*  CALCIUM 8.8*  --  9.2 8.9 9.5  MG  --   --  1.9 1.9 1.8   GFR: Estimated Creatinine Clearance: 8.9 mL/min (A) (by C-G formula based on SCr of 3.49 mg/dL (H)). Liver Function Tests: Recent Labs  Lab 10/24/20 1204 10/25/20 0534  AST 17 23  ALT 10 11  ALKPHOS 49 46  BILITOT 1.1 1.0  PROT 6.2* 6.3*  ALBUMIN 3.4* 3.3*    Sepsis  Labs: Recent Labs  Lab 10/24/20 1315  LATICACIDVEN 0.8    Recent Results (from the past 240 hour(s))  SARS CORONAVIRUS 2 (TAT 6-24 HRS) Nasopharyngeal Nasopharyngeal Swab     Status: None   Collection Time: 10/24/20  1:50 PM   Specimen: Nasopharyngeal Swab  Result Value Ref Range Status   SARS Coronavirus 2 NEGATIVE NEGATIVE Final    Comment: (NOTE) SARS-CoV-2 target nucleic acids are NOT DETECTED.  The SARS-CoV-2 RNA is generally detectable in upper and lower respiratory specimens during the acute phase of infection. Negative results do not preclude SARS-CoV-2 infection, do not rule out co-infections with other pathogens, and should not be used as the sole basis for treatment or other patient management decisions. Negative results must be combined with clinical observations, patient history, and epidemiological information. The expected result is Negative.  Fact Sheet for Patients: SugarRoll.be  Fact Sheet for Healthcare Providers: https://www.woods-mathews.com/  This test is not yet approved or cleared by the [place] and  has been authorized  for detection and/or diagnosis of SARS-CoV-2 by FDA under an Emergency Use Authorization (EUA). This EUA will remain  in effect (meaning this test can be used) for the duration of the COVID-19 declaration under Se ction 564(b)(1) of the Act, 21 U.S.C. section 360bbb-3(b)(1), unless the authorization is terminated or revoked sooner.  Performed at [facility], [place] [address] [place], [place] [address]   Gastrointestinal Panel by PCR , Stool     Status: None   Collection Time: 10/24/20  6:05 PM   Specimen: Stool  Result Value Ref Range Status   Campylobacter species NOT DETECTED NOT DETECTED Final   Plesimonas shigelloides NOT DETECTED NOT DETECTED Final   Salmonella species NOT DETECTED NOT DETECTED Final   Yersinia enterocolitica NOT DETECTED NOT DETECTED Final   Vibrio  species NOT DETECTED NOT DETECTED Final   Vibrio cholerae NOT DETECTED NOT DETECTED Final   Enteroaggregative E coli (EAEC) NOT DETECTED NOT DETECTED Final   Enteropathogenic E coli (EPEC) NOT DETECTED NOT DETECTED Final   Enterotoxigenic E coli (ETEC) NOT DETECTED NOT DETECTED Final   Shiga like toxin producing E coli (STEC) NOT DETECTED NOT DETECTED Final   Shigella/Enteroinvasive E coli (EIEC) NOT DETECTED NOT DETECTED Final   Cryptosporidium NOT DETECTED NOT DETECTED Final   Cyclospora cayetanensis NOT DETECTED NOT DETECTED Final   Entamoeba histolytica NOT DETECTED NOT DETECTED Final   Giardia lamblia NOT DETECTED NOT DETECTED Final   Adenovirus F40/41 NOT DETECTED NOT DETECTED Final   Astrovirus NOT DETECTED NOT DETECTED Final   Norovirus GI/GII NOT DETECTED NOT DETECTED Final   Rotavirus A NOT DETECTED NOT DETECTED Final   Sapovirus (I, II, IV, and V) NOT DETECTED NOT DETECTED Final    Comment: Performed at [facility], [address], [place], [place] [address]     Radiology Studies: No results found.  Scheduled Meds:  famotidine  20 mg Oral Daily   melatonin  3 mg Oral QHS   Continuous Infusions:     LOS: 1 day    Time spent: 30 minutes    Barton Dubois, MD [facility]  If 7PM-7AM, please contact night-coverage [email or web address] 10/28/2020, 4:25 PM

## 2020-10-28 NOTE — Progress Notes (Signed)
Palliative: [name] is lying quietly in bed.  She greets me making and somewhat keeping eye contact.  She appears acutely/chronically ill and quite frail.  She is alert, oriented to self only.  I am not sure that she can make her basic needs known.  Her son is at bedside.  [name] tells me that she slept well last night, and is satisfied with her food and liquid intake.  Her son states that she ate a piece of bacon and a few bites of toast that he had gotten for her yesterday.  She is eating a few bites of food per day, but not enough to sustain her, especially in light of her acute on chronic kidney injury.  When asked about pain, she tells me that she constantly has pain.  Medications adjusted.  Conference with attending, bedside nursing staff, transition of care team related to patient condition, needs, goals of care, disposition. DNR/goldenrod form completed.  Plan: Comfort care.  Now GIP status, residential hospice with [facility].  25 minutes  Quinn Axe, NP Palliative medicine team Team phone [phone] Greater than 50% of this time was spent counseling and coordinating care related to the above assessment and plan.

## 2020-10-29 LAB — GLUCOSE, CAPILLARY: Glucose-Capillary: 102 mg/dL — ABNORMAL HIGH (ref 70–99)

## 2020-10-29 NOTE — Clinical Social Work Note (Signed)
Patient does not desire Hospice. Hospice has been revoked. Son, Jenny Reichmann, states that patient wants to go to rehab but still does not want to do HD. Discussed that patient may not received rehab auth due to being Hospice appropriate. John desires to try for rehab.  Attending notified of change in plan and need for PT consult.   Sabina Beavers, Clydene Pugh, LCSW

## 2020-10-29 NOTE — Progress Notes (Signed)
PROGRESS NOTE    [name]  VEH:[id number] DOB: 1935-06-22 DOA: 10/27/2020 PCP: Lindell Spar, MD   Brief Narrative:   [name] is a 85 y.o. female with medical history significant for COPD, CAD, hypertension, dyslipidemia, CKD stage IV, fibromyalgia, and osteoarthritis who was brought to the ED due to not eating or drinking anything for the last 1 week and now has increased weakness.  Patient was admitted for AKI on CKD stage IV as well as suspected failure to thrive with palliative consultation pending.  Continues to remain on IV fluid with creatinine levels improving.  She continues not to have any p.o. intake.  Assessment & Plan:   Active Problems:   Admission for end of life care   AKI on CKD stage IV-suspect prerenal; improving -Patient appears to be on chlorthalidone as well as potentially Lasix and now has had poor oral intake -Baseline creatinine around 3-4 and on admission creatinine up to 8.9 -after fluids and minimizing nephrotoxic agents Cr down to 3.49 -patient continue to have poor oral intake; BUN still elevated -decided for not invasive intervention and plan is to pursued symptomatic management and comfort care. -Patient will be discharged to hospice facility.  Failure to thrive -Continues to have poor p.o. intake -Limited social interactions -Appreciate palliative consultation with ongoing family discussions  -Plan of care is for symptomatic management only at residential hospice placement.   Mild hypokalemia -Most likely in the setting of GI losses/diarrhea -Electrolytes were repleted. -No further blood work anticipated at this time -Continue to focus on symptomatic management only.   Diarrhea-resolved -GI panel negative -Negative C. Diff -Continue symptomatic management.   Elevated serum free light chains -Likely related to CKD -No further hematology follow-up recommended -Metastatic survey negative for lytic lesions -Plan of care and  symptomatic management only. -Comfort care/hospice.   History of CAD/dyslipidemia -Patient with intermittent episodes of bradycardia; beta-blocker were discontinued. -No chest pain -Following goals of care discussion will focus on symptomatic management only -Medications not intended for symptomatic relief has been discontinued at this point.   History of hypertension -Blood pressure/vital signs are stable -Holding any medication not intended for symptomatic management at this time.   History of COPD -No acute bronchospasms noted -Continue breathing treatments as needed   History of hypothyroidism -Following goals of care discussion/decision will focus on symptomatic management only. -Synthroid has been discontinued. -TSH 1.466   History of fibromyalgia -Continue to hold Cymbalta at this time -Focusing on symptomatic management only.   History of osteoarthritis -Continue as needed analgesic therapy. -Chronic and overall unchanged.   GERD -Continue famotidine and as needed Maalox.       DVT prophylaxis: Heparin Code Status: DNR Family Communication: Spoke with son at bedside 8/5 Disposition Plan:  Status is: Inpatient   Remains inpatient appropriate because:IV treatments appropriate due to intensity of illness or inability to take PO and Inpatient level of care appropriate due to severity of illness   Dispo: The patient is from: Home              Anticipated d/c is to: Home hospice vs residential hospice              Patient currently is not medically stable to d/c.              Difficult to place patient No   Consultants:  Palliative   Procedures:  See below   Antimicrobials:  None  Subjective: Comfortable overall; in no acute distress.  Patient is afebrile.  Stable vital signs.  Still no eating.  Objective: Vitals:   10/28/20 1316 10/28/20 1455 10/28/20 2103 10/29/20 0400  BP: (!) 151/80 (!) 151/80 (!) 149/90 (!) 149/82  Pulse: 60 60 69 67  Resp: 16  16 16 18   Temp: 98.7 F (37.1 C) 98.7 F (37.1 C) 98.1 F (36.7 C) 98.1 F (36.7 C)  TempSrc: Oral Oral Oral Oral  SpO2: 98%  98% 100%  Weight:  52.9 kg    Height:  5' (1.524 m)      Intake/Output Summary (Last 24 hours) at 10/29/2020 1153 Last data filed at 10/29/2020 1020 Gross per 24 hour  Intake 240 ml  Output 200 ml  Net 40 ml   Filed Weights   10/28/20 1455  Weight: 52.9 kg    Examination: General exam: Alert, awake, oriented x 1; in no major distress.  Following commands appropriately.  No nausea, no vomiting.  Reports that yesterday intermittent episode of GI upset symptoms.  Eating very little not to say none.  Respiratory system: Clear to auscultation. Respiratory effort normal.  Using accessory muscles; good saturation on room air appreciated. Cardiovascular system:RRR. No rubs or gallops; no JVD. Gastrointestinal system: Abdomen is nondistended, soft and nontender. No organomegaly or masses felt. Normal bowel sounds heard. Central nervous system: No focal neurological deficits. Extremities: No cyanosis or clubbing. Skin: No rashes, no petechiae. Psychiatry: Flat affect appreciated; no hallucinations.    Data Reviewed: I have personally reviewed following labs and imaging studies  CBC: Recent Labs  Lab 10/24/20 1204 10/24/20 1209 10/25/20 0534  WBC 7.3  --  6.0  NEUTROABS 4.6  --   --   HGB 11.8* 11.9* 11.4*  HCT 35.4* 35.0* 34.4*  MCV 89.8  --  88.2  PLT 207  --  832   Basic Metabolic Panel: Recent Labs  Lab 10/24/20 1204 10/24/20 1209 10/25/20 0534 10/26/20 0442 10/27/20 0402  NA 133* 132* 140 139 142  K 3.6 3.6 3.3* 3.0* 3.8  CL 97* 97* 105 109 113*  CO2 19*  --  18* 18* 18*  GLUCOSE 105* 101* 94 91 96  BUN 176* >130* 147* 117* 93*  CREATININE 8.09* 8.90* 6.10* 4.55* 3.49*  CALCIUM 8.8*  --  9.2 8.9 9.5  MG  --   --  1.9 1.9 1.8   GFR: Estimated Creatinine Clearance: 8.9 mL/min (A) (by C-G formula based on SCr of 3.49 mg/dL (H)). Liver  Function Tests: Recent Labs  Lab 10/24/20 1204 10/25/20 0534  AST 17 23  ALT 10 11  ALKPHOS 49 46  BILITOT 1.1 1.0  PROT 6.2* 6.3*  ALBUMIN 3.4* 3.3*    Sepsis Labs: Recent Labs  Lab 10/24/20 1315  LATICACIDVEN 0.8    Recent Results (from the past 240 hour(s))  SARS CORONAVIRUS 2 (TAT 6-24 HRS) Nasopharyngeal Nasopharyngeal Swab     Status: None   Collection Time: 10/24/20  1:50 PM   Specimen: Nasopharyngeal Swab  Result Value Ref Range Status   SARS Coronavirus 2 NEGATIVE NEGATIVE Final    Comment: (NOTE) SARS-CoV-2 target nucleic acids are NOT DETECTED.  The SARS-CoV-2 RNA is generally detectable in upper and lower respiratory specimens during the acute phase of infection. Negative results do not preclude SARS-CoV-2 infection, do not rule out co-infections with other pathogens, and should not be used as the sole basis for treatment or other patient management decisions. Negative results must be combined with clinical observations, patient history, and epidemiological information. The  expected result is Negative.  Fact Sheet for Patients: SugarRoll.be  Fact Sheet for Healthcare Providers: https://www.woods-mathews.com/  This test is not yet approved or cleared by the [place] FDA and  has been authorized for detection and/or diagnosis of SARS-CoV-2 by FDA under an Emergency Use Authorization (EUA). This EUA will remain  in effect (meaning this test can be used) for the duration of the COVID-19 declaration under Se ction 564(b)(1) of the Act, 21 U.S.C. section 360bbb-3(b)(1), unless the authorization is terminated or revoked sooner.  Performed at [facility], [place] [address], [place], [place] [address]   Gastrointestinal Panel by PCR , Stool     Status: None   Collection Time: 10/24/20  6:05 PM   Specimen: Stool  Result Value Ref Range Status   Campylobacter species NOT DETECTED NOT DETECTED Final    Plesimonas shigelloides NOT DETECTED NOT DETECTED Final   Salmonella species NOT DETECTED NOT DETECTED Final   Yersinia enterocolitica NOT DETECTED NOT DETECTED Final   Vibrio species NOT DETECTED NOT DETECTED Final   Vibrio cholerae NOT DETECTED NOT DETECTED Final   Enteroaggregative E coli (EAEC) NOT DETECTED NOT DETECTED Final   Enteropathogenic E coli (EPEC) NOT DETECTED NOT DETECTED Final   Enterotoxigenic E coli (ETEC) NOT DETECTED NOT DETECTED Final   Shiga like toxin producing E coli (STEC) NOT DETECTED NOT DETECTED Final   Shigella/Enteroinvasive E coli (EIEC) NOT DETECTED NOT DETECTED Final   Cryptosporidium NOT DETECTED NOT DETECTED Final   Cyclospora cayetanensis NOT DETECTED NOT DETECTED Final   Entamoeba histolytica NOT DETECTED NOT DETECTED Final   Giardia lamblia NOT DETECTED NOT DETECTED Final   Adenovirus F40/41 NOT DETECTED NOT DETECTED Final   Astrovirus NOT DETECTED NOT DETECTED Final   Norovirus GI/GII NOT DETECTED NOT DETECTED Final   Rotavirus A NOT DETECTED NOT DETECTED Final   Sapovirus (I, II, IV, and V) NOT DETECTED NOT DETECTED Final    Comment: Performed at [facility], [address], [place], [place] [address]     Radiology Studies: No results found.  Scheduled Meds:  famotidine  20 mg Oral Daily   melatonin  3 mg Oral QHS   Continuous Infusions:     LOS: 2 days    Time spent: 30 minutes    Barton Dubois, MD [facility]  If 7PM-7AM, please contact night-coverage [email or web address] 10/29/2020, 11:53 AM

## 2020-10-30 LAB — URINALYSIS, ROUTINE W REFLEX MICROSCOPIC
Bilirubin Urine: NEGATIVE
Glucose, UA: NEGATIVE mg/dL
Ketones, ur: NEGATIVE mg/dL
Nitrite: POSITIVE — AB
Protein, ur: 100 mg/dL — AB
Specific Gravity, Urine: 1.018 (ref 1.005–1.030)
WBC, UA: >50 WBC/hpf — ABNORMAL HIGH (ref 0–5)
pH: 6 (ref 5.0–8.0)

## 2020-10-30 MED ORDER — MIRTAZAPINE 15 MG PO TABS
7.5000 mg | ORAL_TABLET | Freq: Every day | ORAL | Status: DC
  Administered 2020-10-30 – 2020-11-02 (×4): 7.5 mg via ORAL
  Filled 2020-10-30 (×4): qty 1

## 2020-10-30 MED ORDER — LEVOTHYROXINE SODIUM 88 MCG PO TABS
88.0000 ug | ORAL_TABLET | Freq: Every day | ORAL | Status: DC
  Administered 2020-10-31 – 2020-11-02 (×3): 88 ug via ORAL
  Filled 2020-10-30 (×4): qty 1

## 2020-10-30 MED ORDER — DULOXETINE HCL 30 MG PO CPEP
30.0000 mg | ORAL_CAPSULE | Freq: Every day | ORAL | Status: DC
  Administered 2020-10-30 – 2020-11-02 (×4): 30 mg via ORAL
  Filled 2020-10-30 (×5): qty 1

## 2020-10-30 NOTE — Progress Notes (Signed)
PROGRESS NOTE    [name]  JKD:[id number] DOB: 02/19/1936 DOA: 10/27/2020 PCP: Lindell Spar, MD   Brief Narrative:   [name] is a 85 y.o. female with medical history significant for COPD, CAD, hypertension, dyslipidemia, CKD stage IV, fibromyalgia, and osteoarthritis who was brought to the ED due to not eating or drinking anything for the last 1 week and now has increased weakness.  Patient was admitted for AKI on CKD stage IV as well as suspected failure to thrive with palliative consultation pending.  Continues to remain on IV fluid with creatinine levels improving.  She continues not to have any p.o. intake.  Assessment & Plan:   Active Problems:   Admission for end of life care   AKI on CKD stage IV-suspect prerenal; improving -Patient appears to be on chlorthalidone as well as potentially Lasix and now has had poor oral intake -Baseline creatinine around 3-4 and on admission creatinine up to 8.9 -after fluids and minimizing nephrotoxic agents Cr down to 3.49 -patient continue to have poor oral intake; BUN still elevated -Patient has declined hemodialysis, but at the same time revoke hospice.  She would like to pursue rehabilitation and return home. -Continue supportive care.  Failure to thrive -Continues to have poor p.o. intake -Continue to encourage increase oral intake -Low serum Remeron will be added to her regimen.   Mild hypokalemia -Most likely in the setting of GI losses/diarrhea -Electrolytes were repleted. -Now the patient has revoked hospice will reassess basic metabolic panel in a.m. and follow electrolytes trend for further repletion.   Diarrhea-resolved -GI panel negative -Negative C. Diff -Continue symptomatic management.   Elevated serum free light chains -Likely related to CKD -No further hematology follow-up recommended -Metastatic survey negative for lytic lesions -Main goal was initially comfort care; patient has now revoked hospice  and would like to proceed rehabilitation. -Will recommend outpatient follow-up based on her progress and further clinical response.   History of CAD/dyslipidemia -Patient with intermittent episodes of bradycardia; beta-blocker were discontinued. -No chest pain   History of hypertension -Blood pressure/vital signs are stable -Continue to follow vital signs. -If needed will resume antihypertensive agents.   History of COPD -No acute bronchospasms noted -Continue breathing treatments as needed   History of hypothyroidism -TSH 1.466 -Patient has revoke hospice and will resume Synthroid.   History of fibromyalgia -Will resume Cymbalta and low-dose Remeron to assist with appetite.   History of osteoarthritis -Continue as needed analgesic therapy. -Chronic and overall unchanged.   GERD -Continue famotidine and as needed Maalox.       DVT prophylaxis: Heparin Code Status: DNR Family Communication: Spoke with son at bedside 8/6 Disposition Plan:  Status is: Inpatient   Remains inpatient appropriate because:IV treatments appropriate due to intensity of illness or inability to take PO and Inpatient level of care appropriate due to severity of illness   Dispo: The patient is from: Home              Anticipated d/c is to: LTC placement for rehabilitation.              Patient currently is medically stable for discharge.              Difficult to place patient No   Consultants:  Palliative   Procedures:  See below   Antimicrobials:  None  Subjective: Comfortable, reporting mild dysuria.  Following commands appropriately.  Still no eating or drinking enough to maintain adequate nutrition and hydration.  Patient has revoked hospice.  Objective: Vitals:   10/28/20 2103 10/29/20 0400 10/29/20 1320 10/30/20 0547  BP: (!) 149/90 (!) 149/82 130/78 123/75  Pulse: 69 67 71 78  Resp: 16 18 18 20   Temp: 98.1 F (36.7 C) 98.1 F (36.7 C) 98.2 F (36.8 C) 98.9 F (37.2 C)   TempSrc: Oral Oral Oral Oral  SpO2: 98% 100% 97% 97%  Weight:      Height:        Intake/Output Summary (Last 24 hours) at 10/30/2020 1633 Last data filed at 10/30/2020 1300 Gross per 24 hour  Intake 300 ml  Output --  Net 300 ml   Filed Weights   10/28/20 1455  Weight: 52.9 kg    Examination: General exam: Alert, more lucid and following commands appropriately.  Patient has revoke hospice and at this time would like to pursued rehabilitation and to return home.  Still no eating or drinking much.  Reports mild dysuria to nursing staff. Respiratory system: Clear to auscultation. Respiratory effort normal.  No using accessory muscle.  Good saturation on room air. Cardiovascular system:RRR. No rubs or gallops.  No JVD. Gastrointestinal system: Abdomen is nondistended, soft and nontender. No organomegaly or masses felt. Normal bowel sounds heard. Central nervous system: No focal neurological deficits. Extremities: No cyanosis or clubbing. Skin: No rashes, lesions or ulcers Psychiatry:Mood & affect appropriate.   Data Reviewed: I have personally reviewed following labs and imaging studies  CBC: Recent Labs  Lab 10/24/20 1204 10/24/20 1209 10/25/20 0534  WBC 7.3  --  6.0  NEUTROABS 4.6  --   --   HGB 11.8* 11.9* 11.4*  HCT 35.4* 35.0* 34.4*  MCV 89.8  --  88.2  PLT 207  --  099   Basic Metabolic Panel: Recent Labs  Lab 10/24/20 1204 10/24/20 1209 10/25/20 0534 10/26/20 0442 10/27/20 0402  NA 133* 132* 140 139 142  K 3.6 3.6 3.3* 3.0* 3.8  CL 97* 97* 105 109 113*  CO2 19*  --  18* 18* 18*  GLUCOSE 105* 101* 94 91 96  BUN 176* >130* 147* 117* 93*  CREATININE 8.09* 8.90* 6.10* 4.55* 3.49*  CALCIUM 8.8*  --  9.2 8.9 9.5  MG  --   --  1.9 1.9 1.8   GFR: Estimated Creatinine Clearance: 8.9 mL/min (A) (by C-G formula based on SCr of 3.49 mg/dL (H)). Liver Function Tests: Recent Labs  Lab 10/24/20 1204 10/25/20 0534  AST 17 23  ALT 10 11  ALKPHOS 49 46  BILITOT  1.1 1.0  PROT 6.2* 6.3*  ALBUMIN 3.4* 3.3*    Sepsis Labs: Recent Labs  Lab 10/24/20 1315  LATICACIDVEN 0.8    Recent Results (from the past 240 hour(s))  SARS CORONAVIRUS 2 (TAT 6-24 HRS) Nasopharyngeal Nasopharyngeal Swab     Status: None   Collection Time: 10/24/20  1:50 PM   Specimen: Nasopharyngeal Swab  Result Value Ref Range Status   SARS Coronavirus 2 NEGATIVE NEGATIVE Final    Comment: (NOTE) SARS-CoV-2 target nucleic acids are NOT DETECTED.  The SARS-CoV-2 RNA is generally detectable in upper and lower respiratory specimens during the acute phase of infection. Negative results do not preclude SARS-CoV-2 infection, do not rule out co-infections with other pathogens, and should not be used as the sole basis for treatment or other patient management decisions. Negative results must be combined with clinical observations, patient history, and epidemiological information. The expected result is Negative.  Fact Sheet for Patients: SugarRoll.be  Fact Sheet for Healthcare Providers: https://www.woods-mathews.com/  This test is not yet approved or cleared by the [place] FDA and  has been authorized for detection and/or diagnosis of SARS-CoV-2 by FDA under an Emergency Use Authorization (EUA). This EUA will remain  in effect (meaning this test can be used) for the duration of the COVID-19 declaration under Se ction 564(b)(1) of the Act, 21 U.S.C. section 360bbb-3(b)(1), unless the authorization is terminated or revoked sooner.  Performed at [facility], [place] [address] [place], [place] [address]   Gastrointestinal Panel by PCR , Stool     Status: None   Collection Time: 10/24/20  6:05 PM   Specimen: Stool  Result Value Ref Range Status   Campylobacter species NOT DETECTED NOT DETECTED Final   Plesimonas shigelloides NOT DETECTED NOT DETECTED Final   Salmonella species NOT DETECTED NOT DETECTED Final   Yersinia  enterocolitica NOT DETECTED NOT DETECTED Final   Vibrio species NOT DETECTED NOT DETECTED Final   Vibrio cholerae NOT DETECTED NOT DETECTED Final   Enteroaggregative E coli (EAEC) NOT DETECTED NOT DETECTED Final   Enteropathogenic E coli (EPEC) NOT DETECTED NOT DETECTED Final   Enterotoxigenic E coli (ETEC) NOT DETECTED NOT DETECTED Final   Shiga like toxin producing E coli (STEC) NOT DETECTED NOT DETECTED Final   Shigella/Enteroinvasive E coli (EIEC) NOT DETECTED NOT DETECTED Final   Cryptosporidium NOT DETECTED NOT DETECTED Final   Cyclospora cayetanensis NOT DETECTED NOT DETECTED Final   Entamoeba histolytica NOT DETECTED NOT DETECTED Final   Giardia lamblia NOT DETECTED NOT DETECTED Final   Adenovirus F40/41 NOT DETECTED NOT DETECTED Final   Astrovirus NOT DETECTED NOT DETECTED Final   Norovirus GI/GII NOT DETECTED NOT DETECTED Final   Rotavirus A NOT DETECTED NOT DETECTED Final   Sapovirus (I, II, IV, and V) NOT DETECTED NOT DETECTED Final    Comment: Performed at [facility], [address] [place], [place] [address]     Radiology Studies: No results found.  Scheduled Meds:  famotidine  20 mg Oral Daily   melatonin  3 mg Oral QHS   Continuous Infusions:     LOS: 3 days    Time spent: 30 minutes    Barton Dubois, MD [facility]  If 7PM-7AM, please contact night-coverage [email or web address] 10/30/2020, 4:33 PM

## 2020-10-30 NOTE — TOC Progression Note (Signed)
Transition of Care [facility]) - Progression Note    Patient Details  Name: CHAPEL SILVERTHORN MRN: [id number] Date of Birth: 1936/03/09  [facility]) CM/SW Contact  Natasha Bence, LCSW Phone Number: 10/30/2020, 4:01 PM  Clinical Narrative:    CSW contacted patient's sonsto discuss PT eval and placement options. Patient's sons agreeable to LTC and reported that the patient has medicaid. Patient's son Jenny Reichmann also agreeable to signing over disability check for LTC. CSW notified family of limited bed availability in the region for LTC and explained that referrals will have to also be placed out of county. Family agreeable. CSW faxed out patient. TOC to follow.         Expected Discharge Plan and Services           Expected Discharge Date: 10/27/20                                     Social Determinants of Health (SDOH) Interventions    Readmission Risk Interventions No flowsheet data found.

## 2020-10-30 NOTE — Evaluation (Signed)
Physical Therapy Evaluation Patient Details Name: [name] MRN: [id number] DOB: [birth year] Today's Date: 10/30/2020   History of Present Illness  [name] is a 85 y.o. female with medical history significant for COPD, CAD, hypertension, dyslipidemia, CKD stage IV, fibromyalgia, and osteoarthritis who was brought to the ED due to not eating or drinking anything for the last 1 week and now has increased weakness.  She has had no abdominal pain, nausea, vomiting, diarrhea, fevers, chills, or cough.  She has had prior COVID vaccinations.  Son at bedside states that she is getting set up to see a nephrologist outpatient for advanced chronic kidney disease.  Patient has been taking her home medications routinely to include her diuretics.   Clinical Impression   Patient demonstrates generalized weakness and reduced activity tolerance requiring min A for mobility activities and use of RW and min A for support when ambulating in hospital room with wide BOS and unsteadiness on feet present.  Demonstrates limited gait endurance requiring prompt seated rest periods after ambulation x 10 ft.  Pt would benefit from continued PT services whilst hospitalized to improve strength, balance, and functional activity tolerance to facilitate safe return to home environment and would benefit from continued skilled services via HHPT to maintain functional improvements and to reduce risk for falls during mobility.     Follow Up Recommendations Home health PT    Equipment Recommendations  None recommended by PT    Recommendations for Other Services       Precautions / Restrictions        Mobility  Bed Mobility Overal bed mobility: Needs Assistance Bed Mobility: Supine to Sit;Sit to Supine     Supine to sit: Min assist Sit to supine: Supervision        Transfers Overall transfer level: Needs assistance Equipment used: Rolling walker (2 wheeled) Transfers: Sit to/from [employer] Sit to Stand: Min assist Stand pivot transfers: Min assist          Ambulation/Gait Ambulation/Gait assistance: Min assist Gait Distance (Feet): 10 Feet Assistive device: Rolling walker (2 wheeled) Gait Pattern/deviations: Decreased step length - right;Decreased step length - left;Wide base of support Gait velocity: decreased, head down posture      Stairs            Wheelchair Mobility    Modified Rankin (Stroke Patients Only)       Balance Overall balance assessment: Mild deficits observed, not formally tested                                           Pertinent Vitals/Pain Pain Assessment: 0-10 Pain Score: 5  Pain Location: generalized "all over" from arthritis Pain Descriptors / Indicators: Aching;Sore Pain Intervention(s): Limited activity within patient's tolerance    Home Living Family/patient expects to be discharged to:: Private residence Living Arrangements: Spouse/significant other Available Help at Discharge: Family Type of Home: House Home Access: Stairs to enter Entrance Stairs-Rails: None Entrance Stairs-Number of Steps: 1 Home Layout: One level Home Equipment: Environmental consultant - 2 wheels Additional Comments: pt and her son report she lives with husband and he (son) lives 3 miles away and is at their home 2-3 times a day to assist as needed    Prior Function Level of Independence: Independent with assistive device(s)         Comments: pt reports she is typically able  to perform household mobility with use of RW or rollator and perform toileting/hygiene without assistance. She uses rollator and supervision when walking outside and for excursions to MD offices     Hand Dominance        Extremity/Trunk Assessment   Upper Extremity Assessment Upper Extremity Assessment: Generalized weakness    Lower Extremity Assessment Lower Extremity Assessment: Generalized weakness       Communication   Communication: No  difficulties  Cognition Arousal/Alertness: Awake/alert Behavior During Therapy: WFL for tasks assessed/performed Overall Cognitive Status: Within Functional Limits for tasks assessed                                        General Comments      Exercises     Assessment/Plan    PT Assessment Patient needs continued PT services  PT Problem List Decreased strength;Decreased activity tolerance;Decreased balance;Decreased mobility       PT Treatment Interventions DME instruction;Gait training;Stair training;Functional mobility training;Therapeutic activities;Patient/family education;Balance training;Therapeutic exercise;Manual techniques    PT Goals (Current goals can be found in the Care Plan section)  Acute Rehab PT Goals Patient Stated Goal: "Be able to walk safely" PT Goal Formulation: With patient Time For Goal Achievement: 11/06/20 Potential to Achieve Goals: Good    Frequency Min 3X/week   Barriers to discharge Decreased caregiver support pt with remote family assistance    Co-evaluation               AM-PAC PT "6 Clicks" Mobility  Outcome Measure Help needed turning from your back to your side while in a flat bed without using bedrails?: A Little Help needed moving from lying on your back to sitting on the side of a flat bed without using bedrails?: A Little Help needed moving to and from a bed to a chair (including a wheelchair)?: A Little Help needed standing up from a chair using your arms (e.g., wheelchair or bedside chair)?: A Little Help needed to walk in hospital room?: A Little Help needed climbing 3-5 steps with a railing? : A Lot 6 Click Score: 17    End of Session Equipment Utilized During Treatment: Gait belt Activity Tolerance: Patient limited by fatigue Patient left: in bed;with call bell/phone within reach;with bed alarm set Nurse Communication: Mobility status PT Visit Diagnosis: Unsteadiness on feet (R26.81);Muscle weakness  (generalized) (M62.81);Difficulty in walking, not elsewhere classified (R26.2)    Time: 5537-4827 PT Time Calculation (min) (ACUTE ONLY): 25 min   Charges:   PT Evaluation $PT Eval Low Complexity: 1 Low PT Treatments $Gait Training: 8-22 mins       11:05 AM, 10/30/20 M. Sherlyn Lees, PT, DPT Physical Therapist- [place] Office Number: [phone]

## 2020-10-30 NOTE — Plan of Care (Signed)
  Problem: Acute Rehab PT Goals(only PT should resolve) Goal: Pt Will Go Supine/Side To Sit Outcome: Progressing Flowsheets (Taken 10/30/2020 1106) Pt will go Supine/Side to Sit: with modified independence Goal: Pt Will Go Sit To Supine/Side Outcome: Progressing Flowsheets (Taken 10/30/2020 1106) Pt will go Sit to Supine/Side: with modified independence Goal: Patient Will Transfer Sit To/From Stand Outcome: Progressing Flowsheets (Taken 10/30/2020 1106) Patient will transfer sit to/from stand: with supervision Goal: Pt Will Transfer Bed To Chair/Chair To Bed Outcome: Progressing Flowsheets (Taken 10/30/2020 1106) Pt will Transfer Bed to Chair/Chair to Bed: with supervision Goal: Pt Will Ambulate Outcome: Progressing Flowsheets (Taken 10/30/2020 1106) Pt will Ambulate:  50 feet  with min guard assist  with rolling walker Goal: Pt Will Go Up/Down Stairs Outcome: Progressing Flowsheets (Taken 10/30/2020 1106) Pt will Go Up / Down Stairs:  1-2 stairs  with min guard assist Note: For home re-entry  11:07 AM, 10/30/20 M. Sherlyn Lees, PT, DPT Physical Therapist- [place] Office Number: [phone]
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
## 2020-10-31 DIAGNOSIS — R627 Adult failure to thrive: Secondary | ICD-10-CM

## 2020-10-31 DIAGNOSIS — M1909 Primary osteoarthritis, other specified site: Secondary | ICD-10-CM

## 2020-10-31 DIAGNOSIS — N1832 Chronic kidney disease, stage 3b: Secondary | ICD-10-CM

## 2020-10-31 DIAGNOSIS — E876 Hypokalemia: Secondary | ICD-10-CM

## 2020-10-31 DIAGNOSIS — R197 Diarrhea, unspecified: Secondary | ICD-10-CM

## 2020-10-31 DIAGNOSIS — E860 Dehydration: Secondary | ICD-10-CM

## 2020-10-31 DIAGNOSIS — N179 Acute kidney failure, unspecified: Secondary | ICD-10-CM

## 2020-10-31 LAB — CBC
HCT: 33.5 % — ABNORMAL LOW (ref 36.0–46.0)
Hemoglobin: 11.2 g/dL — ABNORMAL LOW (ref 12.0–15.0)
MCH: 29.6 pg (ref 26.0–34.0)
MCHC: 33.4 g/dL (ref 30.0–36.0)
MCV: 88.4 fL (ref 80.0–100.0)
Platelets: 236 10*3/uL (ref 150–400)
RBC: 3.79 MIL/uL — ABNORMAL LOW (ref 3.87–5.11)
RDW: 14.1 % (ref 11.5–15.5)
WBC: 6.5 10*3/uL (ref 4.0–10.5)
nRBC: 0 % (ref 0.0–0.2)

## 2020-10-31 LAB — BASIC METABOLIC PANEL
Anion gap: 9 (ref 5–15)
BUN: 47 mg/dL — ABNORMAL HIGH (ref 8–23)
CO2: 22 mmol/L (ref 22–32)
Calcium: 9.4 mg/dL (ref 8.9–10.3)
Chloride: 108 mmol/L (ref 98–111)
Creatinine, Ser: 2.49 mg/dL — ABNORMAL HIGH (ref 0.44–1.00)
GFR, Estimated: 19 mL/min — ABNORMAL LOW (ref >60)
Glucose, Bld: 101 mg/dL — ABNORMAL HIGH (ref 70–99)
Potassium: 3.8 mmol/L (ref 3.5–5.1)
Sodium: 139 mmol/L (ref 135–145)

## 2020-10-31 MED ORDER — GABAPENTIN 100 MG PO CAPS
100.0000 mg | ORAL_CAPSULE | Freq: Two times a day (BID) | ORAL | Status: DC
Start: 2020-10-31 — End: 2021-06-14

## 2020-10-31 MED ORDER — MIRTAZAPINE 7.5 MG PO TABS: 7.5000 mg | ORAL_TABLET | Freq: Every day | ORAL | Status: DC

## 2020-10-31 MED ORDER — HYDROCODONE-ACETAMINOPHEN 7.5-325 MG PO TABS: 1.0000 | ORAL_TABLET | Freq: Four times a day (QID) | ORAL | 0 refills | Status: DC | PRN

## 2020-10-31 MED ORDER — MELATONIN 3 MG PO TABS: 3.0000 mg | ORAL_TABLET | Freq: Every day | ORAL | Status: DC

## 2020-10-31 MED ORDER — FAMOTIDINE 20 MG PO TABS: 20.0000 mg | ORAL_TABLET | Freq: Every day | ORAL | Status: DC

## 2020-10-31 MED ORDER — COVID-19 MRNA VAC-TRIS(PFIZER) 30 MCG/0.3ML IM SUSP
0.3000 mL | Freq: Once | INTRAMUSCULAR | Status: DC
  Filled 2020-10-31: qty 0.3

## 2020-10-31 NOTE — TOC Progression Note (Signed)
Transition of Care [facility]) - Progression Note    Patient Details  Name: TISHIA MAESTRE MRN: [id number] Date of Birth: Sep 07, 1935  [facility] CM/SW Contact  Natasha Bence, LCSW Phone Number: 10/31/2020, 5:40 PM  Clinical Narrative:    Tonya with [facility] made Bed offer. Family agreeable. Kenney Houseman reported that patient will need booster shot before coming. Family agreeable to booster. MD contacted pharmacy for booster. Kenney Houseman reported that BO will also need to approve for patient but discharge to facility can be on Monday. TOC to follow.        Expected Discharge Plan and Services           Expected Discharge Date: 10/31/20                                     Social Determinants of Health (SDOH) Interventions    Readmission Risk Interventions No flowsheet data found.

## 2020-10-31 NOTE — Discharge Summary (Addendum)
Physician Discharge Summary  [name] CVE:[id number] DOB: Mar 03, 1936 DOA: 10/27/2020  PCP: Lindell Spar, MD  Admit date: 10/27/2020 Discharge date: 11/02/2020  Time spent: 35 minutes  Recommendations for Outpatient Follow-up:  Repeat basic metabolic panel every Thursday for 3 weeks  Continue encouraging patient to improve nutrition, hydration and physical rehabilitation Reassess blood pressure and reinitiate antihypertensive agents as needed   Discharge Diagnoses:  Acute on chronic renal failure Failure to thrive Hypertension Hypokalemia Diarrhea GERD Hypertension COPD Coronary artery disease Dyslipidemia Hypothyroidism History of osteoarthritis/chronic pain.    Discharge Condition: Stable and improved.  Discharge to skilled nursing facility for long-term care placement, rehabilitation and further care.  Palliative care to follow patient at the facility.  CODE STATUS: DNR  Diet recommendation: Heart healthy diet.  Filed Weights   10/28/20 1455  Weight: 52.9 kg    History of present illness:  [name] is a 85 y.o. female with medical history significant for COPD, CAD, hypertension, dyslipidemia, CKD stage IV, fibromyalgia, and osteoarthritis who was brought to the ED due to not eating or drinking anything for the last 1 week and now has increased weakness.  She has had no abdominal pain, nausea, vomiting, diarrhea, fevers, chills, or cough.  She has had prior COVID vaccinations.  Son at bedside states that she is getting set up to see a nephrologist outpatient for advanced chronic kidney disease.  Patient has been taking her home medications routinely to include her diuretics.   ED Course: Vital signs stable and patient is afebrile.  Hemoglobin is 11.8, sodium 133, creatinine 8.09 and BUN 176.  Potassium is 3.6.  Patient has received 2 L fluid bolus in the ED.  Hospital Course:   AKI on CKD stage IV-suspect prerenal; improving -Patient appears to be on  chlorthalidone as well as potentially Lasix and now has had poor oral intake -Baseline creatinine around 3-4 and on admission creatinine up to 8.9 -after fluids and minimizing nephrotoxic agents Cr down to 3.49 -patient continue to have poor oral intake; but she is making the effort to improve nutrition and hydration. -Patient has declined hemodialysis, but at the same time revoke hospice.  She would like to pursue rehabilitation and return home. --Repeat basic metabolic panel every Thursday for 3 weeks    Failure to thrive -Continues to have poor p.o. intake -Continue to encourage increase oral intake -Low dose Remeron will be added to her regimen for appetite stimulation -Plan is to pursued rehabilitation and long-term care facility with intention to avoid the hospitalizations and transition to comfort care if patient fails to improve. -Palliative care to follow as an outpatient.   Mild hypokalemia -Most likely in the setting of GI losses/diarrhea -Electrolytes were repleted. -Renal function is stable and essentially back to her baseline at time of discharge -Normal potassium. -Repeat basic metabolic panel every Thursday for 3 weeks    Diarrhea -resolved -GI panel negative -Negative C. Diff -Continue symptomatic management.   Elevated serum free light chains -Likely related to CKD -No further hematology follow-up recommended -Metastatic survey negative for lytic lesions -Main goal was initially comfort care; patient has now revoked hospice and would like to proceed rehabilitation. -Will recommend outpatient follow-up based on her progress and further clinical response.   History of CAD/dyslipidemia -Patient with intermittent episodes of bradycardia; beta-blocker were discontinued. -No chest pain or shortness of breath.   History of hypertension -Currently has no require antihypertensive agents. -Continue close monitoring of patient's blood pressure/vital signs with  initiation/resumption of antihypertensive agents if needed. -Watch sodium intake and maintain adequate hydration.   History of COPD -No acute bronchospasms noted -Continue breathing treatments as needed   History of hypothyroidism -TSH 1.466 -Continue Synthroid   History of fibromyalgia -Will resume Cymbalta    History of osteoarthritis -Continue as needed analgesic therapy. -Chronic and overall unchanged.   GERD -Continue famotidine and as needed Maalox.  Procedures: See below for x-ray reports.  Consultations: Palliative care Hospice  Discharge Exam: Vitals:   10/31/20 0602 10/31/20 1440  BP: 138/80 136/83  Pulse: 70 86  Resp: 18   Temp: 98.9 F (37.2 C) 98 F (36.7 C)  SpO2: 97% 98%   General exam: Alert, more lucid and following commands appropriately.  Still no eating much, but making efforts.  Patient denies chest pain, nausea, vomiting and is afebrile. Respiratory system: Clear to auscultation. Respiratory effort normal.  No using accessory muscle.  Good saturation on room air. Cardiovascular system:RRR. No rubs or gallops.  No JVD. Gastrointestinal system: Abdomen is nondistended, soft and nontender. No organomegaly or masses felt. Normal bowel sounds heard. Central nervous system: No focal neurological deficits. Extremities: No cyanosis or clubbing. Skin: No rashes, lesions or ulcers Psychiatry:Mood & affect appropriate.    Discharge Instructions    Allergies as of 10/31/2020       Reactions   Hydralazine Other (See Comments)   Breathing   Nalbuphine Rash   Paroxetine Itching   Sulfa Antibiotics Other (See Comments)   Childhood reaction.   Venlafaxine Itching        Medication List     STOP taking these medications    amLODipine 5 MG tablet Commonly known as: NORVASC   carvedilol 6.25 MG tablet Commonly known as: COREG   chlorthalidone 25 MG tablet Commonly known as: HYGROTON   furosemide 40 MG tablet Commonly known as: LASIX    hydrochlorothiazide 25 MG tablet Commonly known as: HYDRODIURIL   lisinopril 10 MG tablet Commonly known as: ZESTRIL   permethrin 5 % cream Commonly known as: ELIMITE   potassium chloride SA 20 MEQ tablet Commonly known as: KLOR-CON   sucralfate 1 g tablet Commonly known as: Carafate       TAKE these medications    albuterol 108 (90 Base) MCG/ACT inhaler Commonly known as: VENTOLIN HFA Inhale 1-2 puffs into the lungs every 6 (six) hours as needed for wheezing or shortness of breath.   atorvastatin 20 MG tablet Commonly known as: LIPITOR Take 20 mg every evening by mouth.   DULoxetine 30 MG capsule Commonly known as: CYMBALTA Take 1 capsule (30 mg total) by mouth daily. What changed: Another medication with the same name was removed. Continue taking this medication, and follow the directions you see here.   EYE VITAMINS PO Take 1 tablet by mouth daily.   famotidine 20 MG tablet Commonly known as: PEPCID Take 1 tablet (20 mg total) by mouth at bedtime. What changed: when to take this   gabapentin 100 MG capsule Commonly known as: NEURONTIN Take 1 capsule (100 mg total) by mouth 2 (two) times daily. What changed: See the new instructions.   HYDROcodone-acetaminophen 7.5-325 MG tablet Commonly known as: NORCO Take 1 tablet by mouth every 6 (six) hours as needed for severe pain. What changed:  when to take this reasons to take this Another medication with the same name was removed. Continue taking this medication, and follow the directions you see here.   levothyroxine 88 MCG tablet Commonly known as:  SYNTHROID Take 1 tablet (88 mcg total) by mouth daily before breakfast.   Linzess 145 MCG Caps capsule Generic drug: linaclotide TAKE 1 CAPSULE ONCE DAILY BEFORE BREAKFAST. What changed: See the new instructions.   melatonin 3 MG Tabs tablet Take 1 tablet (3 mg total) by mouth at bedtime.   mirtazapine 7.5 MG tablet Commonly known as: REMERON Take 1  tablet (7.5 mg total) by mouth at bedtime.   ondansetron 4 MG tablet Commonly known as: ZOFRAN TAKE 1 TABLET BY MOUTH THREE TIMES AS NEEDED FOR NAUSEA. What changed: See the new instructions.   pantoprazole 40 MG tablet Commonly known as: PROTONIX TAKE (1) TABLET BY MOUTH TWICE DAILY. What changed: See the new instructions.       Allergies  Allergen Reactions   Hydralazine Other (See Comments)    Breathing   Nalbuphine Rash   Paroxetine Itching   Sulfa Antibiotics Other (See Comments)    Childhood reaction.   Venlafaxine Itching    The results of significant diagnostics from this hospitalization (including imaging, microbiology, ancillary and laboratory) are listed below for reference.    Significant Diagnostic Studies: No results found.  Microbiology: Recent Results (from the past 240 hour(s))  SARS CORONAVIRUS 2 (TAT 6-24 HRS) Nasopharyngeal Nasopharyngeal Swab     Status: None   Collection Time: 10/24/20  1:50 PM   Specimen: Nasopharyngeal Swab  Result Value Ref Range Status   SARS Coronavirus 2 NEGATIVE NEGATIVE Final    Comment: (NOTE) SARS-CoV-2 target nucleic acids are NOT DETECTED.  The SARS-CoV-2 RNA is generally detectable in upper and lower respiratory specimens during the acute phase of infection. Negative results do not preclude SARS-CoV-2 infection, do not rule out co-infections with other pathogens, and should not be used as the sole basis for treatment or other patient management decisions. Negative results must be combined with clinical observations, patient history, and epidemiological information. The expected result is Negative.  Fact Sheet for Patients: SugarRoll.be  Fact Sheet for Healthcare Providers: https://www.woods-mathews.com/  This test is not yet approved or cleared by the [place] FDA and  has been authorized for detection and/or diagnosis of SARS-CoV-2 by FDA under an Emergency Use  Authorization (EUA). This EUA will remain  in effect (meaning this test can be used) for the duration of the COVID-19 declaration under Se ction 564(b)(1) of the Act, 21 U.S.C. section 360bbb-3(b)(1), unless the authorization is terminated or revoked sooner.  Performed at [facility], [place] [address] [place], [place] [address]   Gastrointestinal Panel by PCR , Stool     Status: None   Collection Time: 10/24/20  6:05 PM   Specimen: Stool  Result Value Ref Range Status   Campylobacter species NOT DETECTED NOT DETECTED Final   Plesimonas shigelloides NOT DETECTED NOT DETECTED Final   Salmonella species NOT DETECTED NOT DETECTED Final   Yersinia enterocolitica NOT DETECTED NOT DETECTED Final   Vibrio species NOT DETECTED NOT DETECTED Final   Vibrio cholerae NOT DETECTED NOT DETECTED Final   Enteroaggregative E coli (EAEC) NOT DETECTED NOT DETECTED Final   Enteropathogenic E coli (EPEC) NOT DETECTED NOT DETECTED Final   Enterotoxigenic E coli (ETEC) NOT DETECTED NOT DETECTED Final   Shiga like toxin producing E coli (STEC) NOT DETECTED NOT DETECTED Final   Shigella/Enteroinvasive E coli (EIEC) NOT DETECTED NOT DETECTED Final   Cryptosporidium NOT DETECTED NOT DETECTED Final   Cyclospora cayetanensis NOT DETECTED NOT DETECTED Final   Entamoeba histolytica NOT DETECTED NOT DETECTED Final  Giardia lamblia NOT DETECTED NOT DETECTED Final   Adenovirus F40/41 NOT DETECTED NOT DETECTED Final   Astrovirus NOT DETECTED NOT DETECTED Final   Norovirus GI/GII NOT DETECTED NOT DETECTED Final   Rotavirus A NOT DETECTED NOT DETECTED Final   Sapovirus (I, II, IV, and V) NOT DETECTED NOT DETECTED Final    Comment: Performed at [facility], [address] [place], [place] [address]     Labs: Basic Metabolic Panel: Recent Labs  Lab 10/25/20 0534 10/26/20 0442 10/27/20 0402 10/31/20 0625  NA 140 139 142 139  K 3.3* 3.0* 3.8 3.8  CL 105 109 113* 108  CO2 18* 18* 18* 22   GLUCOSE 94 91 96 101*  BUN 147* 117* 93* 47*  CREATININE 6.10* 4.55* 3.49* 2.49*  CALCIUM 9.2 8.9 9.5 9.4  MG 1.9 1.9 1.8  --    Liver Function Tests: Recent Labs  Lab 10/25/20 0534  AST 23  ALT 11  ALKPHOS 46  BILITOT 1.0  PROT 6.3*  ALBUMIN 3.3*   CBC: Recent Labs  Lab 10/25/20 0534 10/31/20 0625  WBC 6.0 6.5  HGB 11.4* 11.2*  HCT 34.4* 33.5*  MCV 88.2 88.4  PLT 199 236    CBG: Recent Labs  Lab 10/29/20 1623  GLUCAP 102*    Signed:  Barton Dubois MD.  [facility] 10/31/2020, 3:07 PM

## 2020-11-01 DIAGNOSIS — N179 Acute kidney failure, unspecified: Secondary | ICD-10-CM

## 2020-11-01 DIAGNOSIS — R627 Adult failure to thrive: Secondary | ICD-10-CM

## 2020-11-01 DIAGNOSIS — N1832 Chronic kidney disease, stage 3b: Secondary | ICD-10-CM

## 2020-11-01 LAB — RESP PANEL BY RT-PCR (FLU A&B, COVID) ARPGX2
Influenza A by PCR: NEGATIVE
Influenza B by PCR: NEGATIVE
SARS Coronavirus 2 by RT PCR: NEGATIVE

## 2020-11-01 MED ORDER — COVID-19 MRNA VACC (MODERNA) 50 MCG/0.25ML IM SUSP
0.2500 mL | Freq: Once | INTRAMUSCULAR | Status: AC
  Administered 2020-11-01: 0.25 mL via INTRAMUSCULAR
  Filled 2020-11-01: qty 0.25

## 2020-11-01 NOTE — Progress Notes (Signed)
Patient received Covid Moderna booster today in left deltoid noted no reactions at this time.

## 2020-11-01 NOTE — TOC Progression Note (Signed)
Transition of Care [facility]) - Progression Note    Patient Details  Name: [name] MRN: [id number] Date of Birth: [birth year]  Transition of Care [facility]) CM/SW Contact  Natasha Bence, LCSW Phone Number: 11/01/2020, 4:40 PM  Clinical Narrative:    [facility] with [facility] reported that their was a Covid outbreak at facility and that no beds were available today. Tanya expressed that bed availability may be open tomorrow for patient. TOC to follow.     Barriers to Discharge: Barriers Resolved  Expected Discharge Plan and Services           Expected Discharge Date: 11/01/20                                     Social Determinants of Health (SDOH) Interventions    Readmission Risk Interventions No flowsheet data found.

## 2020-11-01 NOTE — Progress Notes (Signed)
Patient seen and examined. No overnight events reported. Still with decrease oral intake. Alert, awake and following commands appropriately. Patient is hemodynamically stable for discharge to SNF. TOC to facilitate discharge. Please refer to discharge summary written on  10/31/20 for further info and details; it has been reviewed and ot changes needed.  Barton Dubois MD [phone]

## 2020-11-02 NOTE — Plan of Care (Signed)
  Problem: Education: Goal: Knowledge of General Education information will improve Description: Including pain rating scale, medication(s)/side effects and non-pharmacologic comfort measures Outcome: Progressing   Problem: Activity: Goal: Risk for activity intolerance will decrease Outcome: Progressing   

## 2020-11-02 NOTE — Progress Notes (Signed)
Chart reviewed. Patient is hemodynamically and medically stable for discharge. Unable to be transferred as planned on 11/01/20 due to bed situation at receiving facility, per [facility] documentation, expected to have barriers resolved for transfer today. Please refer to discharge summary written on 10/31/20 for further info/details and follow up instructions, it has been reviewed and no changes needed. Patient is in not acute distress, COVID PCR negative and fully vaccinated/boosted with Moderna at this point.   Barton Dubois MD [phone]

## 2020-11-02 NOTE — TOC Transition Note (Signed)
Transition of Care [facility]) - CM/SW Discharge Note   Patient Details  Name: [name] MRN: [id number] Date of Birth: August 27, 1935  [facility] of Care [facility]) CM/SW Contact:  Natasha Bence, LCSW Phone Number: 11/02/2020, 12:23 PM   Clinical Narrative:    CSW contacted Tanya with [facility]. [facility] reported that she is able to take the patient today. CSW also referred patient to [facility] OP palliative. CSW completed med necessity and called EMS. Nurse to call report. TOC to follow.   Final next level of care: Long Term Nursing Home Barriers to Discharge: Barriers Resolved   Patient Goals and CMS Choice Patient states their goals for this hospitalization and ongoing recovery are:: LTC CMS Medicare.gov Compare Post Acute Care list provided to:: Patient Choice offered to / list presented to : Patient  Discharge Placement                Patient to be transferred to facility by: [facility] Name of family member notified: Elspeth Cho (Son)   [phone] Patient and family notified of of transfer: 11/01/20  Discharge Plan and Services                                     Social Determinants of Health (SDOH) Interventions     Readmission Risk Interventions No flowsheet data found.

## 2020-11-03 NOTE — Progress Notes (Signed)
Patient seen and evaluated, chart reviewed, please see EMR for updated orders. Please see full dictated discharge summary dictated by Dr Dyann Kief on 10/31/20 and addended by me on 11/03/20---- --- Repeat basic metabolic panel every Thursday for 3 weeks  - Remeron for appetite stimulation--- -- Outpatient Palliative care at SNF/Long Term Facility--  Plan of care Discussed with her son at bedside  Roxan Hockey, MD

## 2020-11-03 NOTE — Plan of Care (Signed)

## 2020-11-04 ENCOUNTER — Ambulatory Visit: Payer: Medicare Other | Admitting: Internal Medicine

## 2020-11-06 ENCOUNTER — Other Ambulatory Visit: Payer: Self-pay | Admitting: Internal Medicine

## 2020-11-09 DIAGNOSIS — D649 Anemia, unspecified: Secondary | ICD-10-CM | POA: Diagnosis not present

## 2020-11-12 ENCOUNTER — Encounter: Payer: Self-pay | Admitting: Nurse Practitioner

## 2020-11-12 ENCOUNTER — Non-Acute Institutional Stay: Payer: Medicare Other | Admitting: Nurse Practitioner

## 2020-11-12 VITALS — BP 110/68 | HR 84 | Temp 96.2°F | Resp 18 | Wt 116.6 lb

## 2020-11-12 DIAGNOSIS — Z515 Encounter for palliative care: Secondary | ICD-10-CM

## 2020-11-12 DIAGNOSIS — J449 Chronic obstructive pulmonary disease, unspecified: Secondary | ICD-10-CM | POA: Diagnosis not present

## 2020-11-12 DIAGNOSIS — R5381 Other malaise: Secondary | ICD-10-CM | POA: Diagnosis not present

## 2020-11-12 DIAGNOSIS — R0602 Shortness of breath: Secondary | ICD-10-CM
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
## 2020-11-13 DIAGNOSIS — U071 COVID-19: Secondary | ICD-10-CM | POA: Diagnosis not present

## 2020-11-13 DIAGNOSIS — I2510 Atherosclerotic heart disease of native coronary artery without angina pectoris: Secondary | ICD-10-CM | POA: Diagnosis not present

## 2020-11-15 ENCOUNTER — Other Ambulatory Visit: Payer: Self-pay

## 2020-11-15 DIAGNOSIS — U071 COVID-19: Secondary | ICD-10-CM | POA: Diagnosis not present

## 2020-11-15 DIAGNOSIS — J449 Chronic obstructive pulmonary disease, unspecified: Secondary | ICD-10-CM | POA: Diagnosis not present

## 2020-11-15 DIAGNOSIS — N184 Chronic kidney disease, stage 4 (severe): Secondary | ICD-10-CM | POA: Diagnosis not present

## 2020-11-15 DIAGNOSIS — R7989 Other specified abnormal findings of blood chemistry: Secondary | ICD-10-CM | POA: Diagnosis not present

## 2020-11-15 DIAGNOSIS — M6281 Muscle weakness (generalized): Secondary | ICD-10-CM | POA: Diagnosis not present

## 2020-11-15 DIAGNOSIS — R627 Adult failure to thrive: Secondary | ICD-10-CM | POA: Diagnosis not present

## 2020-11-17 DIAGNOSIS — I2510 Atherosclerotic heart disease of native coronary artery without angina pectoris: Secondary | ICD-10-CM | POA: Diagnosis not present

## 2020-11-18 ENCOUNTER — Telehealth: Payer: Self-pay | Admitting: Internal Medicine

## 2020-11-18 NOTE — Telephone Encounter (Signed)
Patient son Richardson Landry wants a list of her medication she is currently taking   Please call him back at [phone]

## 2020-11-19 NOTE — Telephone Encounter (Signed)
Medication list printed, pt son informed it is ready for pick up.

## 2020-11-22 ENCOUNTER — Other Ambulatory Visit: Payer: Self-pay | Admitting: Internal Medicine

## 2020-11-26 ENCOUNTER — Other Ambulatory Visit: Payer: Self-pay

## 2020-11-26 ENCOUNTER — Encounter: Payer: Self-pay | Admitting: Family Medicine

## 2020-11-26 ENCOUNTER — Ambulatory Visit (INDEPENDENT_AMBULATORY_CARE_PROVIDER_SITE_OTHER): Payer: Medicare Other | Admitting: Family Medicine

## 2020-11-26 VITALS — BP 125/76 | HR 59

## 2020-11-26 DIAGNOSIS — N3001 Acute cystitis with hematuria: Secondary | ICD-10-CM

## 2020-11-26 LAB — POCT URINALYSIS DIP (CLINITEK)
Bilirubin, UA: NEGATIVE
Glucose, UA: NEGATIVE mg/dL
Ketones, POC UA: NEGATIVE mg/dL
Nitrite, UA: NEGATIVE
POC PROTEIN,UA: 30 — AB
Spec Grav, UA: 1.02 (ref 1.010–1.025)
Urobilinogen, UA: 0.2 E.U./dL
pH, UA: 5.5 (ref 5.0–8.0)

## 2020-11-26 MED ORDER — AMPICILLIN 500 MG PO CAPS: 500.0000 mg | ORAL_CAPSULE | Freq: Three times a day (TID) | ORAL | 0 refills | Status: DC

## 2020-11-26 NOTE — Progress Notes (Signed)
U as before, call if you need [place] sooner. ypuaVirtual Visit via Telephone Note  I connected with [name] on 11/26/20 at  1:00 PM EDT by telephone and verified that I am speaking with the correct person using two identifiers.  Location: Patient: home Provider: office   I discussed the limitations, risks, security and privacy concerns of performing an evaluation and management service by telephone and the availability of in person appointments. I also discussed with the patient that there may be a patient responsible charge related to this service. The patient expressed understanding and agreed to proceed.   History of Present Illness: 5 day h/o dysuria and frequency, denies fever, chills, nausea or visible blood in the urine, has had UTI in the past nd her current symptoms are similar. Urine specimen brought in on sterile container by family member Denies appetite change or excessive fatigue   Observations/Objective: BP 125/76   Pulse (!) 28 'Good communication with no confusion and intact memory. Alert and oriented x 3 No signs of respiratory distress during speech   Assessment and Plan: Acute cystitis with hematuria Symptomatic with abnormal urinalysis, treat presumptively with ampicillin. If remains symptomatic or worsens , she is to call in for re eval with sterile specimen to be submitted or c/s encouraged increased water intake and frequent voiding   Follow Up Instructions:    I discussed the assessment and treatment plan with the patient. The patient was provided an opportunity to ask questions and all were answered. The patient agreed with the plan and demonstrated an understanding of the instructions.   The patient was advised to call back or seek an in-person evaluation if the symptoms worsen or if the condition fails to improve as anticipated.  I provided 12 minutes of non-face-to-face time during this encounter.   Tula Nakayama, MD

## 2020-11-26 NOTE — Patient Instructions (Signed)
F/U as before, call if you need [place] sooner  You are treated for a bladder infection and a 5 day course of ampicillin is prescribed  Please ensure you drink water often , aim for 60 ounces/ day, and empty your bladder often  Thanks for choosing [facility], we consider it a privelige to serve you.

## 2020-11-28 ENCOUNTER — Encounter: Payer: Self-pay | Admitting: Family Medicine

## 2020-11-28 DIAGNOSIS — N3001 Acute cystitis with hematuria: Secondary | ICD-10-CM | POA: Insufficient documentation

## 2020-11-28 DIAGNOSIS — N3000 Acute cystitis without hematuria: Secondary | ICD-10-CM | POA: Insufficient documentation

## 2020-11-28 NOTE — Assessment & Plan Note (Signed)
Symptomatic with abnormal urinalysis, treat presumptively with ampicillin. If remains symptomatic or worsens , she is to call in for re eval with sterile specimen to be submitted or c/s encouraged increased water intake and frequent voiding

## 2020-12-08 ENCOUNTER — Ambulatory Visit (INDEPENDENT_AMBULATORY_CARE_PROVIDER_SITE_OTHER): Payer: Medicare Other | Admitting: Family Medicine

## 2020-12-08 ENCOUNTER — Other Ambulatory Visit: Payer: Self-pay

## 2020-12-08 DIAGNOSIS — N3000 Acute cystitis without hematuria: Secondary | ICD-10-CM

## 2020-12-08 LAB — POCT URINALYSIS DIP (CLINITEK)
Bilirubin, UA: NEGATIVE
Blood, UA: NEGATIVE
Glucose, UA: NEGATIVE mg/dL
Ketones, POC UA: NEGATIVE mg/dL
Nitrite, UA: NEGATIVE
POC PROTEIN,UA: 100 — AB
Spec Grav, UA: 1.02 (ref 1.010–1.025)
Urobilinogen, UA: 0.2 E.U./dL
pH, UA: 7 (ref 5.0–8.0)

## 2020-12-08 NOTE — Progress Notes (Signed)
Unable to contact patient , after multiple attempts, appointment cancelled and rescheduled

## 2020-12-09 ENCOUNTER — Ambulatory Visit (INDEPENDENT_AMBULATORY_CARE_PROVIDER_SITE_OTHER): Payer: Medicare Other | Admitting: Family Medicine

## 2020-12-09 VITALS — BP 136/76

## 2020-12-09 DIAGNOSIS — N3090 Cystitis, unspecified without hematuria: Secondary | ICD-10-CM

## 2020-12-09 NOTE — Progress Notes (Signed)
Virtual Visit via Telephone Note  I connected with [name] on 12/09/20 at 10:20 AM EDT by telephone and verified that I am speaking with the correct person using two identifiers.  Location: Patient: home Provider: office   I discussed the limitations, risks, security and privacy concerns of performing an evaluation and management service by telephone and the availability of in person appointments. I also discussed with the patient that there may be a patient responsible charge related to this service. The patient expressed understanding and agreed to proceed.   History of Present Illness:   3 day h/o pelvic pressure and discomfort with urination, as soon as a 5 day course of ampicillin was completed. Has h/o UTI, no specimen sent for culture recently as specimen submitted was contaminated, not provided in sterile container. Denies fever, chills, appetite change, nausea or vomiting Observations/Objective: BP 136/76  Good communication with no confusion and intact memory. Alert and oriented x 3 No signs of respiratory distress during speech   Assessment and Plan: Cystitis Mildly symptomatic following 5 day antibiotic course , wait on c/s prior to any additional antibiotics, encouraged increase water intake with frequent voiding   Follow Up Instructions:    I discussed the assessment and treatment plan with the patient. The patient was provided an opportunity to ask questions and all were answered. The patient agreed with the plan and demonstrated an understanding of the instructions.   The patient was advised to call back or seek an in-person evaluation if the symptoms worsen or if the condition fails to improve as anticipated.  I provided 8 minutes of non-face-to-face time during this encounter.   Tula Nakayama, MD

## 2020-12-09 NOTE — Patient Instructions (Signed)
F/U with Dr Posey Pronto sooner than mid October if possible, patient has multiple medical concerns she wants to discuss/ have addressed  No antibiotic is prescribed today, if your urine test comes back showing that you have an infection which needs to be treated we will let you  know  Thanks for choosing [facility], we consider it a privelige to serve you.

## 2020-12-11 ENCOUNTER — Encounter: Payer: Self-pay | Admitting: Family Medicine

## 2020-12-11 DIAGNOSIS — N3090 Cystitis, unspecified without hematuria: Secondary | ICD-10-CM | POA: Insufficient documentation

## 2020-12-11 NOTE — Assessment & Plan Note (Signed)
Mildly symptomatic following 5 day antibiotic course , wait on c/s prior to any additional antibiotics, encouraged increase water intake with frequent voiding

## 2020-12-12 LAB — URINE CULTURE

## 2020-12-14 ENCOUNTER — Telehealth: Payer: Self-pay

## 2020-12-14 NOTE — Telephone Encounter (Signed)
Pt son informed. 

## 2020-12-14 NOTE — Telephone Encounter (Signed)
Patient son [name] called asking about lab results on uti call back # [phone]

## 2020-12-17 ENCOUNTER — Telehealth: Payer: Self-pay

## 2020-12-17 ENCOUNTER — Other Ambulatory Visit: Payer: Self-pay | Admitting: Student

## 2020-12-17 NOTE — Telephone Encounter (Signed)
Pt son states he does not think that anyone stopped it, but [facility] will not refill without authorization from the PCP?

## 2020-12-17 NOTE — Telephone Encounter (Signed)
Patient son called asking why patient is not to take the blood pressure medicine Carvedilol 6.25 mg anymore.   Pharmacy: [facility] call back # [phone]

## 2020-12-20 NOTE — Telephone Encounter (Signed)
Pt advised to contact nephrology and have them prescribe it if they would like to continue with verbal understanding

## 2020-12-22 ENCOUNTER — Ambulatory Visit: Payer: Medicare Other | Admitting: Student

## 2020-12-24 ENCOUNTER — Ambulatory Visit: Payer: Medicare Other | Admitting: Internal Medicine

## 2020-12-28 ENCOUNTER — Encounter: Payer: Self-pay | Admitting: Internal Medicine

## 2020-12-28 ENCOUNTER — Other Ambulatory Visit: Payer: Self-pay

## 2020-12-28 ENCOUNTER — Ambulatory Visit (INDEPENDENT_AMBULATORY_CARE_PROVIDER_SITE_OTHER): Payer: Medicare Other | Admitting: Internal Medicine

## 2020-12-28 ENCOUNTER — Encounter (INDEPENDENT_AMBULATORY_CARE_PROVIDER_SITE_OTHER): Payer: Self-pay

## 2020-12-28 VITALS — BP 161/91 | HR 63 | Temp 97.9°F | Resp 18 | Ht 60.0 in

## 2020-12-28 DIAGNOSIS — R5381 Other malaise: Secondary | ICD-10-CM | POA: Insufficient documentation

## 2020-12-28 DIAGNOSIS — J439 Emphysema, unspecified: Secondary | ICD-10-CM | POA: Diagnosis not present

## 2020-12-28 DIAGNOSIS — N184 Chronic kidney disease, stage 4 (severe): Secondary | ICD-10-CM

## 2020-12-28 DIAGNOSIS — I10 Essential (primary) hypertension: Secondary | ICD-10-CM | POA: Diagnosis not present

## 2020-12-28 DIAGNOSIS — J449 Chronic obstructive pulmonary disease, unspecified: Secondary | ICD-10-CM | POA: Diagnosis not present

## 2020-12-28 DIAGNOSIS — E039 Hypothyroidism, unspecified: Secondary | ICD-10-CM | POA: Diagnosis not present

## 2020-12-28 DIAGNOSIS — K219 Gastro-esophageal reflux disease without esophagitis: Secondary | ICD-10-CM

## 2020-12-28 DIAGNOSIS — Z23 Encounter for immunization: Secondary | ICD-10-CM

## 2020-12-28 DIAGNOSIS — M170 Bilateral primary osteoarthritis of knee: Secondary | ICD-10-CM | POA: Diagnosis not present

## 2020-12-28 MED ORDER — PANTOPRAZOLE SODIUM 40 MG PO TBEC: 40.0000 mg | DELAYED_RELEASE_TABLET | Freq: Every day | ORAL | 1 refills | Status: DC

## 2020-12-28 MED ORDER — UNABLE TO FIND: 0 refills | Status: DC

## 2020-12-28 MED ORDER — ALBUTEROL SULFATE HFA 108 (90 BASE) MCG/ACT IN AERS: 1.0000 | INHALATION_SPRAY | Freq: Four times a day (QID) | RESPIRATORY_TRACT | 5 refills | Status: AC | PRN

## 2020-12-28 NOTE — Assessment & Plan Note (Signed)
On Pantoprazole and Carafate - now better Has been taking Pantoprazole BID, advised to take it QD now F/u with GI

## 2020-12-28 NOTE — Assessment & Plan Note (Signed)
Follows up with nephrologist, needs a follow up visit Follows up with hematooncologist for evaluation of elevated light chain levels Advised to follow renal diet as advised by nephrologist and dietitian Avoid nephrotoxic agents including NSAIDs Needs to clarify medications - was on HCTZ and Lasix in the past, on no diuretics currently, had hospitalization for AKI on CKD as well

## 2020-12-28 NOTE — Patient Instructions (Signed)
You are being scheduled for US thyroid.  Please start taking Lisinopril and Levothyroxine regularly.  Continue to follow low salt diet.

## 2020-12-28 NOTE — Assessment & Plan Note (Signed)
BP Readings from Last 1 Encounters:  12/28/20 (!) 161/91   Uncontrolled with Coreg 6.25 mg twice daily and lisinopril 10 mg daily due to noncompliance Counseled for compliance with the medications Advised DASH diet and moderate exercise/walking as tolerated Chronic pain and CKD also contributing to high BP Needs a follow up visit with Nephrologist

## 2020-12-28 NOTE — Progress Notes (Signed)
Established Patient Office Visit  Subjective:  Patient ID: [name], female    DOB: 11/13/1935  Age: 85 y.o. MRN: [id number]  CC:  Chief Complaint  Patient presents with   Follow-up    Follow up needs prescription for small wheelchair and would like blood work to check kidneys and thyroid     HPI [name]  is an 85 year old female with past medical history of uncontrolled hypertension, CKD stage IV, fibromyalgia, chronic pelvic pain, chronic constipation, hypothyroidism, osteoarthritis of knees and depression who presents for follow up of her chronic medical conditions.  HTN: Her BP has been elevated today. Her home BP readings show inconsistent pattern of controlled BP at times. Of note, she admits that she takes 1/2 tablet of Lisinopril at times. She has been taking Coreg. Diuretics had been held since hospitalization for AKI on CKD. She denies any chest pain or palpitations currently. Her leg swelling has improved as well now.  CKD stage IV: She has not had Nephrology follow up recently. Denies any dysuria or hematuria currently.  Hypothyroidism: She has been having throat pain and difficulty swallowing sometimes. She has not been taking Levothyroxine regularly as she thinks it causes her to have crystals in her eyes. Denies any recent change in appetite or weight.  She asks for a new Rx for small wheelchair so she can easily move around in her house.  Past Medical History:  Diagnosis Date   Anxiety disorder, unspecified    Arteriosclerotic cardiovascular disease (ASCVD) 2006   Nonobstructive; < 50% lesions on cath 2002; negative stress nuclear study in 10/2004   Asthma    Chest pain, unspecified    Chronic kidney disease, unspecified    Chronic obstructive pulmonary disease, unspecified ([place])    Chronic pelvic pain in female    Colitis due to Clostridium difficile 1994   1994   COPD (chronic obstructive pulmonary disease) ([place])    Depression with anxiety     Diarrhea, unspecified    Disorder of thyroid, unspecified    Gastro-esophageal reflux disease without esophagitis    Gastroesophageal reflux disease    Hyperlipidemia    Hyperlipidemia, unspecified    Hypertension    Hypertensive chronic kidney disease with stage 1 through stage 4 chronic kidney disease, or unspecified chronic kidney disease    Hyperthyroidism    Hypothyroidism    Hypothyroidism, unspecified    Kidney disease, chronic, stage III (moderate, EGFR 30-59 ml/min) (HCC)    Lower abdominal pain, unspecified    Major depressive disorder, single episode, severe without psychotic features ([place])    Major depressive disorder, single episode, unspecified    Nausea    Other postherpetic nervous system involvement    Pelvic and perineal pain    Rash and other nonspecific skin eruption    S/P colonoscopy 2007   few diverticula, otherwise nl   S/P colonoscopy 12/13/10   rectal, cecal polyp, left-sided diverticulosis, hyperplastic. ?anal fissure? treated empirically   S/P endoscopy 2009   linear esophageal erosions   S/P endoscopy 05/10/10   Question island of salmon-colored epithelium in distal esophagus ; no Barrett's.    Shingles    Tobacco abuse, in remission    55-pack-year consumption; discontinued 08/2010   Unspecified asthma, uncomplicated    Unspecified osteoarthritis, unspecified site    Urinary tract infection, site not specified     Past Surgical History:  Procedure Laterality Date   ABDOMINAL HYSTERECTOMY     BREAST LUMPECTOMY  CATARACT EXTRACTION     CHOLECYSTECTOMY  1962   COLONOSCOPY  12/13/2010   Left-sided diverticulosis.  Cecal polyp, status post hot snare polypectomy/ Diminutive rectal polyp, status post cold biopsy removal tender/painful anal canal, ? occult anal fissure- Not visualized   COLONOSCOPY N/A 10/14/2015   Diverticulosis   ESOPHAGOGASTRODUODENOSCOPY  05/10/10   benign mucosa with mild chronic inflammation.   ESOPHAGOGASTRODUODENOSCOPY (EGD)  WITH PROPOFOL N/A 02/13/2019   Procedure: ESOPHAGOGASTRODUODENOSCOPY (EGD) WITH PROPOFOL;  Surgeon: Daneil Dolin, MD;  [facility]: [facility];  Service: Endoscopy;  Laterality: N/A;  3:00pm   FLEXIBLE SIGMOIDOSCOPY  12/29/2011   Procedure: FLEXIBLE SIGMOIDOSCOPY;  Surgeon: Rogene Houston, MD;  Location: [facility];  Service: Endoscopy;  Laterality: N/A;  200-Ann notified pt to be here @ 3:34   NISSEN FUNDOPLICATION     YAG LASER APPLICATION Left 3/56/8616   Procedure: YAG LASER APPLICATION;  Surgeon: Williams Che, MD;  Location: [facility];  Service: Ophthalmology;  Laterality: Left;    Family History  Problem Relation Age of Onset   Diabetes Mother    Alzheimer's disease Mother    Cancer Mother    Heart disease Father    Cancer Sister    Heart disease Brother        Also mother, Father, brother and 2 sons   Cancer Son        testicular cancer    Aneurysm Son    Diabetes Son    Hypertension Brother        also Sister x2   Colon cancer Neg Hx     Social History   Socioeconomic History   Marital status: Married    Spouse name: Not on file   Number of children: 4   Years of education: Not on file   Highest education level: Not on file  Occupational History   Not on file  Tobacco Use   Smoking status: Former    Packs/day: 1.00    Years: 55.00    Pack years: 55.00    Types: Cigarettes    Quit date: 09/15/2010    Years since quitting: 10.2   Smokeless tobacco: Never  Vaping Use   Vaping Use: Never used  Substance and Sexual Activity   Alcohol use: No    Alcohol/week: 0.0 standard drinks   Drug use: No   Sexual activity: Not Currently  Other Topics Concern   Not on file  Social History Narrative   Not on file   Social Determinants of Health   Financial Resource Strain: Low Risk    Difficulty of Paying Living Expenses: Not hard at all  Food Insecurity: No Food Insecurity   Worried About Charity fundraiser in the Last Year: Never true   [place] in  the Last Year: Never true  Transportation Needs: No Transportation Needs   Lack of Transportation (Medical): No   Lack of Transportation (Non-Medical): No  Physical Activity: Inactive   Days of Exercise per Week: 0 days   Minutes of Exercise per Session: 0 min  Stress: No Stress Concern Present   Feeling of Stress : Not at all  Social Connections: Moderately Isolated   Frequency of Communication with Friends and Family: More than three times a week   Frequency of Social Gatherings with Friends and Family: More than three times a week   Attends Religious Services: Never   Marine scientist or Organizations: No   Attends Archivist Meetings:  Never   Marital Status: Married  Human resources officer Violence: Not At Risk   Fear of Current or Ex-Partner: No   Emotionally Abused: No   Physically Abused: No   Sexually Abused: No    Outpatient Medications Prior to Visit  Medication Sig Dispense Refill   atorvastatin (LIPITOR) 20 MG tablet Take 20 mg every evening by mouth.      DULoxetine (CYMBALTA) 30 MG capsule Take 1 capsule (30 mg total) by mouth daily. 90 capsule 0   HYDROcodone-acetaminophen (NORCO) 7.5-325 MG tablet Take 1 tablet by mouth every 6 (six) hours as needed for severe pain. 30 tablet 0   levothyroxine (SYNTHROID) 88 MCG tablet Take 1 tablet (88 mcg total) by mouth daily before breakfast. 90 tablet 1   Multiple Vitamins-Minerals (EYE VITAMINS PO) Take 1 tablet by mouth daily.      ondansetron (ZOFRAN) 4 MG tablet TAKE 1 TABLET BY MOUTH THREE TIMES AS NEEDED FOR NAUSEA. 90 tablet 0   pantoprazole (PROTONIX) 40 MG tablet TAKE (1) TABLET BY MOUTH TWICE DAILY. 60 tablet 0   carvedilol (COREG) 6.25 MG tablet Take 6.25 mg by mouth 2 (two) times daily.     gabapentin (NEURONTIN) 100 MG capsule Take 1 capsule (100 mg total) by mouth 2 (two) times daily. (Patient not taking: Reported on 12/28/2020)     albuterol (VENTOLIN HFA) 108 (90 Base) MCG/ACT inhaler Inhale 1-2 puffs  into the lungs every 6 (six) hours as needed for wheezing or shortness of breath. 8 g 5   famotidine (PEPCID) 20 MG tablet Take 1 tablet (20 mg total) by mouth at bedtime. (Patient not taking: Reported on 12/28/2020)     LINZESS 145 MCG CAPS capsule TAKE 1 CAPSULE ONCE DAILY BEFORE BREAKFAST. (Patient not taking: Reported on 12/28/2020) 90 capsule 3   melatonin 3 MG TABS tablet Take 1 tablet (3 mg total) by mouth at bedtime. (Patient not taking: Reported on 12/28/2020)     mirtazapine (REMERON) 7.5 MG tablet Take 1 tablet (7.5 mg total) by mouth at bedtime. (Patient not taking: Reported on 12/28/2020)     No facility-administered medications prior to visit.    Allergies  Allergen Reactions   Hydralazine Other (See Comments)    Breathing   Nalbuphine Rash   Paroxetine Itching   Sulfa Antibiotics Other (See Comments)    Childhood reaction.   Venlafaxine Itching    ROS Review of Systems  Constitutional:  Positive for fatigue. Negative for appetite change, chills and fever.  HENT:  Negative for congestion, sinus pressure, sinus pain and sore throat.   Eyes:  Negative for pain and discharge.  Respiratory:  Negative for cough and shortness of breath.   Cardiovascular:  Positive for leg swelling. Negative for chest pain and palpitations.  Gastrointestinal:  Negative for abdominal pain, constipation, diarrhea, nausea and vomiting.  Endocrine: Negative for polydipsia and polyuria.  Genitourinary:  Negative for dysuria and hematuria.  Musculoskeletal:  Positive for arthralgias. Negative for neck pain and neck stiffness.  Skin:  Negative for rash.  Neurological:  Negative for dizziness and weakness.  Psychiatric/Behavioral:  Negative for agitation and behavioral problems.      Objective:    Physical Exam Vitals reviewed.  Constitutional:      General: She is not in acute distress.    Appearance: She is not diaphoretic.     Comments: In wheelchair  HENT:     Head: Normocephalic and  atraumatic.     Nose: Nose normal.  Mouth/Throat:     Mouth: Mucous membranes are moist.  Eyes:     General: No scleral icterus.    Extraocular Movements: Extraocular movements intact.  Cardiovascular:     Rate and Rhythm: Normal rate and regular rhythm.     Pulses: Normal pulses.     Heart sounds: Normal heart sounds. No murmur heard. Pulmonary:     Breath sounds: Normal breath sounds. No wheezing or rales.  Abdominal:     Palpations: Abdomen is soft.     Tenderness: There is no abdominal tenderness.  Musculoskeletal:        General: Swelling (Right knee, no warmth) present.     Cervical back: Neck supple. No tenderness.     Right lower leg: Edema (2+) present.     Left lower leg: Edema (1+) present.  Skin:    General: Skin is warm.     Findings: Erythema (Senile purpuric lesions (b/l UE)) present.  Neurological:     General: No focal deficit present.     Mental Status: She is alert and oriented to person, place, and time. Mental status is at baseline.     Sensory: No sensory deficit.  Psychiatric:        Mood and Affect: Mood normal.        Behavior: Behavior normal.    BP (!) 161/91 (BP Location: Left Arm, Patient Position: Sitting, Cuff Size: Normal)   Pulse 63   Temp 97.9 F (36.6 C) (Oral)   Resp 18   Ht 5' (1.524 m)   SpO2 96%   BMI 22.77 kg/m  Wt Readings from Last 3 Encounters:  11/12/20 116 lb 9.6 oz (52.9 kg)  10/28/20 116 lb 9.6 oz (52.9 kg)  10/24/20 116 lb 9.6 oz (52.9 kg)     Health Maintenance Due  Topic Date Due   Zoster Vaccines- Shingrix (1 of 2) Never done   COVID-19 Vaccine (2 - Moderna risk series) 11/29/2020    There are no preventive care reminders to display for this patient.  Lab Results  Component Value Date   TSH 1.466 10/24/2020   Lab Results  Component Value Date   WBC 6.5 10/31/2020   HGB 11.2 (L) 10/31/2020   HCT 33.5 (L) 10/31/2020   MCV 88.4 10/31/2020   PLT 236 10/31/2020   Lab Results  Component Value Date    NA 139 10/31/2020   K 3.8 10/31/2020   CO2 22 10/31/2020   GLUCOSE 101 (H) 10/31/2020   BUN 47 (H) 10/31/2020   CREATININE 2.49 (H) 10/31/2020   BILITOT 1.0 10/25/2020   ALKPHOS 46 10/25/2020   AST 23 10/25/2020   ALT 11 10/25/2020   PROT 6.3 (L) 10/25/2020   ALBUMIN 3.3 (L) 10/25/2020   CALCIUM 9.4 10/31/2020   ANIONGAP 9 10/31/2020   EGFR 9 (L) 09/29/2020   Lab Results  Component Value Date   CHOL 164 09/17/2018   Lab Results  Component Value Date   HDL 44 09/17/2018   Lab Results  Component Value Date   LDLCALC 95 09/17/2018   Lab Results  Component Value Date   TRIG 145 09/17/2018   Lab Results  Component Value Date   CHOLHDL 2.9 07/15/2008   Lab Results  Component Value Date   HGBA1C 5.3 06/14/2020      Assessment & Plan:   Problem List Items Addressed This Visit       Cardiovascular and Mediastinum   Malignant hypertension - Primary    BP Readings  from Last 1 Encounters:  12/28/20 (!) 161/91  Uncontrolled with Coreg 6.25 mg twice daily and lisinopril 10 mg daily due to noncompliance Counseled for compliance with the medications Advised DASH diet and moderate exercise/walking as tolerated Chronic pain and CKD also contributing to high BP Needs a follow up visit with Nephrologist      Relevant Medications   carvedilol (COREG) 6.25 MG tablet   Other Relevant Orders   CBC with Differential/Platelet     Respiratory   Chronic obstructive pulmonary disease, unspecified (HCC)    Controlled with Albuterol inhaler PRN      Relevant Medications   albuterol (VENTOLIN HFA) 108 (90 Base) MCG/ACT inhaler     Digestive   Gastro-esophageal reflux disease without esophagitis    On Pantoprazole and Carafate - now better Has been taking Pantoprazole BID, advised to take it QD now F/u with GI      Relevant Medications   pantoprazole (PROTONIX) 40 MG tablet     Endocrine   Hypothyroidism    Continue Levothyroxine to 88 mcg QD, takes it  inconsistently Check TSH and T4 Has been having throat pain and difficulty swallowing at times, check US thyroid      Relevant Medications   carvedilol (COREG) 6.25 MG tablet   Other Relevant Orders   TSH + free T4   US THYROID     Musculoskeletal and Integument   OA (osteoarthritis)   Relevant Medications   UNABLE TO FIND     Genitourinary   CKD (chronic kidney disease) stage 4, GFR 15-29 ml/min (HCC)    Follows up with nephrologist, needs a follow up visit Follows up with hematooncologist for evaluation of elevated light chain levels Advised to follow renal diet as advised by nephrologist and dietitian Avoid nephrotoxic agents including NSAIDs Needs to clarify medications - was on HCTZ and Lasix in the past, on no diuretics currently, had hospitalization for AKI on CKD as well      Relevant Orders   CMP14+EGFR   Vitamin D (25 hydroxy)     Other   Physical deconditioning Uses a wheelchair, needs a smaller one to easily move in the home - prescription provided   Other Visit Diagnoses     Need for immunization against influenza       Relevant Orders   Flu Vaccine QUAD High Dose(Fluad) (Completed)       Meds ordered this encounter  Medications   pantoprazole (PROTONIX) 40 MG tablet    Sig: Take 1 tablet (40 mg total) by mouth daily.    Dispense:  90 tablet    Refill:  1   UNABLE TO FIND    Sig: Wheelchair (small) - OA and physical deconditioning (M19.90, R53.81)    Dispense:  1 each    Refill:  0   albuterol (VENTOLIN HFA) 108 (90 Base) MCG/ACT inhaler    Sig: Inhale 1-2 puffs into the lungs every 6 (six) hours as needed for wheezing or shortness of breath.    Dispense:  8 g    Refill:  5    Follow-up: Return in about 3 months (around 03/30/2021) for HTN and hypothyroidism.    Lindell Spar, MD

## 2020-12-28 NOTE — Assessment & Plan Note (Signed)
Controlled with Albuterol inhaler PRN

## 2020-12-28 NOTE — Assessment & Plan Note (Signed)
Continue Levothyroxine to 88 mcg QD, takes it inconsistently Check TSH and T4 Has been having throat pain and difficulty swallowing at times, check US thyroid

## 2020-12-29 ENCOUNTER — Telehealth: Payer: Self-pay | Admitting: Internal Medicine

## 2020-12-29 LAB — CMP14+EGFR
ALT: 7 IU/L (ref 0–32)
AST: 14 IU/L (ref 0–40)
Albumin/Globulin Ratio: 1.5 (ref 1.2–2.2)
Albumin: 3.9 g/dL (ref 3.6–4.6)
Alkaline Phosphatase: 67 IU/L (ref 44–121)
BUN/Creatinine Ratio: 9 — ABNORMAL LOW (ref 12–28)
BUN: 19 mg/dL (ref 8–27)
Bilirubin Total: 0.3 mg/dL (ref 0.0–1.2)
CO2: 19 mmol/L — ABNORMAL LOW (ref 20–29)
Calcium: 9.4 mg/dL (ref 8.7–10.3)
Chloride: 105 mmol/L (ref 96–106)
Creatinine, Ser: 2.15 mg/dL — ABNORMAL HIGH (ref 0.57–1.00)
Globulin, Total: 2.6 g/dL (ref 1.5–4.5)
Glucose: 98 mg/dL (ref 70–99)
Potassium: 4.7 mmol/L (ref 3.5–5.2)
Sodium: 141 mmol/L (ref 134–144)
Total Protein: 6.5 g/dL (ref 6.0–8.5)
eGFR: 22 mL/min/{1.73_m2} — ABNORMAL LOW (ref >59)

## 2020-12-29 LAB — CBC WITH DIFFERENTIAL/PLATELET
Basophils Absolute: 0.1 10*3/uL (ref 0.0–0.2)
Basos: 1 %
EOS (ABSOLUTE): 0.4 10*3/uL (ref 0.0–0.4)
Eos: 5 %
Hematocrit: 34.1 % (ref 34.0–46.6)
Hemoglobin: 11.2 g/dL (ref 11.1–15.9)
Immature Grans (Abs): 0 10*3/uL (ref 0.0–0.1)
Immature Granulocytes: 0 %
Lymphocytes Absolute: 2.1 10*3/uL (ref 0.7–3.1)
Lymphs: 30 %
MCH: 29.9 pg (ref 26.6–33.0)
MCHC: 32.8 g/dL (ref 31.5–35.7)
MCV: 91 fL (ref 79–97)
Monocytes Absolute: 0.4 10*3/uL (ref 0.1–0.9)
Monocytes: 6 %
Neutrophils Absolute: 4 10*3/uL (ref 1.4–7.0)
Neutrophils: 58 %
Platelets: 354 10*3/uL (ref 150–450)
RBC: 3.75 x10E6/uL — ABNORMAL LOW (ref 3.77–5.28)
RDW: 15.1 % (ref 11.7–15.4)
WBC: 7 10*3/uL (ref 3.4–10.8)

## 2020-12-29 LAB — TSH+FREE T4
Free T4: 1.27 ng/dL (ref 0.82–1.77)
TSH: 8.51 u[IU]/mL — ABNORMAL HIGH (ref 0.450–4.500)

## 2020-12-29 LAB — VITAMIN D 25 HYDROXY (VIT D DEFICIENCY, FRACTURES): Vit D, 25-Hydroxy: 37.7 ng/mL (ref 30.0–100.0)

## 2020-12-29 NOTE — Telephone Encounter (Signed)
Pt advised of lab results with verbal understanding  

## 2020-12-29 NOTE — Telephone Encounter (Signed)
Pt is returning call.  

## 2021-01-05 ENCOUNTER — Ambulatory Visit: Admitting: Internal Medicine

## 2021-01-06 ENCOUNTER — Telehealth: Payer: Self-pay

## 2021-01-06 ENCOUNTER — Ambulatory Visit (HOSPITAL_COMMUNITY)
Admission: RE | Admit: 2021-01-06 | Discharge: 2021-01-06 | Disposition: A | Discharge status: Home / Self Care | Payer: Medicare Other | Source: Ambulatory Visit | Attending: Internal Medicine | Admitting: Internal Medicine

## 2021-01-06 ENCOUNTER — Other Ambulatory Visit: Payer: Self-pay

## 2021-01-06 DIAGNOSIS — E039 Hypothyroidism, unspecified: Secondary | ICD-10-CM | POA: Diagnosis not present

## 2021-01-06 DIAGNOSIS — M542 Cervicalgia: Secondary | ICD-10-CM | POA: Diagnosis not present

## 2021-01-06 DIAGNOSIS — E079 Disorder of thyroid, unspecified: Secondary | ICD-10-CM | POA: Diagnosis not present

## 2021-01-06 NOTE — Telephone Encounter (Signed)
Parking placard

## 2021-01-06 NOTE — Telephone Encounter (Signed)
Patient needs shower chair for the tub, shower hand held with long cord.  Pharmacy: [facility]

## 2021-01-10 DIAGNOSIS — E039 Hypothyroidism, unspecified: Secondary | ICD-10-CM | POA: Diagnosis not present

## 2021-01-10 DIAGNOSIS — I129 Hypertensive chronic kidney disease with stage 1 through stage 4 chronic kidney disease, or unspecified chronic kidney disease: Secondary | ICD-10-CM | POA: Diagnosis not present

## 2021-01-10 DIAGNOSIS — N184 Chronic kidney disease, stage 4 (severe): Secondary | ICD-10-CM | POA: Diagnosis not present

## 2021-01-10 DIAGNOSIS — N2581 Secondary hyperparathyroidism of renal origin: Secondary | ICD-10-CM | POA: Diagnosis not present

## 2021-01-10 DIAGNOSIS — N179 Acute kidney failure, unspecified: Secondary | ICD-10-CM | POA: Diagnosis not present

## 2021-01-10 DIAGNOSIS — R809 Proteinuria, unspecified: Secondary | ICD-10-CM | POA: Diagnosis not present

## 2021-01-11 ENCOUNTER — Other Ambulatory Visit: Payer: Self-pay | Admitting: *Deleted

## 2021-01-11 DIAGNOSIS — M170 Bilateral primary osteoarthritis of knee: Secondary | ICD-10-CM

## 2021-01-11 NOTE — Telephone Encounter (Signed)
Sent to [facility] 

## 2021-01-12 ENCOUNTER — Telehealth: Payer: Self-pay

## 2021-01-12 ENCOUNTER — Other Ambulatory Visit: Payer: Self-pay | Admitting: Internal Medicine

## 2021-01-12 NOTE — Telephone Encounter (Signed)
Called will pick up

## 2021-01-12 NOTE — Telephone Encounter (Signed)
Patient need a new prescription for wheelchair and needs to be faxed to [facility].

## 2021-01-13 ENCOUNTER — Other Ambulatory Visit: Payer: Self-pay | Admitting: *Deleted

## 2021-01-13 ENCOUNTER — Telehealth: Payer: Self-pay | Admitting: Internal Medicine

## 2021-01-13 DIAGNOSIS — M170 Bilateral primary osteoarthritis of knee: Secondary | ICD-10-CM

## 2021-01-13 NOTE — Telephone Encounter (Signed)
Wheelchair prescription faxed to [facility]

## 2021-01-13 NOTE — Telephone Encounter (Signed)
Pt called in for Thyroid results

## 2021-01-13 NOTE — Telephone Encounter (Signed)
LVM for pt to call the office regarding [place] results

## 2021-01-14 DIAGNOSIS — R5381 Other malaise: Secondary | ICD-10-CM | POA: Diagnosis not present

## 2021-01-14 DIAGNOSIS — M170 Bilateral primary osteoarthritis of knee: Secondary | ICD-10-CM | POA: Diagnosis not present

## 2021-01-14 DIAGNOSIS — M1990 Unspecified osteoarthritis, unspecified site: Secondary | ICD-10-CM | POA: Diagnosis not present

## 2021-01-19 ENCOUNTER — Other Ambulatory Visit: Payer: Self-pay | Admitting: Internal Medicine

## 2021-01-19 DIAGNOSIS — E039 Hypothyroidism, unspecified: Secondary | ICD-10-CM

## 2021-01-25 ENCOUNTER — Other Ambulatory Visit: Payer: Self-pay | Admitting: *Deleted

## 2021-01-25 ENCOUNTER — Telehealth: Payer: Self-pay | Admitting: Internal Medicine

## 2021-01-25 DIAGNOSIS — M170 Bilateral primary osteoarthritis of knee: Secondary | ICD-10-CM

## 2021-01-25 DIAGNOSIS — R5381 Other malaise: Secondary | ICD-10-CM

## 2021-01-25 DIAGNOSIS — J439 Emphysema, unspecified: Secondary | ICD-10-CM | POA: Diagnosis not present

## 2021-01-25 DIAGNOSIS — N184 Chronic kidney disease, stage 4 (severe): Secondary | ICD-10-CM | POA: Diagnosis not present

## 2021-01-25 MED ORDER — UNABLE TO FIND: 0 refills | Status: DC

## 2021-01-25 NOTE — Telephone Encounter (Signed)
Pt is stating that she has the cough from the covid and needs something to take, however I asked what she was taking and Ann advised Mucinix DM, and pushing water.  She states that the pt likes to sleep and it is a struggle to get her to sit up --  I advised to continue doing what she was doing until she heard back from the nurse.

## 2021-01-25 NOTE — Telephone Encounter (Signed)
Pt will need to schedule virtual appt with provider

## 2021-01-31 ENCOUNTER — Ambulatory Visit: Payer: Medicare Other | Admitting: Internal Medicine

## 2021-01-31 DIAGNOSIS — M797 Fibromyalgia: Secondary | ICD-10-CM | POA: Diagnosis not present

## 2021-01-31 DIAGNOSIS — G894 Chronic pain syndrome: Secondary | ICD-10-CM | POA: Diagnosis not present

## 2021-01-31 DIAGNOSIS — M130 Polyarthritis, unspecified: Secondary | ICD-10-CM | POA: Diagnosis not present

## 2021-01-31 DIAGNOSIS — Z79891 Long term (current) use of opiate analgesic: Secondary | ICD-10-CM | POA: Diagnosis not present

## 2021-02-14 DIAGNOSIS — M1990 Unspecified osteoarthritis, unspecified site: Secondary | ICD-10-CM | POA: Diagnosis not present

## 2021-02-14 DIAGNOSIS — M170 Bilateral primary osteoarthritis of knee: Secondary | ICD-10-CM | POA: Diagnosis not present

## 2021-02-14 DIAGNOSIS — R5381 Other malaise: Secondary | ICD-10-CM | POA: Diagnosis not present

## 2021-03-01 ENCOUNTER — Encounter (HOSPITAL_COMMUNITY): Payer: Self-pay | Admitting: *Deleted

## 2021-03-01 ENCOUNTER — Emergency Department (HOSPITAL_COMMUNITY): Payer: Medicare Other

## 2021-03-01 ENCOUNTER — Other Ambulatory Visit: Payer: Self-pay

## 2021-03-01 ENCOUNTER — Emergency Department (HOSPITAL_COMMUNITY)
Admission: EM | Admit: 2021-03-01 | Discharge: 2021-03-01 | Disposition: A | Discharge status: Home / Self Care | Payer: Medicare Other | Attending: Emergency Medicine | Admitting: Emergency Medicine

## 2021-03-01 DIAGNOSIS — I1310 Hypertensive heart and chronic kidney disease without heart failure, with stage 1 through stage 4 chronic kidney disease, or unspecified chronic kidney disease: Secondary | ICD-10-CM | POA: Insufficient documentation

## 2021-03-01 DIAGNOSIS — N183 Chronic kidney disease, stage 3 unspecified: Secondary | ICD-10-CM | POA: Insufficient documentation

## 2021-03-01 DIAGNOSIS — E039 Hypothyroidism, unspecified: Secondary | ICD-10-CM | POA: Insufficient documentation

## 2021-03-01 DIAGNOSIS — R0682 Tachypnea, not elsewhere classified: Secondary | ICD-10-CM | POA: Diagnosis not present

## 2021-03-01 DIAGNOSIS — R0789 Other chest pain: Secondary | ICD-10-CM | POA: Insufficient documentation

## 2021-03-01 DIAGNOSIS — Z743 Need for continuous supervision: Secondary | ICD-10-CM | POA: Diagnosis not present

## 2021-03-01 DIAGNOSIS — J449 Chronic obstructive pulmonary disease, unspecified: Secondary | ICD-10-CM | POA: Insufficient documentation

## 2021-03-01 DIAGNOSIS — I517 Cardiomegaly: Secondary | ICD-10-CM | POA: Diagnosis not present

## 2021-03-01 DIAGNOSIS — R059 Cough, unspecified: Secondary | ICD-10-CM | POA: Diagnosis not present

## 2021-03-01 DIAGNOSIS — R062 Wheezing: Secondary | ICD-10-CM | POA: Diagnosis not present

## 2021-03-01 DIAGNOSIS — R0602 Shortness of breath: Secondary | ICD-10-CM | POA: Diagnosis not present

## 2021-03-01 DIAGNOSIS — Z20822 Contact with and (suspected) exposure to covid-19: Secondary | ICD-10-CM | POA: Insufficient documentation

## 2021-03-01 DIAGNOSIS — I499 Cardiac arrhythmia, unspecified: Secondary | ICD-10-CM | POA: Diagnosis not present

## 2021-03-01 DIAGNOSIS — Z87891 Personal history of nicotine dependence: Secondary | ICD-10-CM | POA: Insufficient documentation

## 2021-03-01 DIAGNOSIS — R0902 Hypoxemia: Secondary | ICD-10-CM | POA: Diagnosis not present

## 2021-03-01 DIAGNOSIS — Z79899 Other long term (current) drug therapy: Secondary | ICD-10-CM | POA: Diagnosis not present

## 2021-03-01 DIAGNOSIS — Z8616 Personal history of COVID-19: Secondary | ICD-10-CM | POA: Insufficient documentation

## 2021-03-01 DIAGNOSIS — J45909 Unspecified asthma, uncomplicated: Secondary | ICD-10-CM | POA: Insufficient documentation

## 2021-03-01 DIAGNOSIS — R112 Nausea with vomiting, unspecified: Secondary | ICD-10-CM | POA: Diagnosis not present

## 2021-03-01 LAB — CBC
HCT: 34.9 % — ABNORMAL LOW (ref 36.0–46.0)
Hemoglobin: 11.6 g/dL — ABNORMAL LOW (ref 12.0–15.0)
MCH: 29.1 pg (ref 26.0–34.0)
MCHC: 33.2 g/dL (ref 30.0–36.0)
MCV: 87.5 fL (ref 80.0–100.0)
Platelets: 461 10*3/uL — ABNORMAL HIGH (ref 150–400)
RBC: 3.99 MIL/uL (ref 3.87–5.11)
RDW: 13.3 % (ref 11.5–15.5)
WBC: 21 10*3/uL — ABNORMAL HIGH (ref 4.0–10.5)
nRBC: 0 % (ref 0.0–0.2)

## 2021-03-01 LAB — BASIC METABOLIC PANEL
Anion gap: 11 (ref 5–15)
BUN: 33 mg/dL — ABNORMAL HIGH (ref 8–23)
CO2: 22 mmol/L (ref 22–32)
Calcium: 9 mg/dL (ref 8.9–10.3)
Chloride: 94 mmol/L — ABNORMAL LOW (ref 98–111)
Creatinine, Ser: 2.58 mg/dL — ABNORMAL HIGH (ref 0.44–1.00)
GFR, Estimated: 18 mL/min — ABNORMAL LOW (ref >60)
Glucose, Bld: 107 mg/dL — ABNORMAL HIGH (ref 70–99)
Potassium: 4.3 mmol/L (ref 3.5–5.1)
Sodium: 127 mmol/L — ABNORMAL LOW (ref 135–145)

## 2021-03-01 LAB — BLOOD GAS, VENOUS
Acid-base deficit: 2.8 mmol/L — ABNORMAL HIGH (ref 0.0–2.0)
Bicarbonate: 20.5 mmol/L (ref 20.0–28.0)
FIO2: 21
O2 Saturation: 43.7 %
Patient temperature: 36.6
pCO2, Ven: 46.1 mmHg (ref 44.0–60.0)
pH, Ven: 7.308 (ref 7.250–7.430)
pO2, Ven: <31 mmHg — CL (ref 32.0–45.0)

## 2021-03-01 LAB — RESP PANEL BY RT-PCR (FLU A&B, COVID) ARPGX2
Influenza A by PCR: NEGATIVE
Influenza B by PCR: NEGATIVE
SARS Coronavirus 2 by RT PCR: NEGATIVE

## 2021-03-01 LAB — TROPONIN I (HIGH SENSITIVITY)
Troponin I (High Sensitivity): 12 ng/L (ref <18)
Troponin I (High Sensitivity): 11 ng/L (ref <18)

## 2021-03-01 LAB — BRAIN NATRIURETIC PEPTIDE: B Natriuretic Peptide: 146 pg/mL — ABNORMAL HIGH (ref 0.0–100.0)

## 2021-03-01 MED ORDER — AZITHROMYCIN 250 MG PO TABS: ORAL_TABLET | ORAL | 0 refills | Status: AC

## 2021-03-01 MED ORDER — AMOXICILLIN-POT CLAVULANATE 875-125 MG PO TABS: 1.0000 | ORAL_TABLET | Freq: Two times a day (BID) | ORAL | 0 refills | Status: AC

## 2021-03-01 MED ORDER — IPRATROPIUM-ALBUTEROL 0.5-2.5 (3) MG/3ML IN SOLN
3.0000 mL | Freq: Once | RESPIRATORY_TRACT | Status: AC
  Administered 2021-03-01: 3 mL via RESPIRATORY_TRACT
  Filled 2021-03-01: qty 3

## 2021-03-01 MED ORDER — METHYLPREDNISOLONE SODIUM SUCC 125 MG IJ SOLR
125.0000 mg | Freq: Once | INTRAMUSCULAR | Status: AC
  Administered 2021-03-01: 125 mg via INTRAVENOUS
  Filled 2021-03-01: qty 2

## 2021-03-01 NOTE — ED Notes (Signed)
Dc instructions and scripts reviewed with pt and pt daughter no questions or concerns at this time. Will follow up with PCP next week. PT wheeled out to car.

## 2021-03-01 NOTE — ED Triage Notes (Signed)
Pt from home with c/o SOB x 4 months off and on per pt.  Hx of COPD, reported with some wheezing. O2 applied at 3 L/M per EMS, RA sats 92-93%. Denies home O2

## 2021-03-01 NOTE — Discharge Instructions (Addendum)
You were seen in the emergency department today for shortness of breath.  While you were here we did a thorough work-up.  It may be that you have community-acquired pneumonia and therefore I am treating you with antibiotics.  You will take the antibiotics for the next 7 days.  Please return to the emergency department for worsening fever or shortness of breath.  Please follow-up with your primary care provider in a week.

## 2021-03-01 NOTE — ED Notes (Signed)
PO2 < 30
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
## 2021-03-07 DIAGNOSIS — E871 Hypo-osmolality and hyponatremia: Secondary | ICD-10-CM | POA: Diagnosis not present

## 2021-03-07 DIAGNOSIS — I129 Hypertensive chronic kidney disease with stage 1 through stage 4 chronic kidney disease, or unspecified chronic kidney disease: Secondary | ICD-10-CM | POA: Diagnosis not present

## 2021-03-07 DIAGNOSIS — N179 Acute kidney failure, unspecified: Secondary | ICD-10-CM | POA: Diagnosis not present

## 2021-03-07 DIAGNOSIS — R809 Proteinuria, unspecified: Secondary | ICD-10-CM | POA: Diagnosis not present

## 2021-03-07 DIAGNOSIS — N2581 Secondary hyperparathyroidism of renal origin: Secondary | ICD-10-CM | POA: Diagnosis not present

## 2021-03-07 DIAGNOSIS — N184 Chronic kidney disease, stage 4 (severe): Secondary | ICD-10-CM | POA: Diagnosis not present

## 2021-03-07 DIAGNOSIS — E039 Hypothyroidism, unspecified: Secondary | ICD-10-CM | POA: Diagnosis not present

## 2021-03-09 ENCOUNTER — Other Ambulatory Visit: Payer: Self-pay | Admitting: Student

## 2021-03-09 ENCOUNTER — Other Ambulatory Visit: Payer: Self-pay | Admitting: Internal Medicine

## 2021-03-09 ENCOUNTER — Ambulatory Visit: Payer: Medicare Other | Admitting: Student

## 2021-03-09 NOTE — Progress Notes (Deleted)
Cardiology Office Note    Date:  03/09/2021   ID:  Jessica, [name] 1935/07/05, MRN [id number]  PCP:  Lindell Spar, MD  Cardiologist: Dorris Carnes, MD    No chief complaint on file.   History of Present Illness:    [name] is a 85 y.o. female with past medical history of HTN, HLD, palpitations (PAC's, PVC's and SVT by prior monitor), nonobstructive CAD by cardiac catheterization in 2002 (low-risk NST in 03/2011), hypothyroidism, GERD, Stage 3-4 CKD and COPD who presents to the office today for 72-month follow-up.  She was last examined myself in 05/2020 and reported worsening arthritis along her knees which is limiting her activity but denied any recent anginal symptoms.  She reported her palpitations had overall been well controlled, therefore she was continued on Coreg 6.25 mg twice daily for her palpitations along with being on Amlodipine 10 mg daily, HCTZ 25 mg daily and Lisinopril 5 mg daily for HTN.  She was hospitalized to [facility] in 10/2020 for an AKI with creatinine elevated 8.9 on admission with all of her antihypertensive medications and diuretics being discontinued at discharge.  In the interim, it does appear Coreg has been restarted at 6.25 mg twice daily by her PCP.  Creatinine had also improved to 2.58 by recent labs on 03/01/2021.   Past Medical History:  Diagnosis Date   Anxiety disorder, unspecified    Arteriosclerotic cardiovascular disease (ASCVD) 2006   Nonobstructive; < 50% lesions on cath 2002; negative stress nuclear study in 10/2004   Asthma    Chest pain, unspecified    Chronic kidney disease, unspecified    Chronic obstructive pulmonary disease, unspecified ([place])    Chronic pelvic pain in female    Colitis due to Clostridium difficile 1994   1994   COPD (chronic obstructive pulmonary disease) ([place])    Depression with anxiety    Diarrhea, unspecified    Disorder of thyroid, unspecified    Gastro-esophageal reflux disease without esophagitis     Gastroesophageal reflux disease    Hyperlipidemia    Hyperlipidemia, unspecified    Hypertension    Hypertensive chronic kidney disease with stage 1 through stage 4 chronic kidney disease, or unspecified chronic kidney disease    Hyperthyroidism    Hypothyroidism    Hypothyroidism, unspecified    Kidney disease, chronic, stage III (moderate, EGFR 30-59 ml/min) (HCC)    Lower abdominal pain, unspecified    Major depressive disorder, single episode, severe without psychotic features ([place])    Major depressive disorder, single episode, unspecified    Nausea    Other postherpetic nervous system involvement    Pelvic and perineal pain    Rash and other nonspecific skin eruption    S/P colonoscopy 2007   few diverticula, otherwise nl   S/P colonoscopy 12/13/10   rectal, cecal polyp, left-sided diverticulosis, hyperplastic. ?anal fissure? treated empirically   S/P endoscopy 2009   linear esophageal erosions   S/P endoscopy 05/10/10   Question island of salmon-colored epithelium in distal esophagus ; no Barrett's.    Shingles    Tobacco abuse, in remission    55-pack-year consumption; discontinued 08/2010   Unspecified asthma, uncomplicated    Unspecified osteoarthritis, unspecified site    Urinary tract infection, site not specified     Past Surgical History:  Procedure Laterality Date   ABDOMINAL HYSTERECTOMY     BREAST LUMPECTOMY     [place]  COLONOSCOPY  12/13/2010   Left-sided diverticulosis.  Cecal polyp, status post hot snare polypectomy/ Diminutive rectal polyp, status post cold biopsy removal tender/painful anal canal, ? occult anal fissure- Not visualized   COLONOSCOPY N/A 10/14/2015   Diverticulosis   ESOPHAGOGASTRODUODENOSCOPY  05/10/10   benign mucosa with mild chronic inflammation.   ESOPHAGOGASTRODUODENOSCOPY (EGD) WITH PROPOFOL N/A 02/13/2019   Procedure: ESOPHAGOGASTRODUODENOSCOPY (EGD) WITH PROPOFOL;  Surgeon: Corbin Ade,  MD;  Location: [facility];  Service: Endoscopy;  Laterality: N/A;  3:00pm   FLEXIBLE SIGMOIDOSCOPY  12/29/2011   Procedure: FLEXIBLE SIGMOIDOSCOPY;  Surgeon: Malissa Hippo, MD;  Location: [facility];  Service: Endoscopy;  Laterality: N/A;  200-Ann notified pt to be here @ 1:00   NISSEN FUNDOPLICATION     YAG LASER APPLICATION Left 05/25/2014   Procedure: YAG LASER APPLICATION;  Surgeon: Susa Simmonds, MD;  Location: [facility] OR[facility];  Service: Ophthalmology;  Laterality: Left;    Current Medications: Outpatient Medications Prior to Visit  Medication Sig Dispense Refill   albuterol (VENTOLIN HFA) 108 (90 Base) MCG/ACT inhaler Inhale 1-2 puffs into the lungs every 6 (six) hours as needed for wheezing or shortness of breath. 8 g 5   atorvastatin (LIPITOR) 20 MG tablet TAKE ONE TABLET BY MOUTH ONCE DAILY. 90 tablet 0   carvedilol (COREG) 6.25 MG tablet Take 6.25 mg by mouth 2 (two) times daily.     DULoxetine (CYMBALTA) 30 MG capsule Take 1 capsule (30 mg total) by mouth daily. 90 capsule 0   gabapentin (NEURONTIN) 100 MG capsule Take 1 capsule (100 mg total) by mouth 2 (two) times daily. (Patient not taking: Reported on 12/28/2020)     HYDROcodone-acetaminophen (NORCO) 7.5-325 MG tablet Take 1 tablet by mouth every 6 (six) hours as needed for severe pain. 30 tablet 0   levothyroxine (SYNTHROID) 88 MCG tablet TAKE (1) TABLET BY MOUTH EACH MORNING. 90 tablet 0   Multiple Vitamins-Minerals (EYE VITAMINS PO) Take 1 tablet by mouth daily.      ondansetron (ZOFRAN) 4 MG tablet TAKE 1 TABLET BY MOUTH THREE TIMES AS NEEDED FOR NAUSEA. 90 tablet 0   pantoprazole (PROTONIX) 40 MG tablet Take 1 tablet (40 mg total) by mouth daily. 90 tablet 1   UNABLE TO FIND Wheelchair (small) - OA and physical deconditioning (M19.90, R53.81) 1 each 0   UNABLE TO FIND Hand held shower head with long cord 1 each 0   No facility-administered medications prior to visit.     Allergies:   Hydralazine, Nalbuphine, Paroxetine,  Sulfa antibiotics, and Venlafaxine   Social History   Socioeconomic History   Marital status: Married    Spouse name: Not on file   Number of children: 4   Years of education: Not on file   Highest education level: Not on file  Occupational History   Not on file  Tobacco Use   Smoking status: Former    Packs/day: 1.00    Years: 55.00    Pack years: 55.00    Types: Cigarettes    Quit date: 09/15/2010    Years since quitting: 10.4   Smokeless tobacco: Never  Vaping Use   Vaping Use: Never used  Substance and Sexual Activity   Alcohol use: No    Alcohol/week: 0.0 standard drinks   Drug use: No   Sexual activity: Not Currently  Other Topics Concern   Not on file  Social History Narrative   Not on file   Social Determinants of Health  Financial Resource Strain: Low Risk    Difficulty of Paying Living Expenses: Not hard at all  Food Insecurity: No Food Insecurity   Worried About Charity fundraiser in the Last Year: Never true   Ran Out of Food in the Last Year: Never true  Transportation Needs: No Transportation Needs   Lack of Transportation (Medical): No   Lack of Transportation (Non-Medical): No  Physical Activity: Inactive   Days of Exercise per Week: 0 days   Minutes of Exercise per Session: 0 min  Stress: No Stress Concern Present   Feeling of Stress : Not at all  Social Connections: Moderately Isolated   Frequency of Communication with Friends and Family: More than three times a week   Frequency of Social Gatherings with Friends and Family: More than three times a week   Attends Religious Services: Never   Marine scientist or Organizations: No   Attends Archivist Meetings: Never   Marital Status: Married     Family History:  The patient's ***family history includes Alzheimer's disease in her mother; Aneurysm in her son; Cancer in her mother, sister, and son; Diabetes in her mother and son; Heart disease in her brother and father;  Hypertension in her brother.   Review of Systems:    Please see the history of present illness.     All other systems reviewed and are otherwise negative except as noted above.   Physical Exam:    VS:  There were no vitals taken for this visit.   General: Well developed, well nourished,female appearing in no acute distress. Head: Normocephalic, atraumatic. Neck: No carotid bruits. JVD not elevated.  Lungs: Respirations regular and unlabored, without wheezes or rales.  Heart: ***Regular rate and rhythm. No S3 or S4.  No murmur, no rubs, or gallops appreciated. Abdomen: Appears non-distended. No obvious abdominal masses. Msk:  Strength and tone appear normal for age. No obvious joint deformities or effusions. Extremities: No clubbing or cyanosis. No edema.  Distal pedal pulses are 2+ bilaterally. Neuro: Alert and oriented X 3. Moves all extremities spontaneously. No focal deficits noted. Psych:  Responds to questions appropriately with a normal affect. Skin: No rashes or lesions noted  Wt Readings from Last 3 Encounters:  03/01/21 116 lb 10 oz (52.9 kg)  11/12/20 116 lb 9.6 oz (52.9 kg)  10/28/20 116 lb 9.6 oz (52.9 kg)        Studies/Labs Reviewed:   EKG:  EKG is*** ordered today.  The ekg ordered today demonstrates ***  Recent Labs: 10/27/2020: Magnesium 1.8 12/28/2020: ALT 7; TSH 8.510 03/01/2021: B Natriuretic Peptide 146.0; BUN 33; Creatinine, Ser 2.58; Hemoglobin 11.6; Platelets 461; Potassium 4.3; Sodium 127   Lipid Panel    Component Value Date/Time   CHOL 164 09/17/2018 0000   TRIG 145 09/17/2018 0000   HDL 44 09/17/2018 0000   CHOLHDL 2.9 07/15/2008 0528   VLDL 23 07/15/2008 0528   LDLCALC 95 09/17/2018 0000    Additional studies/ records that were reviewed today include:   Echocardiogram: 09/2015 Study Conclusions   - Left ventricle: The cavity size was normal. Wall thickness was    normal. Systolic function was normal. The estimated ejection     fraction was in the range of 55% to 60%. Wall motion was normal;    there were no regional wall motion abnormalities. Doppler    parameters are consistent with abnormal left ventricular    relaxation (grade 1 diastolic dysfunction).  - Aortic  valve: Trileaflet; mildly calcified leaflets.  - Mitral valve: Mildly thickened leaflets . There was mild    regurgitation.  - Left atrium: The atrium was at the upper limits of normal in    size.  - Right atrium: Central venous pressure (est): 3 mm Hg.  - Tricuspid valve: There was trivial regurgitation.  - Pulmonary arteries: PA peak pressure: 34 mm Hg (S).  - Pericardium, extracardiac: There was no pericardial effusion.   Impressions:   - Normal LV wall thickness with LVEF 55-60%. Grade 1 diastolic    dysfunction with intermediate LV filling pressure. Upper normal    left atrial chamber size. Mild mitral regurgitation. Trivial    tricuspid regurgitation with PASP 34 mmHg.   Event Monitor: 08/2019 Sinus rhythm  With occasional PACs, short burst SVT(5 beats)   42 to 171 bpm   Average HR 60 NO significant pauses   Assessment:    No diagnosis found.   Plan:   In order of problems listed above:  ***    Shared Decision Making/Informed Consent:   {Are you ordering a CV Procedure (e.g. stress test, cath, DCCV, TEE, etc)?   Press F2        :[id number]}    Medication Adjustments/Labs and Tests Ordered: Current medicines are reviewed at length with the patient today.  Concerns regarding medicines are outlined above.  Medication changes, Labs and Tests ordered today are listed in the Patient Instructions below. There are no Patient Instructions on file for this visit.   Signed, Erma Heritage, PA-C  03/09/2021 8:03 AM    [facility]. [address] [place], [place] [address] Phone: [phone] Fax: [phone]

## 2021-03-10 ENCOUNTER — Ambulatory Visit (INDEPENDENT_AMBULATORY_CARE_PROVIDER_SITE_OTHER): Payer: Medicare Other | Admitting: Internal Medicine

## 2021-03-10 ENCOUNTER — Encounter: Payer: Self-pay | Admitting: Internal Medicine

## 2021-03-10 ENCOUNTER — Other Ambulatory Visit: Payer: Self-pay

## 2021-03-10 VITALS — BP 152/84 | HR 63 | Resp 18 | Ht 63.0 in

## 2021-03-10 DIAGNOSIS — I5032 Chronic diastolic (congestive) heart failure: Secondary | ICD-10-CM | POA: Diagnosis not present

## 2021-03-10 DIAGNOSIS — Z0001 Encounter for general adult medical examination with abnormal findings: Secondary | ICD-10-CM | POA: Insufficient documentation

## 2021-03-10 DIAGNOSIS — E039 Hypothyroidism, unspecified: Secondary | ICD-10-CM

## 2021-03-10 DIAGNOSIS — Z09 Encounter for follow-up examination after completed treatment for conditions other than malignant neoplasm: Secondary | ICD-10-CM | POA: Diagnosis not present

## 2021-03-10 DIAGNOSIS — J439 Emphysema, unspecified: Secondary | ICD-10-CM | POA: Diagnosis not present

## 2021-03-10 DIAGNOSIS — B3731 Acute candidiasis of vulva and vagina: Secondary | ICD-10-CM | POA: Diagnosis not present

## 2021-03-10 DIAGNOSIS — I10 Essential (primary) hypertension: Secondary | ICD-10-CM | POA: Diagnosis not present

## 2021-03-10 DIAGNOSIS — L89159 Pressure ulcer of sacral region, unspecified stage: Secondary | ICD-10-CM | POA: Diagnosis not present

## 2021-03-10 DIAGNOSIS — N184 Chronic kidney disease, stage 4 (severe): Secondary | ICD-10-CM | POA: Diagnosis not present

## 2021-03-10 DIAGNOSIS — I5030 Unspecified diastolic (congestive) heart failure: Secondary | ICD-10-CM | POA: Insufficient documentation

## 2021-03-10 DIAGNOSIS — E871 Hypo-osmolality and hyponatremia: Secondary | ICD-10-CM

## 2021-03-10 DIAGNOSIS — E559 Vitamin D deficiency, unspecified: Secondary | ICD-10-CM

## 2021-03-10 MED ORDER — FLUCONAZOLE 150 MG PO TABS: 150.0000 mg | ORAL_TABLET | Freq: Once | ORAL | 0 refills | Status: AC

## 2021-03-10 MED ORDER — FLUTICASONE-SALMETEROL 100-50 MCG/ACT IN AEPB: 1.0000 | INHALATION_SPRAY | Freq: Two times a day (BID) | RESPIRATORY_TRACT | 5 refills | Status: DC

## 2021-03-10 NOTE — Assessment & Plan Note (Signed)
Likely due to recent antibiotic exposure Fluconazole prescribed

## 2021-03-10 NOTE — Assessment & Plan Note (Addendum)
BP Readings from Last 1 Encounters:  03/10/21 (!) 152/84   Uncontrolled with Coreg 6.25 mg twice daily and lisinopril 10 mg daily due to noncompliance Counseled for compliance with the medications Advised DASH diet and moderate exercise/walking as tolerated Chronic pain and CKD also contributing to high BP Followed by Nephrologist

## 2021-03-10 NOTE — Patient Instructions (Addendum)
Please start using Advair for COPD. Okay to use Albuterol as needed for shortness of breath.  Please continue to take other medications as prescribed.

## 2021-03-10 NOTE — Assessment & Plan Note (Signed)
Uncontrolled Has been using Albuterol frequently Added Advair as maintenance therapy Educated about use of maintenance therapy and rescue inhaler

## 2021-03-10 NOTE — Assessment & Plan Note (Signed)
Chronic leg swelling and dyspnea Needs leg elevation for leg swelling For better breathing, head elevation might be beneficial with hospital bed On diuretics

## 2021-03-10 NOTE — Assessment & Plan Note (Signed)
Continue Levothyroxine to 88 mcg QD Check TSH, free T4 and T3 Reviewed US thyroid

## 2021-03-10 NOTE — Progress Notes (Signed)
Established Patient Office Visit  Subjective:  Patient ID: [name], female    DOB: Mar 05, 1936  Age: 85 y.o. MRN: [id number]  CC:  Chief Complaint  Patient presents with   Follow-up    Pt was in er recently for pneumonia they gave her antibiotics now pt has yeast infection, pt needs a hospital bed and need handicap placard refilled out    HPI [name] is a 85 y.o. female with past medical history of uncontrolled hypertension, CKD stage IV, fibromyalgia, chronic pelvic pain, chronic constipation, hypothyroidism, osteoarthritis of knees and depression who presents for f/u of her chronic medical conditions.  COPD exacerbation and CAP, recent ER visit: She went to ER recently with cough and dyspnea and was treated for CAP with Augmentin and Z-Pak.  Her cough has improved now.  She still has to use albuterol inhaler multiple times for dyspnea or wheezing.  She is not on any maintenance inhaler currently.  She denies any fever, chills, chest pain or palpitations currently.  She has been having perineal itching since she took antibiotic for CAP.  Denies any vaginal bleeding or discharge currently.  HTN: Her BP is still uncontrolled.  Compliance is questionable.  Followed by nephrology.  Denies any chest pain, headache or dizziness currently.  CKD stage IV: Followed by nephrology.  She complains of mild dysuria recently.  Denies any fever, chills or hematuria currently.  She is due for urine testing for CKD and hyponatremia.  Hypothyroidism: Has been taking levothyroxine, due for TSH, free T4 and T3 testing.  Her daughter-in-law mentions that she has been having pressure ulcers recurrently.  She recently had her sacral ulcer, which is improving slowly with OTC cream.  She needs frequent repositioning, which has been difficult at home with limited assistance. She is dependent for her ADLs. She also has had chronic leg swelling, which needs leg elevation at times.  She needs elevation of  head more than 45 degrees for her CHF and COPD.  Past Medical History:  Diagnosis Date   Anxiety disorder, unspecified    Arteriosclerotic cardiovascular disease (ASCVD) 2006   Nonobstructive; < 50% lesions on cath 2002; negative stress nuclear study in 10/2004   Asthma    Chest pain, unspecified    Chronic kidney disease, unspecified    Chronic obstructive pulmonary disease, unspecified (HCC)    Chronic pelvic pain in female    Colitis due to Clostridium difficile 1994   1994   COPD (chronic obstructive pulmonary disease) (HCC)    Depression with anxiety    Diarrhea, unspecified    Disorder of thyroid, unspecified    Gastro-esophageal reflux disease without esophagitis    Gastroesophageal reflux disease    Hyperlipidemia    Hyperlipidemia, unspecified    Hypertension    Hypertensive chronic kidney disease with stage 1 through stage 4 chronic kidney disease, or unspecified chronic kidney disease    Hyperthyroidism    Hypothyroidism    Hypothyroidism, unspecified    Kidney disease, chronic, stage III (moderate, EGFR 30-59 ml/min) (HCC)    Lower abdominal pain, unspecified    Major depressive disorder, single episode, severe without psychotic features (HCC)    Major depressive disorder, single episode, unspecified    Nausea    Other postherpetic nervous system involvement    Pelvic and perineal pain    Rash and other nonspecific skin eruption    S/P colonoscopy 2007   few diverticula, otherwise nl   S/P colonoscopy 12/13/10   rectal,  cecal polyp, left-sided diverticulosis, hyperplastic. ?anal fissure? treated empirically   S/P endoscopy 2009   linear esophageal erosions   S/P endoscopy 05/10/10   Question island of salmon-colored epithelium in distal esophagus ; no Barrett's.    Shingles    Tobacco abuse, in remission    55-pack-year consumption; discontinued 08/2010   Unspecified asthma, uncomplicated    Unspecified osteoarthritis, unspecified site    Urinary tract  infection, site not specified     Past Surgical History:  Procedure Laterality Date   ABDOMINAL HYSTERECTOMY     BREAST LUMPECTOMY     CATARACT EXTRACTION     CHOLECYSTECTOMY  1962   COLONOSCOPY  12/13/2010   Left-sided diverticulosis.  Cecal polyp, status post hot snare polypectomy/ Diminutive rectal polyp, status post cold biopsy removal tender/painful anal canal, ? occult anal fissure- Not visualized   COLONOSCOPY N/A 10/14/2015   Diverticulosis   ESOPHAGOGASTRODUODENOSCOPY  05/10/10   benign mucosa with mild chronic inflammation.   ESOPHAGOGASTRODUODENOSCOPY (EGD) WITH PROPOFOL N/A 02/13/2019   Procedure: ESOPHAGOGASTRODUODENOSCOPY (EGD) WITH PROPOFOL;  Surgeon: Daneil Dolin, MD;  Location: [facility];  Service: Endoscopy;  Laterality: N/A;  3:00pm   FLEXIBLE SIGMOIDOSCOPY  12/29/2011   Procedure: FLEXIBLE SIGMOIDOSCOPY;  Surgeon: Rogene Houston, MD;  Location: [facility];  Service: Endoscopy;  Laterality: N/A;  200-Ann notified pt to be here @ 0:92   NISSEN FUNDOPLICATION     YAG LASER APPLICATION Left 06/23/760   Procedure: YAG LASER APPLICATION;  Surgeon: Williams Che, MD;  Location: [facility];  Service: Ophthalmology;  Laterality: Left;    Family History  Problem Relation Age of Onset   Diabetes Mother    Alzheimer's disease Mother    Cancer Mother    Heart disease Father    Cancer Sister    Heart disease Brother        Also mother, Father, brother and 2 sons   Cancer Son        testicular cancer    Aneurysm Son    Diabetes Son    Hypertension Brother        also Sister x2   Colon cancer Neg Hx     Social History   Socioeconomic History   Marital status: Married    Spouse name: Not on file   Number of children: 4   Years of education: Not on file   Highest education level: Not on file  Occupational History   Not on file  Tobacco Use   Smoking status: Former    Packs/day: 1.00    Years: 55.00    Pack years: 55.00    Types: Cigarettes    Quit  date: 09/15/2010    Years since quitting: 10.4   Smokeless tobacco: Never  Vaping Use   Vaping Use: Never used  Substance and Sexual Activity   Alcohol use: No    Alcohol/week: 0.0 standard drinks   Drug use: No   Sexual activity: Not Currently  Other Topics Concern   Not on file  Social History Narrative   Not on file   Social Determinants of Health   Financial Resource Strain: Low Risk    Difficulty of Paying Living Expenses: Not hard at all  Food Insecurity: No Food Insecurity   Worried About Charity fundraiser in the Last Year: Never true   [place] in the Last Year: Never true  Transportation Needs: No Transportation Needs   Lack of Transportation (Medical): No  Lack of Transportation (Non-Medical): No  Physical Activity: Inactive   Days of Exercise per Week: 0 days   Minutes of Exercise per Session: 0 min  Stress: No Stress Concern Present   Feeling of Stress : Not at all  Social Connections: Moderately Isolated   Frequency of Communication with Friends and Family: More than three times a week   Frequency of Social Gatherings with Friends and Family: More than three times a week   Attends Religious Services: Never   Marine scientist or Organizations: No   Attends Music therapist: Never   Marital Status: Married  Human resources officer Violence: Not At Risk   Fear of Current or Ex-Partner: No   Emotionally Abused: No   Physically Abused: No   Sexually Abused: No    Outpatient Medications Prior to Visit  Medication Sig Dispense Refill   albuterol (VENTOLIN HFA) 108 (90 Base) MCG/ACT inhaler Inhale 1-2 puffs into the lungs every 6 (six) hours as needed for wheezing or shortness of breath. 8 g 5   atorvastatin (LIPITOR) 20 MG tablet TAKE ONE TABLET BY MOUTH ONCE DAILY. 90 tablet 0   carvedilol (COREG) 6.25 MG tablet TAKE (1) TABLET BY MOUTH TWICE DAILY. 60 tablet 0   DULoxetine (CYMBALTA) 30 MG capsule Take 1 capsule (30 mg total) by mouth  daily. 90 capsule 0   gabapentin (NEURONTIN) 100 MG capsule Take 1 capsule (100 mg total) by mouth 2 (two) times daily.     HYDROcodone-acetaminophen (NORCO) 7.5-325 MG tablet Take 1 tablet by mouth every 6 (six) hours as needed for severe pain. 30 tablet 0   hydrOXYzine (ATARAX) 10 MG tablet hydroxyzine HCl 10 mg tablet     levothyroxine (SYNTHROID) 88 MCG tablet TAKE (1) TABLET BY MOUTH EACH MORNING. 90 tablet 0   Multiple Vitamins-Minerals (EYE VITAMINS PO) Take 1 tablet by mouth daily.      ondansetron (ZOFRAN) 4 MG tablet TAKE 1 TABLET BY MOUTH THREE TIMES AS NEEDED FOR NAUSEA. 90 tablet 0   oxyCODONE-acetaminophen (PERCOCET/ROXICET) 5-325 MG tablet oxycodone-acetaminophen 5 mg-325 mg tablet     pantoprazole (PROTONIX) 40 MG tablet Take 1 tablet (40 mg total) by mouth daily. 90 tablet 1   UNABLE TO FIND Wheelchair (small) - OA and physical deconditioning (M19.90, R53.81) 1 each 0   UNABLE TO FIND Hand held shower head with long cord 1 each 0   No facility-administered medications prior to visit.    Allergies  Allergen Reactions   Hydralazine Other (See Comments)    Breathing   Nalbuphine Rash   Paroxetine Itching   Sulfa Antibiotics Other (See Comments)    Childhood reaction.   Venlafaxine Itching    ROS Review of Systems  Constitutional:  Positive for fatigue. Negative for appetite change, chills and fever.  HENT:  Negative for congestion, sinus pressure, sinus pain and sore throat.   Eyes:  Negative for pain and discharge.  Respiratory:  Positive for cough, shortness of breath and wheezing.   Cardiovascular:  Positive for leg swelling. Negative for chest pain and palpitations.  Gastrointestinal:  Negative for abdominal pain, diarrhea, nausea and vomiting.  Endocrine: Negative for polydipsia and polyuria.  Genitourinary:  Negative for dysuria and hematuria.  Musculoskeletal:  Positive for arthralgias and back pain. Negative for neck pain and neck stiffness.  Skin:  Positive  for wound.  Neurological:  Negative for dizziness and weakness.  Psychiatric/Behavioral:  Negative for agitation and behavioral problems.      Objective:  Physical Exam Vitals reviewed.  Constitutional:      General: She is not in acute distress.    Appearance: She is not diaphoretic.     Comments: In wheelchair  HENT:     Head: Normocephalic and atraumatic.     Nose: Nose normal.     Mouth/Throat:     Mouth: Mucous membranes are moist.  Eyes:     General: No scleral icterus.    Extraocular Movements: Extraocular movements intact.  Cardiovascular:     Rate and Rhythm: Normal rate and regular rhythm.     Pulses: Normal pulses.     Heart sounds: Normal heart sounds. No murmur heard. Pulmonary:     Breath sounds: Wheezing (B/l diffuse) present. No rales.  Abdominal:     Palpations: Abdomen is soft.     Tenderness: There is no abdominal tenderness.  Musculoskeletal:        General: Swelling (Right knee, no warmth) present.     Cervical back: Neck supple. No tenderness.     Right lower leg: Edema (2+) present.     Left lower leg: Edema (1+) present.  Skin:    General: Skin is warm.     Findings: Erythema (Senile purpuric lesions (b/l UE)) present.  Neurological:     General: No focal deficit present.     Mental Status: She is alert and oriented to person, place, and time. Mental status is at baseline.     Sensory: No sensory deficit.     Motor: Weakness (3+ in b/l UE and LE) present.     Gait: Gait abnormal.  Psychiatric:        Mood and Affect: Mood normal.        Behavior: Behavior normal.    BP (!) 152/84 (BP Location: Left Arm, Patient Position: Sitting, Cuff Size: Normal)    Pulse 63    Resp 18    Ht $R'5\' 3"'We$  (1.6 m)    SpO2 96%    BMI 20.66 kg/m  Wt Readings from Last 3 Encounters:  03/01/21 116 lb 10 oz (52.9 kg)  11/12/20 116 lb 9.6 oz (52.9 kg)  10/28/20 116 lb 9.6 oz (52.9 kg)    Lab Results  Component Value Date   TSH 8.510 (H) 12/28/2020   Lab  Results  Component Value Date   WBC 21.0 (H) 03/01/2021   HGB 11.6 (L) 03/01/2021   HCT 34.9 (L) 03/01/2021   MCV 87.5 03/01/2021   PLT 461 (H) 03/01/2021   Lab Results  Component Value Date   NA 127 (L) 03/01/2021   K 4.3 03/01/2021   CO2 22 03/01/2021   GLUCOSE 107 (H) 03/01/2021   BUN 33 (H) 03/01/2021   CREATININE 2.58 (H) 03/01/2021   BILITOT 0.3 12/28/2020   ALKPHOS 67 12/28/2020   AST 14 12/28/2020   ALT 7 12/28/2020   PROT 6.5 12/28/2020   ALBUMIN 3.9 12/28/2020   CALCIUM 9.0 03/01/2021   ANIONGAP 11 03/01/2021   EGFR 22 (L) 12/28/2020   Lab Results  Component Value Date   CHOL 164 09/17/2018   Lab Results  Component Value Date   HDL 44 09/17/2018   Lab Results  Component Value Date   LDLCALC 95 09/17/2018   Lab Results  Component Value Date   TRIG 145 09/17/2018   Lab Results  Component Value Date   CHOLHDL 2.9 07/15/2008   Lab Results  Component Value Date   HGBA1C 5.3 06/14/2020      Assessment &  Plan:   Problem List Items Addressed This Visit       Cardiovascular and Mediastinum   Primary hypertension    BP Readings from Last 1 Encounters:  03/10/21 (!) 152/84  Uncontrolled with Coreg 6.25 mg twice daily and lisinopril 10 mg daily due to noncompliance Counseled for compliance with the medications Advised DASH diet and moderate exercise/walking as tolerated Chronic pain and CKD also contributing to high BP Followed by Nephrologist      Relevant Orders   CBC with Differential/Platelet   (HFpEF) heart failure with preserved ejection fraction (HCC)    Chronic leg swelling and dyspnea Needs leg elevation for leg swelling For better breathing, head elevation might be beneficial with hospital bed On diuretics        Respiratory   Chronic obstructive pulmonary disease, unspecified ([place])    Uncontrolled Has been using Albuterol frequently Added Advair as maintenance therapy Educated about use of maintenance therapy and rescue  inhaler      Relevant Medications   fluticasone-salmeterol (ADVAIR) 100-50 MCG/ACT AEPB     Endocrine   Hypothyroidism    Continue Levothyroxine to 88 mcg QD Check TSH, free T4 and T3 Reviewed US thyroid      Relevant Orders   CBC with Differential/Platelet   TSH+T4F+T3Free     Genitourinary   CKD (chronic kidney disease) stage 4, GFR 15-29 ml/min (HCC) - Primary    Follows up with nephrologist Follows up with hematooncologist for evaluation of elevated light chain levels Advised to follow renal diet as advised by nephrologist and dietitian Avoid nephrotoxic agents including NSAIDs Check CMP, serum osm, urine osm, urine  creatinine and sodium      Relevant Orders   CMP14+EGFR   CBC with Differential/Platelet   Urinalysis   Creatinine, Urine   Candidal vaginitis    Likely due to recent antibiotic exposure Fluconazole prescribed      Relevant Medications   fluconazole (DIFLUCAN) 150 MG tablet     Other   Vitamin D deficiency    On vitamin D supplement      Relevant Orders   VITAMIN D 25 Hydroxy (Vit-D Deficiency, Fractures)   Encounter for examination following treatment at hospital    Recent ER visit for CAP/COPD exacerbation Completed azithromycin and Augmentin, now better ER chart reviewed including imaging Complains of perineal itching since taking antibiotic, fluconazole prescribed for candidal vaginitis      Pressure injury of skin of sacral region    Daughter-in-law reports sacral ulcer, slowly improving with OTC cream Frequent repositioning needed Due to her physical deconditioning, her mobility is impaired Needs assistance with position change and ambulation Will try to get hospital bed at home      Other Visit Diagnoses     Hyponatremia       Relevant Orders   Osmolality, urine   Creatinine, Urine   Sodium, urine, random   Osmolality       Meds ordered this encounter  Medications   fluconazole (DIFLUCAN) 150 MG tablet    Sig: Take 1  tablet (150 mg total) by mouth once for 1 dose.    Dispense:  1 tablet    Refill:  0   fluticasone-salmeterol (ADVAIR) 100-50 MCG/ACT AEPB    Sig: Inhale 1 puff into the lungs 2 (two) times daily.    Dispense:  1 each    Refill:  5    Follow-up: Return in about 3 months (around 06/08/2021) for CKD, HTN.    Ilyas Lipsitz K  Posey Pronto, MD

## 2021-03-10 NOTE — Assessment & Plan Note (Signed)
Daughter-in-law reports sacral ulcer, slowly improving with OTC cream Frequent repositioning needed Due to her physical deconditioning, her mobility is impaired Needs assistance with position change and ambulation Will try to get hospital bed at home

## 2021-03-10 NOTE — Assessment & Plan Note (Signed)
Follows up with nephrologist Follows up with hematooncologist for evaluation of elevated light chain levels Advised to follow renal diet as advised by nephrologist and dietitian Avoid nephrotoxic agents including NSAIDs Check CMP, serum osm, urine osm, urine  creatinine and sodium

## 2021-03-10 NOTE — Assessment & Plan Note (Signed)
On vitamin D supplement 

## 2021-03-10 NOTE — Assessment & Plan Note (Signed)
Recent ER visit for CAP/COPD exacerbation Completed azithromycin and Augmentin, now better ER chart reviewed including imaging Complains of perineal itching since taking antibiotic, fluconazole prescribed for candidal vaginitis

## 2021-03-12 LAB — CBC WITH DIFFERENTIAL/PLATELET
Basophils Absolute: 0.1 10*3/uL (ref 0.0–0.2)
Basos: 1 %
EOS (ABSOLUTE): 0.6 10*3/uL — ABNORMAL HIGH (ref 0.0–0.4)
Eos: 6 %
Hematocrit: 34 % (ref 34.0–46.6)
Hemoglobin: 11.2 g/dL (ref 11.1–15.9)
Immature Grans (Abs): 0.1 10*3/uL (ref 0.0–0.1)
Immature Granulocytes: 1 %
Lymphocytes Absolute: 2.8 10*3/uL (ref 0.7–3.1)
Lymphs: 28 %
MCH: 28.1 pg (ref 26.6–33.0)
MCHC: 32.9 g/dL (ref 31.5–35.7)
MCV: 85 fL (ref 79–97)
Monocytes Absolute: 0.6 10*3/uL (ref 0.1–0.9)
Monocytes: 6 %
Neutrophils Absolute: 5.9 10*3/uL (ref 1.4–7.0)
Neutrophils: 58 %
Platelets: 476 10*3/uL — ABNORMAL HIGH (ref 150–450)
RBC: 3.99 x10E6/uL (ref 3.77–5.28)
RDW: 12.9 % (ref 11.7–15.4)
WBC: 10.2 10*3/uL (ref 3.4–10.8)

## 2021-03-12 LAB — CMP14+EGFR
ALT: 7 IU/L (ref 0–32)
AST: 14 IU/L (ref 0–40)
Albumin/Globulin Ratio: 1.1 — ABNORMAL LOW (ref 1.2–2.2)
Albumin: 3.4 g/dL — ABNORMAL LOW (ref 3.6–4.6)
Alkaline Phosphatase: 80 IU/L (ref 44–121)
BUN/Creatinine Ratio: 13 (ref 12–28)
BUN: 28 mg/dL — ABNORMAL HIGH (ref 8–27)
Bilirubin Total: 0.2 mg/dL (ref 0.0–1.2)
CO2: 21 mmol/L (ref 20–29)
Calcium: 9.6 mg/dL (ref 8.7–10.3)
Chloride: 101 mmol/L (ref 96–106)
Creatinine, Ser: 2.19 mg/dL — ABNORMAL HIGH (ref 0.57–1.00)
Globulin, Total: 3.1 g/dL (ref 1.5–4.5)
Glucose: 96 mg/dL (ref 70–99)
Potassium: 5 mmol/L (ref 3.5–5.2)
Sodium: 137 mmol/L (ref 134–144)
Total Protein: 6.5 g/dL (ref 6.0–8.5)
eGFR: 22 mL/min/{1.73_m2} — ABNORMAL LOW (ref >59)

## 2021-03-12 LAB — URINALYSIS
Bilirubin, UA: NEGATIVE
Glucose, UA: NEGATIVE
Ketones, UA: NEGATIVE
Nitrite, UA: POSITIVE — AB
RBC, UA: NEGATIVE
Specific Gravity, UA: 1.019 (ref 1.005–1.030)
Urobilinogen, Ur: 1 mg/dL (ref 0.2–1.0)
pH, UA: 5.5 (ref 5.0–7.5)

## 2021-03-12 LAB — OSMOLALITY: Osmolality Meas: 290 mOsmol/kg (ref 280–301)

## 2021-03-12 LAB — TSH+T4F+T3FREE
Free T4: 1.61 ng/dL (ref 0.82–1.77)
T3, Free: 1.6 pg/mL — ABNORMAL LOW (ref 2.0–4.4)
TSH: 2.43 u[IU]/mL (ref 0.450–4.500)

## 2021-03-12 LAB — SODIUM, URINE, RANDOM: Sodium, Ur: 63 mmol/L

## 2021-03-12 LAB — OSMOLALITY, URINE: Osmolality, Ur: 443 mOsmol/kg

## 2021-03-12 LAB — CREATININE, URINE, RANDOM: Creatinine, Urine: 98.7 mg/dL

## 2021-03-12 LAB — VITAMIN D 25 HYDROXY (VIT D DEFICIENCY, FRACTURES): Vit D, 25-Hydroxy: 38.5 ng/mL (ref 30.0–100.0)

## 2021-03-14 ENCOUNTER — Telehealth: Payer: Self-pay | Admitting: Internal Medicine

## 2021-03-14 DIAGNOSIS — I509 Heart failure, unspecified: Secondary | ICD-10-CM | POA: Diagnosis not present

## 2021-03-14 NOTE — Telephone Encounter (Signed)
Pt called to return call for blood work

## 2021-03-14 NOTE — Telephone Encounter (Signed)
Spoke with pt regarding labs

## 2021-03-16 DIAGNOSIS — M170 Bilateral primary osteoarthritis of knee: Secondary | ICD-10-CM | POA: Diagnosis not present

## 2021-03-16 DIAGNOSIS — R5381 Other malaise: Secondary | ICD-10-CM | POA: Diagnosis not present

## 2021-03-16 DIAGNOSIS — M1990 Unspecified osteoarthritis, unspecified site: Secondary | ICD-10-CM | POA: Diagnosis not present

## 2021-03-22 DIAGNOSIS — M130 Polyarthritis, unspecified: Secondary | ICD-10-CM | POA: Diagnosis not present

## 2021-03-22 DIAGNOSIS — M797 Fibromyalgia: Secondary | ICD-10-CM | POA: Diagnosis not present

## 2021-03-22 DIAGNOSIS — Z79891 Long term (current) use of opiate analgesic: Secondary | ICD-10-CM | POA: Diagnosis not present

## 2021-03-22 DIAGNOSIS — G894 Chronic pain syndrome: Secondary | ICD-10-CM | POA: Diagnosis not present

## 2021-03-30 ENCOUNTER — Ambulatory Visit: Payer: Medicare Other | Admitting: Internal Medicine

## 2021-04-07 ENCOUNTER — Telehealth: Payer: Self-pay

## 2021-04-07 NOTE — Telephone Encounter (Signed)
FYI Patient daughter Blane Ohara called will be dropping off some cap programs forsm Friday, 04/08/2021 and must have back in her hands by no later than Thursday 04/14/2021, patient daughter will pick up forms and turn on that Friday, 04/15/2021.

## 2021-04-07 NOTE — Telephone Encounter (Signed)
Noted please let her know office has 10 business days to fill out forms for pt which is 04-22-21 we will try our best but provider does have 10 business days

## 2021-04-07 NOTE — Telephone Encounter (Signed)
error 

## 2021-04-07 NOTE — Telephone Encounter (Signed)
Patient daughter informed.

## 2021-04-08 ENCOUNTER — Telehealth: Payer: Self-pay

## 2021-04-08 NOTE — Telephone Encounter (Signed)
CAP PROGRAM FORMS  **NEED FORMS COMPLETED BY THURS, 01.19.2023 AND CALL ANN HUNDLEY [phone] TO PICK UP THE FORMS SO SHE CAN TURN IN ON 01.20.2023 TO [place]  Copied Sleeved noted

## 2021-04-12 ENCOUNTER — Other Ambulatory Visit: Payer: Self-pay | Admitting: Family Medicine

## 2021-04-13 ENCOUNTER — Telehealth: Payer: Self-pay | Admitting: Internal Medicine

## 2021-04-13 DIAGNOSIS — Z0279 Encounter for issue of other medical certificate: Secondary | ICD-10-CM

## 2021-04-13 NOTE — Telephone Encounter (Signed)
error 

## 2021-04-13 NOTE — Telephone Encounter (Signed)
PW has been picked up   No charge was shown   Pt is okay with bill if needed.

## 2021-04-16 DIAGNOSIS — M1990 Unspecified osteoarthritis, unspecified site: Secondary | ICD-10-CM | POA: Diagnosis not present

## 2021-04-16 DIAGNOSIS — R5381 Other malaise: Secondary | ICD-10-CM | POA: Diagnosis not present

## 2021-04-16 DIAGNOSIS — M170 Bilateral primary osteoarthritis of knee: Secondary | ICD-10-CM | POA: Diagnosis not present

## 2021-04-18 ENCOUNTER — Other Ambulatory Visit: Payer: Self-pay | Admitting: Student

## 2021-04-18 ENCOUNTER — Other Ambulatory Visit: Payer: Self-pay | Admitting: Internal Medicine

## 2021-04-18 DIAGNOSIS — E039 Hypothyroidism, unspecified: Secondary | ICD-10-CM

## 2021-04-27 DIAGNOSIS — I509 Heart failure, unspecified: Secondary | ICD-10-CM | POA: Diagnosis not present

## 2021-05-09 DIAGNOSIS — G894 Chronic pain syndrome: Secondary | ICD-10-CM | POA: Diagnosis not present

## 2021-05-12 ENCOUNTER — Other Ambulatory Visit: Payer: Self-pay | Admitting: Student

## 2021-05-17 DIAGNOSIS — R5381 Other malaise: Secondary | ICD-10-CM | POA: Diagnosis not present

## 2021-05-17 DIAGNOSIS — M1990 Unspecified osteoarthritis, unspecified site: Secondary | ICD-10-CM | POA: Diagnosis not present

## 2021-05-17 DIAGNOSIS — M170 Bilateral primary osteoarthritis of knee: Secondary | ICD-10-CM | POA: Diagnosis not present

## 2021-05-23 DIAGNOSIS — I129 Hypertensive chronic kidney disease with stage 1 through stage 4 chronic kidney disease, or unspecified chronic kidney disease: Secondary | ICD-10-CM | POA: Diagnosis not present

## 2021-05-23 DIAGNOSIS — N179 Acute kidney failure, unspecified: Secondary | ICD-10-CM | POA: Diagnosis not present

## 2021-05-23 DIAGNOSIS — N2581 Secondary hyperparathyroidism of renal origin: Secondary | ICD-10-CM | POA: Diagnosis not present

## 2021-05-23 DIAGNOSIS — E871 Hypo-osmolality and hyponatremia: Secondary | ICD-10-CM | POA: Diagnosis not present

## 2021-05-23 DIAGNOSIS — R809 Proteinuria, unspecified: Secondary | ICD-10-CM | POA: Diagnosis not present

## 2021-05-23 DIAGNOSIS — E039 Hypothyroidism, unspecified: Secondary | ICD-10-CM | POA: Diagnosis not present

## 2021-05-23 DIAGNOSIS — N184 Chronic kidney disease, stage 4 (severe): Secondary | ICD-10-CM | POA: Diagnosis not present

## 2021-05-31 ENCOUNTER — Other Ambulatory Visit: Payer: Self-pay | Admitting: Internal Medicine

## 2021-06-12 DIAGNOSIS — I509 Heart failure, unspecified: Secondary | ICD-10-CM | POA: Diagnosis not present

## 2021-06-14 ENCOUNTER — Encounter: Payer: Self-pay | Admitting: Internal Medicine

## 2021-06-14 ENCOUNTER — Ambulatory Visit (INDEPENDENT_AMBULATORY_CARE_PROVIDER_SITE_OTHER): Payer: Medicare Other | Admitting: Internal Medicine

## 2021-06-14 ENCOUNTER — Other Ambulatory Visit: Payer: Self-pay

## 2021-06-14 VITALS — BP 132/88 | HR 63 | Resp 18 | Ht 60.0 in | Wt 112.6 lb

## 2021-06-14 DIAGNOSIS — M1990 Unspecified osteoarthritis, unspecified site: Secondary | ICD-10-CM | POA: Diagnosis not present

## 2021-06-14 DIAGNOSIS — I5032 Chronic diastolic (congestive) heart failure: Secondary | ICD-10-CM

## 2021-06-14 DIAGNOSIS — M170 Bilateral primary osteoarthritis of knee: Secondary | ICD-10-CM | POA: Diagnosis not present

## 2021-06-14 DIAGNOSIS — G309 Alzheimer's disease, unspecified: Secondary | ICD-10-CM

## 2021-06-14 DIAGNOSIS — R220 Localized swelling, mass and lump, head: Secondary | ICD-10-CM | POA: Diagnosis not present

## 2021-06-14 DIAGNOSIS — N184 Chronic kidney disease, stage 4 (severe): Secondary | ICD-10-CM | POA: Diagnosis not present

## 2021-06-14 DIAGNOSIS — G4701 Insomnia due to medical condition: Secondary | ICD-10-CM | POA: Diagnosis not present

## 2021-06-14 DIAGNOSIS — F02B18 Dementia in other diseases classified elsewhere, moderate, with other behavioral disturbance: Secondary | ICD-10-CM

## 2021-06-14 DIAGNOSIS — F02B0 Dementia in other diseases classified elsewhere, moderate, without behavioral disturbance, psychotic disturbance, mood disturbance, and anxiety: Secondary | ICD-10-CM | POA: Insufficient documentation

## 2021-06-14 DIAGNOSIS — E039 Hypothyroidism, unspecified: Secondary | ICD-10-CM | POA: Diagnosis not present

## 2021-06-14 DIAGNOSIS — R5381 Other malaise: Secondary | ICD-10-CM | POA: Diagnosis not present

## 2021-06-14 DIAGNOSIS — G894 Chronic pain syndrome: Secondary | ICD-10-CM

## 2021-06-14 MED ORDER — MIRTAZAPINE 7.5 MG PO TABS: 7.5000 mg | ORAL_TABLET | Freq: Every day | ORAL | 2 refills | Status: DC

## 2021-06-14 NOTE — Assessment & Plan Note (Signed)
Follows up with nephrologist ?Follows up with hematooncologist for evaluation of elevated light chain levels ?Advised to follow renal diet as advised by nephrologist and dietitian ?Avoid nephrotoxic agents including NSAIDs ?Last BMP reviewed from nephrology visit ?

## 2021-06-14 NOTE — Assessment & Plan Note (Signed)
Lab Results  ?Component Value Date  ? TSH 2.430 03/10/2021  ? ? ?Continue Levothyroxine to 88 mcg QD ?Reviewed US thyroid ?

## 2021-06-14 NOTE — Assessment & Plan Note (Signed)
Cognitive decline with insomnia and episodes of hallucinations Remeron for insomnia Needs frequent reorientation Dependent for ADLs and IADLs, has good family support 

## 2021-06-14 NOTE — Assessment & Plan Note (Signed)
Added Remeron for insomnia and for weight gain purpose ?Her insomnia could be due to cognitive decline from dementia as well ?

## 2021-06-14 NOTE — Patient Instructions (Signed)
Please start taking Mirtazepine at bedtime for insomnia. ? ?Please continue taking other medications as prescribed. ? ?Please get X-ray of chest done at [facility]. ? ?Please continue to follow low salt diet. ? ?You are being scheduled to get CT head. ?

## 2021-06-14 NOTE — Assessment & Plan Note (Signed)
On Cymbalta 30 mg QD ?On Norco PRN, f/u with Dr Merlene Laughter ?

## 2021-06-14 NOTE — Assessment & Plan Note (Signed)
Chronic leg swelling and dyspnea ?Needs leg elevation for leg swelling ?Not on chlorthalidone currently ?Complains of dyspnea and has decreased breath sounds over lung bases, will check CXR ?

## 2021-06-14 NOTE — Progress Notes (Signed)
? ?Established Patient Office Visit ? ?Subjective:  ?Patient ID: [name], female    DOB: 1936/02/23  Age: 86 y.o. MRN: [id number] ? ?CC:  ?Chief Complaint  ?Patient presents with  ? Follow-up  ?  3 month follow up HTN CKD pt has also not been sleeping good has been having hallucinations and walks around in her sleep   ? ? ?HPI ?[name] is a 86 y.o. female with past medical history of hypertension, CKD stage IV, fibromyalgia, chronic pelvic pain, chronic constipation, hypothyroidism, osteoarthritis of knees and depression who presents for f/u of her chronic medical conditions.  Her daughter in law is present during the visit. ? ?She has been having insomnia and episodes of hallucinations at times, which she does not recall - reported by other family members.  Of note, she was on Remeron in the past for insomnia. ? ?She complains of right-sided frontal headache and noticing a scalp mass (chronic).  She does not recall any recent fall or head injury.  Denies any recent change in vision. ? ?HTN: Her BP is well controlled today.  Followed by nephrology.  She has been taking her medications as prescribed for now.  Denies any chest pain, headache or dizziness currently. ? ?She complains of dyspnea, which is worse recently.  Of note, she was on chlorthalidone for HFpEF, but has been discontinued recently by her nephrologist.  She currently does not have any leg swelling. ? ?CKD stage IV: Followed by nephrology.  She denies any urinary hesitance or resistance.  Denies any dysuria or urinary frequency. ? ? ? ?Past Medical History:  ?Diagnosis Date  ? Anxiety disorder, unspecified   ? Arteriosclerotic cardiovascular disease (ASCVD) 2006  ? Nonobstructive; < 50% lesions on cath 2002; negative stress nuclear study in 10/2004  ? Asthma   ? Chest pain, unspecified   ? Chronic kidney disease, unspecified   ? Chronic obstructive pulmonary disease, unspecified ([place])   ? Chronic pelvic pain in female   ? Colitis due to  Clostridium difficile 1994  ? 1994  ? COPD (chronic obstructive pulmonary disease) ([place])   ? Depression with anxiety   ? Diarrhea, unspecified   ? Disorder of thyroid, unspecified   ? Gastro-esophageal reflux disease without esophagitis   ? Gastroesophageal reflux disease   ? Hyperlipidemia   ? Hyperlipidemia, unspecified   ? Hypertension   ? Hypertensive chronic kidney disease with stage 1 through stage 4 chronic kidney disease, or unspecified chronic kidney disease   ? Hyperthyroidism   ? Hypothyroidism   ? Hypothyroidism, unspecified   ? Kidney disease, chronic, stage III (moderate, EGFR 30-59 ml/min) (H[facility])   ? Lower abdominal pain, unspecified   ? Major depressive disorder, single episode, severe without psychotic features ([place])   ? Major depressive disorder, single episode, unspecified   ? Nausea   ? Other postherpetic nervous system involvement   ? Pelvic and perineal pain   ? Rash and other nonspecific skin eruption   ? S/P colonoscopy 2007  ? few diverticula, otherwise nl  ? S/P colonoscopy 12/13/10  ? rectal, cecal polyp, left-sided diverticulosis, hyperplastic. ?anal fissure? treated empirically  ? S/P endoscopy 2009  ? linear esophageal erosions  ? S/P endoscopy 05/10/10  ? Question island of salmon-colored epithelium in distal esophagus ; no Barrett's.   ? Shingles   ? Tobacco abuse, in remission   ? 55-pack-year consumption; discontinued 08/2010  ? Unspecified asthma, uncomplicated   ? Unspecified osteoarthritis, unspecified site   ?  Urinary tract infection, site not specified   ? ? ?Past Surgical History:  ?Procedure Laterality Date  ? ABDOMINAL HYSTERECTOMY    ? BREAST LUMPECTOMY    ? CATARACT EXTRACTION    ? CHOLECYSTECTOMY  1962  ? COLONOSCOPY  12/13/2010  ? Left-sided diverticulosis.  Cecal polyp, status post hot snare polypectomy/ Diminutive rectal polyp, status post cold biopsy removal tender/painful anal canal, ? occult anal fissure- Not visualized  ? COLONOSCOPY N/A 10/14/2015  ? Diverticulosis  ?  ESOPHAGOGASTRODUODENOSCOPY  05/10/10  ? benign mucosa with mild chronic inflammation.  ? ESOPHAGOGASTRODUODENOSCOPY (EGD) WITH PROPOFOL N/A 02/13/2019  ? Procedure: ESOPHAGOGASTRODUODENOSCOPY (EGD) WITH PROPOFOL;  Surgeon: Daneil Dolin, MD;  Location: [facility];  Service: Endoscopy;  Laterality: N/A;  3:00pm  ? FLEXIBLE SIGMOIDOSCOPY  12/29/2011  ? Procedure: FLEXIBLE SIGMOIDOSCOPY;  Surgeon: Rogene Houston, MD;  Location: [facility];  Service: Endoscopy;  Laterality: N/A;  200-Ann notified pt to be here @ 1:00  ? NISSEN FUNDOPLICATION    ? YAG LASER APPLICATION Left 05/06/4707  ? Procedure: YAG LASER APPLICATION;  Surgeon: Williams Che, MD;  Location: [facility];  Service: Ophthalmology;  Laterality: Left;  ? ? ?Family History  ?Problem Relation Age of Onset  ? Diabetes Mother   ? Alzheimer's disease Mother   ? Cancer Mother   ? Heart disease Father   ? Cancer Sister   ? Heart disease Brother   ?     Also mother, Father, brother and 2 sons  ? Cancer Son   ?     testicular cancer   ? Aneurysm Son   ? Diabetes Son   ? Hypertension Brother   ?     also Sister x2  ? Colon cancer Neg Hx   ? ? ?Social History  ? ?Socioeconomic History  ? Marital status: Married  ?  Spouse name: Not on file  ? Number of children: 4  ? Years of education: Not on file  ? Highest education level: Not on file  ?Occupational History  ? Not on file  ?Tobacco Use  ? Smoking status: Former  ?  Packs/day: 1.00  ?  Years: 55.00  ?  Pack years: 55.00  ?  Types: Cigarettes  ?  Quit date: 09/15/2010  ?  Years since quitting: 10.7  ? Smokeless tobacco: Never  ?Vaping Use  ? Vaping Use: Never used  ?Substance and Sexual Activity  ? Alcohol use: No  ?  Alcohol/week: 0.0 standard drinks  ? Drug use: No  ? Sexual activity: Not Currently  ?Other Topics Concern  ? Not on file  ?Social History Narrative  ? Not on file  ? ?Social Determinants of Health  ? ?Financial Resource Strain: Low Risk   ? Difficulty of Paying Living Expenses: Not hard at all   ?Food Insecurity: No Food Insecurity  ? Worried About Charity fundraiser in the Last Year: Never true  ? Ran Out of Food in the Last Year: Never true  ?Transportation Needs: No Transportation Needs  ? Lack of Transportation (Medical): No  ? Lack of Transportation (Non-Medical): No  ?Physical Activity: Inactive  ? Days of Exercise per Week: 0 days  ? Minutes of Exercise per Session: 0 min  ?Stress: No Stress Concern Present  ? Feeling of Stress : Not at all  ?Social Connections: Moderately Isolated  ? Frequency of Communication with Friends and Family: More than three times a week  ? Frequency of Social Gatherings with  Friends and Family: More than three times a week  ? Attends Religious Services: Never  ? Active Member of Clubs or Organizations: No  ? Attends Archivist Meetings: Never  ? Marital Status: Married  ?Intimate Partner Violence: Not At Risk  ? Fear of Current or Ex-Partner: No  ? Emotionally Abused: No  ? Physically Abused: No  ? Sexually Abused: No  ? ? ?Outpatient Medications Prior to Visit  ?Medication Sig Dispense Refill  ? albuterol (VENTOLIN HFA) 108 (90 Base) MCG/ACT inhaler Inhale 1-2 puffs into the lungs every 6 (six) hours as needed for wheezing or shortness of breath. 8 g 5  ? atorvastatin (LIPITOR) 20 MG tablet TAKE ONE TABLET BY MOUTH ONCE DAILY. 90 tablet 0  ? carvedilol (COREG) 6.25 MG tablet TAKE (1) TABLET BY MOUTH TWICE DAILY. 30 tablet 0  ? chlorthalidone (HYGROTON) 25 MG tablet Take 12.5 mg by mouth daily.    ? DULoxetine (CYMBALTA) 30 MG capsule Take 1 capsule (30 mg total) by mouth daily. 90 capsule 0  ? fluticasone-salmeterol (ADVAIR) 100-50 MCG/ACT AEPB Inhale 1 puff into the lungs 2 (two) times daily. 1 each 5  ? hydrOXYzine (ATARAX) 10 MG tablet hydroxyzine HCl 10 mg tablet    ? levothyroxine (SYNTHROID) 88 MCG tablet TAKE (1) TABLET BY MOUTH EACH MORNING. 90 tablet 0  ? Multiple Vitamins-Minerals (EYE VITAMINS PO) Take 1 tablet by mouth daily.     ? omeprazole  (PRILOSEC) 40 MG capsule TAKE (1) CAPSULE BY MOUTH TWICE DAILY FOR REFLUX. 180 capsule 0  ? ondansetron (ZOFRAN) 4 MG tablet TAKE 1 TABLET BY MOUTH THREE TIMES AS NEEDED FOR NAUSEA. 90 tablet 0  ? oxyCODONE-acetam

## 2021-06-14 NOTE — Assessment & Plan Note (Signed)
Unclear etiology ?Does not recall any recent fall or head injury, but daughter-in-law reports that she was in [place] last year, where she might have had a fall ?Recent change in behavior and headache along with scalp mass, will check CT head without contrast ?

## 2021-06-15 ENCOUNTER — Other Ambulatory Visit: Payer: Self-pay | Admitting: Student

## 2021-07-12 ENCOUNTER — Other Ambulatory Visit: Payer: Self-pay | Admitting: Student

## 2021-07-12 ENCOUNTER — Other Ambulatory Visit: Payer: Self-pay | Admitting: Internal Medicine

## 2021-07-12 DIAGNOSIS — E039 Hypothyroidism, unspecified: Secondary | ICD-10-CM

## 2021-07-12 DIAGNOSIS — K219 Gastro-esophageal reflux disease without esophagitis: Secondary | ICD-10-CM

## 2021-07-13 DIAGNOSIS — I509 Heart failure, unspecified: Secondary | ICD-10-CM | POA: Diagnosis not present

## 2021-07-15 DIAGNOSIS — M1990 Unspecified osteoarthritis, unspecified site: Secondary | ICD-10-CM | POA: Diagnosis not present

## 2021-07-15 DIAGNOSIS — M170 Bilateral primary osteoarthritis of knee: Secondary | ICD-10-CM | POA: Diagnosis not present

## 2021-07-15 DIAGNOSIS — R5381 Other malaise: Secondary | ICD-10-CM | POA: Diagnosis not present

## 2021-07-19 DIAGNOSIS — Z79891 Long term (current) use of opiate analgesic: Secondary | ICD-10-CM | POA: Diagnosis not present

## 2021-07-19 DIAGNOSIS — M130 Polyarthritis, unspecified: Secondary | ICD-10-CM | POA: Diagnosis not present

## 2021-07-19 DIAGNOSIS — G894 Chronic pain syndrome: Secondary | ICD-10-CM | POA: Diagnosis not present

## 2021-07-26 ENCOUNTER — Ambulatory Visit (HOSPITAL_COMMUNITY): Admission: RE | Admit: 2021-07-26 | Payer: Medicare Other | Source: Ambulatory Visit

## 2021-08-01 ENCOUNTER — Emergency Department (HOSPITAL_COMMUNITY)
Admission: EM | Admit: 2021-08-01 | Discharge: 2021-08-01 | Disposition: A | Discharge status: Home / Self Care | Payer: Medicare Other | Attending: Student | Admitting: Student

## 2021-08-01 ENCOUNTER — Telehealth: Payer: Self-pay

## 2021-08-01 ENCOUNTER — Other Ambulatory Visit: Payer: Self-pay

## 2021-08-01 ENCOUNTER — Emergency Department (HOSPITAL_COMMUNITY): Payer: Medicare Other

## 2021-08-01 DIAGNOSIS — Z7951 Long term (current) use of inhaled steroids: Secondary | ICD-10-CM | POA: Diagnosis not present

## 2021-08-01 DIAGNOSIS — Z79899 Other long term (current) drug therapy: Secondary | ICD-10-CM | POA: Diagnosis not present

## 2021-08-01 DIAGNOSIS — R0602 Shortness of breath: Secondary | ICD-10-CM | POA: Diagnosis not present

## 2021-08-01 DIAGNOSIS — I10 Essential (primary) hypertension: Secondary | ICD-10-CM | POA: Diagnosis not present

## 2021-08-01 DIAGNOSIS — J441 Chronic obstructive pulmonary disease with (acute) exacerbation: Secondary | ICD-10-CM | POA: Diagnosis not present

## 2021-08-01 DIAGNOSIS — I5032 Chronic diastolic (congestive) heart failure: Secondary | ICD-10-CM | POA: Insufficient documentation

## 2021-08-01 DIAGNOSIS — R6889 Other general symptoms and signs: Secondary | ICD-10-CM | POA: Diagnosis not present

## 2021-08-01 DIAGNOSIS — Z743 Need for continuous supervision: Secondary | ICD-10-CM | POA: Diagnosis not present

## 2021-08-01 DIAGNOSIS — J90 Pleural effusion, not elsewhere classified: Secondary | ICD-10-CM | POA: Diagnosis not present

## 2021-08-01 DIAGNOSIS — I110 Hypertensive heart disease with heart failure: Secondary | ICD-10-CM | POA: Diagnosis not present

## 2021-08-01 DIAGNOSIS — R069 Unspecified abnormalities of breathing: Secondary | ICD-10-CM | POA: Diagnosis not present

## 2021-08-01 LAB — BASIC METABOLIC PANEL
Anion gap: 9 (ref 5–15)
BUN: 29 mg/dL — ABNORMAL HIGH (ref 8–23)
CO2: 24 mmol/L (ref 22–32)
Calcium: 9.6 mg/dL (ref 8.9–10.3)
Chloride: 103 mmol/L (ref 98–111)
Creatinine, Ser: 2.31 mg/dL — ABNORMAL HIGH (ref 0.44–1.00)
GFR, Estimated: 20 mL/min — ABNORMAL LOW (ref >60)
Glucose, Bld: 106 mg/dL — ABNORMAL HIGH (ref 70–99)
Potassium: 4.3 mmol/L (ref 3.5–5.1)
Sodium: 136 mmol/L (ref 135–145)

## 2021-08-01 LAB — CBC WITH DIFFERENTIAL/PLATELET
Abs Immature Granulocytes: 0.05 10*3/uL (ref 0.00–0.07)
Basophils Absolute: 0.1 10*3/uL (ref 0.0–0.1)
Basophils Relative: 1 %
Eosinophils Absolute: 0.5 10*3/uL (ref 0.0–0.5)
Eosinophils Relative: 5 %
HCT: 32.7 % — ABNORMAL LOW (ref 36.0–46.0)
Hemoglobin: 10.8 g/dL — ABNORMAL LOW (ref 12.0–15.0)
Immature Granulocytes: 0 %
Lymphocytes Relative: 17 %
Lymphs Abs: 2 10*3/uL (ref 0.7–4.0)
MCH: 29.5 pg (ref 26.0–34.0)
MCHC: 33 g/dL (ref 30.0–36.0)
MCV: 89.3 fL (ref 80.0–100.0)
Monocytes Absolute: 0.9 10*3/uL (ref 0.1–1.0)
Monocytes Relative: 7 %
Neutro Abs: 8.5 10*3/uL — ABNORMAL HIGH (ref 1.7–7.7)
Neutrophils Relative %: 70 %
Platelets: 284 10*3/uL (ref 150–400)
RBC: 3.66 MIL/uL — ABNORMAL LOW (ref 3.87–5.11)
RDW: 14.4 % (ref 11.5–15.5)
WBC: 12 10*3/uL — ABNORMAL HIGH (ref 4.0–10.5)
nRBC: 0 % (ref 0.0–0.2)

## 2021-08-01 LAB — BRAIN NATRIURETIC PEPTIDE: B Natriuretic Peptide: 94 pg/mL (ref 0.0–100.0)

## 2021-08-01 MED ORDER — IPRATROPIUM-ALBUTEROL 0.5-2.5 (3) MG/3ML IN SOLN
6.0000 mL | Freq: Once | RESPIRATORY_TRACT | Status: AC
Start: 2021-08-01 — End: 2021-08-01
  Administered 2021-08-01: 6 mL via RESPIRATORY_TRACT
  Filled 2021-08-01: qty 3

## 2021-08-01 MED ORDER — DEXAMETHASONE 4 MG PO TABS
6.0000 mg | ORAL_TABLET | Freq: Once | ORAL | Status: AC
  Administered 2021-08-01: 6 mg via ORAL
  Filled 2021-08-01: qty 2

## 2021-08-01 MED ORDER — ONDANSETRON 8 MG PO TBDP
8.0000 mg | ORAL_TABLET | Freq: Once | ORAL | Status: AC
  Administered 2021-08-01: 8 mg via ORAL
  Filled 2021-08-01: qty 1

## 2021-08-01 MED ORDER — OXYCODONE HCL 5 MG PO TABS
5.0000 mg | ORAL_TABLET | Freq: Once | ORAL | Status: AC
  Administered 2021-08-01: 5 mg via ORAL
  Filled 2021-08-01: qty 1

## 2021-08-01 MED ORDER — IPRATROPIUM-ALBUTEROL 0.5-2.5 (3) MG/3ML IN SOLN
3.0000 mL | Freq: Once | RESPIRATORY_TRACT | Status: AC
Start: 2021-08-01 — End: 2021-08-01
  Administered 2021-08-01: 3 mL via RESPIRATORY_TRACT
  Filled 2021-08-01: qty 3

## 2021-08-01 NOTE — Discharge Instructions (Signed)
Your work-up today was reassuring, please follow-up with your primary care later this week for reevaluation given you were seen here in the ED. you are treated with steroids, this should be in your system for 3 days after your discharge.  Continue taking your home hypertension medicine. ?

## 2021-08-01 NOTE — Telephone Encounter (Signed)
Patient did not show up for her CT scan needs a new order. ?

## 2021-08-01 NOTE — ED Triage Notes (Signed)
Pt arrived via [facility] from home with c/o hypertension and a headache she has had for 2 months. Initial BP for EMS was 208/120. Pt c/o SOB with exertion  ?

## 2021-08-01 NOTE — ED Provider Notes (Signed)
?[facility] ?Provider Note ? ? ?CSN: [id number] ?Arrival date & time: 08/01/21  1633 ? ?  ? ?History ? ?Chief Complaint  ?Patient presents with  ? Hypertension  ? Shortness of Breath  ? ? ?[name] is a 86 y.o. female. ? ? ?Hypertension ? ?Patient with medical history notable for kidney disease, chronic diastolic heart failure, COPD, GERD, hyperlipidemia presents today due to shortness of breath.  Patient states she has been short of breath for some time, states she has been having worsening dyspnea on exertion with any ambulation.  Denies any chest pain, she does states she has been having an intermittent headache for the last few weeks but denies any headache currently.  No vision changes, no lateralized weakness.  Has taken all of her blood pressure medicine at home as her daughter-in-law gives it to her but is concerned her blood pressure was elevated today.  Denies missing any doses of her blood pressure medicine. ?  ? ?Home Medications ?Prior to Admission medications   ?Medication Sig Start Date End Date Taking? Authorizing Provider  ?albuterol (VENTOLIN HFA) 108 (90 Base) MCG/ACT inhaler Inhale 1-2 puffs into the lungs every 6 (six) hours as needed for wheezing or shortness of breath. 12/28/20   Lindell Spar, MD  ?atorvastatin (LIPITOR) 20 MG tablet TAKE ONE TABLET BY MOUTH ONCE DAILY. 04/12/21   Fayrene Helper, MD  ?carvedilol (COREG) 6.25 MG tablet TAKE (1) TABLET BY MOUTH TWICE DAILY. 07/12/21   Strader, Fransisco Hertz, PA-C  ?chlorthalidone (HYGROTON) 25 MG tablet Take 12.5 mg by mouth daily. 05/23/21   [provider]  ?DULoxetine (CYMBALTA) 30 MG capsule Take 1 capsule (30 mg total) by mouth daily. 12/22/19 11/26/21  Lindell Spar, MD  ?fluticasone-salmeterol (ADVAIR) 100-50 MCG/ACT AEPB Inhale 1 puff into the lungs 2 (two) times daily. 03/10/21   Lindell Spar, MD  ?hydrOXYzine (ATARAX) 10 MG tablet hydroxyzine HCl 10 mg tablet    [provider]   ?levothyroxine (SYNTHROID) 88 MCG tablet TAKE (1) TABLET BY MOUTH EACH MORNING. 07/12/21   Lindell Spar, MD  ?mirtazapine (REMERON) 7.5 MG tablet Take 1 tablet (7.5 mg total) by mouth at bedtime. 06/14/21   Lindell Spar, MD  ?Multiple Vitamins-Minerals (EYE VITAMINS PO) Take 1 tablet by mouth daily.     [provider]  ?omeprazole (PRILOSEC) 40 MG capsule TAKE (1) CAPSULE BY MOUTH TWICE DAILY FOR REFLUX. 05/31/21   Lindell Spar, MD  ?ondansetron (ZOFRAN) 4 MG tablet TAKE 1 TABLET BY MOUTH THREE TIMES AS NEEDED FOR NAUSEA. 03/09/21   Lindell Spar, MD  ?oxyCODONE-acetaminophen (PERCOCET/ROXICET) 5-325 MG tablet oxycodone-acetaminophen 5 mg-325 mg tablet    [provider]  ?pantoprazole (PROTONIX) 40 MG tablet TAKE (1) TABLET BY MOUTH ONCE DAILY. 07/12/21   Lindell Spar, MD  ?Karen Kays TO FIND Wheelchair (small) - OA and physical deconditioning (M19.90, R53.81) 12/28/20   Lindell Spar, MD  ?Karen Kays TO FIND Hand held shower head with long cord 01/25/21   Lindell Spar, MD  ?   ? ?Allergies    ?Hydralazine, Nalbuphine, Paroxetine, Sulfa antibiotics, and Venlafaxine   ? ?Review of Systems   ?Review of Systems ? ?Physical Exam ?Updated Vital Signs ?BP (!) 188/75   Pulse 70   Temp 98.7 ?F (37.1 ?C) (Oral)   Resp 18   Ht 5' (1.524 m)   Wt 50.8 kg   SpO2 98%   BMI 21.87 kg/m?  ?Physical Exam ?  Vitals and nursing note reviewed. Exam conducted with a chaperone present.  ?Constitutional:   ?   Appearance: Normal appearance.  ?HENT:  ?   Head: Normocephalic and atraumatic.  ?Eyes:  ?   General: No scleral icterus.    ?   Right eye: No discharge.     ?   Left eye: No discharge.  ?   Extraocular Movements: Extraocular movements intact.  ?   Pupils: Pupils are equal, round, and reactive to light.  ?Cardiovascular:  ?   Rate and Rhythm: Normal rate and regular rhythm.  ?   Pulses: Normal pulses.  ?   Heart sounds: Normal heart sounds. No murmur heard. ?  No friction rub. No gallop.  ?Pulmonary:  ?    Effort: Pulmonary effort is normal. Tachypnea present. No respiratory distress.  ?   Breath sounds: Wheezing and rhonchi present. No decreased breath sounds.  ?Abdominal:  ?   General: Abdomen is flat. Bowel sounds are normal. There is no distension.  ?   Palpations: Abdomen is soft.  ?   Tenderness: There is no abdominal tenderness.  ?Skin: ?   General: Skin is warm and dry.  ?   Coloration: Skin is not jaundiced.  ?Neurological:  ?   Mental Status: She is alert. Mental status is at baseline.  ?   Coordination: Coordination normal.  ? ? ?ED Results / Procedures / Treatments   ?Labs ?(all labs ordered are listed, but only abnormal results are displayed) ?Labs Reviewed  ?BASIC METABOLIC PANEL - Abnormal; Notable for the following components:  ?    Result Value  ? Glucose, Bld 106 (*)   ? BUN 29 (*)   ? Creatinine, Ser 2.31 (*)   ? GFR, Estimated 20 (*)   ? All other components within normal limits  ?CBC WITH DIFFERENTIAL/PLATELET - Abnormal; Notable for the following components:  ? WBC 12.0 (*)   ? RBC 3.66 (*)   ? Hemoglobin 10.8 (*)   ? HCT 32.7 (*)   ? Neutro Abs 8.5 (*)   ? All other components within normal limits  ?BRAIN NATRIURETIC PEPTIDE  ? ? ?EKG ?None ? ?Radiology ?DG Chest Portable 1 View ? ?Result Date: 08/01/2021 ?CLINICAL DATA:  Shortness of breath EXAM: PORTABLE CHEST 1 VIEW COMPARISON:  Previous studies including the examination of 03/01/2021 FINDINGS: Transverse diameter of heart is slightly increased. There is prominence of interstitial markings in both lungs there is no focal pulmonary consolidation. There is interval clearing of small right pleural effusion. Deformities in multiple right ribs suggest old healed fractures. IMPRESSION: Cardiomegaly. Increased interstitial markings in both lungs suggest chronic interstitial lung disease with scarring. There are no new infiltrates or signs of alveolar pulmonary edema. Electronically Signed   By: Elmer Picker M.D.   On: 08/01/2021 17:24    ? ?Procedures ?Procedures  ? ? ?Medications Ordered in ED ?Medications  ?ipratropium-albuterol (DUONEB) 0.5-2.5 (3) MG/3ML nebulizer solution 3 mL (3 mLs Nebulization Given 08/01/21 1710)  ?ipratropium-albuterol (DUONEB) 0.5-2.5 (3) MG/3ML nebulizer solution 6 mL (6 mLs Nebulization Given 08/01/21 1819)  ?dexamethasone (DECADRON) tablet 6 mg (6 mg Oral Given 08/01/21 1845)  ?oxyCODONE (Oxy IR/ROXICODONE) immediate release tablet 5 mg (5 mg Oral Given 08/01/21 1845)  ?ondansetron (ZOFRAN-ODT) disintegrating tablet 8 mg (8 mg Oral Given 08/01/21 1845)  ? ? ?ED Course/ Medical Decision Making/ A&P ?  ?                        ?  Medical Decision Making ?Amount and/or Complexity of Data Reviewed ?Labs: ordered. ?Radiology: ordered. ? ?Risk ?Prescription drug management. ? ? ?Patient presents due to shortness of breath and concerned about her blood pressure.  Differential diagnosis includes but is not limited to AKI, endorgan damage, CHF, COPD exacerbation, hypertensive urgency, pneumonia, pneumothorax. ? ?Patient's son is at bedside providing independent history.  I also reviewed external records, patient has a known history of COPD. ? ?Physical exam is notable for wheezing and rhonchi, no hypoxia or tachycardia.  She is not tachypneic on examination.  ? ?I ordered, viewed and interpreted laboratory work-up.  Patient has a slight leukocytosis of 12, hemoglobin is stable at 10.8 which is her chronic anemia.  She does have an elevated creatinine at 2.31 which per chart review is roughly her baseline.  She is not on dialysis, she is being followed for this by nephrology. ? ?Chest x-ray shows interstitial lung disease but no underlying pneumonia or pleural effusions.  EKG showed sinus rhythm per my interpretation without any ischemic findings. ? ?Considered admission for additional work-up but patient feels significantly better on reevaluation.  Her lungs are clear to auscultation. ? ?I ordered the patient 2 DuoNebs and Decadron here  in the ED.  Also gave her home pain medicine for her arthritis.  I reviewed home medications and made changes where I felt indicated. ? ?Do not feel patient needs any additional work-up at this time in the emergency department

## 2021-08-01 NOTE — Telephone Encounter (Signed)
Order should be fine just give her number to radiology to reschedule  ?

## 2021-08-02 ENCOUNTER — Other Ambulatory Visit: Payer: Self-pay | Admitting: Student

## 2021-08-02 ENCOUNTER — Other Ambulatory Visit: Payer: Self-pay | Admitting: Internal Medicine

## 2021-08-02 DIAGNOSIS — G4701 Insomnia due to medical condition: Secondary | ICD-10-CM

## 2021-08-02 NOTE — Telephone Encounter (Signed)
Called [name] left voicemail with the phone number to call can reschedule. ?

## 2021-08-08 DIAGNOSIS — I129 Hypertensive chronic kidney disease with stage 1 through stage 4 chronic kidney disease, or unspecified chronic kidney disease: Secondary | ICD-10-CM | POA: Diagnosis not present

## 2021-08-08 DIAGNOSIS — N184 Chronic kidney disease, stage 4 (severe): Secondary | ICD-10-CM | POA: Diagnosis not present

## 2021-08-08 DIAGNOSIS — N179 Acute kidney failure, unspecified: Secondary | ICD-10-CM | POA: Diagnosis not present

## 2021-08-08 DIAGNOSIS — N2581 Secondary hyperparathyroidism of renal origin: Secondary | ICD-10-CM | POA: Diagnosis not present

## 2021-08-08 DIAGNOSIS — R809 Proteinuria, unspecified: Secondary | ICD-10-CM | POA: Diagnosis not present

## 2021-08-08 DIAGNOSIS — E039 Hypothyroidism, unspecified: Secondary | ICD-10-CM | POA: Diagnosis not present

## 2021-08-08 DIAGNOSIS — E871 Hypo-osmolality and hyponatremia: Secondary | ICD-10-CM | POA: Diagnosis not present

## 2021-08-11 ENCOUNTER — Encounter: Payer: Self-pay | Admitting: Internal Medicine

## 2021-08-11 ENCOUNTER — Ambulatory Visit (INDEPENDENT_AMBULATORY_CARE_PROVIDER_SITE_OTHER): Payer: Medicare Other | Admitting: Internal Medicine

## 2021-08-11 VITALS — BP 122/70 | HR 62 | Resp 16 | Ht 60.0 in | Wt 103.0 lb

## 2021-08-11 DIAGNOSIS — N184 Chronic kidney disease, stage 4 (severe): Secondary | ICD-10-CM

## 2021-08-11 DIAGNOSIS — E039 Hypothyroidism, unspecified: Secondary | ICD-10-CM

## 2021-08-11 DIAGNOSIS — I10 Essential (primary) hypertension: Secondary | ICD-10-CM

## 2021-08-11 DIAGNOSIS — J439 Emphysema, unspecified: Secondary | ICD-10-CM | POA: Diagnosis not present

## 2021-08-11 DIAGNOSIS — I5032 Chronic diastolic (congestive) heart failure: Secondary | ICD-10-CM

## 2021-08-11 MED ORDER — IPRATROPIUM-ALBUTEROL 0.5-2.5 (3) MG/3ML IN SOLN: 3.0000 mL | Freq: Four times a day (QID) | RESPIRATORY_TRACT | 2 refills | Status: DC | PRN

## 2021-08-11 MED ORDER — BREZTRI AEROSPHERE 160-9-4.8 MCG/ACT IN AERO: 2.0000 | INHALATION_SPRAY | Freq: Two times a day (BID) | RESPIRATORY_TRACT | 11 refills | Status: DC

## 2021-08-11 NOTE — Patient Instructions (Signed)
Please start using Breztri twice daily instead of Advair.  Please use DuoNeb nebulizer as needed for shortness of breath or wheezing.

## 2021-08-11 NOTE — Assessment & Plan Note (Signed)
Chronic leg swelling and dyspnea Needs leg elevation for leg swelling On chlorthalidone currently

## 2021-08-11 NOTE — Progress Notes (Signed)
Established Patient Office Visit  Subjective:  Patient ID: [name], female    DOB: 1936/01/19  Age: 86 y.o. MRN: [id number]  CC:  Chief Complaint  Patient presents with   Follow-up    Pt has been getting sob for awhile would like nebulizer machine called in so she doesn't have to go back to ER     HPI [name] is a 86 y.o. female with past medical history of hypertension, CKD stage IV, fibromyalgia, chronic pelvic pain, chronic constipation, hypothyroidism, osteoarthritis of knees and depression who presents for f/u of her chronic medical conditions.  COPD exacerbation and CAP, recent ER visit: She went to ER recently with cough and dyspnea and was treated with IV Decadron and Duonebs. She still has to use albuterol inhaler multiple times for dyspnea or wheezing. She is on Advair currently, but does not like to use it.  She denies any fever, chills, chest pain or palpitations currently.  HTN: Her BP is well-controlled. Followed by nephrology.  Denies any chest pain, headache or dizziness currently.  CKD stage IV: Followed by nephrology.  She complains of mild dysuria recently.  Denies any fever, chills or hematuria currently.  She is due for urine testing for CKD and hyponatremia.  Hypothyroidism: Has been taking levothyroxine.  Past Medical History:  Diagnosis Date   Anxiety disorder, unspecified    Arteriosclerotic cardiovascular disease (ASCVD) 2006   Nonobstructive; < 50% lesions on cath 2002; negative stress nuclear study in 10/2004   Asthma    Chest pain, unspecified    Chronic kidney disease, unspecified    Chronic obstructive pulmonary disease, unspecified (HCC)    Chronic pelvic pain in female    Colitis due to Clostridium difficile 1994   1994   COPD (chronic obstructive pulmonary disease) (HCC)    Depression with anxiety    Diarrhea, unspecified    Disorder of thyroid, unspecified    Gastro-esophageal reflux disease without esophagitis     Gastroesophageal reflux disease    Hyperlipidemia    Hyperlipidemia, unspecified    Hypertension    Hypertensive chronic kidney disease with stage 1 through stage 4 chronic kidney disease, or unspecified chronic kidney disease    Hyperthyroidism    Hypothyroidism    Hypothyroidism, unspecified    Kidney disease, chronic, stage III (moderate, EGFR 30-59 ml/min) (HCC)    Lower abdominal pain, unspecified    Major depressive disorder, single episode, severe without psychotic features (HCC)    Major depressive disorder, single episode, unspecified    Nausea    Other postherpetic nervous system involvement    Pelvic and perineal pain    Rash and other nonspecific skin eruption    S/P colonoscopy 2007   few diverticula, otherwise nl   S/P colonoscopy 12/13/10   rectal, cecal polyp, left-sided diverticulosis, hyperplastic. ?anal fissure? treated empirically   S/P endoscopy 2009   linear esophageal erosions   S/P endoscopy 05/10/10   Question island of salmon-colored epithelium in distal esophagus ; no Barrett's.    Shingles    Tobacco abuse, in remission    55-pack-year consumption; discontinued 08/2010   Unspecified asthma, uncomplicated    Unspecified osteoarthritis, unspecified site    Urinary tract infection, site not specified     Past Surgical History:  Procedure Laterality Date   ABDOMINAL HYSTERECTOMY     BREAST LUMPECTOMY     CATARACT EXTRACTION     CHOLECYSTECTOMY  1962   COLONOSCOPY  12/13/2010   Left-sided diverticulosis.  Cecal polyp, status post hot snare polypectomy/ Diminutive rectal polyp, status post cold biopsy removal tender/painful anal canal, ? occult anal fissure- Not visualized   COLONOSCOPY N/A 10/14/2015   Diverticulosis   ESOPHAGOGASTRODUODENOSCOPY  05/10/10   benign mucosa with mild chronic inflammation.   ESOPHAGOGASTRODUODENOSCOPY (EGD) WITH PROPOFOL N/A 02/13/2019   Procedure: ESOPHAGOGASTRODUODENOSCOPY (EGD) WITH PROPOFOL;  Surgeon: Daneil Dolin, MD;   Location: [facility];  Service: Endoscopy;  Laterality: N/A;  3:00pm   FLEXIBLE SIGMOIDOSCOPY  12/29/2011   Procedure: FLEXIBLE SIGMOIDOSCOPY;  Surgeon: Rogene Houston, MD;  Location: [facility];  Service: Endoscopy;  Laterality: N/A;  200-Ann notified pt to be here @ 0:09   NISSEN FUNDOPLICATION     YAG LASER APPLICATION Left 2/33/0076   Procedure: YAG LASER APPLICATION;  Surgeon: Williams Che, MD;  Location: [facility];  Service: Ophthalmology;  Laterality: Left;    Family History  Problem Relation Age of Onset   Diabetes Mother    Alzheimer's disease Mother    Cancer Mother    Heart disease Father    Cancer Sister    Heart disease Brother        Also mother, Father, brother and 2 sons   Cancer Son        testicular cancer    Aneurysm Son    Diabetes Son    Hypertension Brother        also Sister x2   Colon cancer Neg Hx     Social History   Socioeconomic History   Marital status: Married    Spouse name: Not on file   Number of children: 4   Years of education: Not on file   Highest education level: Not on file  Occupational History   Not on file  Tobacco Use   Smoking status: Former    Packs/day: 1.00    Years: 55.00    Pack years: 55.00    Types: Cigarettes    Quit date: 09/15/2010    Years since quitting: 10.9   Smokeless tobacco: Never  Vaping Use   Vaping Use: Never used  Substance and Sexual Activity   Alcohol use: No    Alcohol/week: 0.0 standard drinks   Drug use: No   Sexual activity: Not Currently  Other Topics Concern   Not on file  Social History Narrative   Not on file   Social Determinants of Health   Financial Resource Strain: Low Risk    Difficulty of Paying Living Expenses: Not hard at all  Food Insecurity: No Food Insecurity   Worried About Charity fundraiser in the Last Year: Never true   [place] in the Last Year: Never true  Transportation Needs: No Transportation Needs   Lack of Transportation (Medical): No   Lack  of Transportation (Non-Medical): No  Physical Activity: Inactive   Days of Exercise per Week: 0 days   Minutes of Exercise per Session: 0 min  Stress: No Stress Concern Present   Feeling of Stress : Not at all  Social Connections: Moderately Isolated   Frequency of Communication with Friends and Family: More than three times a week   Frequency of Social Gatherings with Friends and Family: More than three times a week   Attends Religious Services: Never   Marine scientist or Organizations: No   Attends Archivist Meetings: Never   Marital Status: Married  Human resources officer Violence: Not At Risk   Fear of Current or  Ex-Partner: No   Emotionally Abused: No   Physically Abused: No   Sexually Abused: No    Outpatient Medications Prior to Visit  Medication Sig Dispense Refill   albuterol (VENTOLIN HFA) 108 (90 Base) MCG/ACT inhaler Inhale 1-2 puffs into the lungs every 6 (six) hours as needed for wheezing or shortness of breath. 8 g 5   atorvastatin (LIPITOR) 20 MG tablet TAKE ONE TABLET BY MOUTH ONCE DAILY. 90 tablet 0   carvedilol (COREG) 6.25 MG tablet TAKE (1) TABLET BY MOUTH TWICE DAILY. 30 tablet 6   chlorthalidone (HYGROTON) 25 MG tablet Take 12.5 mg by mouth daily.     DULoxetine (CYMBALTA) 30 MG capsule Take 1 capsule (30 mg total) by mouth daily. 90 capsule 0   hydrOXYzine (ATARAX) 10 MG tablet hydroxyzine HCl 10 mg tablet     levothyroxine (SYNTHROID) 88 MCG tablet TAKE (1) TABLET BY MOUTH EACH MORNING. 90 tablet 0   mirtazapine (REMERON) 7.5 MG tablet TAKE (1) TABLET BY MOUTH AT BEDTIME. 30 tablet 5   Multiple Vitamins-Minerals (EYE VITAMINS PO) Take 1 tablet by mouth daily.      omeprazole (PRILOSEC) 40 MG capsule TAKE (1) CAPSULE BY MOUTH TWICE DAILY FOR REFLUX. 180 capsule 0   ondansetron (ZOFRAN) 4 MG tablet TAKE 1 TABLET BY MOUTH THREE TIMES AS NEEDED FOR NAUSEA. 90 tablet 0   oxyCODONE-acetaminophen (PERCOCET/ROXICET) 5-325 MG tablet oxycodone-acetaminophen  5 mg-325 mg tablet     pantoprazole (PROTONIX) 40 MG tablet TAKE (1) TABLET BY MOUTH ONCE DAILY. 90 tablet 0   UNABLE TO FIND Wheelchair (small) - OA and physical deconditioning (M19.90, R53.81) 1 each 0   UNABLE TO FIND Hand held shower head with long cord 1 each 0   fluticasone-salmeterol (ADVAIR) 100-50 MCG/ACT AEPB Inhale 1 puff into the lungs 2 (two) times daily. 1 each 5   No facility-administered medications prior to visit.    Allergies  Allergen Reactions   Hydralazine Other (See Comments)    Breathing   Nalbuphine Rash   Paroxetine Itching   Sulfa Antibiotics Other (See Comments)    Childhood reaction.   Venlafaxine Itching    ROS Review of Systems  Constitutional:  Positive for fatigue. Negative for appetite change, chills and fever.  HENT:  Negative for congestion, sinus pressure, sinus pain and sore throat.   Eyes:  Negative for pain and discharge.  Respiratory:  Positive for shortness of breath and wheezing. Negative for cough.   Cardiovascular:  Positive for leg swelling. Negative for chest pain and palpitations.  Gastrointestinal:  Negative for abdominal pain, diarrhea, nausea and vomiting.  Endocrine: Negative for polydipsia and polyuria.  Genitourinary:  Negative for dysuria and hematuria.  Musculoskeletal:  Positive for arthralgias and back pain. Negative for neck pain and neck stiffness.  Skin:  Negative for rash.  Neurological:  Negative for dizziness and weakness.  Psychiatric/Behavioral:  Positive for hallucinations and sleep disturbance. Negative for agitation and behavioral problems.      Objective:    Physical Exam Vitals reviewed.  Constitutional:      General: She is not in acute distress.    Appearance: She is not diaphoretic.     Comments: In wheelchair  HENT:     Head: Normocephalic and atraumatic.     Nose: Nose normal.     Mouth/Throat:     Mouth: Mucous membranes are moist.  Eyes:     General: No scleral icterus.    Extraocular  Movements: Extraocular movements intact.  Cardiovascular:  Rate and Rhythm: Normal rate and regular rhythm.     Pulses: Normal pulses.     Heart sounds: Normal heart sounds. No murmur heard. Pulmonary:     Breath sounds: Wheezing (Mild, diffuse) present. No rales.  Abdominal:     Palpations: Abdomen is soft.     Tenderness: There is no abdominal tenderness.  Musculoskeletal:        General: Swelling (Right knee, no warmth) present.     Cervical back: Neck supple. No tenderness.     Right lower leg: No edema.     Left lower leg: No edema.  Skin:    General: Skin is warm.     Findings: Erythema (Senile purpuric lesions (b/l UE)) present.  Neurological:     General: No focal deficit present.     Mental Status: She is alert and oriented to person, place, and time. Mental status is at baseline.     Sensory: No sensory deficit.     Motor: Weakness (3+ in b/l UE and LE) present.     Gait: Gait abnormal.  Psychiatric:        Mood and Affect: Mood normal.        Behavior: Behavior normal.    BP 122/70 (BP Location: Right Arm, Patient Position: Sitting, Cuff Size: Normal)   Pulse 62   Resp 16   Ht 5' (1.524 m)   Wt 103 lb (46.7 kg)   SpO2 96%   BMI 20.12 kg/m  Wt Readings from Last 3 Encounters:  08/11/21 103 lb (46.7 kg)  08/01/21 112 lb (50.8 kg)  06/14/21 112 lb 9.6 oz (51.1 kg)    Lab Results  Component Value Date   TSH 2.430 03/10/2021   Lab Results  Component Value Date   WBC 12.0 (H) 08/01/2021   HGB 10.8 (L) 08/01/2021   HCT 32.7 (L) 08/01/2021   MCV 89.3 08/01/2021   PLT 284 08/01/2021   Lab Results  Component Value Date   NA 136 08/01/2021   K 4.3 08/01/2021   CO2 24 08/01/2021   GLUCOSE 106 (H) 08/01/2021   BUN 29 (H) 08/01/2021   CREATININE 2.31 (H) 08/01/2021   BILITOT 0.2 03/10/2021   ALKPHOS 80 03/10/2021   AST 14 03/10/2021   ALT 7 03/10/2021   PROT 6.5 03/10/2021   ALBUMIN 3.4 (L) 03/10/2021   CALCIUM 9.6 08/01/2021   ANIONGAP 9  08/01/2021   EGFR 22 (L) 03/10/2021   Lab Results  Component Value Date   CHOL 164 09/17/2018   Lab Results  Component Value Date   HDL 44 09/17/2018   Lab Results  Component Value Date   LDLCALC 95 09/17/2018   Lab Results  Component Value Date   TRIG 145 09/17/2018   Lab Results  Component Value Date   CHOLHDL 2.9 07/15/2008   Lab Results  Component Value Date   HGBA1C 5.3 06/14/2020      Assessment & Plan:   Problem List Items Addressed This Visit       Cardiovascular and Mediastinum   Primary hypertension    BP Readings from Last 1 Encounters:  08/11/21 122/70  Well-controlled with Coreg 6.25 mg twice daily and Chlorthalidone 12.5 mg QD Counseled for compliance with the medications Advised DASH diet and moderate exercise/walking as tolerated Chronic pain and CKD also contributing to high BP Followed by Nephrologist      (HFpEF) heart failure with preserved ejection fraction (HCC)    Chronic leg swelling and dyspnea Needs  leg elevation for leg swelling On chlorthalidone currently         Respiratory   Chronic obstructive pulmonary disease, unspecified ([place]) - Primary    Uncontrolled Has been using Albuterol frequently Had added Advair as maintenance therapy, but does not like to use it Switched to [place] due to uncontrolled COPD Prescribed Duoneb for better compliance and has had better response with nebulizer during ER visits Educated about use of maintenance therapy and rescue inhaler       Relevant Medications   ipratropium-albuterol (DUONEB) 0.5-2.5 (3) MG/3ML SOLN   Budeson-Glycopyrrol-Formoterol (BREZTRI AEROSPHERE) 160-9-4.8 MCG/ACT AERO     Endocrine   Hypothyroidism    Continue Levothyroxine to 88 mcg QD Check TSH, free T4 Reviewed US thyroid         Genitourinary   CKD (chronic kidney disease) stage 4, GFR 15-29 ml/min (HCC)    Follows up with nephrologist Follows up with hematooncologist for evaluation of elevated light  chain levels Advised to follow renal diet as advised by nephrologist and dietitian Avoid nephrotoxic agents including NSAIDs Last BMP reviewed from nephrology visit        Meds ordered this encounter  Medications   ipratropium-albuterol (DUONEB) 0.5-2.5 (3) MG/3ML SOLN    Sig: Take 3 mLs by nebulization every 6 (six) hours as needed.    Dispense:  360 mL    Refill:  2    Please dispense nebulizer machine as well.   Budeson-Glycopyrrol-Formoterol (BREZTRI AEROSPHERE) 160-9-4.8 MCG/ACT AERO    Sig: Inhale 2 puffs into the lungs 2 (two) times daily.    Dispense:  10.7 g    Refill:  11    Follow-up: Return if symptoms worsen or fail to improve.    Lindell Spar, MD

## 2021-08-11 NOTE — Assessment & Plan Note (Addendum)
Uncontrolled Has been using Albuterol frequently Had added Advair as maintenance therapy, but does not like to use it Switched to [place] due to uncontrolled COPD Prescribed Duoneb for better compliance and has had better response with nebulizer during ER visits Educated about use of maintenance therapy and rescue inhaler

## 2021-08-11 NOTE — Assessment & Plan Note (Signed)
Follows up with nephrologist Follows up with hematooncologist for evaluation of elevated light chain levels Advised to follow renal diet as advised by nephrologist and dietitian Avoid nephrotoxic agents including NSAIDs Last BMP reviewed from nephrology visit

## 2021-08-11 NOTE — Assessment & Plan Note (Signed)
Continue Levothyroxine to 88 mcg QD Check TSH, free T4 Reviewed US thyroid 

## 2021-08-11 NOTE — Assessment & Plan Note (Addendum)
BP Readings from Last 1 Encounters:  08/11/21 122/70   Well-controlled with Coreg 6.25 mg twice daily and Chlorthalidone 12.5 mg QD Counseled for compliance with the medications Advised DASH diet and moderate exercise/walking as tolerated Chronic pain and CKD also contributing to high BP Followed by Nephrologist

## 2021-08-12 DIAGNOSIS — I509 Heart failure, unspecified: Secondary | ICD-10-CM | POA: Diagnosis not present

## 2021-08-14 DIAGNOSIS — M1990 Unspecified osteoarthritis, unspecified site: Secondary | ICD-10-CM | POA: Diagnosis not present

## 2021-08-14 DIAGNOSIS — R5381 Other malaise: Secondary | ICD-10-CM | POA: Diagnosis not present

## 2021-08-14 DIAGNOSIS — M170 Bilateral primary osteoarthritis of knee: Secondary | ICD-10-CM | POA: Diagnosis not present

## 2021-09-12 DIAGNOSIS — I509 Heart failure, unspecified: Secondary | ICD-10-CM | POA: Diagnosis not present

## 2021-09-14 DIAGNOSIS — M1990 Unspecified osteoarthritis, unspecified site: Secondary | ICD-10-CM | POA: Diagnosis not present

## 2021-09-14 DIAGNOSIS — M170 Bilateral primary osteoarthritis of knee: Secondary | ICD-10-CM | POA: Diagnosis not present

## 2021-09-14 DIAGNOSIS — R5381 Other malaise: Secondary | ICD-10-CM | POA: Diagnosis not present

## 2021-09-22 DIAGNOSIS — M170 Bilateral primary osteoarthritis of knee: Secondary | ICD-10-CM | POA: Diagnosis not present

## 2021-09-22 DIAGNOSIS — J449 Chronic obstructive pulmonary disease, unspecified: Secondary | ICD-10-CM | POA: Insufficient documentation

## 2021-09-22 DIAGNOSIS — I2510 Atherosclerotic heart disease of native coronary artery without angina pectoris: Secondary | ICD-10-CM | POA: Insufficient documentation

## 2021-10-01 ENCOUNTER — Encounter (HOSPITAL_COMMUNITY): Payer: Self-pay | Admitting: *Deleted

## 2021-10-01 ENCOUNTER — Other Ambulatory Visit: Payer: Self-pay

## 2021-10-01 ENCOUNTER — Emergency Department (HOSPITAL_COMMUNITY)
Admission: EM | Admit: 2021-10-01 | Discharge: 2021-10-01 | Disposition: A | Discharge status: Home / Self Care | Payer: Medicare Other | Attending: Emergency Medicine | Admitting: Emergency Medicine

## 2021-10-01 DIAGNOSIS — Z79899 Other long term (current) drug therapy: Secondary | ICD-10-CM | POA: Insufficient documentation

## 2021-10-01 DIAGNOSIS — I10 Essential (primary) hypertension: Secondary | ICD-10-CM | POA: Insufficient documentation

## 2021-10-01 DIAGNOSIS — K1379 Other lesions of oral mucosa: Secondary | ICD-10-CM | POA: Insufficient documentation

## 2021-10-01 DIAGNOSIS — R6884 Jaw pain: Secondary | ICD-10-CM | POA: Diagnosis not present

## 2021-10-01 MED ORDER — LIDOCAINE VISCOUS HCL 2 % MT SOLN: 10.0000 mL | OROMUCOSAL | 0 refills | Status: DC | PRN

## 2021-10-01 MED ORDER — LIDOCAINE VISCOUS HCL 2 % MT SOLN
15.0000 mL | Freq: Once | OROMUCOSAL | Status: AC
Start: 2021-10-01 — End: 2021-10-01
  Administered 2021-10-01: 15 mL via OROMUCOSAL
  Filled 2021-10-01: qty 15

## 2021-10-01 NOTE — Discharge Instructions (Signed)
Please use the prescribed lidocaine 3 times daily as needed for mouth pain and be sure to follow-up with your physician.  Return here for concerning changes in your condition.

## 2021-10-01 NOTE — ED Provider Notes (Signed)
[facility] EMERGENCY DEPARTMENT Provider Note   CSN: [id number] Arrival date & time: 10/01/21  1535     History  Chief Complaint  Patient presents with   Mouth Lesions    [name] is a 86 y.o. female.  HPI Patient presents with concern of multiple sores in her mouth.  She has had sores and discomfort for few days possibly week, states that she is tired of having them, and today 6 evaluation.  She wears dentures.  She states that she is otherwise well denies pain, dyspnea, fever.  She is here with her son who assists with the history.  She has a known history of hypertension, states that she has always had difficulty with controlling, no notable change today.     Home Medications Prior to Admission medications   Medication Sig Start Date End Date Taking? Authorizing Provider  lidocaine (XYLOCAINE) 2 % solution Use as directed 10 mLs in the mouth or throat as needed for mouth pain. 10/01/21  Yes Carmin Muskrat, MD  albuterol (VENTOLIN HFA) 108 (90 Base) MCG/ACT inhaler Inhale 1-2 puffs into the lungs every 6 (six) hours as needed for wheezing or shortness of breath. 12/28/20   Lindell Spar, MD  atorvastatin (LIPITOR) 20 MG tablet TAKE ONE TABLET BY MOUTH ONCE DAILY. 04/12/21   Fayrene Helper, MD  Budeson-Glycopyrrol-Formoterol (BREZTRI AEROSPHERE) 160-9-4.8 MCG/ACT AERO Inhale 2 puffs into the lungs 2 (two) times daily. 08/11/21   Lindell Spar, MD  carvedilol (COREG) 6.25 MG tablet TAKE (1) TABLET BY MOUTH TWICE DAILY. 08/02/21   Fay Records, MD  chlorthalidone (HYGROTON) 25 MG tablet Take 12.5 mg by mouth daily. 05/23/21   [provider]  DULoxetine (CYMBALTA) 30 MG capsule Take 1 capsule (30 mg total) by mouth daily. 12/22/19 11/26/21  Lindell Spar, MD  hydrOXYzine (ATARAX) 10 MG tablet hydroxyzine HCl 10 mg tablet    [provider]  ipratropium-albuterol (DUONEB) 0.5-2.5 (3) MG/3ML SOLN Take 3 mLs by nebulization every 6 (six) hours as needed.  08/11/21   Lindell Spar, MD  levothyroxine (SYNTHROID) 88 MCG tablet TAKE (1) TABLET BY MOUTH EACH MORNING. 07/12/21   Lindell Spar, MD  mirtazapine (REMERON) 7.5 MG tablet TAKE (1) TABLET BY MOUTH AT BEDTIME. 08/02/21   Lindell Spar, MD  Multiple Vitamins-Minerals (EYE VITAMINS PO) Take 1 tablet by mouth daily.     [provider]  omeprazole (PRILOSEC) 40 MG capsule TAKE (1) CAPSULE BY MOUTH TWICE DAILY FOR REFLUX. 05/31/21   Lindell Spar, MD  ondansetron (ZOFRAN) 4 MG tablet TAKE 1 TABLET BY MOUTH THREE TIMES AS NEEDED FOR NAUSEA. 03/09/21   Lindell Spar, MD  oxyCODONE-acetaminophen (PERCOCET/ROXICET) 5-325 MG tablet oxycodone-acetaminophen 5 mg-325 mg tablet    [provider]  pantoprazole (PROTONIX) 40 MG tablet TAKE (1) TABLET BY MOUTH ONCE DAILY. 07/12/21   Lindell Spar, MD  UNABLE TO FIND Wheelchair (small) - OA and physical deconditioning (M19.90, R53.81) 12/28/20   Lindell Spar, MD  UNABLE TO FIND Hand held shower head with long cord 01/25/21   Lindell Spar, MD      Allergies    Hydralazine, Nalbuphine, Paroxetine, Sulfa antibiotics, and Venlafaxine    Review of Systems   Review of Systems  All other systems reviewed and are negative.   Physical Exam Updated Vital Signs BP (!) 205/75 (BP Location: Left Arm)   Pulse 60   Temp 98.8 F (37.1 C) (Oral)  Resp 18   Ht 5' (1.524 m)   Wt 51 kg   SpO2 100%   BMI 21.95 kg/m  Physical Exam Vitals and nursing note reviewed.  Constitutional:      General: She is not in acute distress.    Appearance: She is well-developed.     Comments: Thin adult female awake and alert speaking in an animated fashion, no distress  HENT:     Head: Normocephalic and atraumatic.     Mouth/Throat:   Eyes:     Conjunctiva/sclera: Conjunctivae normal.  Pulmonary:     Effort: Pulmonary effort is normal. No respiratory distress.     Breath sounds: No stridor.  Abdominal:     General: There is no distension.   Skin:    General: Skin is warm and dry.  Neurological:     Mental Status: She is alert and oriented to person, place, and time.     Cranial Nerves: No cranial nerve deficit.  Psychiatric:        Mood and Affect: Mood normal.     ED Results / Procedures / Treatments   Labs (all labs ordered are listed, but only abnormal results are displayed) Labs Reviewed - No data to display  EKG None  Radiology No results found.  Procedures Procedures    Medications Ordered in ED Medications  lidocaine (XYLOCAINE) 2 % viscous mouth solution 15 mL (has no administration in time range)    ED Course/ Medical Decision Making/ A&P This patient with a Hx of hypertension, denture use presents to the ED for concern of mouth pain, this involves an extensive number of treatment options, and is a complaint that carries with it a high risk of complications and morbidity.    The differential diagnosis includes infection, bacteremia, sepsis, abscess   Social Determinants of Health:  Age  Additional history obtained:  Additional history and/or information obtained from son, notable for HPI   After the initial evaluation, orders, including: Discussed lidocaine were initiated.    Dispostion / Final MDM:  After consideration of the diagnostic results and the patient's response to treatment, this adult female presents with mouth sores, on exam has minimally visible ulcerations, no asymmetry, edema, erythema, purulence.  No facial asymmetry, no fever, no evidence for bacteremia, sepsis.  Patient does have mild hypertension though this is a known entity for her and she is comfortable with following up with her outpatient doc both for this as well as for follow-up after initiation of viscous lidocaine for her mouth lesions.  Final Clinical Impression(s) / ED Diagnoses Final diagnoses:  Mouth pain    Rx / DC Orders ED Discharge Orders          Ordered    lidocaine (XYLOCAINE) 2 % solution   As needed        10/01/21 1627              Carmin Muskrat, MD 10/01/21 1628

## 2021-10-01 NOTE — ED Triage Notes (Signed)
Pt states she has a sore mouth and some blisters in her mouth. Pt states she wears dentures.

## 2021-10-02 ENCOUNTER — Telehealth (HOSPITAL_COMMUNITY): Payer: Self-pay | Admitting: Emergency Medicine

## 2021-10-02 MED ORDER — LIDOCAINE VISCOUS HCL 2 % MT SOLN: 10.0000 mL | OROMUCOSAL | 0 refills | Status: DC | PRN

## 2021-10-02 NOTE — Telephone Encounter (Signed)
Patient requested recent prescription for Xylocaine to be sent to alternative pharmacy.  Prescription was updated.

## 2021-10-05 ENCOUNTER — Other Ambulatory Visit: Payer: Self-pay | Admitting: Internal Medicine

## 2021-10-05 DIAGNOSIS — Z79891 Long term (current) use of opiate analgesic: Secondary | ICD-10-CM | POA: Diagnosis not present

## 2021-10-05 DIAGNOSIS — M130 Polyarthritis, unspecified: Secondary | ICD-10-CM | POA: Diagnosis not present

## 2021-10-05 DIAGNOSIS — G894 Chronic pain syndrome: Secondary | ICD-10-CM | POA: Diagnosis not present

## 2021-10-12 DIAGNOSIS — I509 Heart failure, unspecified: Secondary | ICD-10-CM | POA: Diagnosis not present

## 2021-10-13 ENCOUNTER — Encounter: Payer: Self-pay | Admitting: Nurse Practitioner

## 2021-10-13 ENCOUNTER — Ambulatory Visit (INDEPENDENT_AMBULATORY_CARE_PROVIDER_SITE_OTHER): Payer: Medicare Other | Admitting: Nurse Practitioner

## 2021-10-13 VITALS — BP 170/80 | HR 56 | Ht 60.0 in | Wt 106.0 lb

## 2021-10-13 DIAGNOSIS — R350 Frequency of micturition: Secondary | ICD-10-CM

## 2021-10-13 DIAGNOSIS — I10 Essential (primary) hypertension: Secondary | ICD-10-CM | POA: Diagnosis not present

## 2021-10-13 DIAGNOSIS — Z09 Encounter for follow-up examination after completed treatment for conditions other than malignant neoplasm: Secondary | ICD-10-CM | POA: Diagnosis not present

## 2021-10-13 DIAGNOSIS — K1379 Other lesions of oral mucosa: Secondary | ICD-10-CM | POA: Diagnosis not present

## 2021-10-13 LAB — POCT URINALYSIS DIP (CLINITEK)
Bilirubin, UA: NEGATIVE
Glucose, UA: NEGATIVE mg/dL
Ketones, POC UA: NEGATIVE mg/dL
Nitrite, UA: NEGATIVE
POC PROTEIN,UA: 100 — AB
Spec Grav, UA: 1.02 (ref 1.010–1.025)
Urobilinogen, UA: 0.2 E.U./dL
pH, UA: 5.5 (ref 5.0–8.0)

## 2021-10-13 NOTE — Patient Instructions (Addendum)
Please continue to use lidocaine mouth was as ordered   Take your blood pressure medications as ordered.    We has sent your urine to the lab for culture will get back to you about the result.   Thanks for choosing [facility], we consider it a privelige to serve you.

## 2021-10-13 NOTE — Assessment & Plan Note (Addendum)
BP Readings from Last 3 Encounters:  10/13/21 (!) 170/80  10/01/21 (!) 189/102  08/11/21 122/70  Patient denies  headache, chest pain, dizziness On Coreg 6.25 mg twice daily, states that she takes chlorthalidone 12.5 mg only when her blood pressure is elevated. Patient encouraged to take chlorthalidone when she gets home today continue current 6.25 mg twice daily.  Patient encouraged to maintain close follow-up with nephrology.  Follow-up with PCP as planned

## 2021-10-13 NOTE — Progress Notes (Signed)
   [name]     MRN: [id number]      DOB: [birth year]   HPI [name] with past medical history of hypertension, COPD, GERD, hypothyroidism is here for follow up her visit to the emergency room on 7 6 for mouth pain Patient stated that her mouth pain is much better she has been using lidocaine solution  as ordered she initially had trouble swallowing but not anymore she denies fever, chills, malaise.  She wear dentures    Has urinary frequency, dysuria, lower abdominal pain since last week Denies bloody urine , hesitancy, incontinence, fever, chills,flank pain  has had UTI in the past     ROS Denies recent fever or chills. Denies sinus pressure, nasal congestion, ear pain or sore throat. Denies chest congestion, productive cough or wheezing. Denies chest pains, palpitations and leg swelling Denies nausea, vomiting,diarrhea or constipation.   Denies headaches, seizures, numbness, or tingling. Denies depression, anxiety or insomnia.    PE  BP (!) 170/80   Pulse (!) 56   Ht 5' (1.524 m)   Wt 106 lb (48.1 kg)   SpO2 95%   BMI 20.70 kg/m   Patient alert and oriented and in no cardiopulmonary distress.  HEENT: No facial asymmetry, EOMI,     Neck supple .  No lesion, redness, exudate noted on examination of the mouth.   Chest: Clear to auscultation bilaterally.  CVS: S1, S2 no murmurs, no S3.Regular rate.  ABD: Soft non tender, no masses palpated , no CVA tenderness   Ext: No edema  MS: pt is sitting in a wheelchair  Psych: Good eye contact, normal affect. Memory intact not anxious or depressed appearing.    Assessment & Plan  Primary hypertension BP Readings from Last 3 Encounters:  10/13/21 (!) 170/80  10/01/21 (!) 189/102  08/11/21 122/70  Patient denies  headache, chest pain, dizziness On Coreg 6.25 mg twice daily, states that she takes chlorthalidone 12.5 mg only when her blood pressure is elevated. Patient encouraged to take chlorthalidone when she  gets home today continue current 6.25 mg twice daily.  Patient encouraged to maintain close follow-up with nephrology.  Follow-up with PCP as planned  Frequent urination Urine analysis shows trace leukocytes negative nitrites trace blood. Urine sent to the lab for culture Patient told to drink at least 64 ounces of water daily to maintain hydration  Mouth pain Mouth pain feels much better Continue lidocaine solution 51m as needed No redness, lesion, exudate noted on examination of the mouth  Encounter for examination following treatment at hospital Recent ER visit for mouth pain Mouth  pain feels much better On lidocaine solution 10 mils as needed ER chart reviewed Patient told to continue lidocaine solution as needed

## 2021-10-13 NOTE — Assessment & Plan Note (Addendum)
Urine analysis shows trace leukocytes negative nitrites trace blood. Urine sent to the lab for culture Patient told to drink at least 64 ounces of water daily to maintain hydration

## 2021-10-13 NOTE — Assessment & Plan Note (Signed)
Mouth pain feels much better Continue lidocaine solution 39m as needed No redness, lesion, exudate noted on examination of the mouth

## 2021-10-13 NOTE — Assessment & Plan Note (Signed)
Recent ER visit for mouth pain Mouth  pain feels much better On lidocaine solution 10 mils as needed ER chart reviewed Patient told to continue lidocaine solution as needed

## 2021-10-14 DIAGNOSIS — M170 Bilateral primary osteoarthritis of knee: Secondary | ICD-10-CM | POA: Diagnosis not present

## 2021-10-14 DIAGNOSIS — R5381 Other malaise: Secondary | ICD-10-CM | POA: Diagnosis not present

## 2021-10-14 DIAGNOSIS — M1990 Unspecified osteoarthritis, unspecified site: Secondary | ICD-10-CM | POA: Diagnosis not present

## 2021-10-15 ENCOUNTER — Other Ambulatory Visit: Payer: Self-pay | Admitting: Nurse Practitioner

## 2021-10-15 DIAGNOSIS — N3001 Acute cystitis with hematuria: Secondary | ICD-10-CM

## 2021-10-15 MED ORDER — AMOXICILLIN-POT CLAVULANATE 500-125 MG PO TABS: 1.0000 | ORAL_TABLET | Freq: Two times a day (BID) | ORAL | 0 refills | Status: DC

## 2021-10-15 NOTE — Progress Notes (Signed)
I have called patient's son and notify him of the test result Augmentin 1 tablet by mouth for 3 days ordered.

## 2021-10-17 ENCOUNTER — Encounter: Payer: Self-pay | Admitting: Internal Medicine

## 2021-10-17 ENCOUNTER — Ambulatory Visit (INDEPENDENT_AMBULATORY_CARE_PROVIDER_SITE_OTHER): Payer: Medicare Other | Admitting: Internal Medicine

## 2021-10-17 VITALS — BP 128/82 | HR 56 | Resp 18 | Ht 60.0 in | Wt 106.0 lb

## 2021-10-17 DIAGNOSIS — G309 Alzheimer's disease, unspecified: Secondary | ICD-10-CM

## 2021-10-17 DIAGNOSIS — E039 Hypothyroidism, unspecified: Secondary | ICD-10-CM

## 2021-10-17 DIAGNOSIS — F02B18 Dementia in other diseases classified elsewhere, moderate, with other behavioral disturbance: Secondary | ICD-10-CM

## 2021-10-17 DIAGNOSIS — N184 Chronic kidney disease, stage 4 (severe): Secondary | ICD-10-CM | POA: Diagnosis not present

## 2021-10-17 DIAGNOSIS — K1379 Other lesions of oral mucosa: Secondary | ICD-10-CM | POA: Diagnosis not present

## 2021-10-17 DIAGNOSIS — J439 Emphysema, unspecified: Secondary | ICD-10-CM

## 2021-10-17 DIAGNOSIS — I10 Essential (primary) hypertension: Secondary | ICD-10-CM | POA: Diagnosis not present

## 2021-10-17 LAB — URINE CULTURE

## 2021-10-17 MED ORDER — NYSTATIN 100000 UNIT/ML MT SUSP: 5.0000 mL | Freq: Four times a day (QID) | OROMUCOSAL | 0 refills | Status: DC

## 2021-10-17 NOTE — Progress Notes (Unsigned)
Established Patient Office Visit  Subjective:  Patient ID: [name], female    DOB: 10-Nov-1935  Age: 86 y.o. MRN: [id number]  CC:  Chief Complaint  Patient presents with   Follow-up    Pt saw fola 10-13-21 for mouth pain was also seen in er 10-01-21 still having mouth pain and pharmacy is supposed to deliver antibiotic for mouth today has not started this     HPI [name] is a 86 y.o. female with past medical history of hypertension, CKD stage IV, fibromyalgia, chronic pelvic pain, chronic constipation, hypothyroidism, osteoarthritis of knees and depression who presents for f/u of her chronic medical conditions.  She c/o mouth pain for the last 1 month. She went to ER and was given Lidocaine solution, which helped somewhat. She denies noticing any ulcers. Of note, she uses Breztri for COPD and needs to rinse her mouth after each use.  She has not started abx yet for UTI. She has mild dysuria and urinary frequency, but denies any hematuria, fever, chills or vomiting.  HTN: Her BP is well-controlled. Followed by nephrology.  Denies any chest pain, headache or dizziness currently.  CKD stage IV: Followed by nephrology.  She complains of mild dysuria recently.  Denies any fever, chills or hematuria currently.  She is due for urine testing for CKD and hyponatremia.  Hypothyroidism: Has been taking levothyroxine.  COPD: Her breathing has improved with Breztri and as needed albuterol now.  She denies any acute worsening of dyspnea or wheezing currently.   Past Medical History:  Diagnosis Date   Anxiety disorder, unspecified    Arteriosclerotic cardiovascular disease (ASCVD) 2006   Nonobstructive; < 50% lesions on cath 2002; negative stress nuclear study in 10/2004   Asthma    Chest pain, unspecified    Chronic kidney disease, unspecified    Chronic obstructive pulmonary disease, unspecified ([place])    Chronic pelvic pain in female    Colitis due to Clostridium difficile 1994    1994   COPD (chronic obstructive pulmonary disease) ([place])    Depression with anxiety    Diarrhea, unspecified    Disorder of thyroid, unspecified    Gastro-esophageal reflux disease without esophagitis    Gastroesophageal reflux disease    Hyperlipidemia    Hyperlipidemia, unspecified    Hypertension    Hypertensive chronic kidney disease with stage 1 through stage 4 chronic kidney disease, or unspecified chronic kidney disease    Hyperthyroidism    Hypothyroidism    Hypothyroidism, unspecified    Kidney disease, chronic, stage III (moderate, EGFR 30-59 ml/min) (HCC)    Lower abdominal pain, unspecified    Major depressive disorder, single episode, severe without psychotic features ([place])    Major depressive disorder, single episode, unspecified    Nausea    Other postherpetic nervous system involvement    Pelvic and perineal pain    Rash and other nonspecific skin eruption    S/P colonoscopy 2007   few diverticula, otherwise nl   S/P colonoscopy 12/13/10   rectal, cecal polyp, left-sided diverticulosis, hyperplastic. ?anal fissure? treated empirically   S/P endoscopy 2009   linear esophageal erosions   S/P endoscopy 05/10/10   Question island of salmon-colored epithelium in distal esophagus ; no Barrett's.    Shingles    Tobacco abuse, in remission    55-pack-year consumption; discontinued 08/2010   Unspecified asthma, uncomplicated    Unspecified osteoarthritis, unspecified site    Urinary tract infection, site not specified  Past Surgical History:  Procedure Laterality Date   ABDOMINAL HYSTERECTOMY     BREAST LUMPECTOMY     CATARACT EXTRACTION     CHOLECYSTECTOMY  1962   COLONOSCOPY  12/13/2010   Left-sided diverticulosis.  Cecal polyp, status post hot snare polypectomy/ Diminutive rectal polyp, status post cold biopsy removal tender/painful anal canal, ? occult anal fissure- Not visualized   COLONOSCOPY N/A 10/14/2015   Diverticulosis   ESOPHAGOGASTRODUODENOSCOPY   05/10/10   benign mucosa with mild chronic inflammation.   ESOPHAGOGASTRODUODENOSCOPY (EGD) WITH PROPOFOL N/A 02/13/2019   Procedure: ESOPHAGOGASTRODUODENOSCOPY (EGD) WITH PROPOFOL;  Surgeon: Daneil Dolin, MD;  Location: [facility];  Service: Endoscopy;  Laterality: N/A;  3:00pm   FLEXIBLE SIGMOIDOSCOPY  12/29/2011   Procedure: FLEXIBLE SIGMOIDOSCOPY;  Surgeon: Rogene Houston, MD;  Location: [facility];  Service: Endoscopy;  Laterality: N/A;  200-Ann notified pt to be here @ 1:61   NISSEN FUNDOPLICATION     YAG LASER APPLICATION Left 0/96/0454   Procedure: YAG LASER APPLICATION;  Surgeon: Williams Che, MD;  Location: [facility];  Service: Ophthalmology;  Laterality: Left;    Family History  Problem Relation Age of Onset   Diabetes Mother    Alzheimer's disease Mother    Cancer Mother    Heart disease Father    Cancer Sister    Heart disease Brother        Also mother, Father, brother and 2 sons   Cancer Son        testicular cancer    Aneurysm Son    Diabetes Son    Hypertension Brother        also Sister x2   Colon cancer Neg Hx     Social History   Socioeconomic History   Marital status: Married    Spouse name: Not on file   Number of children: 4   Years of education: Not on file   Highest education level: Not on file  Occupational History   Not on file  Tobacco Use   Smoking status: Former    Packs/day: 1.00    Years: 55.00    Total pack years: 55.00    Types: Cigarettes    Quit date: 09/15/2010    Years since quitting: 11.1   Smokeless tobacco: Never  Vaping Use   Vaping Use: Never used  Substance and Sexual Activity   Alcohol use: No    Alcohol/week: 0.0 standard drinks of alcohol   Drug use: No   Sexual activity: Not Currently  Other Topics Concern   Not on file  Social History Narrative   Not on file   Social Determinants of Health   Financial Resource Strain: Low Risk  (09/06/2020)   Overall Financial Resource Strain (CARDIA)    Difficulty  of Paying Living Expenses: Not hard at all  Food Insecurity: No Food Insecurity (09/06/2020)   Hunger Vital Sign    Worried About Running Out of Food in the Last Year: Never true    [place] in the Last Year: Never true  Transportation Needs: No Transportation Needs (09/06/2020)   PRAPARE - Hydrologist (Medical): No    Lack of Transportation (Non-Medical): No  Physical Activity: Inactive (09/06/2020)   Exercise Vital Sign    Days of Exercise per Week: 0 days    Minutes of Exercise per Session: 0 min  Stress: No Stress Concern Present (09/06/2020)   [place]  Stress Questionnaire    Feeling of Stress : Not at all  Social Connections: Moderately Isolated (09/06/2020)   Social Connection and Isolation Panel [NHANES]    Frequency of Communication with Friends and Family: More than three times a week    Frequency of Social Gatherings with Friends and Family: More than three times a week    Attends Religious Services: Never    Marine scientist or Organizations: No    Attends Archivist Meetings: Never    Marital Status: Married  Human resources officer Violence: Not At Risk (09/06/2020)   Humiliation, Afraid, Rape, and Kick questionnaire    Fear of Current or Ex-Partner: No    Emotionally Abused: No    Physically Abused: No    Sexually Abused: No    Outpatient Medications Prior to Visit  Medication Sig Dispense Refill   albuterol (VENTOLIN HFA) 108 (90 Base) MCG/ACT inhaler Inhale 1-2 puffs into the lungs every 6 (six) hours as needed for wheezing or shortness of breath. 8 g 5   amoxicillin-clavulanate (AUGMENTIN) 500-125 MG tablet Take 1 tablet (500 mg total) by mouth 2 (two) times daily. 6 tablet 0   atorvastatin (LIPITOR) 20 MG tablet TAKE ONE TABLET BY MOUTH ONCE DAILY. 90 tablet 0   Budeson-Glycopyrrol-Formoterol (BREZTRI AEROSPHERE) 160-9-4.8 MCG/ACT AERO Inhale 2 puffs into the lungs 2 (two)  times daily. 10.7 g 11   carvedilol (COREG) 6.25 MG tablet TAKE (1) TABLET BY MOUTH TWICE DAILY. 30 tablet 0   chlorthalidone (HYGROTON) 25 MG tablet Take 12.5 mg by mouth daily.     DULoxetine (CYMBALTA) 30 MG capsule Take 1 capsule (30 mg total) by mouth daily. 90 capsule 0   hydrOXYzine (ATARAX) 10 MG tablet hydroxyzine HCl 10 mg tablet     ipratropium-albuterol (DUONEB) 0.5-2.5 (3) MG/3ML SOLN Take 3 mLs by nebulization every 6 (six) hours as needed. 360 mL 2   levothyroxine (SYNTHROID) 88 MCG tablet TAKE (1) TABLET BY MOUTH EACH MORNING. 90 tablet 0   lidocaine (XYLOCAINE) 2 % solution Use as directed 10 mLs in the mouth or throat as needed for mouth pain. 100 mL 0   mirtazapine (REMERON) 7.5 MG tablet TAKE (1) TABLET BY MOUTH AT BEDTIME. 30 tablet 5   Multiple Vitamins-Minerals (EYE VITAMINS PO) Take 1 tablet by mouth daily.     omeprazole (PRILOSEC) 40 MG capsule TAKE (1) CAPSULE BY MOUTH TWICE DAILY FOR REFLUX. 180 capsule 0   ondansetron (ZOFRAN) 4 MG tablet TAKE 1 TABLET BY MOUTH THREE TIMES AS NEEDED FOR NAUSEA. 90 tablet 0   oxyCODONE-acetaminophen (PERCOCET/ROXICET) 5-325 MG tablet oxycodone-acetaminophen 5 mg-325 mg tablet     pantoprazole (PROTONIX) 40 MG tablet TAKE (1) TABLET BY MOUTH ONCE DAILY. 90 tablet 0   UNABLE TO FIND Wheelchair (small) - OA and physical deconditioning (M19.90, R53.81) 1 each 0   UNABLE TO FIND Hand held shower head with long cord 1 each 0   No facility-administered medications prior to visit.    Allergies  Allergen Reactions   Hydralazine Other (See Comments)    Breathing   Nalbuphine Rash   Paroxetine Itching   Sulfa Antibiotics Other (See Comments)    Childhood reaction.   Venlafaxine Itching    ROS Review of Systems  Constitutional:  Positive for fatigue. Negative for appetite change, chills and fever.  HENT:  Negative for congestion, sinus pressure, sinus pain and sore throat.   Eyes:  Negative for pain and discharge.  Respiratory:   Positive for  shortness of breath. Negative for cough.   Cardiovascular:  Positive for leg swelling. Negative for chest pain and palpitations.  Gastrointestinal:  Negative for abdominal pain, diarrhea, nausea and vomiting.  Endocrine: Negative for polydipsia and polyuria.  Genitourinary:  Positive for dysuria and frequency. Negative for hematuria.  Musculoskeletal:  Positive for arthralgias and back pain. Negative for neck pain and neck stiffness.  Skin:  Negative for rash.  Neurological:  Negative for dizziness and weakness.  Psychiatric/Behavioral:  Positive for hallucinations and sleep disturbance. Negative for agitation and behavioral problems.       Objective:    Physical Exam Vitals reviewed.  Constitutional:      General: She is not in acute distress.    Appearance: She is not diaphoretic.     Comments: In wheelchair  HENT:     Head: Normocephalic and atraumatic.     Nose: Nose normal.     Mouth/Throat:     Mouth: Mucous membranes are moist.  Eyes:     General: No scleral icterus.    Extraocular Movements: Extraocular movements intact.  Cardiovascular:     Rate and Rhythm: Normal rate and regular rhythm.     Pulses: Normal pulses.     Heart sounds: Normal heart sounds. No murmur heard. Pulmonary:     Breath sounds: Normal breath sounds. No wheezing or rales.  Abdominal:     Palpations: Abdomen is soft.     Tenderness: There is no abdominal tenderness.  Musculoskeletal:        General: Swelling (Right knee, no warmth) present.     Cervical back: Neck supple. No tenderness.     Right lower leg: No edema.     Left lower leg: No edema.  Skin:    General: Skin is warm.     Findings: Erythema (Senile purpuric lesions (b/l UE)) present.  Neurological:     General: No focal deficit present.     Mental Status: She is alert and oriented to person, place, and time. Mental status is at baseline.     Sensory: No sensory deficit.     Motor: Weakness (3+ in b/l UE and LE)  present.     Gait: Gait abnormal.  Psychiatric:        Mood and Affect: Mood normal.        Behavior: Behavior normal.     BP 128/82 (BP Location: Right Arm, Patient Position: Sitting, Cuff Size: Normal)   Pulse (!) 56   Resp 18   Ht 5' (1.524 m)   Wt 106 lb (48.1 kg)   SpO2 97%   BMI 20.70 kg/m  Wt Readings from Last 3 Encounters:  10/17/21 106 lb (48.1 kg)  10/13/21 106 lb (48.1 kg)  10/01/21 112 lb 6 oz (51 kg)    Lab Results  Component Value Date   TSH 2.330 10/17/2021   Lab Results  Component Value Date   WBC 6.7 10/17/2021   HGB 10.1 (L) 10/17/2021   HCT 31.5 (L) 10/17/2021   MCV 92 10/17/2021   PLT 239 10/17/2021   Lab Results  Component Value Date   NA 141 10/17/2021   K 4.9 10/17/2021   CO2 20 10/17/2021   GLUCOSE 90 10/17/2021   BUN 34 (H) 10/17/2021   CREATININE 2.20 (H) 10/17/2021   BILITOT 0.2 03/10/2021   ALKPHOS 80 03/10/2021   AST 14 03/10/2021   ALT 7 03/10/2021   PROT 6.5 03/10/2021   ALBUMIN 3.4 (L) 03/10/2021   CALCIUM  9.4 10/17/2021   ANIONGAP 9 08/01/2021   EGFR 22 (L) 10/17/2021   Lab Results  Component Value Date   CHOL 164 09/17/2018   Lab Results  Component Value Date   HDL 44 09/17/2018   Lab Results  Component Value Date   LDLCALC 95 09/17/2018   Lab Results  Component Value Date   TRIG 145 09/17/2018   Lab Results  Component Value Date   CHOLHDL 2.9 07/15/2008   Lab Results  Component Value Date   HGBA1C 5.3 06/14/2020      Assessment & Plan:   Problem List Items Addressed This Visit       Cardiovascular and Mediastinum   Primary hypertension - Primary    BP Readings from Last 1 Encounters:  10/17/21 128/82  Well-controlled with Coreg 6.25 mg twice daily and Chlorthalidone 12.5 mg QD Counseled for compliance with the medications Advised DASH diet Chronic pain and CKD also contributing to high BP Followed by Nephrologist        Respiratory   Chronic obstructive pulmonary disease, unspecified  ([place])    Well controlled now with Judithann Sauger - needs to rinse mouth after each use Has Duoneb for better compliance and has had better response with nebulizer during ER visits Educated about use of maintenance therapy and rescue inhaler        Endocrine   Hypothyroidism    Continue Levothyroxine to 88 mcg QD Check TSH, free T4 Reviewed US thyroid      Relevant Orders   TSH + free T4 (Completed)     Nervous and Auditory   Moderate Alzheimer's dementia (HCC)    Cognitive decline with insomnia and episodes of hallucinations Remeron for insomnia Needs frequent reorientation Dependent for ADLs and IADLs, has good family support      Relevant Orders   TSH + free T4 (Completed)   CBC with Differential/Platelet (Completed)     Genitourinary   CKD (chronic kidney disease) stage 4, GFR 15-29 ml/min ([place])    Follows up with nephrologist Follows up with hematooncologist for evaluation of elevated light chain levels Advised to follow renal diet as advised by nephrologist and dietitian Avoid nephrotoxic agents including NSAIDs Last BMP reviewed from nephrology visit - needs follow up visit      Relevant Orders   Basic Metabolic Panel (BMET) (Completed)   CBC with Differential/Platelet (Completed)     Other   Mouth pain    Likely due to oral steroid inhaler, advised to rinse mouth after each use Continue lidocaine solution for symptomatic relief Nystatin suspension for possible oral candidiasis      Other Visit Diagnoses     Recurrent oral ulcers       Relevant Medications   nystatin (MYCOSTATIN) 100000 UNIT/ML suspension       Meds ordered this encounter  Medications   nystatin (MYCOSTATIN) 100000 UNIT/ML suspension    Sig: Take 5 mLs (500,000 Units total) by mouth 4 (four) times daily.    Dispense:  60 mL    Refill:  0    Follow-up: Return in about 4 months (around 02/17/2022) for Annual physical.    Lindell Spar, MD

## 2021-10-17 NOTE — Patient Instructions (Signed)
Please use Nystatin for mouth sores.  Please continue taking other medications as prescribed.  Please eat at regular intervals and maintain at least 50 ounces of fluid intake.  Please check with your Nephrologist about fluid pill.

## 2021-10-18 LAB — BASIC METABOLIC PANEL
BUN/Creatinine Ratio: 15 (ref 12–28)
BUN: 34 mg/dL — ABNORMAL HIGH (ref 8–27)
CO2: 20 mmol/L (ref 20–29)
Calcium: 9.4 mg/dL (ref 8.7–10.3)
Chloride: 110 mmol/L — ABNORMAL HIGH (ref 96–106)
Creatinine, Ser: 2.2 mg/dL — ABNORMAL HIGH (ref 0.57–1.00)
Glucose: 90 mg/dL (ref 70–99)
Potassium: 4.9 mmol/L (ref 3.5–5.2)
Sodium: 141 mmol/L (ref 134–144)
eGFR: 22 mL/min/{1.73_m2} — ABNORMAL LOW (ref >59)

## 2021-10-18 LAB — CBC WITH DIFFERENTIAL/PLATELET
Basophils Absolute: 0.1 10*3/uL (ref 0.0–0.2)
Basos: 1 %
EOS (ABSOLUTE): 0.3 10*3/uL (ref 0.0–0.4)
Eos: 5 %
Hematocrit: 31.5 % — ABNORMAL LOW (ref 34.0–46.6)
Hemoglobin: 10.1 g/dL — ABNORMAL LOW (ref 11.1–15.9)
Immature Grans (Abs): 0 10*3/uL (ref 0.0–0.1)
Immature Granulocytes: 0 %
Lymphocytes Absolute: 1.7 10*3/uL (ref 0.7–3.1)
Lymphs: 26 %
MCH: 29.4 pg (ref 26.6–33.0)
MCHC: 32.1 g/dL (ref 31.5–35.7)
MCV: 92 fL (ref 79–97)
Monocytes Absolute: 0.4 10*3/uL (ref 0.1–0.9)
Monocytes: 6 %
Neutrophils Absolute: 4.2 10*3/uL (ref 1.4–7.0)
Neutrophils: 62 %
Platelets: 239 10*3/uL (ref 150–450)
RBC: 3.43 x10E6/uL — ABNORMAL LOW (ref 3.77–5.28)
RDW: 16 % — ABNORMAL HIGH (ref 11.7–15.4)
WBC: 6.7 10*3/uL (ref 3.4–10.8)

## 2021-10-18 LAB — TSH+FREE T4
Free T4: 1.33 ng/dL (ref 0.82–1.77)
TSH: 2.33 u[IU]/mL (ref 0.450–4.500)

## 2021-10-19 NOTE — Assessment & Plan Note (Signed)
BP Readings from Last 1 Encounters:  10/17/21 128/82   Well-controlled with Coreg 6.25 mg twice daily and Chlorthalidone 12.5 mg QD Counseled for compliance with the medications Advised DASH diet Chronic pain and CKD also contributing to high BP Followed by Nephrologist

## 2021-10-19 NOTE — Assessment & Plan Note (Signed)
Well controlled now with Judithann Sauger - needs to rinse mouth after each use Has Duoneb for better compliance and has had better response with nebulizer during ER visits Educated about use of maintenance therapy and rescue inhaler

## 2021-10-19 NOTE — Assessment & Plan Note (Signed)
Cognitive decline with insomnia and episodes of hallucinations Remeron for insomnia Needs frequent reorientation Dependent for ADLs and IADLs, has good family support

## 2021-10-19 NOTE — Assessment & Plan Note (Signed)
Continue Levothyroxine to 88 mcg QD Check TSH, free T4 Reviewed US thyroid 

## 2021-10-19 NOTE — Assessment & Plan Note (Signed)
Follows up with nephrologist Follows up with hematooncologist for evaluation of elevated light chain levels Advised to follow renal diet as advised by nephrologist and dietitian Avoid nephrotoxic agents including NSAIDs Last BMP reviewed from nephrology visit - needs follow up visit 

## 2021-10-19 NOTE — Assessment & Plan Note (Signed)
Likely due to oral steroid inhaler, advised to rinse mouth after each use Continue lidocaine solution for symptomatic relief Nystatin suspension for possible oral candidiasis

## 2021-10-20 ENCOUNTER — Telehealth: Payer: Self-pay | Admitting: Internal Medicine

## 2021-10-20 NOTE — Telephone Encounter (Signed)
Jacki Cones [phone]) (is on DPR) called in patient behalf.  Patient has finished antibiotic for UTI but is still experience feelings of UTI. Would like a call back in regard.  Also needs a cll back in regard to lab results

## 2021-10-22 ENCOUNTER — Encounter (HOSPITAL_COMMUNITY): Payer: Self-pay

## 2021-10-22 ENCOUNTER — Emergency Department (HOSPITAL_COMMUNITY)
Admission: EM | Admit: 2021-10-22 | Discharge: 2021-10-22 | Disposition: A | Discharge status: Home / Self Care | Payer: Medicare Other | Attending: Emergency Medicine | Admitting: Emergency Medicine

## 2021-10-22 ENCOUNTER — Other Ambulatory Visit: Payer: Self-pay

## 2021-10-22 DIAGNOSIS — R300 Dysuria: Secondary | ICD-10-CM | POA: Diagnosis present

## 2021-10-22 DIAGNOSIS — Z79899 Other long term (current) drug therapy: Secondary | ICD-10-CM | POA: Diagnosis not present

## 2021-10-22 DIAGNOSIS — J449 Chronic obstructive pulmonary disease, unspecified: Secondary | ICD-10-CM | POA: Diagnosis not present

## 2021-10-22 DIAGNOSIS — N189 Chronic kidney disease, unspecified: Secondary | ICD-10-CM | POA: Diagnosis not present

## 2021-10-22 DIAGNOSIS — Z87891 Personal history of nicotine dependence: Secondary | ICD-10-CM | POA: Diagnosis not present

## 2021-10-22 DIAGNOSIS — I129 Hypertensive chronic kidney disease with stage 1 through stage 4 chronic kidney disease, or unspecified chronic kidney disease: Secondary | ICD-10-CM | POA: Insufficient documentation

## 2021-10-22 DIAGNOSIS — N3090 Cystitis, unspecified without hematuria: Secondary | ICD-10-CM | POA: Diagnosis not present

## 2021-10-22 DIAGNOSIS — E039 Hypothyroidism, unspecified: Secondary | ICD-10-CM | POA: Insufficient documentation

## 2021-10-22 LAB — URINALYSIS, ROUTINE W REFLEX MICROSCOPIC
Bacteria, UA: NONE SEEN
Bilirubin Urine: NEGATIVE
Glucose, UA: NEGATIVE mg/dL
Ketones, ur: NEGATIVE mg/dL
Nitrite: NEGATIVE
Protein, ur: 30 mg/dL — AB
Specific Gravity, Urine: 1.012 (ref 1.005–1.030)
pH: 5 (ref 5.0–8.0)

## 2021-10-22 LAB — CBC WITH DIFFERENTIAL/PLATELET
Abs Immature Granulocytes: 0.01 10*3/uL (ref 0.00–0.07)
Basophils Absolute: 0 10*3/uL (ref 0.0–0.1)
Basophils Relative: 1 %
Eosinophils Absolute: 0.2 10*3/uL (ref 0.0–0.5)
Eosinophils Relative: 3 %
HCT: 29.7 % — ABNORMAL LOW (ref 36.0–46.0)
Hemoglobin: 9.5 g/dL — ABNORMAL LOW (ref 12.0–15.0)
Immature Granulocytes: 0 %
Lymphocytes Relative: 32 %
Lymphs Abs: 1.9 10*3/uL (ref 0.7–4.0)
MCH: 29.8 pg (ref 26.0–34.0)
MCHC: 32 g/dL (ref 30.0–36.0)
MCV: 93.1 fL (ref 80.0–100.0)
Monocytes Absolute: 0.4 10*3/uL (ref 0.1–1.0)
Monocytes Relative: 6 %
Neutro Abs: 3.3 10*3/uL (ref 1.7–7.7)
Neutrophils Relative %: 58 %
Platelets: 246 10*3/uL (ref 150–400)
RBC: 3.19 MIL/uL — ABNORMAL LOW (ref 3.87–5.11)
RDW: 16.7 % — ABNORMAL HIGH (ref 11.5–15.5)
WBC: 5.8 10*3/uL (ref 4.0–10.5)
nRBC: 0 % (ref 0.0–0.2)

## 2021-10-22 LAB — COMPREHENSIVE METABOLIC PANEL
ALT: 11 U/L (ref 0–44)
AST: 12 U/L — ABNORMAL LOW (ref 15–41)
Albumin: 3 g/dL — ABNORMAL LOW (ref 3.5–5.0)
Alkaline Phosphatase: 55 U/L (ref 38–126)
Anion gap: 5 (ref 5–15)
BUN: 33 mg/dL — ABNORMAL HIGH (ref 8–23)
CO2: 20 mmol/L — ABNORMAL LOW (ref 22–32)
Calcium: 9.1 mg/dL (ref 8.9–10.3)
Chloride: 111 mmol/L (ref 98–111)
Creatinine, Ser: 2.22 mg/dL — ABNORMAL HIGH (ref 0.44–1.00)
GFR, Estimated: 21 mL/min — ABNORMAL LOW (ref >60)
Glucose, Bld: 100 mg/dL — ABNORMAL HIGH (ref 70–99)
Potassium: 4.7 mmol/L (ref 3.5–5.1)
Sodium: 136 mmol/L (ref 135–145)
Total Bilirubin: 0.4 mg/dL (ref 0.3–1.2)
Total Protein: 6.6 g/dL (ref 6.5–8.1)

## 2021-10-22 MED ORDER — CEPHALEXIN 500 MG PO CAPS: 500.0000 mg | ORAL_CAPSULE | Freq: Two times a day (BID) | ORAL | 0 refills | Status: AC

## 2021-10-22 MED ORDER — CEPHALEXIN 500 MG PO CAPS
500.0000 mg | ORAL_CAPSULE | Freq: Once | ORAL | Status: AC
  Administered 2021-10-22: 500 mg via ORAL
  Filled 2021-10-22: qty 1

## 2021-10-22 NOTE — ED Notes (Signed)
Pt alert, NAD, calm, interactive, given snack with meds. Family at [facility].

## 2021-10-22 NOTE — ED Notes (Signed)
PT hypertensive at DC. ED PA [facility] informed. Pt denies chest pain, shortness of breath, or headache. Pt reports she is due for her BP meds. PA Danae Chen reports patient can be discharged and to come back if BP does not improve after meds or if chest pain, shortness of breath, or headache occur. PT verbalizes understanding. This RN assists patient to private vehicle via wheelchair and family drives her home. Bryson Corona Edd Fabian

## 2021-10-22 NOTE — ED Triage Notes (Signed)
Pt reports urinary infection. Complain of abdomen pain and burning on urination. Pt states she is weak

## 2021-10-22 NOTE — Discharge Instructions (Signed)
You have a small urinary tract infection.  I have given your first dose of antibiotic here in the emergency department and you can pick up the remainder of your prescription tomorrow morning.  If you develop worsening symptoms including fevers, changes in mental status or vomiting, please return to the emergency department.  Please drink plenty of water to help flush out the infection and I hope you feel better soon!

## 2021-10-22 NOTE — ED Provider Notes (Signed)
[facility] EMERGENCY DEPARTMENT Provider Note   CSN: [id number] Arrival date & time: 10/22/21  1508     History  Chief Complaint  Patient presents with   Urinary Tract Infection   Abdominal Pain    Right and left L pain thinks she has a UTI    [name] is a 86 y.o. female with history of hypothyroidism, CKD, COPD, hypertension, hyperlipidemia who presents to the emergency department with complaints of dysuria.  Patient suspects that she has a urinary tract infection.  She states symptoms started about 3 to 4 days ago and now has suprapubic discomfort with burning on urination.  She states that she sometimes feels a little weak but it is not affecting her ADLs.  She denies fever, chills, chest pain, shortness of breath nausea, vomiting and diarrhea.  She denies hematuria but endorses increased frequency.   Urinary Tract Infection Associated symptoms: abdominal pain   Abdominal Pain      Home Medications Prior to Admission medications   Medication Sig Start Date End Date Taking? Authorizing Provider  albuterol (VENTOLIN HFA) 108 (90 Base) MCG/ACT inhaler Inhale 1-2 puffs into the lungs every 6 (six) hours as needed for wheezing or shortness of breath. 12/28/20  Yes Lindell Spar, MD  amoxicillin-clavulanate (AUGMENTIN) 500-125 MG tablet Take 1 tablet (500 mg total) by mouth 2 (two) times daily. 10/15/21  Yes Paseda, Dewaine Conger, FNP  atorvastatin (LIPITOR) 20 MG tablet TAKE ONE TABLET BY MOUTH ONCE DAILY. 04/12/21  Yes Fayrene Helper, MD  carvedilol (COREG) 6.25 MG tablet TAKE (1) TABLET BY MOUTH TWICE DAILY. Patient taking differently: Take 6.25 mg by mouth 2 (two) times daily with a meal. 10/05/21  Yes Fay Records, MD  cephALEXin (KEFLEX) 500 MG capsule Take 1 capsule (500 mg total) by mouth 2 (two) times daily for 5 days. 10/22/21 10/27/21 Yes Tonye Pearson, PA-C  chlorthalidone (HYGROTON) 25 MG tablet Take 12.5 mg by mouth daily. 05/23/21  Yes [provider]   ipratropium-albuterol (DUONEB) 0.5-2.5 (3) MG/3ML SOLN Take 3 mLs by nebulization every 6 (six) hours as needed. 08/11/21  Yes Lindell Spar, MD  levothyroxine (SYNTHROID) 88 MCG tablet TAKE (1) TABLET BY MOUTH EACH MORNING. Patient taking differently: Take 88 mcg by mouth daily before breakfast. 07/12/21  Yes Patel, Colin Broach, MD  lidocaine (XYLOCAINE) 2 % solution Use as directed 10 mLs in the mouth or throat as needed for mouth pain. 10/02/21  Yes Godfrey Pick, MD  mirtazapine (REMERON) 7.5 MG tablet TAKE (1) TABLET BY MOUTH AT BEDTIME. Patient taking differently: Take 7.5 mg by mouth at bedtime. 08/02/21  Yes Lindell Spar, MD  nystatin (MYCOSTATIN) 100000 UNIT/ML suspension Take 5 mLs (500,000 Units total) by mouth 4 (four) times daily. 10/17/21  Yes Lindell Spar, MD  ondansetron (ZOFRAN) 4 MG tablet TAKE 1 TABLET BY MOUTH THREE TIMES AS NEEDED FOR NAUSEA. Patient taking differently: Take 4 mg by mouth every 8 (eight) hours as needed for nausea or vomiting. 03/09/21  Yes Lindell Spar, MD  oxyCODONE-acetaminophen (PERCOCET/ROXICET) 5-325 MG tablet Take 1 tablet by mouth every 6 (six) hours as needed.   Yes [provider]  pantoprazole (PROTONIX) 40 MG tablet TAKE (1) TABLET BY MOUTH ONCE DAILY. Patient taking differently: Take 40 mg by mouth daily. 07/12/21  Yes Lindell Spar, MD  omeprazole (PRILOSEC) 40 MG capsule TAKE (1) CAPSULE BY MOUTH TWICE DAILY FOR REFLUX. Patient taking differently: Take 40 mg by mouth  daily. 05/31/21   Lindell Spar, MD  UNABLE TO FIND Wheelchair (small) - OA and physical deconditioning (M19.90, R53.81) 12/28/20   Lindell Spar, MD  UNABLE TO FIND Hand held shower head with long cord 01/25/21   Lindell Spar, MD      Allergies    Hydralazine, Nalbuphine, Paroxetine, Sulfa antibiotics, and Venlafaxine    Review of Systems   Review of Systems  Gastrointestinal:  Positive for abdominal pain.    Physical Exam Updated Vital Signs BP (!) 163/75  (BP Location: Left Arm)   Pulse (!) 52   Temp (!) 97.4 F (36.3 C) (Axillary)   Resp 20   Ht 5' (1.524 m)   Wt 47.2 kg   SpO2 97%   BMI 20.31 kg/m  Physical Exam Vitals and nursing note reviewed.  Constitutional:      General: She is not in acute distress.    Appearance: She is not ill-appearing.  HENT:     Head: Atraumatic.  Eyes:     Conjunctiva/sclera: Conjunctivae normal.  Cardiovascular:     Rate and Rhythm: Normal rate and regular rhythm.     Pulses: Normal pulses.     Heart sounds: No murmur heard. Pulmonary:     Effort: Pulmonary effort is normal. No respiratory distress.     Breath sounds: Normal breath sounds.  Abdominal:     General: Abdomen is flat. There is no distension.     Palpations: Abdomen is soft.     Tenderness: There is abdominal tenderness in the suprapubic area. There is no right CVA tenderness.  Musculoskeletal:        General: Normal range of motion.     Cervical back: Normal range of motion.  Skin:    General: Skin is warm and dry.     Capillary Refill: Capillary refill takes less than 2 seconds.  Neurological:     General: No focal deficit present.     Mental Status: She is alert.  Psychiatric:        Mood and Affect: Mood normal.     ED Results / Procedures / Treatments   Labs (all labs ordered are listed, but only abnormal results are displayed) Labs Reviewed  URINALYSIS, ROUTINE W REFLEX MICROSCOPIC - Abnormal; Notable for the following components:      Result Value   Hgb urine dipstick SMALL (*)    Protein, ur 30 (*)    Leukocytes,Ua TRACE (*)    All other components within normal limits  COMPREHENSIVE METABOLIC PANEL - Abnormal; Notable for the following components:   CO2 20 (*)    Glucose, Bld 100 (*)    BUN 33 (*)    Creatinine, Ser 2.22 (*)    Albumin 3.0 (*)    AST 12 (*)    GFR, Estimated 21 (*)    All other components within normal limits  CBC WITH DIFFERENTIAL/PLATELET - Abnormal; Notable for the following  components:   RBC 3.19 (*)    Hemoglobin 9.5 (*)    HCT 29.7 (*)    RDW 16.7 (*)    All other components within normal limits  URINE CULTURE    EKG None  Radiology No results found.  Procedures Procedures    Medications Ordered in ED Medications  cephALEXin (KEFLEX) capsule 500 mg (500 mg Oral Given 10/22/21 1826)    ED Course/ Medical Decision Making/ A&P  Medical Decision Making Amount and/or Complexity of Data Reviewed Labs: ordered.  Risk Prescription drug management.   Social determinants of health:  Social History   Socioeconomic History   Marital status: Married    Spouse name: Not on file   Number of children: 4   Years of education: Not on file   Highest education level: Not on file  Occupational History   Not on file  Tobacco Use   Smoking status: Former    Packs/day: 1.00    Years: 55.00    Total pack years: 55.00    Types: Cigarettes    Quit date: 09/15/2010    Years since quitting: 11.1   Smokeless tobacco: Never  Vaping Use   Vaping Use: Never used  Substance and Sexual Activity   Alcohol use: No    Alcohol/week: 0.0 standard drinks of alcohol   Drug use: No   Sexual activity: Not Currently  Other Topics Concern   Not on file  Social History Narrative   Not on file   Social Determinants of Health   Financial Resource Strain: Low Risk  (09/06/2020)   Overall Financial Resource Strain (CARDIA)    Difficulty of Paying Living Expenses: Not hard at all  Food Insecurity: No Food Insecurity (09/06/2020)   Hunger Vital Sign    Worried About Running Out of Food in the Last Year: Never true    [place] in the Last Year: Never true  Transportation Needs: No Transportation Needs (09/06/2020)   PRAPARE - Hydrologist (Medical): No    Lack of Transportation (Non-Medical): No  Physical Activity: Inactive (09/06/2020)   Exercise Vital Sign    Days of Exercise per Week: 0 days     Minutes of Exercise per Session: 0 min  Stress: No Stress Concern Present (09/06/2020)   [place]    Feeling of Stress : Not at all  Social Connections: Moderately Isolated (09/06/2020)   Social Connection and Isolation Panel [NHANES]    Frequency of Communication with Friends and Family: More than three times a week    Frequency of Social Gatherings with Friends and Family: More than three times a week    Attends Religious Services: Never    Marine scientist or Organizations: No    Attends Archivist Meetings: Never    Marital Status: Married  Human resources officer Violence: Not At Risk (09/06/2020)   Humiliation, Afraid, Rape, and Kick questionnaire    Fear of Current or Ex-Partner: No    Emotionally Abused: No    Physically Abused: No    Sexually Abused: No     Initial impression:  This patient presents to the ED for concern of dysuria, this involves an extensive number of treatment options, and is a complaint that carries with it a high risk of complications and morbidity.     Comorbidities affecting care:  Per HPI  Additional history obtained: Son - denies changes in mental status  Lab Tests  I Ordered, reviewed, and interpreted labs and EKG.  The pertinent results include:  CMP without acute findings, creatinine at baseline No leukocytosis Mild urinary tract infection on UA   Medicines ordered and prescription drug management:  I ordered medication including: Keflex 500 mg p.o. Reevaluation of the patient after these medicines showed that the patient stayed the same I have reviewed the patients home medicines and have made adjustments as needed  ED Course/Re-evaluation: Patient is overall well-appearing and in no acute distress.  Vitals without significant abnormality.  She is satting well on room air.  Patient is pleasant and states that her only complaint is the dysuria and little  bit of suprapubic tenderness.She does have very mild suprapubic tenderness to palpation without CVA tenderness.  Physical exam was otherwise benign.  Labs are overall reassuring although there was evidence of a small urinary tract infection on UA.  Given that she is overall well-appearing and labs and vitals are without concern for underlying sepsis, patient can be treated with p.o. antibiotics outpatient.  She was given her first dose here in the emergency department.  Strict return precautions were discussed.  Culture was sent out and patient expresses understanding and is amenable to this plan.  Disposition:  After consideration of the diagnostic results, physical exam, history and the patients response to treatment feel that the patent would benefit from discharge.   Cystitis: Plan and management as described above. Discharged home in good condition.   Final Clinical Impression(s) / ED Diagnoses Final diagnoses:  Cystitis    Rx / DC Orders ED Discharge Orders          Ordered    cephALEXin (KEFLEX) 500 MG capsule  2 times daily        10/22/21 1816              Rodena Piety 10/22/21 1917    Milton Ferguson, MD 10/23/21 1110

## 2021-10-24 LAB — URINE CULTURE

## 2021-10-24 NOTE — Telephone Encounter (Signed)
Patient went to ER Saturday and was given another antibiotic   Lab results given

## 2021-10-26 ENCOUNTER — Other Ambulatory Visit: Payer: Self-pay | Admitting: Internal Medicine

## 2021-10-26 DIAGNOSIS — E039 Hypothyroidism, unspecified: Secondary | ICD-10-CM

## 2021-10-28 ENCOUNTER — Telehealth: Payer: Self-pay

## 2021-10-28 NOTE — Telephone Encounter (Signed)
Please advice? Pt was seen at ED on 07/29.

## 2021-10-28 NOTE — Telephone Encounter (Signed)
2nd time taking antibiotic and still having a lot of burning when urinates.  Patient still waiting to hear back on the urine labs from 07.29.2023.  What shoud the patient do?

## 2021-10-28 NOTE — Telephone Encounter (Signed)
Called pt back left vm asking for a return call.

## 2021-10-28 NOTE — Telephone Encounter (Signed)
Please ask her if she can bring leave [place] a urine

## 2021-10-31 ENCOUNTER — Ambulatory Visit (INDEPENDENT_AMBULATORY_CARE_PROVIDER_SITE_OTHER): Payer: Medicare Other | Admitting: Family Medicine

## 2021-10-31 ENCOUNTER — Encounter: Payer: Self-pay | Admitting: Family Medicine

## 2021-10-31 VITALS — BP 154/72 | HR 50 | Ht 60.0 in | Wt 104.0 lb

## 2021-10-31 DIAGNOSIS — N390 Urinary tract infection, site not specified: Secondary | ICD-10-CM | POA: Diagnosis not present

## 2021-10-31 DIAGNOSIS — N3090 Cystitis, unspecified without hematuria: Secondary | ICD-10-CM

## 2021-10-31 LAB — POCT URINALYSIS DIP (CLINITEK)
Bilirubin, UA: NEGATIVE
Glucose, UA: NEGATIVE mg/dL
Ketones, POC UA: NEGATIVE mg/dL
Leukocytes, UA: NEGATIVE
Nitrite, UA: NEGATIVE
POC PROTEIN,UA: 30 — AB
Spec Grav, UA: 1.015 (ref 1.010–1.025)
Urobilinogen, UA: 0.2 E.U./dL
pH, UA: 5 (ref 5.0–8.0)

## 2021-10-31 MED ORDER — CIPROFLOXACIN HCL 500 MG PO TABS: 500.0000 mg | ORAL_TABLET | Freq: Two times a day (BID) | ORAL | 0 refills | Status: AC

## 2021-10-31 NOTE — Progress Notes (Signed)
Established Patient Office Visit  Subjective:  Patient ID: [name], female    DOB: 1936/01/09  Age: 86 y.o. MRN: [id number]  CC:  Chief Complaint  Patient presents with   Urinary Tract Infection    Following up from ed visit from 07/29. Pt c/o burning, pain on her side, has been on three different medications has not been able to clear. Needs blood test today.     HPI [name] is a 86 y.o. female with past medical history of ASCVD, primary hypertension presents with c/o of urinary symptoms.  Recurrent Cystitis: She was seen and treated in the ED on 7/29 for UTI and treated with keflex for 5 days.Her symptoms have not relented and she c/o of burning, urgency and frequency. She denies fever, chills and hematuria.   Past Medical History:  Diagnosis Date   Anxiety disorder, unspecified    Arteriosclerotic cardiovascular disease (ASCVD) 2006   Nonobstructive; < 50% lesions on cath 2002; negative stress nuclear study in 10/2004   Asthma    Chest pain, unspecified    Chronic kidney disease, unspecified    Chronic obstructive pulmonary disease, unspecified (HCC)    Chronic pelvic pain in female    Colitis due to Clostridium difficile 1994   1994   COPD (chronic obstructive pulmonary disease) (HCC)    Depression with anxiety    Diarrhea, unspecified    Disorder of thyroid, unspecified    Gastro-esophageal reflux disease without esophagitis    Gastroesophageal reflux disease    Hyperlipidemia    Hyperlipidemia, unspecified    Hypertension    Hypertensive chronic kidney disease with stage 1 through stage 4 chronic kidney disease, or unspecified chronic kidney disease    Hyperthyroidism    Hypothyroidism    Hypothyroidism, unspecified    Kidney disease, chronic, stage III (moderate, EGFR 30-59 ml/min) (HCC)    Lower abdominal pain, unspecified    Major depressive disorder, single episode, severe without psychotic features (HCC)    Major depressive disorder, single  episode, unspecified    Nausea    Other postherpetic nervous system involvement    Pelvic and perineal pain    Rash and other nonspecific skin eruption    S/P colonoscopy 2007   few diverticula, otherwise nl   S/P colonoscopy 12/13/10   rectal, cecal polyp, left-sided diverticulosis, hyperplastic. ?anal fissure? treated empirically   S/P endoscopy 2009   linear esophageal erosions   S/P endoscopy 05/10/10   Question island of salmon-colored epithelium in distal esophagus ; no Barrett's.    Shingles    Tobacco abuse, in remission    55-pack-year consumption; discontinued 08/2010   Unspecified asthma, uncomplicated    Unspecified osteoarthritis, unspecified site    Urinary tract infection, site not specified     Past Surgical History:  Procedure Laterality Date   ABDOMINAL HYSTERECTOMY     BREAST LUMPECTOMY     CATARACT EXTRACTION     CHOLECYSTECTOMY  1962   COLONOSCOPY  12/13/2010   Left-sided diverticulosis.  Cecal polyp, status post hot snare polypectomy/ Diminutive rectal polyp, status post cold biopsy removal tender/painful anal canal, ? occult anal fissure- Not visualized   COLONOSCOPY N/A 10/14/2015   Diverticulosis   ESOPHAGOGASTRODUODENOSCOPY  05/10/10   benign mucosa with mild chronic inflammation.   ESOPHAGOGASTRODUODENOSCOPY (EGD) WITH PROPOFOL N/A 02/13/2019   Procedure: ESOPHAGOGASTRODUODENOSCOPY (EGD) WITH PROPOFOL;  Surgeon: Corbin Ade, MD;  Location: [facility];  Service: Endoscopy;  Laterality: N/A;  3:00pm   FLEXIBLE  SIGMOIDOSCOPY  12/29/2011   Procedure: FLEXIBLE SIGMOIDOSCOPY;  Surgeon: Rogene Houston, MD;  Location: [facility];  Service: Endoscopy;  Laterality: N/A;  200-Ann notified pt to be here @ 9:39   NISSEN FUNDOPLICATION     YAG LASER APPLICATION Left 0/30/0923   Procedure: YAG LASER APPLICATION;  Surgeon: Williams Che, MD;  Location: [facility];  Service: Ophthalmology;  Laterality: Left;    Family History  Problem Relation Age of Onset    Diabetes Mother    Alzheimer's disease Mother    Cancer Mother    Heart disease Father    Cancer Sister    Heart disease Brother        Also mother, Father, brother and 2 sons   Cancer Son        testicular cancer    Aneurysm Son    Diabetes Son    Hypertension Brother        also Sister x2   Colon cancer Neg Hx     Social History   Socioeconomic History   Marital status: Married    Spouse name: Not on file   Number of children: 4   Years of education: Not on file   Highest education level: Not on file  Occupational History   Not on file  Tobacco Use   Smoking status: Former    Packs/day: 1.00    Years: 55.00    Total pack years: 55.00    Types: Cigarettes    Quit date: 09/15/2010    Years since quitting: 11.1   Smokeless tobacco: Never  Vaping Use   Vaping Use: Never used  Substance and Sexual Activity   Alcohol use: No    Alcohol/week: 0.0 standard drinks of alcohol   Drug use: No   Sexual activity: Not Currently  Other Topics Concern   Not on file  Social History Narrative   Not on file   Social Determinants of Health   Financial Resource Strain: Low Risk  (09/06/2020)   Overall Financial Resource Strain (CARDIA)    Difficulty of Paying Living Expenses: Not hard at all  Food Insecurity: No Food Insecurity (09/06/2020)   Hunger Vital Sign    Worried About Running Out of Food in the Last Year: Never true    [place] in the Last Year: Never true  Transportation Needs: No Transportation Needs (09/06/2020)   PRAPARE - Hydrologist (Medical): No    Lack of Transportation (Non-Medical): No  Physical Activity: Inactive (09/06/2020)   Exercise Vital Sign    Days of Exercise per Week: 0 days    Minutes of Exercise per Session: 0 min  Stress: No Stress Concern Present (09/06/2020)   [place]    Feeling of Stress : Not at all  Social Connections: Moderately  Isolated (09/06/2020)   Social Connection and Isolation Panel [NHANES]    Frequency of Communication with Friends and Family: More than three times a week    Frequency of Social Gatherings with Friends and Family: More than three times a week    Attends Religious Services: Never    Marine scientist or Organizations: No    Attends Archivist Meetings: Never    Marital Status: Married  Human resources officer Violence: Not At Risk (09/06/2020)   Humiliation, Afraid, Rape, and Kick questionnaire    Fear of Current or Ex-Partner: No    Emotionally Abused: No  Physically Abused: No    Sexually Abused: No    Outpatient Medications Prior to Visit  Medication Sig Dispense Refill   albuterol (VENTOLIN HFA) 108 (90 Base) MCG/ACT inhaler Inhale 1-2 puffs into the lungs every 6 (six) hours as needed for wheezing or shortness of breath. 8 g 5   atorvastatin (LIPITOR) 20 MG tablet TAKE ONE TABLET BY MOUTH ONCE DAILY. 90 tablet 0   carvedilol (COREG) 6.25 MG tablet TAKE (1) TABLET BY MOUTH TWICE DAILY. (Patient taking differently: Take 6.25 mg by mouth 2 (two) times daily with a meal.) 30 tablet 0   chlorthalidone (HYGROTON) 25 MG tablet Take 12.5 mg by mouth daily.     ipratropium-albuterol (DUONEB) 0.5-2.5 (3) MG/3ML SOLN Take 3 mLs by nebulization every 6 (six) hours as needed. 360 mL 2   levothyroxine (SYNTHROID) 88 MCG tablet TAKE (1) TABLET BY MOUTH EACH MORNING. 90 tablet 0   lidocaine (XYLOCAINE) 2 % solution Use as directed 10 mLs in the mouth or throat as needed for mouth pain. 100 mL 0   mirtazapine (REMERON) 7.5 MG tablet TAKE (1) TABLET BY MOUTH AT BEDTIME. (Patient taking differently: Take 7.5 mg by mouth at bedtime.) 30 tablet 5   nystatin (MYCOSTATIN) 100000 UNIT/ML suspension Take 5 mLs (500,000 Units total) by mouth 4 (four) times daily. 60 mL 0   omeprazole (PRILOSEC) 40 MG capsule TAKE (1) CAPSULE BY MOUTH TWICE DAILY FOR REFLUX. (Patient taking differently: Take 40 mg by  mouth daily.) 180 capsule 0   ondansetron (ZOFRAN) 4 MG tablet TAKE 1 TABLET BY MOUTH THREE TIMES AS NEEDED FOR NAUSEA. (Patient taking differently: Take 4 mg by mouth every 8 (eight) hours as needed for nausea or vomiting.) 90 tablet 0   oxyCODONE-acetaminophen (PERCOCET/ROXICET) 5-325 MG tablet Take 1 tablet by mouth every 6 (six) hours as needed.     pantoprazole (PROTONIX) 40 MG tablet TAKE (1) TABLET BY MOUTH ONCE DAILY. (Patient taking differently: Take 40 mg by mouth daily.) 90 tablet 0   UNABLE TO FIND Wheelchair (small) - OA and physical deconditioning (M19.90, R53.81) 1 each 0   UNABLE TO FIND Hand held shower head with long cord 1 each 0   amoxicillin-clavulanate (AUGMENTIN) 500-125 MG tablet Take 1 tablet (500 mg total) by mouth 2 (two) times daily. (Patient not taking: Reported on 10/31/2021) 6 tablet 0   No facility-administered medications prior to visit.    Allergies  Allergen Reactions   Hydralazine Other (See Comments)    Breathing   Nalbuphine Rash   Paroxetine Itching   Sulfa Antibiotics Other (See Comments)    Childhood reaction.   Venlafaxine Itching    ROS Review of Systems  Constitutional:  Negative for chills and fever.  Cardiovascular:  Negative for chest pain and palpitations.  Genitourinary:  Positive for dysuria, frequency, pelvic pain and urgency.  Neurological:  Negative for dizziness and headaches.      Objective:    Physical Exam HENT:     Head: Normocephalic.  Cardiovascular:     Rate and Rhythm: Normal rate and regular rhythm.     Pulses: Normal pulses.     Heart sounds: Normal heart sounds.  Pulmonary:     Effort: Pulmonary effort is normal.     Breath sounds: Normal breath sounds.  Abdominal:     Tenderness: There is no right CVA tenderness or left CVA tenderness.     BP (!) 154/72 (BP Location: Left Arm)   Pulse (!) 50   Ht  5' (1.524 m)   Wt 104 lb (47.2 kg)   SpO2 95%   BMI 20.31 kg/m  Wt Readings from Last 3 Encounters:   10/31/21 104 lb (47.2 kg)  10/22/21 104 lb (47.2 kg)  10/17/21 106 lb (48.1 kg)    Lab Results  Component Value Date   TSH 2.330 10/17/2021   Lab Results  Component Value Date   WBC 5.8 10/22/2021   HGB 9.5 (L) 10/22/2021   HCT 29.7 (L) 10/22/2021   MCV 93.1 10/22/2021   PLT 246 10/22/2021   Lab Results  Component Value Date   NA 136 10/22/2021   K 4.7 10/22/2021   CO2 20 (L) 10/22/2021   GLUCOSE 100 (H) 10/22/2021   BUN 33 (H) 10/22/2021   CREATININE 2.22 (H) 10/22/2021   BILITOT 0.4 10/22/2021   ALKPHOS 55 10/22/2021   AST 12 (L) 10/22/2021   ALT 11 10/22/2021   PROT 6.6 10/22/2021   ALBUMIN 3.0 (L) 10/22/2021   CALCIUM 9.1 10/22/2021   ANIONGAP 5 10/22/2021   EGFR 22 (L) 10/17/2021   Lab Results  Component Value Date   CHOL 164 09/17/2018   Lab Results  Component Value Date   HDL 44 09/17/2018   Lab Results  Component Value Date   LDLCALC 95 09/17/2018   Lab Results  Component Value Date   TRIG 145 09/17/2018   Lab Results  Component Value Date   CHOLHDL 2.9 07/15/2008   Lab Results  Component Value Date   HGBA1C 5.3 06/14/2020      Assessment & Plan:   Problem List Items Addressed This Visit       Genitourinary   Recurrent cystitis - Primary    She was seen and treated in the ED on 7/29 for UTI and treated with keflex for 5 days Her symptoms have not relented and she c/o of burning, urgency and frequency She denies fever, chills and hematuria  Will treat with ciprofloxacin for 10 days Please complete the full course of the antibiotics   You can help prevent UTIs by doing the following:  -Avoid holding urine for prolonged periods; this stretches the bladder and causes bacteria to form because bacteria like warm and wet environments to grow -Empty the bladder as soon as the need arises.  -Empty your bladder soon after intercourse.  -Take showers instead of baths -Wipe front to back; doing so after urinating and after a bowel movement  helps prevent bacteria in the anal region from spreading to the vagina and urethra. -Also, drink a full glass of water to help flush bacteria.       Other Visit Diagnoses     Recurrent UTI       Relevant Medications   ciprofloxacin (CIPRO) 500 MG tablet   Other Relevant Orders   POCT URINALYSIS DIP (CLINITEK) (Completed)       Meds ordered this encounter  Medications   ciprofloxacin (CIPRO) 500 MG tablet    Sig: Take 1 tablet (500 mg total) by mouth 2 (two) times daily for 10 days.    Dispense:  20 tablet    Refill:  0    Follow-up: Return if symptoms worsen or fail to improve.    Alvira Monday, FNP

## 2021-10-31 NOTE — Telephone Encounter (Signed)
Pt in for appt on 08/07.

## 2021-10-31 NOTE — Patient Instructions (Signed)
I appreciate the opportunity to provide care to you today!    Please pick up your prescription at the pharmacy and complete the full course of antibiotics   You can help prevent UTIs by doing the following:  -Avoid holding urine for prolonged periods; this stretches the bladder and causes bacteria to form because bacteria like warm and wet environments to grow -Empty the bladder as soon as the need arises.  -Empty your bladder soon after intercourse.  -Take showers instead of baths -Wipe front to back; doing so after urinating and after a bowel movement helps prevent bacteria in the anal region from spreading to the vagina and urethra. -Also, drink a full glass of water to help flush bacteria.    Please continue to a heart-healthy diet and increase your physical activities. Try to exercise for 4mns at least three times a week.      It was a pleasure to see you and I look forward to continuing to work together on your health and well-being. Please do not hesitate to call the office if you need care or have questions about your care.   Have a wonderful day and week. With Gratitude, GAlvira MondayMSN, FNP-BC

## 2021-10-31 NOTE — Assessment & Plan Note (Signed)
She was seen and treated in the ED on 7/29 for UTI and treated with keflex for 5 days Her symptoms have not relented and she c/o of burning, urgency and frequency She denies fever, chills and hematuria  Will treat with ciprofloxacin for 10 days Please complete the full course of the antibiotics   You can help prevent UTIs by doing the following:  -Avoid holding urine for prolonged periods; this stretches the bladder and causes bacteria to form because bacteria like warm and wet environments to grow -Empty the bladder as soon as the need arises.  -Empty your bladder soon after intercourse.  -Take showers instead of baths -Wipe front to back; doing so after urinating and after a bowel movement helps prevent bacteria in the anal region from spreading to the vagina and urethra. -Also, drink a full glass of water to help flush bacteria.

## 2021-11-07 DIAGNOSIS — E871 Hypo-osmolality and hyponatremia: Secondary | ICD-10-CM | POA: Diagnosis not present

## 2021-11-07 DIAGNOSIS — N390 Urinary tract infection, site not specified: Secondary | ICD-10-CM | POA: Diagnosis not present

## 2021-11-07 DIAGNOSIS — N2581 Secondary hyperparathyroidism of renal origin: Secondary | ICD-10-CM | POA: Diagnosis not present

## 2021-11-07 DIAGNOSIS — N184 Chronic kidney disease, stage 4 (severe): Secondary | ICD-10-CM | POA: Diagnosis not present

## 2021-11-07 DIAGNOSIS — R809 Proteinuria, unspecified: Secondary | ICD-10-CM | POA: Diagnosis not present

## 2021-11-07 DIAGNOSIS — E039 Hypothyroidism, unspecified: Secondary | ICD-10-CM | POA: Diagnosis not present

## 2021-11-07 DIAGNOSIS — N179 Acute kidney failure, unspecified: Secondary | ICD-10-CM | POA: Diagnosis not present

## 2021-11-07 DIAGNOSIS — I129 Hypertensive chronic kidney disease with stage 1 through stage 4 chronic kidney disease, or unspecified chronic kidney disease: Secondary | ICD-10-CM | POA: Diagnosis not present

## 2021-11-12 DIAGNOSIS — I509 Heart failure, unspecified: Secondary | ICD-10-CM | POA: Diagnosis not present

## 2021-11-14 ENCOUNTER — Other Ambulatory Visit: Payer: Self-pay | Admitting: Internal Medicine

## 2021-11-14 DIAGNOSIS — M1990 Unspecified osteoarthritis, unspecified site: Secondary | ICD-10-CM | POA: Diagnosis not present

## 2021-11-14 DIAGNOSIS — R5381 Other malaise: Secondary | ICD-10-CM | POA: Diagnosis not present

## 2021-11-14 DIAGNOSIS — K219 Gastro-esophageal reflux disease without esophagitis: Secondary | ICD-10-CM

## 2021-11-14 DIAGNOSIS — M170 Bilateral primary osteoarthritis of knee: Secondary | ICD-10-CM | POA: Diagnosis not present

## 2021-11-23 ENCOUNTER — Telehealth: Payer: Self-pay | Admitting: *Deleted

## 2021-11-23 NOTE — Patient Outreach (Signed)
  Care Coordination   Initial Visit Note   11/23/2021 Name: [name] MRN: [id number] DOB: 08-25-1935  [name] is a 86 y.o. year old female who sees Lindell Spar, MD for primary care. I spoke with  Hyman Hopes son by phone today.  What matters to the patients health and wellness today?  No concerns expressed. RN discussed services [facility] services, RN, SW, and Pharmacist. Patient declined services.   Goals Addressed   None     SDOH assessments and interventions completed:  Yes     Care Coordination Interventions Activated:  Yes  Care Coordination Interventions:  No, not indicated   Follow up plan: No further intervention required.   Encounter Outcome:  Pt. [place] [facility] [phone]

## 2021-12-05 ENCOUNTER — Other Ambulatory Visit: Payer: Self-pay | Admitting: Internal Medicine

## 2021-12-09 DIAGNOSIS — H524 Presbyopia: Secondary | ICD-10-CM | POA: Diagnosis not present

## 2021-12-09 DIAGNOSIS — H353134 Nonexudative age-related macular degeneration, bilateral, advanced atrophic with subfoveal involvement: Secondary | ICD-10-CM | POA: Diagnosis not present

## 2021-12-13 DIAGNOSIS — I509 Heart failure, unspecified: Secondary | ICD-10-CM | POA: Diagnosis not present

## 2021-12-20 DIAGNOSIS — G894 Chronic pain syndrome: Secondary | ICD-10-CM | POA: Diagnosis not present

## 2021-12-20 DIAGNOSIS — M130 Polyarthritis, unspecified: Secondary | ICD-10-CM | POA: Diagnosis not present

## 2021-12-20 DIAGNOSIS — Z79891 Long term (current) use of opiate analgesic: Secondary | ICD-10-CM | POA: Diagnosis not present

## 2021-12-22 ENCOUNTER — Other Ambulatory Visit: Payer: Self-pay | Admitting: Family Medicine

## 2022-01-03 ENCOUNTER — Other Ambulatory Visit: Payer: Self-pay | Admitting: Internal Medicine

## 2022-01-18 ENCOUNTER — Other Ambulatory Visit: Payer: Self-pay | Admitting: Internal Medicine

## 2022-01-18 DIAGNOSIS — E039 Hypothyroidism, unspecified: Secondary | ICD-10-CM

## 2022-01-19 ENCOUNTER — Telehealth: Payer: Self-pay

## 2022-01-19 NOTE — Telephone Encounter (Signed)
called

## 2022-01-19 NOTE — Telephone Encounter (Signed)
Call the patient and explain, patient daughter in law does not understand

## 2022-01-19 NOTE — Telephone Encounter (Signed)
Please have them call cardiology office this is filled by that office

## 2022-01-19 NOTE — Telephone Encounter (Signed)
Patient called need med refill carvedilol (COREG) 6.25 MG tablet [[id number]]   Can patient get more than 15 pills on prescription. She takes bid.    Pharmacy  [place], [place] - [place]  [address], [place] [address]  Phone:  [phone]  Fax:  [phone]

## 2022-01-23 ENCOUNTER — Other Ambulatory Visit: Payer: Self-pay | Admitting: Internal Medicine

## 2022-01-25 ENCOUNTER — Encounter: Payer: Self-pay | Admitting: Internal Medicine

## 2022-01-25 ENCOUNTER — Ambulatory Visit (INDEPENDENT_AMBULATORY_CARE_PROVIDER_SITE_OTHER): Payer: Medicare Other | Admitting: Internal Medicine

## 2022-01-25 VITALS — BP 128/84 | HR 65 | Resp 18

## 2022-01-25 DIAGNOSIS — K219 Gastro-esophageal reflux disease without esophagitis: Secondary | ICD-10-CM

## 2022-01-25 DIAGNOSIS — Z23 Encounter for immunization: Secondary | ICD-10-CM

## 2022-01-25 DIAGNOSIS — G4701 Insomnia due to medical condition: Secondary | ICD-10-CM | POA: Diagnosis not present

## 2022-01-25 DIAGNOSIS — N184 Chronic kidney disease, stage 4 (severe): Secondary | ICD-10-CM

## 2022-01-25 DIAGNOSIS — Z0001 Encounter for general adult medical examination with abnormal findings: Secondary | ICD-10-CM

## 2022-01-25 DIAGNOSIS — I10 Essential (primary) hypertension: Secondary | ICD-10-CM | POA: Diagnosis not present

## 2022-01-25 DIAGNOSIS — E039 Hypothyroidism, unspecified: Secondary | ICD-10-CM

## 2022-01-25 MED ORDER — MIRTAZAPINE 7.5 MG PO TABS: 7.5000 mg | ORAL_TABLET | Freq: Every day | ORAL | 5 refills | Status: DC

## 2022-01-25 MED ORDER — OMEPRAZOLE 40 MG PO CPDR: 40.0000 mg | DELAYED_RELEASE_CAPSULE | Freq: Every day | ORAL | 3 refills | Status: DC

## 2022-01-25 MED ORDER — CARVEDILOL 6.25 MG PO TABS: 6.2500 mg | ORAL_TABLET | Freq: Two times a day (BID) | ORAL | 1 refills | Status: DC

## 2022-01-25 NOTE — Progress Notes (Signed)
Established Patient Office Visit  Subjective:  Patient ID: [name], female    DOB: [birth year]  Age: 86 y.o. MRN: [id number]  CC:  Chief Complaint  Patient presents with   Medication Refill    Patient is needing carvedilol refilled this is refilled by cardiology but she has not seen cardiology will follow up with brittany strader   Annual Exam    HPI [name] is a 86 y.o. female with past medical history of hypertension, CKD stage IV, fibromyalgia, chronic pelvic pain, chronic constipation, hypothyroidism, osteoarthritis of knees and depression who presents for annual physical.  HTN: Her BP is well-controlled. Followed by nephrology.  Denies any chest pain, headache or dizziness currently.  She requests refill of Coreg.  CKD stage IV: Followed by nephrology.  She complains of mild dysuria recently.  Denies any fever, chills or hematuria currently.  She is due for urine testing for CKD and hyponatremia.  Hypothyroidism: Has been taking levothyroxine.  Denies any recent change in weight or appetite.   COPD: Her breathing has improved with Breztri and as needed albuterol now.  She denies any acute worsening of dyspnea or wheezing currently.  She complains of intermittent bloating/gaseous sensation.  Denies any diarrhea, melena or hematochezia.  Denies any nausea or vomiting currently.  She was given pantoprazole for GERD, but states that she had better response with omeprazole.     Past Medical History:  Diagnosis Date   Anxiety disorder, unspecified    Arteriosclerotic cardiovascular disease (ASCVD) 2006   Nonobstructive; < 50% lesions on cath 2002; negative stress nuclear study in 10/2004   Asthma    Chest pain, unspecified    Chronic kidney disease, unspecified    Chronic obstructive pulmonary disease, unspecified ([place])    Chronic pelvic pain in female    Colitis due to Clostridium difficile 1994   1994   COPD (chronic obstructive pulmonary disease) ([place])     Depression with anxiety    Diarrhea, unspecified    Disorder of thyroid, unspecified    Gastro-esophageal reflux disease without esophagitis    Gastroesophageal reflux disease    Hyperlipidemia    Hyperlipidemia, unspecified    Hypertension    Hypertensive chronic kidney disease with stage 1 through stage 4 chronic kidney disease, or unspecified chronic kidney disease    Hyperthyroidism    Hypothyroidism    Hypothyroidism, unspecified    Kidney disease, chronic, stage III (moderate, EGFR 30-59 ml/min) (HCC)    Lower abdominal pain, unspecified    Major depressive disorder, single episode, severe without psychotic features ([place])    Major depressive disorder, single episode, unspecified    Nausea    Other postherpetic nervous system involvement    Pelvic and perineal pain    Rash and other nonspecific skin eruption    S/P colonoscopy 2007   few diverticula, otherwise nl   S/P colonoscopy 12/13/10   rectal, cecal polyp, left-sided diverticulosis, hyperplastic. ?anal fissure? treated empirically   S/P endoscopy 2009   linear esophageal erosions   S/P endoscopy 05/10/10   Question island of salmon-colored epithelium in distal esophagus ; no Barrett's.    Shingles    Tobacco abuse, in remission    55-pack-year consumption; discontinued 08/2010   Unspecified asthma, uncomplicated    Unspecified osteoarthritis, unspecified site    Urinary tract infection, site not specified     Past Surgical History:  Procedure Laterality Date   ABDOMINAL HYSTERECTOMY     BREAST LUMPECTOMY  CATARACT EXTRACTION     CHOLECYSTECTOMY  1962   COLONOSCOPY  12/13/2010   Left-sided diverticulosis.  Cecal polyp, status post hot snare polypectomy/ Diminutive rectal polyp, status post cold biopsy removal tender/painful anal canal, ? occult anal fissure- Not visualized   COLONOSCOPY N/A 10/14/2015   Diverticulosis   ESOPHAGOGASTRODUODENOSCOPY  05/10/10   benign mucosa with mild chronic inflammation.    ESOPHAGOGASTRODUODENOSCOPY (EGD) WITH PROPOFOL N/A 02/13/2019   Procedure: ESOPHAGOGASTRODUODENOSCOPY (EGD) WITH PROPOFOL;  Surgeon: Daneil Dolin, MD;  [facility];  Service: Endoscopy;  Laterality: N/A;  3:00pm   FLEXIBLE SIGMOIDOSCOPY  12/29/2011   Procedure: FLEXIBLE SIGMOIDOSCOPY;  Surgeon: Rogene Houston, MD;  Location: [facility];  Service: Endoscopy;  Laterality: N/A;  200-Ann notified pt to be here @ 5:62   NISSEN FUNDOPLICATION     YAG LASER APPLICATION Left 5/63/8937   Procedure: YAG LASER APPLICATION;  Surgeon: Williams Che, MD;  Location: [facility];  Service: Ophthalmology;  Laterality: Left;    Family History  Problem Relation Age of Onset   Diabetes Mother    Alzheimer's disease Mother    Cancer Mother    Heart disease Father    Cancer Sister    Heart disease Brother        Also mother, Father, brother and 2 sons   Cancer Son        testicular cancer    Aneurysm Son    Diabetes Son    Hypertension Brother        also Sister x2   Colon cancer Neg Hx     Social History   Socioeconomic History   Marital status: Married    Spouse name: Not on file   Number of children: 4   Years of education: Not on file   Highest education level: Not on file  Occupational History   Not on file  Tobacco Use   Smoking status: Former    Packs/day: 1.00    Years: 55.00    Total pack years: 55.00    Types: Cigarettes    Quit date: 09/15/2010    Years since quitting: 11.3   Smokeless tobacco: Never  Vaping Use   Vaping Use: Never used  Substance and Sexual Activity   Alcohol use: No    Alcohol/week: 0.0 standard drinks of alcohol   Drug use: No   Sexual activity: Not Currently  Other Topics Concern   Not on file  Social History Narrative   Not on file   Social Determinants of Health   Financial Resource Strain: Low Risk  (09/06/2020)   Overall Financial Resource Strain (CARDIA)    Difficulty of Paying Living Expenses: Not hard at all  Food  Insecurity: No Food Insecurity (09/06/2020)   Hunger Vital Sign    Worried About Running Out of Food in the Last Year: Never true    [place] in the Last Year: Never true  Transportation Needs: No Transportation Needs (09/06/2020)   PRAPARE - Hydrologist (Medical): No    Lack of Transportation (Non-Medical): No  Physical Activity: Inactive (09/06/2020)   Exercise Vital Sign    Days of Exercise per Week: 0 days    Minutes of Exercise per Session: 0 min  Stress: No Stress Concern Present (09/06/2020)   [place]    Feeling of Stress : Not at all  Social Connections: Moderately Isolated (09/06/2020)   Social  Connection and Isolation Panel [NHANES]    Frequency of Communication with Friends and Family: More than three times a week    Frequency of Social Gatherings with Friends and Family: More than three times a week    Attends Religious Services: Never    Marine scientist or Organizations: No    Attends Archivist Meetings: Never    Marital Status: Married  Human resources officer Violence: Not At Risk (09/06/2020)   Humiliation, Afraid, Rape, and Kick questionnaire    Fear of Current or Ex-Partner: No    Emotionally Abused: No    Physically Abused: No    Sexually Abused: No    Outpatient Medications Prior to Visit  Medication Sig Dispense Refill   albuterol (VENTOLIN HFA) 108 (90 Base) MCG/ACT inhaler Inhale 1-2 puffs into the lungs every 6 (six) hours as needed for wheezing or shortness of breath. 8 g 5   atorvastatin (LIPITOR) 20 MG tablet TAKE ONE TABLET BY MOUTH ONCE DAILY. 90 tablet 0   chlorthalidone (HYGROTON) 25 MG tablet Take 12.5 mg by mouth daily.     ipratropium-albuterol (DUONEB) 0.5-2.5 (3) MG/3ML SOLN Take 3 mLs by nebulization every 6 (six) hours as needed. 360 mL 2   levothyroxine (SYNTHROID) 88 MCG tablet TAKE (1) TABLET BY MOUTH EACH MORNING. 90 tablet 0    lidocaine (XYLOCAINE) 2 % solution Use as directed 10 mLs in the mouth or throat as needed for mouth pain. 100 mL 0   nystatin (MYCOSTATIN) 100000 UNIT/ML suspension Take 5 mLs (500,000 Units total) by mouth 4 (four) times daily. 60 mL 0   ondansetron (ZOFRAN) 4 MG tablet TAKE 1 TABLET BY MOUTH THREE TIMES AS NEEDED FOR NAUSEA. 90 tablet 0   oxyCODONE-acetaminophen (PERCOCET/ROXICET) 5-325 MG tablet Take 1 tablet by mouth every 6 (six) hours as needed.     UNABLE TO FIND Wheelchair (small) - OA and physical deconditioning (M19.90, R53.81) 1 each 0   UNABLE TO FIND Hand held shower head with long cord 1 each 0   carvedilol (COREG) 6.25 MG tablet TAKE (1) TABLET BY MOUTH TWICE DAILY. 15 tablet 0   mirtazapine (REMERON) 7.5 MG tablet TAKE (1) TABLET BY MOUTH AT BEDTIME. (Patient taking differently: Take 7.5 mg by mouth at bedtime.) 30 tablet 5   omeprazole (PRILOSEC) 40 MG capsule TAKE (1) CAPSULE BY MOUTH TWICE DAILY FOR REFLUX. (Patient taking differently: Take 40 mg by mouth daily.) 180 capsule 0   pantoprazole (PROTONIX) 40 MG tablet TAKE (1) TABLET BY MOUTH ONCE DAILY. 90 tablet 0   No facility-administered medications prior to visit.    Allergies  Allergen Reactions   Hydralazine Other (See Comments)    Breathing   Nalbuphine Rash   Paroxetine Itching   Sulfa Antibiotics Other (See Comments)    Childhood reaction.   Venlafaxine Itching    ROS Review of Systems  Constitutional:  Positive for fatigue. Negative for appetite change, chills and fever.  HENT:  Negative for congestion, sinus pressure, sinus pain and sore throat.   Eyes:  Negative for pain and discharge.  Respiratory:  Positive for shortness of breath. Negative for cough.   Cardiovascular:  Positive for leg swelling. Negative for chest pain and palpitations.  Gastrointestinal:  Negative for abdominal pain, diarrhea, nausea and vomiting.  Endocrine: Negative for polydipsia and polyuria.  Genitourinary:  Negative for  dysuria, frequency and hematuria.  Musculoskeletal:  Positive for arthralgias and back pain. Negative for neck pain and neck stiffness.  Skin:  Negative for rash.  Neurological:  Negative for dizziness and weakness.  Psychiatric/Behavioral:  Positive for sleep disturbance. Negative for agitation and behavioral problems.       Objective:    Physical Exam Vitals reviewed.  Constitutional:      General: She is not in acute distress.    Appearance: She is not diaphoretic.     Comments: In wheelchair  HENT:     Head: Normocephalic and atraumatic.     Nose: Nose normal.     Mouth/Throat:     Mouth: Mucous membranes are moist.  Eyes:     General: No scleral icterus.    Extraocular Movements: Extraocular movements intact.  Cardiovascular:     Rate and Rhythm: Normal rate and regular rhythm.     Pulses: Normal pulses.     Heart sounds: Normal heart sounds. No murmur heard. Pulmonary:     Breath sounds: Normal breath sounds. No wheezing or rales.  Abdominal:     Palpations: Abdomen is soft.     Tenderness: There is no abdominal tenderness.  Musculoskeletal:        General: Swelling (Right knee, no warmth) present.     Cervical back: Neck supple. No tenderness.     Right lower leg: No edema.     Left lower leg: No edema.  Skin:    General: Skin is warm.     Findings: Erythema (Senile purpuric lesions (b/l UE)) present.  Neurological:     General: No focal deficit present.     Mental Status: She is alert and oriented to person, place, and time. Mental status is at baseline.     Sensory: No sensory deficit.     Motor: Weakness (3+ in b/l UE and LE) present.     Gait: Gait abnormal.  Psychiatric:        Mood and Affect: Mood normal.        Behavior: Behavior normal.     BP 128/84 (BP Location: Left Arm, Patient Position: Sitting, Cuff Size: Normal)   Pulse 65   Resp 18   SpO2 97%  Wt Readings from Last 3 Encounters:  10/31/21 104 lb (47.2 kg)  10/22/21 104 lb (47.2 kg)   10/17/21 106 lb (48.1 kg)    Lab Results  Component Value Date   TSH 2.330 10/17/2021   Lab Results  Component Value Date   WBC 5.8 10/22/2021   HGB 9.5 (L) 10/22/2021   HCT 29.7 (L) 10/22/2021   MCV 93.1 10/22/2021   PLT 246 10/22/2021   Lab Results  Component Value Date   NA 136 10/22/2021   K 4.7 10/22/2021   CO2 20 (L) 10/22/2021   GLUCOSE 100 (H) 10/22/2021   BUN 33 (H) 10/22/2021   CREATININE 2.22 (H) 10/22/2021   BILITOT 0.4 10/22/2021   ALKPHOS 55 10/22/2021   AST 12 (L) 10/22/2021   ALT 11 10/22/2021   PROT 6.6 10/22/2021   ALBUMIN 3.0 (L) 10/22/2021   CALCIUM 9.1 10/22/2021   ANIONGAP 5 10/22/2021   EGFR 22 (L) 10/17/2021   Lab Results  Component Value Date   CHOL 164 09/17/2018   Lab Results  Component Value Date   HDL 44 09/17/2018   Lab Results  Component Value Date   LDLCALC 95 09/17/2018   Lab Results  Component Value Date   TRIG 145 09/17/2018   Lab Results  Component Value Date   CHOLHDL 2.9 07/15/2008   Lab Results  Component Value Date  HGBA1C 5.3 06/14/2020      Assessment & Plan:   Problem List Items Addressed This Visit       Cardiovascular and Mediastinum   Primary hypertension    BP Readings from Last 1 Encounters:  01/25/22 128/84  Well-controlled with Coreg 6.25 mg twice daily and Chlorthalidone 12.5 mg QD Counseled for compliance with the medications Advised DASH diet Chronic pain and CKD also contributing to high BP Followed by Nephrologist      Relevant Medications   carvedilol (COREG) 6.25 MG tablet     Digestive   Gastro-esophageal reflux disease without esophagitis    Had better response with omeprazole, switched back to omeprazole Gas-X or Mylanta as needed for bloating      Relevant Medications   omeprazole (PRILOSEC) 40 MG capsule     Endocrine   Hypothyroidism    Continue Levothyroxine to 88 mcg QD Check TSH, free T4 Reviewed US thyroid      Relevant Medications   carvedilol (COREG)  6.25 MG tablet     Genitourinary   CKD (chronic kidney disease) stage 4, GFR 15-29 ml/min (HCC)    Follows up with nephrologist Follows up with hematooncologist for evaluation of elevated light chain levels Advised to follow renal diet as advised by nephrologist and dietitian Avoid nephrotoxic agents including NSAIDs Last BMP reviewed from nephrology visit - needs follow up visit        Other   Encounter for general adult medical examination with abnormal findings - Primary    Physical exam as documented. Recent blood tests reviewed. Gets routing blood work at Nephrology. Flu vaccine today. Advised to get Shingrix vaccine at local pharmacy.      Insomnia due to medical condition    On Remeron for insomnia and for weight gain purpose Her insomnia could be due to cognitive decline from dementia as well      Relevant Medications   mirtazapine (REMERON) 7.5 MG tablet   Other Visit Diagnoses     Gastroesophageal reflux disease without esophagitis       Relevant Medications   omeprazole (PRILOSEC) 40 MG capsule   Need for immunization against influenza       Relevant Orders   Flu Vaccine QUAD High Dose(Fluad) (Completed)       Meds ordered this encounter  Medications   omeprazole (PRILOSEC) 40 MG capsule    Sig: Take 1 capsule (40 mg total) by mouth daily.    Dispense:  90 capsule    Refill:  3   mirtazapine (REMERON) 7.5 MG tablet    Sig: Take 1 tablet (7.5 mg total) by mouth at bedtime.    Dispense:  30 tablet    Refill:  5   carvedilol (COREG) 6.25 MG tablet    Sig: Take 1 tablet (6.25 mg total) by mouth 2 (two) times daily with a meal.    Dispense:  180 tablet    Refill:  1    Follow-up: Return in about 6 months (around 07/26/2022) for HTN and CKD.    Lindell Spar, MD

## 2022-01-25 NOTE — Assessment & Plan Note (Signed)
BP Readings from Last 1 Encounters:  01/25/22 128/84   Well-controlled with Coreg 6.25 mg twice daily and Chlorthalidone 12.5 mg QD Counseled for compliance with the medications Advised DASH diet Chronic pain and CKD also contributing to high BP Followed by Nephrologist

## 2022-01-25 NOTE — Assessment & Plan Note (Signed)
Follows up with nephrologist Follows up with hematooncologist for evaluation of elevated light chain levels Advised to follow renal diet as advised by nephrologist and dietitian Avoid nephrotoxic agents including NSAIDs Last BMP reviewed from nephrology visit - needs follow up visit

## 2022-01-25 NOTE — Assessment & Plan Note (Signed)
On Remeron for insomnia and for weight gain purpose Her insomnia could be due to cognitive decline from dementia as well

## 2022-01-25 NOTE — Assessment & Plan Note (Signed)
Physical exam as documented. Recent blood tests reviewed. Gets routing blood work at Nephrology. Flu vaccine today. Advised to get Shingrix vaccine at local pharmacy.

## 2022-01-25 NOTE — Assessment & Plan Note (Signed)
Had better response with omeprazole, switched back to omeprazole Gas-X or Mylanta as needed for bloating

## 2022-01-25 NOTE — Assessment & Plan Note (Signed)
Continue Levothyroxine to 88 mcg QD Check TSH, free T4 Reviewed US thyroid

## 2022-01-25 NOTE — Patient Instructions (Addendum)
Please continue taking medications as prescribed.  Please continue to follow low salt diet.  Please take GasX or Mylanta as needed for bloating.  Please consider getting Shingrix and Tdap vaccine at local pharmacy.

## 2022-02-07 ENCOUNTER — Ambulatory Visit (INDEPENDENT_AMBULATORY_CARE_PROVIDER_SITE_OTHER): Payer: Medicare Other

## 2022-02-07 DIAGNOSIS — Z0000 Encounter for general adult medical examination without abnormal findings: Secondary | ICD-10-CM | POA: Diagnosis not present

## 2022-02-07 NOTE — Progress Notes (Signed)
Subjective:   [name] is a 86 y.o. female who presents for Medicare Annual (Subsequent) preventive examination. I connected with  [name] on 02/07/22 by a audio enabled telemedicine application and verified that I am speaking with the correct person using two identifiers.  Patient Location: Home  Provider Location: Office/Clinic  I discussed the limitations of evaluation and management by telemedicine. The patient expressed understanding and agreed to proceed.  Review of Systems           Objective:    There were no vitals filed for this visit. There is no height or weight on file to calculate BMI.     10/22/2021    3:25 PM 08/01/2021    4:47 PM 10/28/2020    2:00 PM 10/24/2020   11:54 AM 10/12/2020   11:06 AM 09/06/2020    1:16 PM 08/21/2020    1:57 PM  Advanced Directives  Does Patient Have a Medical Advance Directive? _0  No No  Would patient like information on creating a medical advance directive? No - Patient declined  No - Patient declined No - Patient declined No - Patient declined No - Patient declined     Current Medications (verified) Outpatient Encounter Medications as of 02/07/2022  Medication Sig   albuterol (VENTOLIN HFA) 108 (90 Base) MCG/ACT inhaler Inhale 1-2 puffs into the lungs every 6 (six) hours as needed for wheezing or shortness of breath.   atorvastatin (LIPITOR) 20 MG tablet TAKE ONE TABLET BY MOUTH ONCE DAILY.   carvedilol (COREG) 6.25 MG tablet Take 1 tablet (6.25 mg total) by mouth 2 (two) times daily with a meal.   chlorthalidone (HYGROTON) 25 MG tablet Take 12.5 mg by mouth daily.   ipratropium-albuterol (DUONEB) 0.5-2.5 (3) MG/3ML SOLN Take 3 mLs by nebulization every 6 (six) hours as needed.   levothyroxine (SYNTHROID) 88 MCG tablet TAKE (1) TABLET BY MOUTH EACH MORNING.   lidocaine (XYLOCAINE) 2 % solution Use as directed 10 mLs in the mouth or throat as needed for mouth pain.   mirtazapine (REMERON) 7.5 MG tablet Take 1  tablet (7.5 mg total) by mouth at bedtime.   nystatin (MYCOSTATIN) 100000 UNIT/ML suspension Take 5 mLs (500,000 Units total) by mouth 4 (four) times daily.   omeprazole (PRILOSEC) 40 MG capsule Take 1 capsule (40 mg total) by mouth daily.   ondansetron (ZOFRAN) 4 MG tablet TAKE 1 TABLET BY MOUTH THREE TIMES AS NEEDED FOR NAUSEA.   oxyCODONE-acetaminophen (PERCOCET/ROXICET) 5-325 MG tablet Take 1 tablet by mouth every 6 (six) hours as needed.   UNABLE TO FIND Wheelchair (small) - OA and physical deconditioning (M19.90, R53.81)   UNABLE TO FIND Hand held shower head with long cord   No facility-administered encounter medications on file as of 02/07/2022.    Allergies (verified) Hydralazine, Nalbuphine, Paroxetine, Sulfa antibiotics, and Venlafaxine   History: Past Medical History:  Diagnosis Date   Anxiety disorder, unspecified    Arteriosclerotic cardiovascular disease (ASCVD) 2006   Nonobstructive; < 50% lesions on cath 2002; negative stress nuclear study in 10/2004   Asthma    Chest pain, unspecified    Chronic kidney disease, unspecified    Chronic obstructive pulmonary disease, unspecified (HCC)    Chronic pelvic pain in female    Colitis due to Clostridium difficile 1994   1994   COPD (chronic obstructive pulmonary disease) ([place])    Depression with anxiety    Diarrhea, unspecified    Disorder of thyroid, unspecified  Gastro-esophageal reflux disease without esophagitis    Gastroesophageal reflux disease    Hyperlipidemia    Hyperlipidemia, unspecified    Hypertension    Hypertensive chronic kidney disease with stage 1 through stage 4 chronic kidney disease, or unspecified chronic kidney disease    Hyperthyroidism    Hypothyroidism    Hypothyroidism, unspecified    Kidney disease, chronic, stage III (moderate, EGFR 30-59 ml/min) (HCC)    Lower abdominal pain, unspecified    Major depressive disorder, single episode, severe without psychotic features ([place])    Major  depressive disorder, single episode, unspecified    Nausea    Other postherpetic nervous system involvement    Pelvic and perineal pain    Rash and other nonspecific skin eruption    S/P colonoscopy 2007   few diverticula, otherwise nl   S/P colonoscopy 12/13/10   rectal, cecal polyp, left-sided diverticulosis, hyperplastic. ?anal fissure? treated empirically   S/P endoscopy 2009   linear esophageal erosions   S/P endoscopy 05/10/10   Question island of salmon-colored epithelium in distal esophagus ; no Barrett's.    Shingles    Tobacco abuse, in remission    55-pack-year consumption; discontinued 08/2010   Unspecified asthma, uncomplicated    Unspecified osteoarthritis, unspecified site    Urinary tract infection, site not specified    Past Surgical History:  Procedure Laterality Date   ABDOMINAL HYSTERECTOMY     BREAST LUMPECTOMY     CATARACT EXTRACTION     CHOLECYSTECTOMY  1962   COLONOSCOPY  12/13/2010   Left-sided diverticulosis.  Cecal polyp, status post hot snare polypectomy/ Diminutive rectal polyp, status post cold biopsy removal tender/painful anal canal, ? occult anal fissure- Not visualized   COLONOSCOPY N/A 10/14/2015   Diverticulosis   ESOPHAGOGASTRODUODENOSCOPY  05/10/10   benign mucosa with mild chronic inflammation.   ESOPHAGOGASTRODUODENOSCOPY (EGD) WITH PROPOFOL N/A 02/13/2019   Procedure: ESOPHAGOGASTRODUODENOSCOPY (EGD) WITH PROPOFOL;  Surgeon: Daneil Dolin, MD;  Location: [facility];  Service: Endoscopy;  Laterality: N/A;  3:00pm   FLEXIBLE SIGMOIDOSCOPY  12/29/2011   Procedure: FLEXIBLE SIGMOIDOSCOPY;  Surgeon: Rogene Houston, MD;  Location: [facility];  Service: Endoscopy;  Laterality: N/A;  200-Ann notified pt to be here @ 5:99   NISSEN FUNDOPLICATION     YAG LASER APPLICATION Left 3/57/0177   Procedure: YAG LASER APPLICATION;  Surgeon: Williams Che, MD;  Location: [facility];  Service: Ophthalmology;  Laterality: Left;   Family History  Problem  Relation Age of Onset   Diabetes Mother    Alzheimer's disease Mother    Cancer Mother    Heart disease Father    Cancer Sister    Heart disease Brother        Also mother, Father, brother and 2 sons   Cancer Son        testicular cancer    Aneurysm Son    Diabetes Son    Hypertension Brother        also Sister x2   Colon cancer Neg Hx    Social History   Socioeconomic History   Marital status: Married    Spouse name: Not on file   Number of children: 4   Years of education: Not on file   Highest education level: Not on file  Occupational History   Not on file  Tobacco Use   Smoking status: Former    Packs/day: 1.00    Years: 55.00    Total pack years: 55.00  Types: Cigarettes    Quit date: 09/15/2010    Years since quitting: 11.4   Smokeless tobacco: Never  Vaping Use   Vaping Use: Never used  Substance and Sexual Activity   Alcohol use: No    Alcohol/week: 0.0 standard drinks of alcohol   Drug use: No   Sexual activity: Not Currently  Other Topics Concern   Not on file  Social History Narrative   Not on file   Social Determinants of Health   Financial Resource Strain: Low Risk  (09/06/2020)   Overall Financial Resource Strain (CARDIA)    Difficulty of Paying Living Expenses: Not hard at all  Food Insecurity: No Food Insecurity (09/06/2020)   Hunger Vital Sign    Worried About Running Out of Food in the Last Year: Never true    Ran Out of Food in the Last Year: Never true  Transportation Needs: No Transportation Needs (09/06/2020)   PRAPARE - Hydrologist (Medical): No    Lack of Transportation (Non-Medical): No  Physical Activity: Inactive (09/06/2020)   Exercise Vital Sign    Days of Exercise per Week: 0 days    Minutes of Exercise per Session: 0 min  Stress: No Stress Concern Present (09/06/2020)   [place]    Feeling of Stress : Not at all  Social  Connections: Moderately Isolated (09/06/2020)   Social Connection and Isolation Panel [NHANES]    Frequency of Communication with Friends and Family: More than three times a week    Frequency of Social Gatherings with Friends and Family: More than three times a week    Attends Religious Services: Never    Marine scientist or Organizations: No    Attends Archivist Meetings: Never    Marital Status: Married    Tobacco Counseling Counseling given: Not Answered   Clinical Intake:                 Diabetic?no         Activities of Daily Living     No data to display          Patient Care Team: Lindell Spar, MD as PCP - General (Internal Medicine) Fay Records, MD as PCP - Cardiology (Cardiology) Gala Romney Cristopher Estimable, MD (Gastroenterology) Rothbart, Cristopher Estimable, MD (Inactive) (Cardiology) Rexene Agent, MD as Attending Physician (Nephrology)  Indicate any recent Medical Services you may have received from other than [facility] providers in the past year (date may be approximate).     Assessment:   This is a routine wellness examination for [name].  Hearing/Vision screen No results found.  Dietary issues and exercise activities discussed:     Goals Addressed   None    Depression Screen    01/25/2022   11:01 AM 10/31/2021    8:24 AM 10/17/2021    1:02 PM 10/13/2021    9:14 AM 08/11/2021    2:51 PM 06/14/2021    1:02 PM 03/10/2021   11:12 AM  PHQ 2/9 Scores  PHQ - 2 Score 0 0 0 0 0 0 0  PHQ- 9 Score     0 0     Fall Risk    01/25/2022   11:00 AM 10/31/2021    8:24 AM 10/17/2021    1:02 PM 10/13/2021    9:14 AM 08/11/2021    2:51 PM  Fall Risk   Falls in the past year? 0  0 0 0 0  Number falls in past yr: 0 0 0 0 0  Injury with Fall? 0 0 0 0 0  Risk for fall due to : _0   Follow up Falls evaluation completed Falls evaluation completed Falls evaluation completed Falls evaluation  completed Falls evaluation completed    [place]:  Any stairs in or around the home? Yes  If so, are there any without handrails? Yes  Home free of loose throw rugs in walkways, pet beds, electrical cords, etc? Yes  Adequate lighting in your home to reduce risk of falls? Yes   ASSISTIVE DEVICES UTILIZED TO PREVENT FALLS:  Life alert? No  Use of a cane, walker or w/c? Yes  Grab bars in the bathroom? Yes  Shower chair or bench in shower? Yes  Elevated toilet seat or a handicapped toilet? No   TIMED UP AND GO:  Was the test performed? No .  Length of time to ambulate 10 feet:  sec.     Cognitive Function:        Immunizations Immunization History  Administered Date(s) Administered   Fluad Quad(high Dose 65+) 02/02/2020, 12/28/2020, 01/25/2022   Influenza Inj Mdck Quad Pf 12/30/2015   Influenza, High Dose Seasonal PF 02/08/2017, 12/10/2017   Influenza-Unspecified 12/15/2010, 03/01/2012, 12/06/2012, 12/22/2013, 03/01/2015   Moderna SARS-COV2 Booster Vaccination 11/01/2020   Pneumococcal Conjugate-13 12/22/2013   Pneumococcal Polysaccharide-23 11/25/2014, 03/14/2017    TDAP status: Due, Education has been provided regarding the importance of this vaccine. Advised may receive this vaccine at local pharmacy or Health Dept. Aware to provide a copy of the vaccination record if obtained from local pharmacy or Health Dept. Verbalized acceptance and understanding.  Flu Vaccine status: Due, Education has been provided regarding the importance of this vaccine. Advised may receive this vaccine at local pharmacy or Health Dept. Aware to provide a copy of the vaccination record if obtained from local pharmacy or Health Dept. Verbalized acceptance and understanding.  Pneumococcal vaccine status: Up to date  Covid-19 vaccine status: Information provided on how to obtain vaccines.   Qualifies for Shingles Vaccine? Yes   Zostavax completed No    Shingrix Completed?: No.    Education has been provided regarding the importance of this vaccine. Patient has been advised to call insurance company to determine out of pocket expense if they have not yet received this vaccine. Advised may also receive vaccine at local pharmacy or Health Dept. Verbalized acceptance and understanding.  Screening Tests Health Maintenance  Topic Date Due   TETANUS/TDAP  Never done   Zoster Vaccines- Shingrix (1 of 2) Never done   Medicare Annual Wellness (AWV)  09/06/2021   Pneumonia Vaccine 25+ Years old  Completed   INFLUENZA VACCINE  Completed   DEXA SCAN  Completed   HPV VACCINES  Aged Out   COVID-19 Vaccine  Discontinued    Health Maintenance  Health Maintenance Due  Topic Date Due   TETANUS/TDAP  Never done   Zoster Vaccines- Shingrix (1 of 2) Never done   Medicare Annual Wellness (AWV)  09/06/2021    Colorectal cancer screening: No longer required.   Mammogram status: No longer required due to age.    Lung Cancer Screening: (Low Dose CT Chest recommended if Age 82-80 years, 30 pack-year currently smoking OR have quit w/in 15years.) does not qualify.   Lung Cancer Screening Referral:   Additional  Screening:  Hepatitis C Screening: does not qualify; Completed   Vision Screening: Recommended annual ophthalmology exams for early detection of glaucoma and other disorders of the eye. Is the patient up to date with their annual eye exam?  Yes  Who is the provider or what is the name of the office in which the patient attends annual eye exams? Dr Valetta Close If pt is not established with a provider, would they like to be referred to a provider to establish care? No .   Dental Screening: Recommended annual dental exams for proper oral hygiene  Community Resource Referral / Chronic Care Management: CRR required this visit?  No   CCM required this visit?  No      Plan:     I have personally reviewed and noted the following in the patient's  chart:   Medical and social history Use of alcohol, tobacco or illicit drugs  Current medications and supplements including opioid prescriptions. Patient is not currently taking opioid prescriptions. Functional ability and status Nutritional status Physical activity Advanced directives List of other physicians Hospitalizations, surgeries, and ER visits in previous 12 months Vitals Screenings to include cognitive, depression, and falls Referrals and appointments  In addition, I have reviewed and discussed with patient certain preventive protocols, quality metrics, and best practice recommendations. A written personalized care plan for preventive services as well as general preventive health recommendations were provided to patient.     Jill Side, [place]   02/07/2022   Nurse Notes:

## 2022-02-07 NOTE — Patient Instructions (Signed)
  Ms. Vanderhoff , Thank you for taking time to come for your Medicare Wellness Visit. I appreciate your ongoing commitment to your health goals. Please review the following plan we discussed and let me know if I can assist you in the future.   These are the goals we discussed:  Goals      Exercise 3x per week (30 min per time)     Patient Stated     Patient currently does not have a goal     Prevent falls        This is a list of the screening recommended for you and due dates:  Health Maintenance  Topic Date Due   Tetanus Vaccine  Never done   Zoster (Shingles) Vaccine (1 of 2) Never done   Medicare Annual Wellness Visit  02/08/2023   Pneumonia Vaccine  Completed   Flu Shot  Completed   DEXA scan (bone density measurement)  Completed   HPV Vaccine  Aged Out   COVID-19 Vaccine  Discontinued

## 2022-02-22 ENCOUNTER — Encounter: Payer: Medicare Other | Admitting: Internal Medicine

## 2022-02-22 DIAGNOSIS — G894 Chronic pain syndrome: Secondary | ICD-10-CM | POA: Diagnosis not present

## 2022-02-22 DIAGNOSIS — Z79891 Long term (current) use of opiate analgesic: Secondary | ICD-10-CM | POA: Diagnosis not present

## 2022-02-22 DIAGNOSIS — M130 Polyarthritis, unspecified: Secondary | ICD-10-CM | POA: Diagnosis not present

## 2022-03-02 NOTE — Progress Notes (Unsigned)
Cardiology Office Note:    Date:  03/03/2022  ID:  [name], DOB 1936-01-09, MRN [id number]  PCP:  Lindell Spar, MD   [place] [facility] Cardiologist:  Dorris Carnes, MD     Referring MD: Lindell Spar, MD   CC: Here for overdue follow-up  History of Present Illness:    [name] is a 86 y.o. female with a hx of the following:  Nonobstructive CAD HTN HLD CKD stage 4 Hx of palpitations COPD Asthma  Patient is a 86 year old female with past medical history as mentioned above.  She underwent heart cath in 2002 that revealed nonobstructive CAD, low risk Myoview in 2013, and previous monitor for palpitations revealed PACs, PVCs, and SVT.   Last seen by Bernerd Pho, PA-C on June 24, 2020.  Was overall doing well from a cardiac perspective.  Chief complaint office visit was worsening bilateral arthritis in knees.  [place] [facility].  Was told to follow-up in 6 months.   Today she presents for overdue follow-up visit.  She states she is doing well. Denies any cardiac issues or complaints. Denies any changes to her health since her last visit. Son who is present in the room says she does have [facility].  Denies any chest pain, shortness of breath, palpitations, syncope, presyncope, dizziness, orthopnea, PND, swelling, significant weight changes, acute bleeding, or claudication.  Says she sees a kidney doctor next week.  Says the only reason she is here today is to get her medications refilled.  Denies any other questions or concerns today.   Past Medical History:  Diagnosis Date   Anxiety disorder, unspecified    Arteriosclerotic cardiovascular disease (ASCVD) 2006   Nonobstructive; < 50% lesions on cath 2002; negative stress nuclear study in 10/2004   Asthma    Chest pain, unspecified    Chronic kidney disease, unspecified    Chronic obstructive pulmonary disease, unspecified ([place])    Chronic pelvic pain in female    Colitis due to Clostridium difficile  1994   1994   COPD (chronic obstructive pulmonary disease) ([place])    Depression with anxiety    Diarrhea, unspecified    Disorder of thyroid, unspecified    Gastro-esophageal reflux disease without esophagitis    Gastroesophageal reflux disease    Hyperlipidemia    Hyperlipidemia, unspecified    Hypertension    Hypertensive chronic kidney disease with stage 1 through stage 4 chronic kidney disease, or unspecified chronic kidney disease    Hyperthyroidism    Hypothyroidism    Hypothyroidism, unspecified    Kidney disease, chronic, stage III (moderate, EGFR 30-59 ml/min) (HCC)    Lower abdominal pain, unspecified    Major depressive disorder, single episode, severe without psychotic features ([place])    Major depressive disorder, single episode, unspecified    Nausea    Other postherpetic nervous system involvement    Pelvic and perineal pain    Rash and other nonspecific skin eruption    S/P colonoscopy 2007   few diverticula, otherwise nl   S/P colonoscopy 12/13/10   rectal, cecal polyp, left-sided diverticulosis, hyperplastic. ?anal fissure? treated empirically   S/P endoscopy 2009   linear esophageal erosions   S/P endoscopy 05/10/10   Question island of salmon-colored epithelium in distal esophagus ; no Barrett's.    Shingles    Tobacco abuse, in remission    55-pack-year consumption; discontinued 08/2010   Unspecified asthma, uncomplicated    Unspecified osteoarthritis, unspecified site    Urinary tract  infection, site not specified     Past Surgical History:  Procedure Laterality Date   ABDOMINAL HYSTERECTOMY     BREAST LUMPECTOMY     CATARACT EXTRACTION     CHOLECYSTECTOMY  1962   COLONOSCOPY  12/13/2010   Left-sided diverticulosis.  Cecal polyp, status post hot snare polypectomy/ Diminutive rectal polyp, status post cold biopsy removal tender/painful anal canal, ? occult anal fissure- Not visualized   COLONOSCOPY N/A 10/14/2015   Diverticulosis    ESOPHAGOGASTRODUODENOSCOPY  05/10/10   benign mucosa with mild chronic inflammation.   ESOPHAGOGASTRODUODENOSCOPY (EGD) WITH PROPOFOL N/A 02/13/2019   Procedure: ESOPHAGOGASTRODUODENOSCOPY (EGD) WITH PROPOFOL;  Surgeon: Daneil Dolin, MD;  [facility];  Service: Endoscopy;  Laterality: N/A;  3:00pm   FLEXIBLE SIGMOIDOSCOPY  12/29/2011   Procedure: FLEXIBLE SIGMOIDOSCOPY;  Surgeon: Rogene Houston, MD;  Location: [facility];  Service: Endoscopy;  Laterality: N/A;  200-Ann notified pt to be here @ 1:61   NISSEN FUNDOPLICATION     YAG LASER APPLICATION Left 0/96/0454   Procedure: YAG LASER APPLICATION;  Surgeon: Williams Che, MD;  Location: [facility] ORS;  Service: Ophthalmology;  Laterality: Left;    Current Medications: Current Meds  Medication Sig   albuterol (VENTOLIN HFA) 108 (90 Base) MCG/ACT inhaler Inhale 1-2 puffs into the lungs every 6 (six) hours as needed for wheezing or shortness of breath.   chlorthalidone (HYGROTON) 25 MG tablet Take 12.5 mg by mouth daily.   ipratropium-albuterol (DUONEB) 0.5-2.5 (3) MG/3ML SOLN Take 3 mLs by nebulization every 6 (six) hours as needed.   levothyroxine (SYNTHROID) 88 MCG tablet TAKE (1) TABLET BY MOUTH EACH MORNING.   lidocaine (XYLOCAINE) 2 % solution Use as directed 10 mLs in the mouth or throat as needed for mouth pain.   mirtazapine (REMERON) 7.5 MG tablet Take 1 tablet (7.5 mg total) by mouth at bedtime.   nystatin (MYCOSTATIN) 100000 UNIT/ML suspension Take 5 mLs (500,000 Units total) by mouth 4 (four) times daily.   omeprazole (PRILOSEC) 40 MG capsule Take 1 capsule (40 mg total) by mouth daily.   ondansetron (ZOFRAN) 4 MG tablet TAKE 1 TABLET BY MOUTH THREE TIMES AS NEEDED FOR NAUSEA.   oxyCODONE-acetaminophen (PERCOCET/ROXICET) 5-325 MG tablet Take 1 tablet by mouth every 6 (six) hours as needed.   UNABLE TO FIND Wheelchair (small) - OA and physical deconditioning (M19.90, R53.81)   UNABLE TO FIND Hand held shower head with  long cord    atorvastatin (LIPITOR) 20 MG tablet TAKE ONE TABLET BY MOUTH ONCE DAILY.    carvedilol (COREG) 6.25 MG tablet Take 1 tablet (6.25 mg total) by mouth 2 (two) times daily with a meal.     Allergies:   Hydralazine, Nalbuphine, Paroxetine, Sulfa antibiotics, and Venlafaxine   Social History   Socioeconomic History   Marital status: Married    Spouse name: Not on file   Number of children: 4   Years of education: Not on file   Highest education level: Not on file  Occupational History   Not on file  Tobacco Use   Smoking status: Former    Packs/day: 1.00    Years: 55.00    Total pack years: 55.00    Types: Cigarettes    Quit date: 09/15/2010    Years since quitting: 11.4   Smokeless tobacco: Never  Vaping Use   Vaping Use: Never used  Substance and Sexual Activity   Alcohol use: No    Alcohol/week: 0.0 standard drinks of alcohol  Drug use: No   Sexual activity: Not Currently  Other Topics Concern   Not on file  Social History Narrative   Not on file   Social Determinants of Health   Financial Resource Strain: Low Risk  (02/07/2022)   Overall Financial Resource Strain (CARDIA)    Difficulty of Paying Living Expenses: Not hard at all  Food Insecurity: No Food Insecurity (02/07/2022)   Hunger Vital Sign    Worried About Running Out of Food in the Last Year: Never true    Ran Out of Food in the Last Year: Never true  Transportation Needs: No Transportation Needs (02/07/2022)   PRAPARE - Hydrologist (Medical): No    Lack of Transportation (Non-Medical): No  Physical Activity: Inactive (09/06/2020)   Exercise Vital Sign    Days of Exercise per Week: 0 days    Minutes of Exercise per Session: 0 min  Stress: No Stress Concern Present (02/07/2022)   [place]    Feeling of Stress : Not at all  Social Connections: Socially Isolated (02/07/2022)   Social Connection  and Isolation Panel [NHANES]    Frequency of Communication with Friends and Family: More than three times a week    Frequency of Social Gatherings with Friends and Family: Once a week    Attends Religious Services: Never    Marine scientist or Organizations: No    Attends Archivist Meetings: Never    Marital Status: Separated     Family History: The patient's family history includes Alzheimer's disease in her mother; Aneurysm in her son; Cancer in her mother, sister, and son; Diabetes in her mother and son; Heart disease in her brother and father; Hypertension in her brother. There is no history of Colon cancer.  ROS:   Review of Systems  Constitutional:  Positive for malaise/fatigue. Negative for chills, diaphoresis, fever and weight loss.  HENT: Negative.    Eyes: Negative.   Respiratory: Negative.    Cardiovascular: Negative.   Gastrointestinal: Negative.   Genitourinary: Negative.   Musculoskeletal:  Positive for joint pain. Negative for back pain, falls, myalgias and neck pain.  Skin: Negative.   Neurological: Negative.   Endo/Heme/Allergies: Negative.   Psychiatric/Behavioral: Negative.      Please see the history of present illness.    All other systems reviewed and are negative.  EKGs/Labs/Other Studies Reviewed:    The following studies were reviewed today:   EKG:  EKG is not ordered today.  EKG dated Aug 02, 2021 revealed sinus rhythm, 80 bpm, otherwise nothing acute.  Cardiac monitor on September 18, 2019: Sinus rhythm  With occasional PACs, short burst SVT(5 beats)   42 to 171 bpm   Average HR 60 NO significant pauses   2D echocardiogram on October 07, 2015: Left ventricle: The cavity size was normal. Wall thickness was    normal. Systolic function was normal. The estimated ejection    fraction was in the range of 55% to 60%. Wall motion was normal;    there were no regional wall motion abnormalities. Doppler    parameters are consistent with  abnormal left ventricular    relaxation (grade 1 diastolic dysfunction).  - Aortic valve: Trileaflet; mildly calcified leaflets.  - Mitral valve: Mildly thickened leaflets . There was mild    regurgitation.  - Left atrium: The atrium was at the upper limits of normal in    size.  -  Right atrium: Central venous pressure (est): 3 mm Hg.  - Tricuspid valve: There was trivial regurgitation.  - Pulmonary arteries: PA peak pressure: 34 mm Hg (S).  - Pericardium, extracardiac: There was no pericardial effusion.  Lexiscan on April 19, 2011: Overall low risk exercise Myoview as outlined.  There were no  diagnostic ST-segment changes or arrhythmias.  Hypertensive  response to exercise was noted.  Perfusion imaging is most  consistent with soft tissue attenuation affecting the anteroseptal  / anterior apical wall.  No definite ischemia.  LVEF is 63%.   Recent Labs: 08/01/2021: B Natriuretic Peptide 94.0 10/17/2021: TSH 2.330 10/22/2021: ALT 11; BUN 33; Creatinine, Ser 2.22; Hemoglobin 9.5; Platelets 246; Potassium 4.7; Sodium 136  Recent Lipid Panel    Component Value Date/Time   CHOL 164 09/17/2018 0000   TRIG 145 09/17/2018 0000   HDL 44 09/17/2018 0000   CHOLHDL 2.9 07/15/2008 0528   VLDL 23 07/15/2008 0528   LDLCALC 95 09/17/2018 0000     Risk Assessment/Calculations:      HYPERTENSION CONTROL Vitals:   03/03/22 1240 03/03/22 1305  BP: (!) 158/82 (!) 140/82    The patient's blood pressure is elevated above target today.  In order to address the patient's elevated BP: The blood pressure is usually elevated in clinic.  Blood pressures monitored at home have been optimal.; Follow up with general cardiology has been recommended.; Blood pressure will be monitored at home to determine if medication changes need to be made.     Physical Exam:    VS:  BP (!) 140/82 (BP Location: Left Arm, Patient Position: Sitting, Cuff Size: Normal)   Pulse 60   Ht 5' (1.524 m)   Wt 103 lb  (46.7 kg)   SpO2 96%   BMI 20.12 kg/m     Wt Readings from Last 3 Encounters:  03/03/22 103 lb (46.7 kg)  10/31/21 104 lb (47.2 kg)  10/22/21 104 lb (47.2 kg)     GEN: Thin, frail 86 y.o. female in NAD  HEENT: Normal NECK: No JVD; No carotid bruits CARDIAC: S1/S2, RRR, no murmurs, rubs, gallops; 2+ peripheral pulses throughout, strong and equal bilaterally RESPIRATORY:  Clear and diminished to auscultation without rales, wheezing or rhonchi  MUSCULOSKELETAL:  No edema; thoracic kyphosis noted, otherwise no deformity SKIN: Thin skin, warm and dry NEUROLOGIC:  Alert and oriented x 3 PSYCHIATRIC:  Normal affect   ASSESSMENT:    1. Coronary artery disease involving native heart without angina pectoris, unspecified vessel or lesion type   2. Primary hypertension   3. Hyperlipidemia, unspecified hyperlipidemia type   4. CKD (chronic kidney disease) stage 4, GFR 15-29 ml/min (HCC)   5. Other fatigue   6. Anemia due to stage 4 chronic kidney disease ([place])   7. Diastolic dysfunction   8. Nonrheumatic mitral valve regurgitation   9. Nonrheumatic tricuspid valve regurgitation    PLAN:    In order of problems listed above:  Nonobstructive CAD Stable with no anginal symptoms. No indication for ischemic evaluation.  Continue atorvastatin and carvedilol. Will refill medications today. Heart healthy diet and regular cardiovascular exercise as tolerated encouraged.   HTN Blood pressure on arrival 158/82, repeat BP by me 140/82.  Son does admit to patient having a history of whitecoat hypertension. Discussed to monitor BP at home at least 2 hours after medications and sitting for 5-10 minutes. Continue carvedilol and chlorthalidone. Heart healthy diet and regular cardiovascular exercise as tolerated encouraged. Appreciate recs by Nephrology.  HLD No recent lipid panel on file.  She is requesting to have this lab work drawn at nephrology office for appointment next week.  Continue  atorvastatin and we will refill this today. Heart healthy diet and regular cardiovascular exercise as tolerated encouraged.   CKD stage 4 Labs from July 2023 revealed serum creatinine 2.22 with a eGFR at 21.  Continue to follow-up with nephrology.  Fatigue, Anemia Labs from July 2023 revealed hemoglobin 9.5, hematocrit 29.7, most likely due to CKD stage 4. I do not see a recent iron panel on file. Will defer to PCP. Continue to follow with PCP.  6. Diastolic dysfunction, mitral regurgitation, tricuspid regurgitation Echocardiogram in 2017 revealed EF 55 to 60%, grade 1 DD, mild MR, trivial TR, no other valvular abnormalities.  Recommend to repeat echocardiogram at next office visit with Dr. Dellia Cloud, as she defers repeating Echocardiogram at this time and is asymptomatic.   Disposition: Follow up with Dr. Dellia Cloud in 6 months or sooner if anything changes.   Medication Adjustments/Labs and Tests Ordered: Current medicines are reviewed at length with the patient today.  Concerns regarding medicines are outlined above.  No orders of the defined types were placed in this encounter.  Meds ordered this encounter  Medications   carvedilol (COREG) 6.25 MG tablet    Sig: Take 1 tablet (6.25 mg total) by mouth 2 (two) times daily with a meal.    Dispense:  180 tablet    Refill:  1   atorvastatin (LIPITOR) 20 MG tablet    Sig: Take 1 tablet (20 mg total) by mouth daily.    Dispense:  90 tablet    Refill:  1    Patient Instructions  Medication Instructions:  Your physician recommends that you continue on your current medications as directed. Please refer to the Current Medication list given to you today.  Labwork: Please have your kidney doctor order your lipid panel and send results to our office  Testing/Procedures: none  Follow-Up: Your physician recommends that you schedule a follow-up appointment in: 6 months  Any Other Special Instructions Will Be Listed Below (If  Applicable).  If you need a refill on your cardiac medications before your next appointment, please call your pharmacy.   Signed, Finis Bud, NP  03/04/2022 1:59 PM    [place]

## 2022-03-03 ENCOUNTER — Ambulatory Visit: Payer: Medicare Other | Attending: Cardiology | Admitting: Nurse Practitioner

## 2022-03-03 ENCOUNTER — Encounter: Payer: Self-pay | Admitting: Nurse Practitioner

## 2022-03-03 VITALS — BP 140/82 | HR 60 | Ht 60.0 in | Wt 103.0 lb

## 2022-03-03 DIAGNOSIS — I10 Essential (primary) hypertension: Secondary | ICD-10-CM | POA: Diagnosis not present

## 2022-03-03 DIAGNOSIS — I2510 Atherosclerotic heart disease of native coronary artery without angina pectoris: Secondary | ICD-10-CM

## 2022-03-03 DIAGNOSIS — D631 Anemia in chronic kidney disease: Secondary | ICD-10-CM

## 2022-03-03 DIAGNOSIS — E785 Hyperlipidemia, unspecified: Secondary | ICD-10-CM

## 2022-03-03 DIAGNOSIS — I5189 Other ill-defined heart diseases: Secondary | ICD-10-CM | POA: Diagnosis not present

## 2022-03-03 DIAGNOSIS — N184 Chronic kidney disease, stage 4 (severe): Secondary | ICD-10-CM | POA: Diagnosis not present

## 2022-03-03 DIAGNOSIS — I340 Nonrheumatic mitral (valve) insufficiency: Secondary | ICD-10-CM

## 2022-03-03 DIAGNOSIS — I361 Nonrheumatic tricuspid (valve) insufficiency: Secondary | ICD-10-CM | POA: Diagnosis not present

## 2022-03-03 DIAGNOSIS — R5383 Other fatigue: Secondary | ICD-10-CM | POA: Diagnosis not present

## 2022-03-03 MED ORDER — ATORVASTATIN CALCIUM 20 MG PO TABS: 20.0000 mg | ORAL_TABLET | Freq: Every day | ORAL | 1 refills | Status: DC

## 2022-03-03 MED ORDER — CARVEDILOL 6.25 MG PO TABS: 6.2500 mg | ORAL_TABLET | Freq: Two times a day (BID) | ORAL | 1 refills | Status: DC

## 2022-03-03 NOTE — Patient Instructions (Addendum)
Medication Instructions:  Your physician recommends that you continue on your current medications as directed. Please refer to the Current Medication list given to you today.  Labwork: Please have your kidney doctor order your lipid panel and send results to our office  Testing/Procedures: none  Follow-Up: Your physician recommends that you schedule a follow-up appointment in: 6 months  Any Other Special Instructions Will Be Listed Below (If Applicable).  If you need a refill on your cardiac medications before your next appointment, please call your pharmacy.

## 2022-03-06 DIAGNOSIS — E871 Hypo-osmolality and hyponatremia: Secondary | ICD-10-CM | POA: Diagnosis not present

## 2022-03-06 DIAGNOSIS — R809 Proteinuria, unspecified: Secondary | ICD-10-CM | POA: Diagnosis not present

## 2022-03-06 DIAGNOSIS — N189 Chronic kidney disease, unspecified: Secondary | ICD-10-CM | POA: Diagnosis not present

## 2022-03-06 DIAGNOSIS — E039 Hypothyroidism, unspecified: Secondary | ICD-10-CM | POA: Diagnosis not present

## 2022-03-06 DIAGNOSIS — N179 Acute kidney failure, unspecified: Secondary | ICD-10-CM | POA: Diagnosis not present

## 2022-03-06 DIAGNOSIS — I129 Hypertensive chronic kidney disease with stage 1 through stage 4 chronic kidney disease, or unspecified chronic kidney disease: Secondary | ICD-10-CM | POA: Diagnosis not present

## 2022-03-06 DIAGNOSIS — D631 Anemia in chronic kidney disease: Secondary | ICD-10-CM | POA: Diagnosis not present

## 2022-03-06 DIAGNOSIS — N184 Chronic kidney disease, stage 4 (severe): Secondary | ICD-10-CM | POA: Diagnosis not present

## 2022-03-06 DIAGNOSIS — N2581 Secondary hyperparathyroidism of renal origin: Secondary | ICD-10-CM | POA: Diagnosis not present

## 2022-03-09 ENCOUNTER — Other Ambulatory Visit: Payer: Self-pay

## 2022-03-09 DIAGNOSIS — D631 Anemia in chronic kidney disease: Secondary | ICD-10-CM | POA: Insufficient documentation

## 2022-03-10 ENCOUNTER — Telehealth: Payer: Self-pay | Admitting: Pharmacy Technician

## 2022-03-10 NOTE — Telephone Encounter (Signed)
Auth Submission: NO AUTH NEEDED Payer: [employer] Medication & CPT/J Code(s) submitted: Feraheme (ferumoxytol) [id number] Route of submission (phone, fax, portal): PORTAL Phone # Fax # Auth type: Buy/Bill Units/visits requested: X2 Reference number: [id number] Approval from:03/10/22 to 04/10/22

## 2022-03-13 ENCOUNTER — Encounter (HOSPITAL_COMMUNITY)
Admission: RE | Admit: 2022-03-13 | Discharge: 2022-03-13 | Disposition: A | Discharge status: Home / Self Care | Payer: Medicare Other | Source: Ambulatory Visit | Attending: Nephrology | Admitting: Nephrology

## 2022-03-13 VITALS — BP 159/66 | HR 58 | Temp 97.7°F | Resp 15

## 2022-03-13 DIAGNOSIS — D509 Iron deficiency anemia, unspecified: Secondary | ICD-10-CM | POA: Diagnosis not present

## 2022-03-13 DIAGNOSIS — D631 Anemia in chronic kidney disease: Secondary | ICD-10-CM | POA: Diagnosis not present

## 2022-03-13 DIAGNOSIS — N189 Chronic kidney disease, unspecified: Secondary | ICD-10-CM | POA: Diagnosis not present

## 2022-03-13 MED ORDER — SODIUM CHLORIDE 0.9 % IV SOLN
510.0000 mg | Freq: Once | INTRAVENOUS | Status: AC
  Administered 2022-03-13: 510 mg via INTRAVENOUS
  Filled 2022-03-13: qty 17

## 2022-03-13 MED ORDER — ACETAMINOPHEN 325 MG PO TABS
650.0000 mg | ORAL_TABLET | Freq: Once | ORAL | Status: AC
  Administered 2022-03-13: 650 mg via ORAL
  Filled 2022-03-13: qty 2

## 2022-03-13 MED ORDER — DIPHENHYDRAMINE HCL 25 MG PO CAPS
25.0000 mg | ORAL_CAPSULE | Freq: Once | ORAL | Status: AC
  Administered 2022-03-13: 25 mg via ORAL
  Filled 2022-03-13: qty 1

## 2022-03-13 NOTE — Progress Notes (Signed)
Diagnosis: Iron Deficiency Anemia  Provider:  Donato Heinz MD  Procedure: Infusion  IV Type: Peripheral, IV Location: R Antecubital  Feraheme (Ferumoxytol), Dose: 510 mg  Infusion Start Time: 3143    Infusion Stop Time: 8887  Post Infusion IV Care: Observation period completed and Peripheral IV Discontinued  Discharge: Condition: Good, Destination: Home . AVS provided to patient.   Performed by:  Hughie Closs, RN

## 2022-03-16 ENCOUNTER — Other Ambulatory Visit: Payer: Self-pay | Admitting: Internal Medicine

## 2022-03-16 DIAGNOSIS — J439 Emphysema, unspecified: Secondary | ICD-10-CM

## 2022-03-22 ENCOUNTER — Encounter (HOSPITAL_COMMUNITY)
Admission: RE | Admit: 2022-03-22 | Discharge: 2022-03-22 | Disposition: A | Discharge status: Home / Self Care | Payer: Medicare Other | Source: Ambulatory Visit | Attending: Nephrology | Admitting: Nephrology

## 2022-03-22 VITALS — BP 194/74 | HR 58 | Temp 97.6°F | Resp 15

## 2022-03-22 DIAGNOSIS — N189 Chronic kidney disease, unspecified: Secondary | ICD-10-CM | POA: Diagnosis not present

## 2022-03-22 DIAGNOSIS — D631 Anemia in chronic kidney disease: Secondary | ICD-10-CM | POA: Diagnosis not present

## 2022-03-22 DIAGNOSIS — D509 Iron deficiency anemia, unspecified: Secondary | ICD-10-CM | POA: Diagnosis not present

## 2022-03-22 MED ORDER — DIPHENHYDRAMINE HCL 25 MG PO CAPS
25.0000 mg | ORAL_CAPSULE | Freq: Once | ORAL | Status: AC
  Administered 2022-03-22: 25 mg via ORAL
  Filled 2022-03-22: qty 1

## 2022-03-22 MED ORDER — SODIUM CHLORIDE 0.9 % IV SOLN
510.0000 mg | Freq: Once | INTRAVENOUS | Status: AC
  Administered 2022-03-22: 510 mg via INTRAVENOUS
  Filled 2022-03-22: qty 17

## 2022-03-22 MED ORDER — ACETAMINOPHEN 325 MG PO TABS
650.0000 mg | ORAL_TABLET | Freq: Once | ORAL | Status: AC
  Administered 2022-03-22: 650 mg via ORAL
  Filled 2022-03-22: qty 2

## 2022-03-22 NOTE — Progress Notes (Signed)
Diagnosis: Iron Deficiency Anemia  Provider:  Donato Heinz MD  Procedure: Infusion  IV Type: Peripheral, IV Location: R Forearm  Feraheme (Ferumoxytol), Dose: 510 mg  Infusion Start Time: 9957  Infusion Stop Time: 9009  Post Infusion IV Care: Observation period completed and Peripheral IV Discontinued  Discharge: Condition: Good, Destination: Home . AVS provided to patient.   Performed by:  Hughie Closs, RN

## 2022-03-29 DIAGNOSIS — M797 Fibromyalgia: Secondary | ICD-10-CM | POA: Diagnosis not present

## 2022-03-29 DIAGNOSIS — I10 Essential (primary) hypertension: Secondary | ICD-10-CM | POA: Diagnosis not present

## 2022-03-29 DIAGNOSIS — R2689 Other abnormalities of gait and mobility: Secondary | ICD-10-CM | POA: Diagnosis not present

## 2022-03-29 DIAGNOSIS — G8929 Other chronic pain: Secondary | ICD-10-CM | POA: Diagnosis not present

## 2022-04-03 DIAGNOSIS — G8929 Other chronic pain: Secondary | ICD-10-CM | POA: Diagnosis not present

## 2022-04-03 DIAGNOSIS — M170 Bilateral primary osteoarthritis of knee: Secondary | ICD-10-CM | POA: Diagnosis not present

## 2022-04-03 DIAGNOSIS — M25561 Pain in right knee: Secondary | ICD-10-CM | POA: Diagnosis not present

## 2022-04-03 DIAGNOSIS — M25562 Pain in left knee: Secondary | ICD-10-CM | POA: Diagnosis not present

## 2022-04-18 ENCOUNTER — Telehealth: Payer: Self-pay | Admitting: Internal Medicine

## 2022-04-18 NOTE — Telephone Encounter (Signed)
Pt wants to know if she can please get a raised toilet seat with the arm rests to go onto her toilet?

## 2022-04-19 ENCOUNTER — Other Ambulatory Visit: Payer: Self-pay

## 2022-04-19 MED ORDER — UNABLE TO FIND: 1.0000 | Freq: Every day | 0 refills | Status: DC

## 2022-04-19 NOTE — Telephone Encounter (Signed)
lmtrc

## 2022-04-19 NOTE — Telephone Encounter (Signed)
Spoke to [employer] , will fax over rx to [employer]

## 2022-04-19 NOTE — Telephone Encounter (Signed)
Patient returning call. Please return patient call.

## 2022-04-24 ENCOUNTER — Other Ambulatory Visit: Payer: Self-pay | Admitting: Internal Medicine

## 2022-04-24 DIAGNOSIS — I10 Essential (primary) hypertension: Secondary | ICD-10-CM

## 2022-04-24 DIAGNOSIS — E039 Hypothyroidism, unspecified: Secondary | ICD-10-CM

## 2022-04-25 ENCOUNTER — Ambulatory Visit: Payer: Medicare Other | Admitting: Cardiology

## 2022-04-28 ENCOUNTER — Telehealth: Payer: Self-pay | Admitting: Internal Medicine

## 2022-04-28 ENCOUNTER — Other Ambulatory Visit: Payer: Self-pay

## 2022-04-28 MED ORDER — UNABLE TO FIND: 1.0000 | Freq: Every day | 0 refills | Status: DC

## 2022-04-28 NOTE — Telephone Encounter (Signed)
Blane Ohara, DIL and caregiver called back for [name], stated that she called [facility] regarding the toilet seat and they are stating they need a code.

## 2022-04-28 NOTE — Telephone Encounter (Signed)
Rx sent with code

## 2022-04-28 NOTE — Telephone Encounter (Signed)
Spoke to [employer]

## 2022-04-28 NOTE — Telephone Encounter (Signed)
Patient relative Lelon Frohlich called waiting on rx for toilet seat with arm rails.  It has been approved not received the rx. Send to [employer]

## 2022-05-11 DIAGNOSIS — R5381 Other malaise: Secondary | ICD-10-CM | POA: Diagnosis not present

## 2022-05-12 ENCOUNTER — Encounter (HOSPITAL_COMMUNITY): Payer: Self-pay | Admitting: Nephrology

## 2022-05-25 ENCOUNTER — Other Ambulatory Visit: Payer: Self-pay | Admitting: Internal Medicine

## 2022-06-28 DIAGNOSIS — Z79899 Other long term (current) drug therapy: Secondary | ICD-10-CM | POA: Diagnosis not present

## 2022-06-28 DIAGNOSIS — M797 Fibromyalgia: Secondary | ICD-10-CM | POA: Diagnosis not present

## 2022-06-28 DIAGNOSIS — G8929 Other chronic pain: Secondary | ICD-10-CM | POA: Diagnosis not present

## 2022-06-28 DIAGNOSIS — I10 Essential (primary) hypertension: Secondary | ICD-10-CM | POA: Diagnosis not present

## 2022-06-28 DIAGNOSIS — R2689 Other abnormalities of gait and mobility: Secondary | ICD-10-CM | POA: Diagnosis not present

## 2022-07-10 DIAGNOSIS — R627 Adult failure to thrive: Secondary | ICD-10-CM | POA: Diagnosis not present

## 2022-07-10 DIAGNOSIS — E039 Hypothyroidism, unspecified: Secondary | ICD-10-CM | POA: Diagnosis not present

## 2022-07-10 DIAGNOSIS — E871 Hypo-osmolality and hyponatremia: Secondary | ICD-10-CM | POA: Diagnosis not present

## 2022-07-10 DIAGNOSIS — N179 Acute kidney failure, unspecified: Secondary | ICD-10-CM | POA: Diagnosis not present

## 2022-07-10 DIAGNOSIS — N2581 Secondary hyperparathyroidism of renal origin: Secondary | ICD-10-CM | POA: Diagnosis not present

## 2022-07-10 DIAGNOSIS — N189 Chronic kidney disease, unspecified: Secondary | ICD-10-CM | POA: Diagnosis not present

## 2022-07-10 DIAGNOSIS — D631 Anemia in chronic kidney disease: Secondary | ICD-10-CM | POA: Diagnosis not present

## 2022-07-10 DIAGNOSIS — R809 Proteinuria, unspecified: Secondary | ICD-10-CM | POA: Diagnosis not present

## 2022-07-10 DIAGNOSIS — N184 Chronic kidney disease, stage 4 (severe): Secondary | ICD-10-CM | POA: Diagnosis not present

## 2022-07-10 DIAGNOSIS — I129 Hypertensive chronic kidney disease with stage 1 through stage 4 chronic kidney disease, or unspecified chronic kidney disease: Secondary | ICD-10-CM | POA: Diagnosis not present

## 2022-07-12 ENCOUNTER — Other Ambulatory Visit: Payer: Self-pay

## 2022-07-12 ENCOUNTER — Emergency Department (HOSPITAL_COMMUNITY)
Admission: EM | Admit: 2022-07-12 | Discharge: 2022-07-12 | Disposition: A | Discharge status: Home / Self Care | Payer: 59 | Attending: Emergency Medicine | Admitting: Emergency Medicine

## 2022-07-12 ENCOUNTER — Emergency Department (HOSPITAL_COMMUNITY): Payer: 59

## 2022-07-12 ENCOUNTER — Encounter (HOSPITAL_COMMUNITY): Payer: Self-pay | Admitting: Nephrology

## 2022-07-12 ENCOUNTER — Encounter (HOSPITAL_COMMUNITY): Payer: Self-pay

## 2022-07-12 DIAGNOSIS — Z7984 Long term (current) use of oral hypoglycemic drugs: Secondary | ICD-10-CM | POA: Diagnosis not present

## 2022-07-12 DIAGNOSIS — N189 Chronic kidney disease, unspecified: Secondary | ICD-10-CM | POA: Insufficient documentation

## 2022-07-12 DIAGNOSIS — R911 Solitary pulmonary nodule: Secondary | ICD-10-CM | POA: Diagnosis not present

## 2022-07-12 DIAGNOSIS — I10 Essential (primary) hypertension: Secondary | ICD-10-CM

## 2022-07-12 DIAGNOSIS — R531 Weakness: Secondary | ICD-10-CM | POA: Diagnosis present

## 2022-07-12 DIAGNOSIS — R0602 Shortness of breath: Secondary | ICD-10-CM | POA: Diagnosis not present

## 2022-07-12 DIAGNOSIS — I509 Heart failure, unspecified: Secondary | ICD-10-CM | POA: Diagnosis not present

## 2022-07-12 DIAGNOSIS — Z79899 Other long term (current) drug therapy: Secondary | ICD-10-CM | POA: Diagnosis not present

## 2022-07-12 DIAGNOSIS — N3000 Acute cystitis without hematuria: Secondary | ICD-10-CM

## 2022-07-12 DIAGNOSIS — Z7982 Long term (current) use of aspirin: Secondary | ICD-10-CM | POA: Diagnosis not present

## 2022-07-12 DIAGNOSIS — I130 Hypertensive heart and chronic kidney disease with heart failure and stage 1 through stage 4 chronic kidney disease, or unspecified chronic kidney disease: Secondary | ICD-10-CM | POA: Diagnosis not present

## 2022-07-12 DIAGNOSIS — R0609 Other forms of dyspnea: Secondary | ICD-10-CM

## 2022-07-12 LAB — CBC WITH DIFFERENTIAL/PLATELET
Abs Immature Granulocytes: 0.01 10*3/uL (ref 0.00–0.07)
Basophils Absolute: 0.1 10*3/uL (ref 0.0–0.1)
Basophils Relative: 1 %
Eosinophils Absolute: 0.4 10*3/uL (ref 0.0–0.5)
Eosinophils Relative: 6 %
HCT: 39.7 % (ref 36.0–46.0)
Hemoglobin: 12.3 g/dL (ref 12.0–15.0)
Immature Granulocytes: 0 %
Lymphocytes Relative: 37 %
Lymphs Abs: 2.7 10*3/uL (ref 0.7–4.0)
MCH: 29.6 pg (ref 26.0–34.0)
MCHC: 31 g/dL (ref 30.0–36.0)
MCV: 95.7 fL (ref 80.0–100.0)
Monocytes Absolute: 0.5 10*3/uL (ref 0.1–1.0)
Monocytes Relative: 6 %
Neutro Abs: 3.7 10*3/uL (ref 1.7–7.7)
Neutrophils Relative %: 50 %
Platelets: 237 10*3/uL (ref 150–400)
RBC: 4.15 MIL/uL (ref 3.87–5.11)
RDW: 13.2 % (ref 11.5–15.5)
WBC: 7.3 10*3/uL (ref 4.0–10.5)
nRBC: 0 % (ref 0.0–0.2)

## 2022-07-12 LAB — URINALYSIS, W/ REFLEX TO CULTURE (INFECTION SUSPECTED)
Bilirubin Urine: NEGATIVE
Glucose, UA: NEGATIVE mg/dL
Ketones, ur: NEGATIVE mg/dL
Nitrite: NEGATIVE
Protein, ur: 100 mg/dL — AB
Specific Gravity, Urine: 1.013 (ref 1.005–1.030)
WBC, UA: >50 WBC/hpf (ref 0–5)
pH: 5 (ref 5.0–8.0)

## 2022-07-12 LAB — COMPREHENSIVE METABOLIC PANEL
ALT: 13 U/L (ref 0–44)
AST: 16 U/L (ref 15–41)
Albumin: 3.5 g/dL (ref 3.5–5.0)
Alkaline Phosphatase: 77 U/L (ref 38–126)
Anion gap: 7 (ref 5–15)
BUN: 49 mg/dL — ABNORMAL HIGH (ref 8–23)
CO2: 21 mmol/L — ABNORMAL LOW (ref 22–32)
Calcium: 9.1 mg/dL (ref 8.9–10.3)
Chloride: 106 mmol/L (ref 98–111)
Creatinine, Ser: 2.22 mg/dL — ABNORMAL HIGH (ref 0.44–1.00)
GFR, Estimated: 21 mL/min — ABNORMAL LOW (ref >60)
Glucose, Bld: 112 mg/dL — ABNORMAL HIGH (ref 70–99)
Potassium: 4.8 mmol/L (ref 3.5–5.1)
Sodium: 134 mmol/L — ABNORMAL LOW (ref 135–145)
Total Bilirubin: 0.5 mg/dL (ref 0.3–1.2)
Total Protein: 7.5 g/dL (ref 6.5–8.1)

## 2022-07-12 LAB — LIPASE, BLOOD: Lipase: 34 U/L (ref 11–51)

## 2022-07-12 LAB — LAB REPORT - SCANNED
Creatinine, POC: 68.4 mg/dL
EGFR: 24

## 2022-07-12 LAB — TROPONIN I (HIGH SENSITIVITY)
Troponin I (High Sensitivity): 7 ng/L (ref <18)
Troponin I (High Sensitivity): 10 ng/L (ref <18)

## 2022-07-12 LAB — TSH: TSH: 0.116 u[IU]/mL — ABNORMAL LOW (ref 0.350–4.500)

## 2022-07-12 LAB — BRAIN NATRIURETIC PEPTIDE: B Natriuretic Peptide: 116 pg/mL — ABNORMAL HIGH (ref 0.0–100.0)

## 2022-07-12 MED ORDER — CEPHALEXIN 500 MG PO CAPS: 500.0000 mg | ORAL_CAPSULE | Freq: Two times a day (BID) | ORAL | 0 refills | Status: DC

## 2022-07-12 MED ORDER — SODIUM CHLORIDE 0.9 % IV SOLN
1.0000 g | Freq: Once | INTRAVENOUS | Status: AC
  Administered 2022-07-12: 1 g via INTRAVENOUS
  Filled 2022-07-12: qty 10

## 2022-07-12 NOTE — ED Provider Triage Note (Signed)
Emergency Medicine Provider Triage Evaluation Note  [name] , a 87 y.o. female  was evaluated in triage.  Pt complains of persistent nausea, generalized weakness, and burning with urination.  States her nausea has been going for 4 months, having difficulty with eating decreased appetite due to her nausea.  States she has had a Nissen fundoplication that prevents her from vomiting.  She is concerned that she has a UTI.  She complains of pain of her upper abdomen as well.  Denies any fever, chest pain, shortness of breath.  Was recommended to follow-up with GI but patient has not done this  Review of Systems  Positive: Nausea, upper abdominal pain, burning with urination, generalized weakness Negative: Fever, vomiting, diarrhea, chest pain or shortness of breath  Physical Exam  BP (!) 176/100 (BP Location: Left Arm)   Pulse (!) 59   Temp 97.9 F (36.6 C)   Resp 17   Ht 5' (1.524 m)   Wt 47.6 kg   SpO2 96%   BMI 20.51 kg/m  Gen:   Awake, no distress   Resp:  Normal effort  MSK:   Moves extremities without difficulty  Other:    Medical Decision Making  Medically screening exam initiated at 4:57 PM.  Appropriate orders placed.  [name] was informed that the remainder of the evaluation will be completed by another provider, this initial triage assessment does not replace that evaluation, and the importance of remaining in the ED until their evaluation is complete.     Pauline Aus, PA-C 07/12/22 1700

## 2022-07-12 NOTE — ED Notes (Addendum)
Up to [facility] and pulse ox 93%  states that as far as she walks at home.  Would not walk any farther.  No SOB noted

## 2022-07-12 NOTE — ED Notes (Signed)
ED Provider at bedside. 

## 2022-07-12 NOTE — ED Provider Notes (Signed)
[place] EMERGENCY DEPARTMENT AT [facility] Provider Note   CSN: [id number] Arrival date & time: 07/12/22  1459     History  Chief Complaint  Patient presents with   Nausea    "Can't eat due to nausea"     NORVELLA LOSCALZO is a 87 y.o. female.  HPI     87 year old patient comes in with chief complaint of generalized weakness, nausea, poor appetite.  Patient accompanied by son, provides substantial history.  According to the patient, over the last 2 weeks or so she has worsened.  She has not been feeling great for the last 6 months or so, but more recently she is having nausea without vomiting and poor appetite.  In the last 2 weeks or so she has had burning with urination.  Patient denies any abdominal pain.  Patient has history of esophageal strictures, she is supposed to see a GI doctor but does not have a follow-up at this time.  Patient also indicates that she is having shortness of breath with exertion.  She lives in a mobile home and walking on the hallway will get short of breath.  The symptoms are new since the new year.  Patient talk to her PCP or another doctor, and advised that she just come to the emergency room for further assessment.   Home Medications Prior to Admission medications   Medication Sig Start Date End Date Taking? Authorizing Provider  albuterol (VENTOLIN HFA) 108 (90 Base) MCG/ACT inhaler Inhale 1-2 puffs into the lungs every 6 (six) hours as needed for wheezing or shortness of breath. 12/28/20  Yes Anabel Halon, MD  atorvastatin (LIPITOR) 20 MG tablet Take 1 tablet (20 mg total) by mouth daily. 03/03/22  Yes Sharlene Dory, NP  carvedilol (COREG) 6.25 MG tablet TAKE (1) TABLET BY MOUTH TWICE DAILY. Patient taking differently: Take 6.25 mg by mouth 2 (two) times daily with a meal. 04/24/22  Yes Anabel Halon, MD  chlorthalidone (HYGROTON) 25 MG tablet Take 12.5 mg by mouth daily. 05/23/21  Yes [provider]   ipratropium-albuterol (DUONEB) 0.5-2.5 (3) MG/3ML SOLN Take 3 mLs by nebulization every 6 (six) hours as needed. 08/11/21  Yes Anabel Halon, MD  levothyroxine (SYNTHROID) 88 MCG tablet TAKE (1) TABLET BY MOUTH EACH MORNING. Patient taking differently: Take 88 mcg by mouth daily before breakfast. 04/24/22  Yes Anabel Halon, MD  mirtazapine (REMERON) 7.5 MG tablet Take 1 tablet (7.5 mg total) by mouth at bedtime. 01/25/22  Yes Anabel Halon, MD  omeprazole (PRILOSEC) 40 MG capsule Take 1 capsule (40 mg total) by mouth daily. 01/25/22  Yes Anabel Halon, MD  ondansetron (ZOFRAN) 4 MG tablet TAKE 1 TABLET BY MOUTH THREE TIMES AS NEEDED FOR NAUSEA. Patient taking differently: Take 4 mg by mouth every 8 (eight) hours as needed for vomiting or nausea. 03/16/22  Yes Anabel Halon, MD  oxyCODONE-acetaminophen (PERCOCET) 7.5-325 MG tablet Take 1 tablet by mouth 3 (three) times daily as needed. 06/29/22  Yes [provider]  fluticasone-salmeterol (ADVAIR) 100-50 MCG/ACT AEPB Inhale 1 puff into the lungs 2 (two) times daily. Patient not taking: Reported on 07/12/2022 03/16/22   Anabel Halon, MD  pantoprazole (PROTONIX) 40 MG tablet TAKE (1) TABLET BY MOUTH ONCE DAILY. Patient not taking: Reported on 07/12/2022 05/25/22   Anabel Halon, MD  UNABLE TO FIND Wheelchair (small) - OA and physical deconditioning (M19.90, R53.81) 12/28/20   Anabel Halon, MD  Ardelia Mems  TO FIND Hand held shower head with long cord 01/25/21   Anabel Halon, MD  UNABLE TO FIND 1 each by Does not apply route daily. Med Name: Raised toilet seat with arm rests DX Code- R53.81 04/28/22   Anabel Halon, MD      Allergies    Hydralazine, Nalbuphine, Paroxetine, Sulfa antibiotics, and Venlafaxine    Review of Systems   Review of Systems  All other systems reviewed and are negative.   Physical Exam Updated Vital Signs BP (!) 196/83   Pulse (!) 58   Temp 97.9 F (36.6 C)   Resp 18   Ht 5' (1.524 m)   Wt 47.6 kg    SpO2 94%   BMI 20.51 kg/m  Physical Exam Vitals and nursing note reviewed.  Constitutional:      Appearance: She is well-developed.  HENT:     Head: Atraumatic.  Cardiovascular:     Rate and Rhythm: Normal rate.  Pulmonary:     Effort: Pulmonary effort is normal.  Musculoskeletal:     Cervical back: Normal range of motion and neck supple.  Skin:    General: Skin is warm and dry.  Neurological:     Mental Status: She is alert and oriented to person, place, and time.     ED Results / Procedures / Treatments   Labs (all labs ordered are listed, but only abnormal results are displayed) Labs Reviewed  COMPREHENSIVE METABOLIC PANEL - Abnormal; Notable for the following components:      Result Value   Sodium 134 (*)    CO2 21 (*)    Glucose, Bld 112 (*)    BUN 49 (*)    Creatinine, Ser 2.22 (*)    GFR, Estimated 21 (*)    All other components within normal limits  URINALYSIS, W/ REFLEX TO CULTURE (INFECTION SUSPECTED) - Abnormal; Notable for the following components:   APPearance HAZY (*)    Hgb urine dipstick SMALL (*)    Protein, ur 100 (*)    Leukocytes,Ua LARGE (*)    Bacteria, UA RARE (*)    All other components within normal limits  TSH - Abnormal; Notable for the following components:   TSH 0.116 (*)    All other components within normal limits  BRAIN NATRIURETIC PEPTIDE - Abnormal; Notable for the following components:   B Natriuretic Peptide 116.0 (*)    All other components within normal limits  URINE CULTURE  CBC WITH DIFFERENTIAL/PLATELET  LIPASE, BLOOD  TROPONIN I (HIGH SENSITIVITY)  TROPONIN I (HIGH SENSITIVITY)    EKG None  Date: 07/12/2022  Rate: 58  Rhythm: normal sinus rhythm  QRS Axis: normal  Intervals: normal  ST/T Wave abnormalities: normal  Conduction Disutrbances: none  Narrative Interpretation: unremarkable   Radiology DG Chest Port 1 View  Result Date: 07/12/2022 CLINICAL DATA:  Shortness of breath with exertion. EXAM:  PORTABLE CHEST 1 VIEW COMPARISON:  X-ray 08/01/2021 FINDINGS: Film is rotated to the right. Hyperinflation with interstitial changes likely chronic. No consolidation, pneumothorax or edema. Enlarged cardiopericardial silhouette. Osteopenia with scattered degenerative changes. Dense right upper lobe lung nodule consistent with calcification and old granulomatous disease. Old right-sided rib fractures. IMPRESSION: Hyperinflation with chronic interstitial changes.  Enlarged heart. Electronically Signed   By: Karen Kays M.D.   On: 07/12/2022 19:39    Procedures Procedures    Medications Ordered in ED Medications  cefTRIAXone (ROCEPHIN) 1 g in sodium chloride 0.9 % 100 mL IVPB (1 g Intravenous  New Bag/Given 07/12/22 2107)    ED Course/ Medical Decision Making/ A&P                             Medical Decision Making Amount and/or Complexity of Data Reviewed Labs: ordered. Radiology: ordered.   87 year old patient comes in with chief complaint of generalized weakness that has worsened, shortness of breath with exertion that has been present now for few weeks and burning with urination.  Patient has past medical history of CKD, chronic pain syndrome, CHF.  She has nausea, but no emesis.  She is status post Nissen's fundoplication.  Differential diagnosis for this patient includes new CHF, acute coronary syndrome, pleural effusion, UTI, severe electrolyte abnormality, acute on chronic renal failure.  Plan is to get basic labs including troponins. UA also ordered.  History provided by patient's son and have also reviewed patient's records.  Final Clinical Impression(s) / ED Diagnoses Final diagnoses:  Acute cystitis without hematuria  Exertional dyspnea  Uncontrolled hypertension    Rx / DC Orders ED Discharge Orders          Ordered    Ambulatory referral to Cardiology       Comments: If you have not heard from the Cardiology office within the next 72 hours please call  [phone].   07/12/22 2140              Derwood Kaplan, MD 07/12/22 2142

## 2022-07-12 NOTE — ED Notes (Signed)
Pt awaiting dispo.

## 2022-07-12 NOTE — ED Triage Notes (Signed)
Pt presents to ED with son, whom she lives with, he got pt out of nursing home and took her to live with him, pt c/o nausea and inability to eat x 4 months that has gotten worse, pt was told to f/u with GI specialist, but pt has not, pt takes zofran for nausea and it helps but only lasts for a few hours. No vomiting reported, says she "can't throw up", reports bad acid reflux.

## 2022-07-12 NOTE — Discharge Instructions (Signed)
You were seen in the emergency room for weakness and shortness of breath and burning with urination.  The workup in the emergency room is revealing bladder infection, which could be the reason why you are feeling weak and tired.  It is unclear why you are feeling short of breath.  Cardiac workup is normal.  Your BP is elevated, we recommend that you discuss blood pressure management with your PCP.  We have placed a referral to the cardiology service to see if your heart is the reason for your shortness of breath.

## 2022-07-12 NOTE — ED Notes (Signed)
Pt states she has not took her BP med today

## 2022-07-14 LAB — URINE CULTURE

## 2022-07-17 ENCOUNTER — Telehealth: Payer: Self-pay

## 2022-07-17 NOTE — Telephone Encounter (Signed)
     Patient  visit on 4/17  at [facility]    Have you been able to follow up with your primary care physician? Yes   The patient was or was not able to obtain any needed medicine or equipment. Yes   Are there diet recommendations that you are having difficulty following? Na   Patient expresses understanding of discharge instructions and education provided has no other needs at this time.  Yes     Lenard Forth [facility] Guide, [facility] [id number] [address] [place], [place], [place] [address] Phone: [phone] Email: [email or web address]

## 2022-07-24 ENCOUNTER — Other Ambulatory Visit: Payer: Self-pay | Admitting: Internal Medicine

## 2022-07-24 DIAGNOSIS — E039 Hypothyroidism, unspecified: Secondary | ICD-10-CM

## 2022-07-27 ENCOUNTER — Encounter: Payer: Self-pay | Admitting: Internal Medicine

## 2022-07-27 ENCOUNTER — Encounter (HOSPITAL_COMMUNITY): Payer: Self-pay | Admitting: Nephrology

## 2022-07-27 ENCOUNTER — Ambulatory Visit (INDEPENDENT_AMBULATORY_CARE_PROVIDER_SITE_OTHER): Payer: 59 | Admitting: Internal Medicine

## 2022-07-27 VITALS — BP 138/84 | HR 56 | Ht 60.0 in

## 2022-07-27 DIAGNOSIS — I5032 Chronic diastolic (congestive) heart failure: Secondary | ICD-10-CM

## 2022-07-27 DIAGNOSIS — F02B18 Dementia in other diseases classified elsewhere, moderate, with other behavioral disturbance: Secondary | ICD-10-CM

## 2022-07-27 DIAGNOSIS — E039 Hypothyroidism, unspecified: Secondary | ICD-10-CM | POA: Diagnosis not present

## 2022-07-27 DIAGNOSIS — B3731 Acute candidiasis of vulva and vagina: Secondary | ICD-10-CM

## 2022-07-27 DIAGNOSIS — I10 Essential (primary) hypertension: Secondary | ICD-10-CM

## 2022-07-27 DIAGNOSIS — N184 Chronic kidney disease, stage 4 (severe): Secondary | ICD-10-CM

## 2022-07-27 DIAGNOSIS — J439 Emphysema, unspecified: Secondary | ICD-10-CM | POA: Diagnosis not present

## 2022-07-27 DIAGNOSIS — E782 Mixed hyperlipidemia: Secondary | ICD-10-CM | POA: Diagnosis not present

## 2022-07-27 DIAGNOSIS — D631 Anemia in chronic kidney disease: Secondary | ICD-10-CM

## 2022-07-27 DIAGNOSIS — G309 Alzheimer's disease, unspecified: Secondary | ICD-10-CM | POA: Diagnosis not present

## 2022-07-27 DIAGNOSIS — R300 Dysuria: Secondary | ICD-10-CM | POA: Diagnosis not present

## 2022-07-27 DIAGNOSIS — N3000 Acute cystitis without hematuria: Secondary | ICD-10-CM | POA: Diagnosis not present

## 2022-07-27 LAB — POCT URINALYSIS DIP (CLINITEK)
Bilirubin, UA: NEGATIVE
Glucose, UA: NEGATIVE mg/dL
Ketones, POC UA: NEGATIVE mg/dL
Nitrite, UA: NEGATIVE
POC PROTEIN,UA: 100 — AB
Spec Grav, UA: 1.02 (ref 1.010–1.025)
Urobilinogen, UA: 0.2 E.U./dL
pH, UA: 5.5 (ref 5.0–8.0)

## 2022-07-27 MED ORDER — FLUCONAZOLE 150 MG PO TABS: 150.0000 mg | ORAL_TABLET | Freq: Once | ORAL | 0 refills | Status: AC

## 2022-07-27 MED ORDER — LEVOTHYROXINE SODIUM 75 MCG PO TABS: 75.0000 ug | ORAL_TABLET | Freq: Every day | ORAL | 1 refills | Status: DC

## 2022-07-27 MED ORDER — NITROFURANTOIN MONOHYD MACRO 100 MG PO CAPS: 100.0000 mg | ORAL_CAPSULE | Freq: Two times a day (BID) | ORAL | 0 refills | Status: DC

## 2022-07-27 NOTE — Assessment & Plan Note (Addendum)
Has had Venofer Denies any signs of active bleeding

## 2022-07-27 NOTE — Assessment & Plan Note (Signed)
On Atorvastatin 20 mg QD 

## 2022-07-27 NOTE — Patient Instructions (Addendum)
Please start taking Macrobid as prescribed. Please take Fluconazole at the end of antibiotic course.  Please continue taking medications as prescribed.  Please continue to follow low salt diet and ambulate as tolerated.

## 2022-07-27 NOTE — Assessment & Plan Note (Signed)
Chronic leg swelling and dyspnea Needs leg elevation for leg swelling On chlorthalidone currently 

## 2022-07-27 NOTE — Assessment & Plan Note (Signed)
Follows up with nephrologist Follows up with hematooncologist for evaluation of elevated light chain levels Advised to follow renal diet as advised by nephrologist and dietitian Avoid nephrotoxic agents including NSAIDs Last BMP reviewed from nephrology visit - needs follow up visit 

## 2022-07-27 NOTE — Assessment & Plan Note (Signed)
Recent ER visit for dysuria, was given Keflex, but still has dysuria and nausea UA reviewed, LE + Started empiric Macrobid Check urine culture

## 2022-07-27 NOTE — Assessment & Plan Note (Signed)
Cognitive decline with insomnia and episodes of hallucinations - at her stage, donepezil or memantine would not be helpful and will add side effects, including dizziness and hyponatremia Remeron for insomnia Needs frequent reorientation Dependent for ADLs and IADLs, has good family support

## 2022-07-27 NOTE — Assessment & Plan Note (Signed)
BP Readings from Last 1 Encounters:  07/27/22 138/84   Well-controlled with Coreg 6.25 mg twice daily and Chlorthalidone 12.5 mg QD Counseled for compliance with the medications Advised DASH diet Chronic pain and CKD also contributing to high BP Followed by Nephrologist

## 2022-07-27 NOTE — Assessment & Plan Note (Signed)
Likely due to recent antibiotic exposure °Fluconazole prescribed °

## 2022-07-27 NOTE — Assessment & Plan Note (Addendum)
Lab Results  Component Value Date   TSH 0.116 (L) 07/12/2022    Decreased dose of levothyroxine to 75 mcg QD Check TSH, free T4 Reviewed US thyroid

## 2022-07-27 NOTE — Progress Notes (Signed)
Established Patient Office Visit  Subjective:  Patient ID: [name], female    DOB: [birth year]  Age: 87 y.o. MRN: [id number]  CC:  Chief Complaint  Patient presents with   Annual Exam    Follow up on hypertension and CKD. Patient had a UTI and after the course of antibiotic she feels she has a yeast infection. She states still painful when trying to use the bathroom.    HPI [name] is a 87 y.o. female with past medical history of hypertension, CKD stage IV, fibromyalgia, chronic pelvic pain, chronic constipation, hypothyroidism, osteoarthritis of knees and depression who presents for f/u of her chronic medical conditions.  She has mild dysuria and urinary frequency for the last 2 weeks, but denies any hematuria, fever, chills or vomiting. She went to ER and was given Keflex for UTI.  Her urine culture showed multiple species.  She still has dysuria and fatigue.  Denies any fever or chills.  She has chronic nausea.  She also reports perineal area itching since taking Keflex.  HTN: Her BP is well-controlled. Followed by nephrology.  Denies any chest pain, headache or dizziness currently.  CKD stage IV: Followed by nephrology.  She complains of mild dysuria recently.  Denies any fever, chills or hematuria currently.  She recently had urine testing for CKD.  Hypothyroidism: Has been taking levothyroxine.  Her TSH was low during recent ER visit.  She has noticed lack of appetite and weight loss as well.  Denies any tremors, palpitations or jitteriness.  COPD: Her breathing has improved with Advair and as needed albuterol now.  She denies any acute worsening of dyspnea or wheezing currently.  She was placed on Remeron for insomnia and improving her appetite.  She reports mild improvement in appetite.  She has a history of dementia as well, but denies agitation.  Past Medical History:  Diagnosis Date   Anxiety disorder, unspecified    Arteriosclerotic cardiovascular disease  (ASCVD) 2006   Nonobstructive; < 50% lesions on cath 2002; negative stress nuclear study in 10/2004   Asthma    Chest pain, unspecified    Chronic kidney disease, unspecified    Chronic obstructive pulmonary disease, unspecified (HCC)    Chronic pelvic pain in female    Colitis due to Clostridium difficile 1994   1994   COPD (chronic obstructive pulmonary disease) (HCC)    Depression with anxiety    Diarrhea, unspecified    Disorder of thyroid, unspecified    Gastro-esophageal reflux disease without esophagitis    Gastroesophageal reflux disease    Hyperlipidemia    Hyperlipidemia, unspecified    Hypertension    Hypertensive chronic kidney disease with stage 1 through stage 4 chronic kidney disease, or unspecified chronic kidney disease    Hyperthyroidism    Hypothyroidism    Hypothyroidism, unspecified    Kidney disease, chronic, stage III (moderate, EGFR 30-59 ml/min) (HCC)    Lower abdominal pain, unspecified    Major depressive disorder, single episode, severe without psychotic features (HCC)    Major depressive disorder, single episode, unspecified    Nausea    Other postherpetic nervous system involvement    Pelvic and perineal pain    Rash and other nonspecific skin eruption    S/P colonoscopy 2007   few diverticula, otherwise nl   S/P colonoscopy 12/13/10   rectal, cecal polyp, left-sided diverticulosis, hyperplastic. ?anal fissure? treated empirically   S/P endoscopy 2009   linear esophageal erosions   S/P endoscopy  05/10/10   Question island of salmon-colored epithelium in distal esophagus ; no Barrett's.    Shingles    Tobacco abuse, in remission    55-pack-year consumption; discontinued 08/2010   Unspecified asthma, uncomplicated    Unspecified osteoarthritis, unspecified site    Urinary tract infection, site not specified     Past Surgical History:  Procedure Laterality Date   ABDOMINAL HYSTERECTOMY     BREAST LUMPECTOMY     CATARACT EXTRACTION      CHOLECYSTECTOMY  1962   COLONOSCOPY  12/13/2010   Left-sided diverticulosis.  Cecal polyp, status post hot snare polypectomy/ Diminutive rectal polyp, status post cold biopsy removal tender/painful anal canal, ? occult anal fissure- Not visualized   COLONOSCOPY N/A 10/14/2015   Diverticulosis   ESOPHAGOGASTRODUODENOSCOPY  05/10/10   benign mucosa with mild chronic inflammation.   ESOPHAGOGASTRODUODENOSCOPY (EGD) WITH PROPOFOL N/A 02/13/2019   Procedure: ESOPHAGOGASTRODUODENOSCOPY (EGD) WITH PROPOFOL;  Surgeon: Corbin Ade, MD;  Location: [facility];  Service: Endoscopy;  Laterality: N/A;  3:00pm   FLEXIBLE SIGMOIDOSCOPY  12/29/2011   Procedure: FLEXIBLE SIGMOIDOSCOPY;  Surgeon: Malissa Hippo, MD;  Location: [facility];  Service: Endoscopy;  Laterality: N/A;  200-Ann notified pt to be here @ 1:00   NISSEN FUNDOPLICATION     YAG LASER APPLICATION Left 05/25/2014   Procedure: YAG LASER APPLICATION;  Surgeon: Susa Simmonds, MD;  Location: [facility];  Service: Ophthalmology;  Laterality: Left;    Family History  Problem Relation Age of Onset   Diabetes Mother    Alzheimer's disease Mother    Cancer Mother    Heart disease Father    Cancer Sister    Heart disease Brother        Also mother, Father, brother and 2 sons   Cancer Son        testicular cancer    Aneurysm Son    Diabetes Son    Hypertension Brother        also Sister x2   Colon cancer Neg Hx     Social History   Socioeconomic History   Marital status: Married    Spouse name: Not on file   Number of children: 4   Years of education: Not on file   Highest education level: Not on file  Occupational History   Not on file  Tobacco Use   Smoking status: Former    Packs/day: 1.00    Years: 55.00    Additional pack years: 0.00    Total pack years: 55.00    Types: Cigarettes    Quit date: 09/15/2010    Years since quitting: 11.8   Smokeless tobacco: Never  Vaping Use   Vaping Use: Never used  Substance and  Sexual Activity   Alcohol use: No    Alcohol/week: 0.0 standard drinks of alcohol   Drug use: No   Sexual activity: Not Currently  Other Topics Concern   Not on file  Social History Narrative   Not on file   Social Determinants of Health   Financial Resource Strain: Low Risk  (02/07/2022)   Overall Financial Resource Strain (CARDIA)    Difficulty of Paying Living Expenses: Not hard at all  Food Insecurity: No Food Insecurity (02/07/2022)   Hunger Vital Sign    Worried About Running Out of Food in the Last Year: Never true    Ran Out of Food in the Last Year: Never true  Transportation Needs: No Transportation Needs (02/07/2022)   PRAPARE -  Administrator, Civil Service (Medical): No    Lack of Transportation (Non-Medical): No  Physical Activity: Inactive (09/06/2020)   Exercise Vital Sign    Days of Exercise per Week: 0 days    Minutes of Exercise per Session: 0 min  Stress: No Stress Concern Present (02/07/2022)   [employer] of Occupational Health - Occupational Stress Questionnaire    Feeling of Stress : Not at all  Social Connections: Socially Isolated (02/07/2022)   Social Connection and Isolation Panel [NHANES]    Frequency of Communication with Friends and Family: More than three times a week    Frequency of Social Gatherings with Friends and Family: Once a week    Attends Religious Services: Never    Database administrator or Organizations: No    Attends Banker Meetings: Never    Marital Status: Separated  Intimate Partner Violence: Not At Risk (09/06/2020)   Humiliation, Afraid, Rape, and Kick questionnaire    Fear of Current or Ex-Partner: No    Emotionally Abused: No    Physically Abused: No    Sexually Abused: No    Outpatient Medications Prior to Visit  Medication Sig Dispense Refill   albuterol (VENTOLIN HFA) 108 (90 Base) MCG/ACT inhaler Inhale 1-2 puffs into the lungs every 6 (six) hours as needed for wheezing or shortness  of breath. 8 g 5   atorvastatin (LIPITOR) 20 MG tablet Take 1 tablet (20 mg total) by mouth daily. 90 tablet 1   carvedilol (COREG) 6.25 MG tablet TAKE (1) TABLET BY MOUTH TWICE DAILY. (Patient taking differently: Take 6.25 mg by mouth 2 (two) times daily with a meal.) 180 tablet 0   chlorthalidone (HYGROTON) 25 MG tablet Take 12.5 mg by mouth daily.     fluticasone-salmeterol (ADVAIR) 100-50 MCG/ACT AEPB Inhale 1 puff into the lungs 2 (two) times daily. (Patient not taking: Reported on 07/12/2022) 60 each 0   ipratropium-albuterol (DUONEB) 0.5-2.5 (3) MG/3ML SOLN Take 3 mLs by nebulization every 6 (six) hours as needed. 360 mL 2   mirtazapine (REMERON) 7.5 MG tablet Take 1 tablet (7.5 mg total) by mouth at bedtime. 30 tablet 5   omeprazole (PRILOSEC) 40 MG capsule Take 1 capsule (40 mg total) by mouth daily. 90 capsule 3   ondansetron (ZOFRAN) 4 MG tablet TAKE 1 TABLET BY MOUTH THREE TIMES AS NEEDED FOR NAUSEA. (Patient taking differently: Take 4 mg by mouth every 8 (eight) hours as needed for vomiting or nausea.) 90 tablet 0   oxyCODONE-acetaminophen (PERCOCET) 7.5-325 MG tablet Take 1 tablet by mouth 3 (three) times daily as needed.     pantoprazole (PROTONIX) 40 MG tablet TAKE (1) TABLET BY MOUTH ONCE DAILY. (Patient not taking: Reported on 07/12/2022) 90 tablet 0   UNABLE TO FIND Wheelchair (small) - OA and physical deconditioning (M19.90, R53.81) 1 each 0   UNABLE TO FIND Hand held shower head with long cord 1 each 0   UNABLE TO FIND 1 each by Does not apply route daily. Med Name: Raised toilet seat with arm rests DX Code- R53.81 1 each 0   cephALEXin (KEFLEX) 500 MG capsule Take 1 capsule (500 mg total) by mouth 2 (two) times daily. 14 capsule 0   levothyroxine (SYNTHROID) 88 MCG tablet TAKE (1) TABLET BY MOUTH EACH MORNING. 90 tablet 0   No facility-administered medications prior to visit.    Allergies  Allergen Reactions   Hydralazine Other (See Comments)    Breathing   Nalbuphine  Rash   Paroxetine Itching   Sulfa Antibiotics Other (See Comments)    Childhood reaction.   Venlafaxine Itching    ROS Review of Systems  Constitutional:  Positive for fatigue. Negative for appetite change, chills and fever.  HENT:  Negative for congestion, sinus pressure, sinus pain and sore throat.   Eyes:  Negative for pain and discharge.  Respiratory:  Positive for shortness of breath. Negative for cough.   Cardiovascular:  Positive for leg swelling. Negative for chest pain and palpitations.  Gastrointestinal:  Negative for abdominal pain, diarrhea, nausea and vomiting.  Endocrine: Negative for polydipsia and polyuria.  Genitourinary:  Positive for dysuria and frequency. Negative for hematuria.  Musculoskeletal:  Positive for arthralgias and back pain. Negative for neck pain and neck stiffness.  Skin:  Negative for rash.  Neurological:  Negative for dizziness and weakness.  Psychiatric/Behavioral:  Positive for sleep disturbance. Negative for agitation and behavioral problems.       Objective:    Physical Exam Vitals reviewed.  Constitutional:      General: She is not in acute distress.    Appearance: She is not diaphoretic.     Comments: In wheelchair  HENT:     Head: Normocephalic and atraumatic.     Nose: Nose normal.     Mouth/Throat:     Mouth: Mucous membranes are moist.  Eyes:     General: No scleral icterus.    Extraocular Movements: Extraocular movements intact.  Cardiovascular:     Rate and Rhythm: Normal rate and regular rhythm.     Heart sounds: Normal heart sounds. No murmur heard. Pulmonary:     Breath sounds: Normal breath sounds. No wheezing or rales.  Abdominal:     Palpations: Abdomen is soft.     Tenderness: There is no abdominal tenderness.  Musculoskeletal:        General: Swelling (Right knee, no warmth) present.     Cervical back: Neck supple. No tenderness.     Right lower leg: No edema.     Left lower leg: No edema.  Skin:    General:  Skin is warm.     Findings: Erythema (Senile purpuric lesions (b/l UE)) present.  Neurological:     General: No focal deficit present.     Mental Status: She is alert and oriented to person, place, and time. Mental status is at baseline.     Sensory: No sensory deficit.     Motor: Weakness (3+ in b/l UE and LE) present.     Gait: Gait abnormal.  Psychiatric:        Mood and Affect: Mood normal.        Behavior: Behavior normal.     BP 138/84 (BP Location: Left Arm)   Pulse (!) 56   Ht 5' (1.524 m)   SpO2 94%   BMI 20.51 kg/m  Wt Readings from Last 3 Encounters:  07/12/22 105 lb (47.6 kg)  03/03/22 103 lb (46.7 kg)  10/31/21 104 lb (47.2 kg)    Lab Results  Component Value Date   TSH 0.116 (L) 07/12/2022   Lab Results  Component Value Date   WBC 7.3 07/12/2022   HGB 12.3 07/12/2022   HCT 39.7 07/12/2022   MCV 95.7 07/12/2022   PLT 237 07/12/2022   Lab Results  Component Value Date   NA 134 (L) 07/12/2022   K 4.8 07/12/2022   CO2 21 (L) 07/12/2022   GLUCOSE 112 (H) 07/12/2022   BUN 49 (  H) 07/12/2022   CREATININE 2.22 (H) 07/12/2022   BILITOT 0.5 07/12/2022   ALKPHOS 77 07/12/2022   AST 16 07/12/2022   ALT 13 07/12/2022   PROT 7.5 07/12/2022   ALBUMIN 3.5 07/12/2022   CALCIUM 9.1 07/12/2022   ANIONGAP 7 07/12/2022   EGFR 22 (L) 10/17/2021   Lab Results  Component Value Date   CHOL 164 09/17/2018   Lab Results  Component Value Date   HDL 44 09/17/2018   Lab Results  Component Value Date   LDLCALC 95 09/17/2018   Lab Results  Component Value Date   TRIG 145 09/17/2018   Lab Results  Component Value Date   CHOLHDL 2.9 07/15/2008   Lab Results  Component Value Date   HGBA1C 5.3 06/14/2020      Assessment & Plan:   Problem List Items Addressed This Visit       Cardiovascular and Mediastinum   Primary hypertension - Primary    BP Readings from Last 1 Encounters:  07/27/22 138/84  Well-controlled with Coreg 6.25 mg twice daily and  Chlorthalidone 12.5 mg QD Counseled for compliance with the medications Advised DASH diet Chronic pain and CKD also contributing to high BP Followed by Nephrologist      (HFpEF) heart failure with preserved ejection fraction (HCC)    Chronic leg swelling and dyspnea Needs leg elevation for leg swelling On chlorthalidone currently        Respiratory   Chronic obstructive pulmonary disease, unspecified (HCC)    Well controlled now with Advair - needs to rinse mouth after each use Has Duoneb for better compliance and has had better response with nebulizer during ER visits Educated about use of maintenance therapy and rescue inhaler        Endocrine   Hypothyroidism    Lab Results  Component Value Date   TSH 0.116 (L) 07/12/2022   Decreased dose of levothyroxine to 75 mcg QD Check TSH, free T4 Reviewed US thyroid      Relevant Medications   levothyroxine (SYNTHROID) 75 MCG tablet   Other Relevant Orders   TSH + free T4     Nervous and Auditory   Moderate Alzheimer's dementia (HCC)    Cognitive decline with insomnia and episodes of hallucinations - at her stage, donepezil or memantine would not be helpful and will add side effects, including dizziness and hyponatremia Remeron for insomnia Needs frequent reorientation Dependent for ADLs and IADLs, has good family support        Genitourinary   CKD (chronic kidney disease) stage 4, GFR 15-29 ml/min (HCC)    Follows up with nephrologist Follows up with hematooncologist for evaluation of elevated light chain levels Advised to follow renal diet as advised by nephrologist and dietitian Avoid nephrotoxic agents including NSAIDs Last BMP reviewed from nephrology visit - needs follow up visit      Acute cystitis without hematuria    Recent ER visit for dysuria, was given Keflex, but still has dysuria and nausea UA reviewed, LE + Started empiric Macrobid Check urine culture      Relevant Medications   nitrofurantoin,  macrocrystal-monohydrate, (MACROBID) 100 MG capsule   Other Relevant Orders   POCT URINALYSIS DIP (CLINITEK) (Completed)   Urine Culture   Candidal vaginitis    Likely due to recent antibiotic exposure Fluconazole prescribed      Relevant Medications   nitrofurantoin, macrocrystal-monohydrate, (MACROBID) 100 MG capsule   fluconazole (DIFLUCAN) 150 MG tablet   Anemia of chronic renal failure  Has had Venofer Denies any signs of active bleeding        Other   Hyperlipidemia    On Atorvastatin 20 mg QD      Relevant Orders   Lipid Profile    Meds ordered this encounter  Medications   levothyroxine (SYNTHROID) 75 MCG tablet    Sig: Take 1 tablet (75 mcg total) by mouth daily before breakfast.    Dispense:  90 tablet    Refill:  1    Dose change - 07/27/22   nitrofurantoin, macrocrystal-monohydrate, (MACROBID) 100 MG capsule    Sig: Take 1 capsule (100 mg total) by mouth 2 (two) times daily.    Dispense:  10 capsule    Refill:  0   fluconazole (DIFLUCAN) 150 MG tablet    Sig: Take 1 tablet (150 mg total) by mouth once for 1 dose.    Dispense:  1 tablet    Refill:  0    Follow-up: Return in about 6 months (around 01/27/2023) for CKD and hypothyroidism.    Anabel Halon, MD

## 2022-07-27 NOTE — Assessment & Plan Note (Signed)
Well controlled now with Advair - needs to rinse mouth after each use Has Duoneb for better compliance and has had better response with nebulizer during ER visits Educated about use of maintenance therapy and rescue inhaler

## 2022-07-29 LAB — URINE CULTURE

## 2022-07-31 ENCOUNTER — Other Ambulatory Visit: Payer: Self-pay | Admitting: Internal Medicine

## 2022-07-31 DIAGNOSIS — K219 Gastro-esophageal reflux disease without esophagitis: Secondary | ICD-10-CM

## 2022-08-11 ENCOUNTER — Other Ambulatory Visit: Payer: Self-pay | Admitting: Internal Medicine

## 2022-08-11 DIAGNOSIS — G4701 Insomnia due to medical condition: Secondary | ICD-10-CM

## 2022-08-13 NOTE — Progress Notes (Signed)
GI Office Note    Referring Provider: Anabel Halon, MD Primary Care Physician:  Anabel Halon, MD  Primary Gastroenterologist: Roetta Sessions, MD   Chief Complaint   Chief Complaint  Patient presents with   Nausea     History of Present Illness   [name] is a 87 y.o. female presenting today for nausea. Presents with her son, whom she has been living with for 2 years. Patient last seen by [place] in 10/2018.  Has a history of chronic constipation nausea, dyspepsia, GERD.  Completed EGD in November 2020 showing intact Nissen fundoplication. It was felt that chronic opioid use could be contributing to her dyspepsia and nausea.  Patient presents now with several months of nausea. Unable to vomit due to prior fundoplication. At times appetite is good but as soon as she tries to eat she gets nauseated. Some days she can eat well. She is maintaining weight. Some heartburn, no dysphagia. Has been on omeprazole chronically. Eats bites at a time. BM daily, soft/firm. No melena, brbpr. Some regurgitation. No abdominal pain. Taking zofran every six hours for nausea.   Complains of recurrent dysuria. She has had couple of unremarkable urine cultures in the past two months. Offered to run U/A with culture but since we don't collect specimen in the office she states she wants to see her PCP for this. Recently treated with Keflex and then Macrobid. Also treated with diflucan for suspected candidal vaginitis.   EGD 01/2019: -intact Nissen fundoplication, otherwise normal exam   Medications   Current Outpatient Medications  Medication Sig Dispense Refill   albuterol (VENTOLIN HFA) 108 (90 Base) MCG/ACT inhaler Inhale 1-2 puffs into the lungs every 6 (six) hours as needed for wheezing or shortness of breath. 8 g 5   atorvastatin (LIPITOR) 20 MG tablet Take 1 tablet (20 mg total) by mouth daily. 90 tablet 1   carvedilol (COREG) 6.25 MG tablet TAKE (1) TABLET BY MOUTH TWICE DAILY. (Patient  taking differently: Take 6.25 mg by mouth 2 (two) times daily with a meal.) 180 tablet 0   chlorthalidone (HYGROTON) 25 MG tablet Take 12.5 mg by mouth daily.     ipratropium-albuterol (DUONEB) 0.5-2.5 (3) MG/3ML SOLN Take 3 mLs by nebulization every 6 (six) hours as needed. 360 mL 2   levothyroxine (SYNTHROID) 75 MCG tablet Take 1 tablet (75 mcg total) by mouth daily before breakfast. 90 tablet 1   mirtazapine (REMERON) 7.5 MG tablet TAKE (1) TABLET BY MOUTH AT BEDTIME. 30 tablet 0   ondansetron (ZOFRAN) 4 MG tablet Take 1 tablet (4 mg total) by mouth every 8 (eight) hours as needed for vomiting or nausea. 90 tablet 0   oxyCODONE-acetaminophen (PERCOCET) 7.5-325 MG tablet Take 1 tablet by mouth 3 (three) times daily as needed.     pantoprazole (PROTONIX) 40 MG tablet Take 1 tablet (40 mg total) by mouth 2 (two) times daily before a meal. Before breakfast and evening meal 180 tablet 0   No current facility-administered medications for this visit.    Allergies   Allergies as of 08/14/2022 - Review Complete 08/14/2022  Allergen Reaction Noted   Hydralazine Other (See Comments) 07/17/2019   Nalbuphine Rash    Paroxetine Itching    Sulfa antibiotics Other (See Comments) 12/13/2011   Venlafaxine Itching     Past Medical History   Past Medical History:  Diagnosis Date   Anxiety disorder, unspecified    Arteriosclerotic cardiovascular disease (ASCVD) 2006   Nonobstructive; <  50% lesions on cath 2002; negative stress nuclear study in 10/2004   Asthma    Chest pain, unspecified    Chronic kidney disease, unspecified    Chronic obstructive pulmonary disease, unspecified (HCC)    Chronic pelvic pain in female    Colitis due to Clostridium difficile 1994   1994   COPD (chronic obstructive pulmonary disease) (HCC)    Depression with anxiety    Diarrhea, unspecified    Disorder of thyroid, unspecified    Gastro-esophageal reflux disease without esophagitis    Gastroesophageal reflux  disease    Hyperlipidemia    Hyperlipidemia, unspecified    Hypertension    Hypertensive chronic kidney disease with stage 1 through stage 4 chronic kidney disease, or unspecified chronic kidney disease    Hyperthyroidism    Hypothyroidism    Hypothyroidism, unspecified    Kidney disease, chronic, stage III (moderate, EGFR 30-59 ml/min) (HCC)    Lower abdominal pain, unspecified    Major depressive disorder, single episode, severe without psychotic features (HCC)    Major depressive disorder, single episode, unspecified    Nausea    Other postherpetic nervous system involvement    Pelvic and perineal pain    Rash and other nonspecific skin eruption    S/P colonoscopy 2007   few diverticula, otherwise nl   S/P colonoscopy 12/13/10   rectal, cecal polyp, left-sided diverticulosis, hyperplastic. ?anal fissure? treated empirically   S/P endoscopy 2009   linear esophageal erosions   S/P endoscopy 05/10/10   Question island of salmon-colored epithelium in distal esophagus ; no Barrett's.    Shingles    Tobacco abuse, in remission    55-pack-year consumption; discontinued 08/2010   Unspecified asthma, uncomplicated    Unspecified osteoarthritis, unspecified site    Urinary tract infection, site not specified     Past Surgical History   Past Surgical History:  Procedure Laterality Date   ABDOMINAL HYSTERECTOMY     BREAST LUMPECTOMY     CATARACT EXTRACTION     CHOLECYSTECTOMY  1962   COLONOSCOPY  12/13/2010   Left-sided diverticulosis.  Cecal polyp, status post hot snare polypectomy/ Diminutive rectal polyp, status post cold biopsy removal tender/painful anal canal, ? occult anal fissure- Not visualized   COLONOSCOPY N/A 10/14/2015   Diverticulosis   ESOPHAGOGASTRODUODENOSCOPY  05/10/10   benign mucosa with mild chronic inflammation.   ESOPHAGOGASTRODUODENOSCOPY (EGD) WITH PROPOFOL N/A 02/13/2019   Procedure: ESOPHAGOGASTRODUODENOSCOPY (EGD) WITH PROPOFOL;  Surgeon: Corbin Ade,  MD;  [facility]: [facility];  Service: Endoscopy;  Laterality: N/A;  3:00pm   FLEXIBLE SIGMOIDOSCOPY  12/29/2011   Procedure: FLEXIBLE SIGMOIDOSCOPY;  Surgeon: Malissa Hippo, MD;  Location: [facility];  Service: Endoscopy;  Laterality: N/A;  200-Ann notified pt to be here @ 1:00   NISSEN FUNDOPLICATION     YAG LASER APPLICATION Left 05/25/2014   Procedure: YAG LASER APPLICATION;  Surgeon: Susa Simmonds, MD;  Location: [facility];  Service: Ophthalmology;  Laterality: Left;    Past Family History   Family History  Problem Relation Age of Onset   Diabetes Mother    Alzheimer's disease Mother    Cancer Mother    Heart disease Father    Cancer Sister    Heart disease Brother        Also mother, Father, brother and 2 sons   Cancer Son        testicular cancer    Aneurysm Son    Diabetes Son    Hypertension  Brother        also Sister x2   Colon cancer Neg Hx     Past Social History   Social History   Socioeconomic History   Marital status: Married    Spouse name: Not on file   Number of children: 4   Years of education: Not on file   Highest education level: Not on file  Occupational History   Not on file  Tobacco Use   Smoking status: Former    Packs/day: 1.00    Years: 55.00    Additional pack years: 0.00    Total pack years: 55.00    Types: Cigarettes    Quit date: 09/15/2010    Years since quitting: 11.9   Smokeless tobacco: Never  Vaping Use   Vaping Use: Never used  Substance and Sexual Activity   Alcohol use: No    Alcohol/week: 0.0 standard drinks of alcohol   Drug use: No   Sexual activity: Not Currently  Other Topics Concern   Not on file  Social History Narrative   Not on file   Social Determinants of Health   Financial Resource Strain: Low Risk  (02/07/2022)   Overall Financial Resource Strain (CARDIA)    Difficulty of Paying Living Expenses: Not hard at all  Food Insecurity: No Food Insecurity (02/07/2022)   Hunger Vital Sign    Worried  About Running Out of Food in the Last Year: Never true    Ran Out of Food in the Last Year: Never true  Transportation Needs: No Transportation Needs (02/07/2022)   PRAPARE - Administrator, Civil Service (Medical): No    Lack of Transportation (Non-Medical): No  Physical Activity: Inactive (09/06/2020)   Exercise Vital Sign    Days of Exercise per Week: 0 days    Minutes of Exercise per Session: 0 min  Stress: No Stress Concern Present (02/07/2022)   [employer] of Occupational Health - Occupational Stress Questionnaire    Feeling of Stress : Not at all  Social Connections: Socially Isolated (02/07/2022)   Social Connection and Isolation Panel [NHANES]    Frequency of Communication with Friends and Family: More than three times a week    Frequency of Social Gatherings with Friends and Family: Once a week    Attends Religious Services: Never    Database administrator or Organizations: No    Attends Banker Meetings: Never    Marital Status: Separated  Intimate Partner Violence: Not At Risk (09/06/2020)   Humiliation, Afraid, Rape, and Kick questionnaire    Fear of Current or Ex-Partner: No    Emotionally Abused: No    Physically Abused: No    Sexually Abused: No    Review of Systems   General: Negative for  weight loss, fever, chills, fatigue, +weakness. Poor appetite some days. Eyes: Negative for vision changes.  ENT: Negative for hoarseness, difficulty swallowing , nasal congestion. CV: Negative for chest pain, angina, palpitations, dyspnea on exertion, peripheral edema.  Respiratory: Negative for dyspnea at rest, dyspnea on exertion, cough, sputum, wheezing.  GI: See history of present illness. GU:  Negative for dysuria, hematuria, urinary incontinence, urinary frequency, nocturnal urination.  MS: Negative for joint pain, low back pain.  Derm: Negative for rash or itching.  Neuro: Negative for weakness, abnormal sensation, seizure, frequent  headaches, +memory loss,  confusion.  Psych: Negative for anxiety, depression, suicidal ideation, hallucinations.  Endo: Negative for unusual weight change.  Heme: Negative for bruising  or bleeding. Allergy: Negative for rash or hives.  Physical Exam   BP (!) 173/91 (BP Location: Left Arm, Patient Position: Sitting, Cuff Size: Normal)   Pulse 60   Temp 98.2 F (36.8 C) (Oral)   Ht 5' (1.524 m)   Wt 103 lb 12.8 oz (47.1 kg)   SpO2 95%   BMI 20.27 kg/m    General: thin fragile appearing female in no acute distress.  Head: Normocephalic, atraumatic.   Eyes: Conjunctiva pink, no icterus. Mouth: Oropharyngeal mucosa moist and pink Neck: Supple without thyromegaly, masses, or lymphadenopathy.  Lungs: Clear to auscultation bilaterally.  Heart: Regular rate and rhythm, no murmurs rubs or gallops.  Abdomen: Bowel sounds are normal, nondistended, no hepatosplenomegaly or masses,  no abdominal bruits or hernia, no rebound or guarding.  Mild epigastric tenderness Rectal: not performed Extremities: No lower extremity edema. No clubbing or deformities.  Neuro: Alert and oriented x 4 , grossly normal neurologically.  Skin: Warm and dry, no rash or jaundice.   Psych: Alert and cooperative, normal mood and affect.  Labs   Lab Results  Component Value Date   WBC 7.3 07/12/2022   HGB 12.3 07/12/2022   HCT 39.7 07/12/2022   MCV 95.7 07/12/2022   PLT 237 07/12/2022   Lab Results  Component Value Date   CREATININE 2.22 (H) 07/12/2022   BUN 49 (H) 07/12/2022   NA 134 (L) 07/12/2022   K 4.8 07/12/2022   CL 106 07/12/2022   CO2 21 (L) 07/12/2022   Lab Results  Component Value Date   ALT 13 07/12/2022   AST 16 07/12/2022   ALKPHOS 77 07/12/2022   BILITOT 0.5 07/12/2022   Lab Results  Component Value Date   LIPASE 34 07/12/2022   Lab Results  Component Value Date   TSH 0.116 (L) 07/12/2022    Imaging Studies   No results found.  Assessment   Nausea/dyspepsia/heartburn:  h/o Nissen fundoplication with last EGD in 2020 as outlined. History of similar symptoms in 2020 and opioid use felt to be contributing factor. Could result in delayed gastric emptying.  Could be secondary to reflux, failed wrap. Denies postprandial abdominal pain making mesenteric ischemia unlikely. Weight is overall stable compared to one year ago.   The patient was found to have elevated blood pressure when vital signs were checked in the office. The blood pressure was rechecked by the nursing staff and it was found be persistently elevated >140/90 mmHg. I personally advised to the patient to follow up closely with his PCP for hypertension control.   PLAN   Stop omeprazole. Start pantoprazole 40mg  BID before meals. Continue zofran prn. Follow up with PCP for urinary symptoms, if she cannot be seen in next 24 hours, she will call for U/A with culture. Return to office in 6 weeks. If no improvement at that time, consider EGD vs UGI.   Leanna Battles. Melvyn Neth, MHS, PA-C [facility]

## 2022-08-14 ENCOUNTER — Ambulatory Visit (INDEPENDENT_AMBULATORY_CARE_PROVIDER_SITE_OTHER): Payer: 59 | Admitting: Gastroenterology

## 2022-08-14 ENCOUNTER — Encounter: Payer: Self-pay | Admitting: Gastroenterology

## 2022-08-14 VITALS — BP 173/91 | HR 60 | Temp 98.2°F | Ht 60.0 in | Wt 103.8 lb

## 2022-08-14 DIAGNOSIS — R110 Nausea: Secondary | ICD-10-CM

## 2022-08-14 MED ORDER — PANTOPRAZOLE SODIUM 40 MG PO TBEC: 40.0000 mg | DELAYED_RELEASE_TABLET | Freq: Two times a day (BID) | ORAL | 0 refills | Status: DC

## 2022-08-14 NOTE — Patient Instructions (Signed)
Stop omeprazole. Start pantoprazole 40mg  before breakfast and 40mg  before evening meal. You can continue zofran every six hours as needed. Follow up with your PCP regarding urinary complaints.  Return office visit in 6 weeks to see if symptoms are better. If no improvement, then we will consider upper endoscopy or barium xray at that time.

## 2022-09-07 ENCOUNTER — Other Ambulatory Visit: Payer: Self-pay | Admitting: Internal Medicine

## 2022-09-07 DIAGNOSIS — G4701 Insomnia due to medical condition: Secondary | ICD-10-CM

## 2022-09-08 ENCOUNTER — Ambulatory Visit: Payer: 59 | Admitting: Internal Medicine

## 2022-09-12 DIAGNOSIS — R350 Frequency of micturition: Secondary | ICD-10-CM | POA: Diagnosis not present

## 2022-09-15 ENCOUNTER — Other Ambulatory Visit: Payer: Self-pay | Admitting: Nurse Practitioner

## 2022-09-15 ENCOUNTER — Ambulatory Visit: Payer: Medicare Other | Admitting: Internal Medicine

## 2022-09-25 ENCOUNTER — Ambulatory Visit (INDEPENDENT_AMBULATORY_CARE_PROVIDER_SITE_OTHER): Payer: 59 | Admitting: Gastroenterology

## 2022-09-25 ENCOUNTER — Encounter: Payer: Self-pay | Admitting: Gastroenterology

## 2022-09-25 VITALS — BP 186/98 | HR 66 | Temp 98.2°F | Ht 60.0 in | Wt 109.8 lb

## 2022-09-25 DIAGNOSIS — R110 Nausea: Secondary | ICD-10-CM

## 2022-09-25 DIAGNOSIS — K219 Gastro-esophageal reflux disease without esophagitis: Secondary | ICD-10-CM | POA: Diagnosis not present

## 2022-09-25 MED ORDER — ESOMEPRAZOLE MAGNESIUM 40 MG PO CPDR: 40.0000 mg | DELAYED_RELEASE_CAPSULE | Freq: Two times a day (BID) | ORAL | 5 refills | Status: DC

## 2022-09-25 NOTE — Patient Instructions (Addendum)
Please take your blood pressure at home. If top number remains over 170, please call your PCP for instructions.  Stop omeprazole and start esomeprazole 40mg  twice daily before breakfast and evening meal.  Continue zofran (odansetron) 4mg  three times daily for nausea. You can take up to 8mg  in 8 hour period of time (maximum dose of 8mg  three times daily). The higher the dose, the more likely you have constipation so take smallest effective dose.  Call in 2-3 weeks if persistent nausea. Would recommend upper endoscopy or barium test at that time. Return office visit in 3 months or sooner if needed.

## 2022-09-25 NOTE — Progress Notes (Signed)
GI Office Note    Referring Provider: Anabel Halon, MD Primary Care Physician:  Anabel Halon, MD  Primary Gastroenterologist: Roetta Sessions, MD   Chief Complaint   Chief Complaint  Patient presents with   Follow-up    States that the pantoprazole did not work so she stopped taking it two weeks ago and started back on omeprazole 40 mg twice daily states that it helps better than the pantoprazole with her reflux. Pt is still having issues with chronic nausea daily.     History of Present Illness   [name] is a 87 y.o. female presenting today for follow up. Last seen in 07/2022. Seen at that time for nausea, unable to vomit due to prior fundoplication. H/o chronic nausea at baseline. She also has h/o GERD, dyspepsia, constipation.  At last ov, her PPI was switched from omeprazole to pantoprazole BID. Zofran continued prn. She tried new PPI for several weeks. She felt like pantoprazole was less effective then omeprazole so she went back to omeprazole. Still having nausea every day. Wishes she could vomit to get relief. Has some heartburn too. No abdominal pain. Some early satiety. BM daily. No melena, brbpr. Zofran helps nausea for a little. Complains of early satiety. No weight loss. Percocet TID.  Blood pressure very high today. States it is like that a lot in the doctor's office but better at home. Was normal at 07/2022 ov with PCP.  She denies headaches. Typical her eyes tell her when her BP is up.    EGD 01/2019: -intact Nissen fundoplication, otherwise normal exam  Medications   Current Outpatient Medications  Medication Sig Dispense Refill   albuterol (VENTOLIN HFA) 108 (90 Base) MCG/ACT inhaler Inhale 1-2 puffs into the lungs every 6 (six) hours as needed for wheezing or shortness of breath. 8 g 5   atorvastatin (LIPITOR) 20 MG tablet TAKE (1) TABLET BY MOUTH ONCE DAILY. 30 tablet 0   carvedilol (COREG) 6.25 MG tablet TAKE (1) TABLET BY MOUTH TWICE DAILY.  (Patient taking differently: Take 6.25 mg by mouth 2 (two) times daily with a meal.) 180 tablet 0   chlorthalidone (HYGROTON) 25 MG tablet Take 12.5 mg by mouth daily.     ipratropium-albuterol (DUONEB) 0.5-2.5 (3) MG/3ML SOLN Take 3 mLs by nebulization every 6 (six) hours as needed. 360 mL 2   levothyroxine (SYNTHROID) 75 MCG tablet Take 1 tablet (75 mcg total) by mouth daily before breakfast. 90 tablet 1   mirtazapine (REMERON) 7.5 MG tablet TAKE (1) TABLET BY MOUTH AT BEDTIME. 30 tablet 0   omeprazole (PRILOSEC) 40 MG capsule Take 40 mg by mouth 2 (two) times daily.     ondansetron (ZOFRAN) 4 MG tablet Take 1 tablet (4 mg total) by mouth every 8 (eight) hours as needed for vomiting or nausea. 90 tablet 0   oxyCODONE-acetaminophen (PERCOCET) 7.5-325 MG tablet Take 1 tablet by mouth 3 (three) times daily as needed.     No current facility-administered medications for this visit.    Allergies   Allergies as of 09/25/2022 - Review Complete 09/25/2022  Allergen Reaction Noted   Hydralazine Other (See Comments) 07/17/2019   Nalbuphine Rash    Paroxetine Itching    Sulfa antibiotics Other (See Comments) 12/13/2011   Venlafaxine Itching       Review of Systems   General: Negative for anorexia, weight loss, fever, chills, fatigue, weakness. ENT: Negative for hoarseness, difficulty swallowing , nasal congestion. CV: Negative for chest  pain, angina, palpitations, dyspnea on exertion, peripheral edema.  Respiratory: Negative for dyspnea at rest, dyspnea on exertion, cough, sputum, wheezing.  GI: See history of present illness. GU:  Negative for dysuria, hematuria, urinary incontinence, urinary frequency, nocturnal urination.  Endo: Negative for unusual weight change.     Physical Exam   BP (!) 186/98 (BP Location: Left Arm, Patient Position: Sitting, Cuff Size: Small)   Pulse 66   Temp 98.2 F (36.8 C) (Oral)   Ht 5' (1.524 m)   Wt 109 lb 12.8 oz (49.8 kg)   SpO2 96%   BMI 21.44  kg/m    General: Well-nourished, well-developed in no acute distress.  Eyes: No icterus. Mouth: Oropharyngeal mucosa moist and pink   Abdomen: Bowel sounds are normal, nontender, nondistended, no hepatosplenomegaly or masses,  no abdominal bruits or hernia , no rebound or guarding.  Rectal: not performed  Extremities: No lower extremity edema. No clubbing or deformities. Neuro: Alert and oriented x 4   Skin: Warm and dry, no jaundice.   Psych: Alert and cooperative, normal mood and affect.  Labs   Lab Results  Component Value Date   CREATININE 2.22 (H) 07/12/2022   BUN 49 (H) 07/12/2022   NA 134 (L) 07/12/2022   K 4.8 07/12/2022   CL 106 07/12/2022   CO2 21 (L) 07/12/2022   Lab Results  Component Value Date   TSH 0.116 (L) 07/12/2022   Lab Results  Component Value Date   WBC 7.3 07/12/2022   HGB 12.3 07/12/2022   HCT 39.7 07/12/2022   MCV 95.7 07/12/2022   PLT 237 07/12/2022   Lab Results  Component Value Date   ALT 13 07/12/2022   AST 16 07/12/2022   ALKPHOS 77 07/12/2022   BILITOT 0.5 07/12/2022    Imaging Studies   No results found.  Assessment   GERD/nausea: last EGD in November 2020 showing intact Nissen fundoplication..  Nausea related to poorly controlled reflux versus dyspepsia versus chronic opioid use/gastroparesis.  Cannot exclude gastritis, peptic ulcer disease.  Patient remains reluctant for EGD or upper GI series.   The patient was found to have elevated blood pressure when vital signs were checked in the office. The blood pressure was rechecked by the nursing staff and it was found be persistently elevated >140/90 mmHg. I personally advised to the patient to follow up closely with his PCP for hypertension control.   PLAN   Offered patient upper endoscopy or upper GI series and at this time she wants to try different medication.  Switch her from omeprazole to esomeprazole 40 mg twice daily before meals.  Patient feels like acid omeprazole has  helped her in the past. Can continue Zofran and up to 8 mg 3 times daily as needed.  Cautioned regarding side effects of constipation. She will check her blood pressure at home.  If systolic remains over 170, she should call her primary care today. Call in 2 to 3 weeks of persistent nausea, at that time would highly recommend upper endoscopy versus barium test. Currently office in 3 months or sooner if needed.  Leanna Battles. Melvyn Neth, MHS, PA-C [facility]

## 2022-10-09 ENCOUNTER — Other Ambulatory Visit: Payer: Self-pay | Admitting: Internal Medicine

## 2022-10-09 DIAGNOSIS — G4701 Insomnia due to medical condition: Secondary | ICD-10-CM

## 2022-10-11 ENCOUNTER — Other Ambulatory Visit: Payer: Self-pay

## 2022-10-16 ENCOUNTER — Other Ambulatory Visit: Payer: Self-pay | Admitting: Internal Medicine

## 2022-10-16 ENCOUNTER — Other Ambulatory Visit: Payer: Self-pay | Admitting: Nurse Practitioner

## 2022-10-19 ENCOUNTER — Telehealth: Payer: Self-pay

## 2022-10-19 NOTE — Telephone Encounter (Signed)
Pt called and is still having nausea and states that she was doing better with the abdominal pain until 3 days ago. States that her stomach has been killing her for the past 3 days. Pt has been taking the esomeprazole twice daily. Pt is still having persistent nausea and states that she is willing to do the scan that you recommended during her ov.

## 2022-10-19 NOTE — Telephone Encounter (Signed)
[name], I offered her an upper endoscopy OR a barium xray of upper gi tract at last ov due to her nausea and GERD. She denied abdominal pain at that time according to my documentation.   Get more information about this pain. When did it start? Is this something new? Where is the pain located? If she is having significant pain, she should consider ED evaluation.   Depending on location of her pain, EGD or barium xray of UGI tract still may be recommended. If pain not upper abdomen, then CT may be more appropriate.

## 2022-10-23 NOTE — Telephone Encounter (Signed)
Left message for return call.

## 2022-10-24 ENCOUNTER — Other Ambulatory Visit: Payer: Self-pay | Admitting: *Deleted

## 2022-10-24 ENCOUNTER — Other Ambulatory Visit: Payer: Self-pay

## 2022-10-24 DIAGNOSIS — R110 Nausea: Secondary | ICD-10-CM | POA: Diagnosis not present

## 2022-10-24 DIAGNOSIS — R109 Unspecified abdominal pain: Secondary | ICD-10-CM

## 2022-10-24 NOTE — Telephone Encounter (Signed)
Recommend labs and CT. Continue Zofran can take every 4-6 hours.   Labs today or tomorrow: CBC, CMET, Lipase.   CT this week: CT A/P with ORAL contrast only Dx: right sided abdominal pain, nausea

## 2022-10-24 NOTE — Addendum Note (Signed)
Addended by: Elinor Dodge on: 10/24/2022 02:49 PM   Modules accepted: Orders

## 2022-10-24 NOTE — Telephone Encounter (Signed)
Pt has been made aware and verbalized understanding. Labs have been ordered and pt has been instructed to have done. Routing to mindy/Serafino Burciaga to arrange CT

## 2022-10-24 NOTE — Telephone Encounter (Signed)
Pt's daughter in law Dewayne Hatch (on [place]) informed that CT is scheduled for 10/25/22, arrive at 8 am to start drinking the contrast and pt stated she was barely awake at 8 am in the morning. Gave number to CS to call and reschedule CT.

## 2022-10-24 NOTE — Telephone Encounter (Signed)
Pt states that she told you that she was having abd pain at her office visit that she has always had abd pain. Pt states that it eased up a little bit after her appt but that it seems to be worse now. . Pt is also still having constant nausea. Pt states that the pain is under her right rib and down near her belly button.

## 2022-10-25 ENCOUNTER — Ambulatory Visit (HOSPITAL_COMMUNITY)
Admission: RE | Admit: 2022-10-25 | Discharge: 2022-10-25 | Disposition: A | Discharge status: Home / Self Care | Payer: 59 | Source: Ambulatory Visit | Attending: Gastroenterology | Admitting: Gastroenterology

## 2022-10-25 DIAGNOSIS — R110 Nausea: Secondary | ICD-10-CM

## 2022-10-25 DIAGNOSIS — R109 Unspecified abdominal pain: Secondary | ICD-10-CM

## 2022-10-26 ENCOUNTER — Other Ambulatory Visit: Payer: Self-pay | Admitting: *Deleted

## 2022-10-26 NOTE — Progress Notes (Signed)
error 

## 2022-10-30 ENCOUNTER — Other Ambulatory Visit: Payer: Self-pay | Admitting: Gastroenterology

## 2022-10-30 DIAGNOSIS — I129 Hypertensive chronic kidney disease with stage 1 through stage 4 chronic kidney disease, or unspecified chronic kidney disease: Secondary | ICD-10-CM | POA: Diagnosis not present

## 2022-10-30 DIAGNOSIS — D631 Anemia in chronic kidney disease: Secondary | ICD-10-CM | POA: Diagnosis not present

## 2022-10-30 DIAGNOSIS — R935 Abnormal findings on diagnostic imaging of other abdominal regions, including retroperitoneum: Secondary | ICD-10-CM

## 2022-10-30 DIAGNOSIS — N2581 Secondary hyperparathyroidism of renal origin: Secondary | ICD-10-CM | POA: Diagnosis not present

## 2022-10-30 DIAGNOSIS — N184 Chronic kidney disease, stage 4 (severe): Secondary | ICD-10-CM | POA: Diagnosis not present

## 2022-10-30 DIAGNOSIS — R809 Proteinuria, unspecified: Secondary | ICD-10-CM | POA: Diagnosis not present

## 2022-10-31 LAB — LAB REPORT - SCANNED
Albumin, Urine POC: 395.6
Creatinine, POC: 60.9 mg/dL
EGFR: 19
Microalb Creat Ratio: 650

## 2022-11-02 ENCOUNTER — Other Ambulatory Visit: Payer: Self-pay | Admitting: Nurse Practitioner

## 2022-11-07 ENCOUNTER — Other Ambulatory Visit: Payer: Self-pay | Admitting: Nurse Practitioner

## 2022-11-07 ENCOUNTER — Other Ambulatory Visit: Payer: Self-pay | Admitting: Internal Medicine

## 2022-11-07 DIAGNOSIS — G4701 Insomnia due to medical condition: Secondary | ICD-10-CM

## 2022-11-07 DIAGNOSIS — J439 Emphysema, unspecified: Secondary | ICD-10-CM

## 2022-11-08 ENCOUNTER — Encounter: Payer: Self-pay | Admitting: *Deleted

## 2022-11-08 ENCOUNTER — Ambulatory Visit (HOSPITAL_COMMUNITY)
Admission: RE | Admit: 2022-11-08 | Discharge: 2022-11-08 | Disposition: A | Discharge status: Home / Self Care | Payer: 59 | Source: Ambulatory Visit | Attending: Gastroenterology | Admitting: Gastroenterology

## 2022-11-08 DIAGNOSIS — R935 Abnormal findings on diagnostic imaging of other abdominal regions, including retroperitoneum: Secondary | ICD-10-CM | POA: Insufficient documentation

## 2022-11-09 ENCOUNTER — Telehealth: Payer: Self-pay | Admitting: Internal Medicine

## 2022-11-09 ENCOUNTER — Other Ambulatory Visit: Payer: Self-pay | Admitting: Nurse Practitioner

## 2022-11-09 NOTE — Telephone Encounter (Signed)
Refills sent by different office today

## 2022-11-09 NOTE — Telephone Encounter (Signed)
Pt's son Jeannett Senior (on [place]) given pre-op on 12/04/22 at 11:30 am at [facility] for her procedure on 12/06/22.

## 2022-11-09 NOTE — Telephone Encounter (Signed)
Prescription Request  11/09/2022  LOV: 07/27/2022  What is the name of the medication or equipment? atorvastatin (LIPITOR) 20 MG tablet [[id number]     Have you contacted your pharmacy to request a refill? Yes   Which pharmacy would you like this sent to?  [facility] - [place] - [address] [place] [address] Phone: [phone] Fax: [phone]    Patient notified that their request is being sent to the clinical staff for review and that they should receive a response within 2 business days.   Please advise at [facility] [phone]

## 2022-11-13 ENCOUNTER — Other Ambulatory Visit: Payer: Self-pay | Admitting: Nurse Practitioner

## 2022-11-13 ENCOUNTER — Other Ambulatory Visit: Payer: Self-pay | Admitting: Internal Medicine

## 2022-11-14 ENCOUNTER — Other Ambulatory Visit: Payer: Self-pay | Admitting: Internal Medicine

## 2022-12-04 ENCOUNTER — Encounter (HOSPITAL_COMMUNITY)
Admission: RE | Admit: 2022-12-04 | Discharge: 2022-12-04 | Disposition: A | Discharge status: Home / Self Care | Payer: 59 | Source: Ambulatory Visit | Attending: Internal Medicine | Admitting: Internal Medicine

## 2022-12-04 ENCOUNTER — Other Ambulatory Visit: Payer: Self-pay | Admitting: Internal Medicine

## 2022-12-04 DIAGNOSIS — G4701 Insomnia due to medical condition: Secondary | ICD-10-CM

## 2022-12-05 ENCOUNTER — Ambulatory Visit: Payer: 59 | Attending: Internal Medicine | Admitting: Internal Medicine

## 2022-12-05 ENCOUNTER — Encounter: Payer: Self-pay | Admitting: Internal Medicine

## 2022-12-05 VITALS — BP 158/82 | HR 56 | Ht 60.0 in | Wt 113.0 lb

## 2022-12-05 DIAGNOSIS — I340 Nonrheumatic mitral (valve) insufficiency: Secondary | ICD-10-CM | POA: Diagnosis not present

## 2022-12-05 DIAGNOSIS — I2510 Atherosclerotic heart disease of native coronary artery without angina pectoris: Secondary | ICD-10-CM | POA: Diagnosis not present

## 2022-12-05 DIAGNOSIS — N183 Chronic kidney disease, stage 3 unspecified: Secondary | ICD-10-CM | POA: Insufficient documentation

## 2022-12-05 DIAGNOSIS — Z79899 Other long term (current) drug therapy: Secondary | ICD-10-CM

## 2022-12-05 DIAGNOSIS — N1832 Chronic kidney disease, stage 3b: Secondary | ICD-10-CM

## 2022-12-05 MED ORDER — FUROSEMIDE 20 MG PO TABS
20.0000 mg | ORAL_TABLET | Freq: Every day | ORAL | 3 refills | Status: AC
Start: 2022-12-05 — End: 2024-05-29

## 2022-12-05 NOTE — Patient Instructions (Addendum)
Medication Instructions:  Your physician has recommended you make the following change in your medication: Stop taking Chlorthalidone  Start taking Lasix 20 mg once daily and as needed for shortness of breath and leg swelling Continue taking all other medications as prescribed  Labwork: BMET in a week at [facility]  Testing/Procedures: None  Follow-Up: Your physician recommends that you schedule a follow-up appointment in: 1 year. You will receive a reminder call in about 8 months reminding you to schedule your appointment. If you don't receive this call, please contact our office.   Any Other Special Instructions Will Be Listed Below (If Applicable).  If you need a refill on your cardiac medications before your next appointment, please call your pharmacy.

## 2022-12-05 NOTE — Progress Notes (Addendum)
Cardiology Office Note  Date: 12/05/2022   ID: [name], [name] 01/08/1936, MRN [id number]  PCP:  Anabel Halon, MD  Cardiologist:  Marjo Bicker, MD Electrophysiologist:  None   History of Present Illness: [name] is a 87 y.o. female known to have nonobstructive CAD, valvular heart disease (mild MR and mild TR in 2017), HTN, CKD stage IIIb, palpitations is here for follow-up visit.  Accompanied by daughter-in-law.  Walks with walker, has mild SOB but not overt, no orthopnea, PND or leg swelling.  No angina, presyncope or syncope.  Has some dizziness while walking with a walker but patient is deconditioned, does not do much at home.  Does not drink adequate water, drinks 2 bottles of 8 ounces each.  Occasional palpitations.  Past Medical History:  Diagnosis Date   Anxiety disorder, unspecified    Arteriosclerotic cardiovascular disease (ASCVD) 2006   Nonobstructive; < 50% lesions on cath 2002; negative stress nuclear study in 10/2004   Asthma    Chest pain, unspecified    Chronic kidney disease, unspecified    Chronic obstructive pulmonary disease, unspecified (HCC)    Chronic pelvic pain in female    Colitis due to Clostridium difficile 1994   1994   COPD (chronic obstructive pulmonary disease) (HCC)    Depression with anxiety    Diarrhea, unspecified    Disorder of thyroid, unspecified    Gastro-esophageal reflux disease without esophagitis    Gastroesophageal reflux disease    Hyperlipidemia    Hyperlipidemia, unspecified    Hypertension    Hypertensive chronic kidney disease with stage 1 through stage 4 chronic kidney disease, or unspecified chronic kidney disease    Hyperthyroidism    Hypothyroidism    Hypothyroidism, unspecified    Kidney disease, chronic, stage III (moderate, EGFR 30-59 ml/min) (HCC)    Lower abdominal pain, unspecified    Major depressive disorder, single episode, severe without psychotic features (HCC)    Major depressive  disorder, single episode, unspecified    Nausea    Other postherpetic nervous system involvement    Pelvic and perineal pain    Rash and other nonspecific skin eruption    S/P colonoscopy 2007   few diverticula, otherwise nl   S/P colonoscopy 12/13/10   rectal, cecal polyp, left-sided diverticulosis, hyperplastic. ?anal fissure? treated empirically   S/P endoscopy 2009   linear esophageal erosions   S/P endoscopy 05/10/10   Question island of salmon-colored epithelium in distal esophagus ; no Barrett's.    Shingles    Tobacco abuse, in remission    55-pack-year consumption; discontinued 08/2010   Unspecified asthma, uncomplicated    Unspecified osteoarthritis, unspecified site    Urinary tract infection, site not specified     Past Surgical History:  Procedure Laterality Date   ABDOMINAL HYSTERECTOMY     BREAST LUMPECTOMY     CATARACT EXTRACTION     CHOLECYSTECTOMY  1962   COLONOSCOPY  12/13/2010   Left-sided diverticulosis.  Cecal polyp, status post hot snare polypectomy/ Diminutive rectal polyp, status post cold biopsy removal tender/painful anal canal, ? occult anal fissure- Not visualized   COLONOSCOPY N/A 10/14/2015   Diverticulosis   ESOPHAGOGASTRODUODENOSCOPY  05/10/10   benign mucosa with mild chronic inflammation.   ESOPHAGOGASTRODUODENOSCOPY (EGD) WITH PROPOFOL N/A 02/13/2019   Procedure: ESOPHAGOGASTRODUODENOSCOPY (EGD) WITH PROPOFOL;  Surgeon: Corbin Ade, MD;  [facility];  Service: Endoscopy;  Laterality: N/A;  3:00pm   FLEXIBLE SIGMOIDOSCOPY  12/29/2011  Procedure: FLEXIBLE SIGMOIDOSCOPY;  Surgeon: Malissa Hippo, MD;  Location: [facility];  Service: Endoscopy;  Laterality: N/A;  200-Ann notified pt to be here @ 1:00   NISSEN FUNDOPLICATION     YAG LASER APPLICATION Left 05/25/2014   Procedure: YAG LASER APPLICATION;  Surgeon: Susa Simmonds, MD;  Location: [facility];  Service: Ophthalmology;  Laterality: Left;    Current Outpatient Medications   Medication Sig Dispense Refill   albuterol (VENTOLIN HFA) 108 (90 Base) MCG/ACT inhaler Inhale 1-2 puffs into the lungs every 6 (six) hours as needed for wheezing or shortness of breath. 8 g 5   amLODipine (NORVASC) 5 MG tablet Take 5 mg by mouth daily.     atorvastatin (LIPITOR) 20 MG tablet TAKE ONE TABLET BY MOUTH ONCE DAILY. 90 tablet 0   BREZTRI AEROSPHERE 160-9-4.8 MCG/ACT AERO INHALE 2 PUFFS TWICE DAILY. 10.7 g 0   carvedilol (COREG) 6.25 MG tablet TAKE (1) TABLET BY MOUTH TWICE DAILY. (Patient taking differently: Take 6.25 mg by mouth 2 (two) times daily with a meal.) 180 tablet 0   chlorthalidone (HYGROTON) 25 MG tablet Take 12.5 mg by mouth daily.     esomeprazole (NEXIUM) 40 MG capsule Take 1 capsule (40 mg total) by mouth 2 (two) times daily before a meal. 60 capsule 5   ipratropium-albuterol (DUONEB) 0.5-2.5 (3) MG/3ML SOLN Take 3 mLs by nebulization every 6 (six) hours as needed. 360 mL 2   levothyroxine (SYNTHROID) 75 MCG tablet Take 1 tablet (75 mcg total) by mouth daily before breakfast. 90 tablet 1   mirtazapine (REMERON) 7.5 MG tablet TAKE (1) TABLET BY MOUTH AT BEDTIME. 30 tablet 0   ondansetron (ZOFRAN) 4 MG tablet TAKE 1 TABLET BY MOUTH THREE TIMES AS NEEDED FOR NAUSEA. 90 tablet 0   oxyCODONE-acetaminophen (PERCOCET) 7.5-325 MG tablet Take 1 tablet by mouth 3 (three) times daily as needed.     No current facility-administered medications for this visit.   Allergies:  Hydralazine, Nalbuphine, Paroxetine, Sulfa antibiotics, and Venlafaxine   Social History: The patient  reports that she quit smoking about 12 years ago. Her smoking use included cigarettes. She started smoking about 67 years ago. She has a 55 pack-year smoking history. She has never used smokeless tobacco. She reports that she does not drink alcohol and does not use drugs.   Family History: The patient's family history includes Alzheimer's disease in her mother; Aneurysm in her son; Cancer in her mother,  sister, and son; Diabetes in her mother and son; Heart disease in her brother and father; Hypertension in her brother.   ROS:  Please see the history of present illness. Otherwise, complete review of systems is positive for none  All other systems are reviewed and negative.   Physical Exam: VS:  BP (!) 158/82   Pulse (!) 56   Ht 5' (1.524 m)   Wt 113 lb (51.3 kg)   SpO2 98%   BMI 22.07 kg/m , BMI Body mass index is 22.07 kg/m.  Wt Readings from Last 3 Encounters:  12/05/22 113 lb (51.3 kg)  12/04/22 109 lb 12.6 oz (49.8 kg)  09/25/22 109 lb 12.8 oz (49.8 kg)    General: Patient appears comfortable at rest. HEENT: Conjunctiva and lids normal, oropharynx clear with moist mucosa. Neck: Supple, no elevated JVP or carotid bruits, no thyromegaly. Lungs: Clear to auscultation, nonlabored breathing at rest. Cardiac: Regular rate and rhythm, no S3 or significant systolic murmur, no pericardial rub. Abdomen: Soft, nontender, no  hepatomegaly, bowel sounds present, no guarding or rebound. Extremities: No pitting edema, distal pulses 2+. Skin: Warm and dry. Musculoskeletal: No kyphosis. Neuropsychiatric: Alert and oriented x3, affect grossly appropriate.  Recent Labwork: 07/12/2022: B Natriuretic Peptide 116.0; TSH 0.116 10/24/2022: ALT 16; AST 17; BUN 39; Creatinine, Ser 2.41; Hemoglobin 12.1; Platelets 232; Potassium 4.6; Sodium 139     Component Value Date/Time   CHOL 164 09/17/2018 0000   TRIG 145 09/17/2018 0000   HDL 44 09/17/2018 0000   CHOLHDL 2.9 07/15/2008 0528   VLDL 23 07/15/2008 0528   LDLCALC 95 09/17/2018 0000     Assessment and Plan:  HTN, partially controlled Nonobstructive CAD Valvular heart disease (Mild MR and mild TR in 2017) CKD stage IIIb  -Asymptomatic and compensated.  Will switch chlorthalidone to p.o. Lasix as needed SOB/LE swelling due to CKD stage IIIb and no overt symptoms of DOE (although has mild SOB, but I think this could be from deconditioning).   BMP in 5 days.  Echo in 2017 showed normal LVEF, mild MR and mild TR).  Patient would like to defer echocardiogram which I think is reasonable. -Continue current antihypertensives, amlodipine 5 mg once daily, carvedilol 6.25 mg twice daily and the chlorthalidone is being switched to p.o. Lasix.  Goal BP less than 140 to 150 mmHg SBP.  Room to go up on amlodipine.  Management of HTN per PCP and nephrology.       Medication Adjustments/Labs and Tests Ordered: Current medicines are reviewed at length with the patient today.  Concerns regarding medicines are outlined above.    Disposition:  Follow up  1 year  Signed Coryn Mosso Verne Spurr, MD, 12/05/2022 10:02 AM    [facility] [address] [place], [place], [place] [address]

## 2022-12-06 ENCOUNTER — Ambulatory Visit (HOSPITAL_COMMUNITY)
Admission: RE | Admit: 2022-12-06 | Discharge: 2022-12-06 | Disposition: A | Discharge status: Home / Self Care | Payer: 59 | Attending: Internal Medicine | Admitting: Internal Medicine

## 2022-12-06 ENCOUNTER — Ambulatory Visit (HOSPITAL_COMMUNITY): Payer: 59 | Admitting: Certified Registered"

## 2022-12-06 ENCOUNTER — Encounter (HOSPITAL_COMMUNITY): Payer: Self-pay | Admitting: Internal Medicine

## 2022-12-06 ENCOUNTER — Encounter (HOSPITAL_COMMUNITY)
Admission: RE | Disposition: A | Discharge status: Home / Self Care | Payer: Self-pay | Source: Home / Self Care | Attending: Internal Medicine

## 2022-12-06 DIAGNOSIS — R933 Abnormal findings on diagnostic imaging of other parts of digestive tract: Secondary | ICD-10-CM | POA: Diagnosis not present

## 2022-12-06 DIAGNOSIS — I129 Hypertensive chronic kidney disease with stage 1 through stage 4 chronic kidney disease, or unspecified chronic kidney disease: Secondary | ICD-10-CM | POA: Diagnosis not present

## 2022-12-06 DIAGNOSIS — Z87891 Personal history of nicotine dependence: Secondary | ICD-10-CM | POA: Diagnosis not present

## 2022-12-06 DIAGNOSIS — K219 Gastro-esophageal reflux disease without esophagitis: Secondary | ICD-10-CM | POA: Insufficient documentation

## 2022-12-06 DIAGNOSIS — R1084 Generalized abdominal pain: Secondary | ICD-10-CM

## 2022-12-06 DIAGNOSIS — N184 Chronic kidney disease, stage 4 (severe): Secondary | ICD-10-CM | POA: Diagnosis not present

## 2022-12-06 DIAGNOSIS — Z9889 Other specified postprocedural states: Secondary | ICD-10-CM | POA: Diagnosis not present

## 2022-12-06 DIAGNOSIS — Z86010 Personal history of colonic polyps: Secondary | ICD-10-CM | POA: Diagnosis not present

## 2022-12-06 DIAGNOSIS — J4489 Other specified chronic obstructive pulmonary disease: Secondary | ICD-10-CM | POA: Insufficient documentation

## 2022-12-06 DIAGNOSIS — Z8249 Family history of ischemic heart disease and other diseases of the circulatory system: Secondary | ICD-10-CM | POA: Diagnosis not present

## 2022-12-06 DIAGNOSIS — Z8719 Personal history of other diseases of the digestive system: Secondary | ICD-10-CM | POA: Insufficient documentation

## 2022-12-06 DIAGNOSIS — I130 Hypertensive heart and chronic kidney disease with heart failure and stage 1 through stage 4 chronic kidney disease, or unspecified chronic kidney disease: Secondary | ICD-10-CM | POA: Diagnosis not present

## 2022-12-06 DIAGNOSIS — N189 Chronic kidney disease, unspecified: Secondary | ICD-10-CM | POA: Diagnosis not present

## 2022-12-06 DIAGNOSIS — J449 Chronic obstructive pulmonary disease, unspecified: Secondary | ICD-10-CM | POA: Diagnosis not present

## 2022-12-06 DIAGNOSIS — R110 Nausea: Secondary | ICD-10-CM | POA: Diagnosis not present

## 2022-12-06 DIAGNOSIS — I5030 Unspecified diastolic (congestive) heart failure: Secondary | ICD-10-CM | POA: Diagnosis not present

## 2022-12-06 DIAGNOSIS — K2950 Unspecified chronic gastritis without bleeding: Secondary | ICD-10-CM | POA: Diagnosis not present

## 2022-12-06 DIAGNOSIS — K3189 Other diseases of stomach and duodenum: Secondary | ICD-10-CM

## 2022-12-06 DIAGNOSIS — J45909 Unspecified asthma, uncomplicated: Secondary | ICD-10-CM | POA: Diagnosis not present

## 2022-12-06 HISTORY — PX: BIOPSY: SHX5522

## 2022-12-06 HISTORY — PX: ESOPHAGOGASTRODUODENOSCOPY (EGD) WITH PROPOFOL: SHX5813

## 2022-12-06 SURGERY — ESOPHAGOGASTRODUODENOSCOPY (EGD) WITH PROPOFOL
Anesthesia: General

## 2022-12-06 MED ORDER — LACTATED RINGERS IV SOLN: INTRAVENOUS | Status: DC | PRN

## 2022-12-06 MED ORDER — LIDOCAINE HCL (CARDIAC) PF 100 MG/5ML IV SOSY
PREFILLED_SYRINGE | INTRAVENOUS | Status: DC | PRN
  Administered 2022-12-06: 60 mg via INTRAVENOUS

## 2022-12-06 MED ORDER — EPHEDRINE SULFATE-NACL 50-0.9 MG/10ML-% IV SOSY
PREFILLED_SYRINGE | INTRAVENOUS | Status: DC | PRN
Start: 2022-12-06 — End: 2022-12-06
  Administered 2022-12-06: 10 mg via INTRAVENOUS

## 2022-12-06 MED ORDER — PROPOFOL 500 MG/50ML IV EMUL
INTRAVENOUS | Status: DC | PRN
  Administered 2022-12-06: 100 ug/kg/min via INTRAVENOUS

## 2022-12-06 MED ORDER — STERILE WATER FOR IRRIGATION IR SOLN
Status: DC | PRN
  Administered 2022-12-06: 60 mL

## 2022-12-06 MED ORDER — LIDOCAINE HCL (PF) 2 % IJ SOLN
INTRAMUSCULAR | Status: AC
  Filled 2022-12-06: qty 5

## 2022-12-06 MED ORDER — LACTATED RINGERS IV SOLN: INTRAVENOUS | Status: DC

## 2022-12-06 MED ORDER — PROPOFOL 10 MG/ML IV BOLUS
INTRAVENOUS | Status: DC | PRN
Start: 2022-12-06 — End: 2022-12-06
  Administered 2022-12-06: 80 mg via INTRAVENOUS

## 2022-12-06 NOTE — Anesthesia Preprocedure Evaluation (Signed)
Anesthesia Evaluation  Patient identified by MRN, date of birth, ID band Patient awake    Reviewed: Allergy & Precautions, H&P , NPO status , Patient's Chart, lab work & pertinent test results, reviewed documented beta blocker date and time   Airway Mallampati: II  TM Distance: >3 FB Neck ROM: full    Dental no notable dental hx.    Pulmonary neg pulmonary ROS, asthma , COPD, former smoker   Pulmonary exam normal breath sounds clear to auscultation       Cardiovascular Exercise Tolerance: Good hypertension, negative cardio ROS  Rhythm:regular Rate:Normal     Neuro/Psych  PSYCHIATRIC DISORDERS Anxiety Depression   Dementia negative neurological ROS  negative psych ROS   GI/Hepatic negative GI ROS, Neg liver ROS,GERD  ,,  Endo/Other  negative endocrine ROSHypothyroidism Hyperthyroidism   Renal/GU Renal diseasenegative Renal ROS  negative genitourinary   Musculoskeletal   Abdominal   Peds  Hematology negative hematology ROS (+) Blood dyscrasia, anemia   Anesthesia Other Findings   Reproductive/Obstetrics negative OB ROS                             Anesthesia Physical Anesthesia Plan  ASA: 3  Anesthesia Plan: General   Post-op Pain Management:    Induction:   PONV Risk Score and Plan: Propofol infusion  Airway Management Planned:   Additional Equipment:   Intra-op Plan:   Post-operative Plan:   Informed Consent: I have reviewed the patients History and Physical, chart, labs and discussed the procedure including the risks, benefits and alternatives for the proposed anesthesia with the patient or authorized representative who has indicated his/her understanding and acceptance.     Dental Advisory Given  Plan Discussed with: CRNA  Anesthesia Plan Comments:        Anesthesia Quick Evaluation

## 2022-12-06 NOTE — Transfer of Care (Addendum)
Immediate Anesthesia Transfer of Care Note  Patient: [name]  Procedure(s) Performed: ESOPHAGOGASTRODUODENOSCOPY (EGD) WITH PROPOFOL BIOPSY  Patient Location: Short Stay  Anesthesia Type:General  Level of Consciousness: awake and patient cooperative  Airway & Oxygen Therapy: Patient Spontanous Breathing  Post-op Assessment: Report given to RN and Post -op Vital signs reviewed and stable  Post vital signs: Reviewed and stable  Last Vitals:  Vitals Value Taken Time  BP 129/63 12/06/22   1028  Temp 36.2 12/06/22   1028  Pulse 58 12/06/22   1028  Resp 17 12/06/22   1028  SpO2 100% 12/06/22   1028    Last Pain:  Vitals:   12/06/22 1006  TempSrc:   PainSc: 0-No pain         Complications: No notable events documented.

## 2022-12-06 NOTE — H&P (Signed)
@LOGO @   Primary Care Physician:  Anabel Halon, MD Primary Gastroenterologist:  Dr. Jena Gauss  Pre-Procedure History & Physical: HPI:  [name] is a 87 y.o. female here for  further evaluation of nausea right side abdominal pain.  Recent CT soft tissue projection distal esophagus chronic finding adnexal cyst follow-up with pelvic ultrasound recommended repeat exam in 6 months.  She was referred to GYN.  History of Nissen fundoplication.  Past Medical History:  Diagnosis Date   Anxiety disorder, unspecified    Arteriosclerotic cardiovascular disease (ASCVD) 2006   Nonobstructive; < 50% lesions on cath 2002; negative stress nuclear study in 10/2004   Asthma    Chest pain, unspecified    Chronic kidney disease, unspecified    Chronic obstructive pulmonary disease, unspecified (HCC)    Chronic pelvic pain in female    Colitis due to Clostridium difficile 1994   1994   COPD (chronic obstructive pulmonary disease) (HCC)    Depression with anxiety    Diarrhea, unspecified    Disorder of thyroid, unspecified    Gastro-esophageal reflux disease without esophagitis    Gastroesophageal reflux disease    Hyperlipidemia    Hyperlipidemia, unspecified    Hypertension    Hypertensive chronic kidney disease with stage 1 through stage 4 chronic kidney disease, or unspecified chronic kidney disease    Hyperthyroidism    Hypothyroidism    Hypothyroidism, unspecified    Kidney disease, chronic, stage III (moderate, EGFR 30-59 ml/min) (HCC)    Lower abdominal pain, unspecified    Major depressive disorder, single episode, severe without psychotic features (HCC)    Major depressive disorder, single episode, unspecified    Nausea    Other postherpetic nervous system involvement    Pelvic and perineal pain    Rash and other nonspecific skin eruption    S/P colonoscopy 2007   few diverticula, otherwise nl   S/P colonoscopy 12/13/10   rectal, cecal polyp, left-sided diverticulosis,  hyperplastic. ?anal fissure? treated empirically   S/P endoscopy 2009   linear esophageal erosions   S/P endoscopy 05/10/10   Question island of salmon-colored epithelium in distal esophagus ; no Barrett's.    Shingles    Tobacco abuse, in remission    55-pack-year consumption; discontinued 08/2010   Unspecified asthma, uncomplicated    Unspecified osteoarthritis, unspecified site    Urinary tract infection, site not specified     Past Surgical History:  Procedure Laterality Date   ABDOMINAL HYSTERECTOMY     BREAST LUMPECTOMY     CATARACT EXTRACTION     CHOLECYSTECTOMY  1962   COLONOSCOPY  12/13/2010   Left-sided diverticulosis.  Cecal polyp, status post hot snare polypectomy/ Diminutive rectal polyp, status post cold biopsy removal tender/painful anal canal, ? occult anal fissure- Not visualized   COLONOSCOPY N/A 10/14/2015   Diverticulosis   ESOPHAGOGASTRODUODENOSCOPY  05/10/10   benign mucosa with mild chronic inflammation.   ESOPHAGOGASTRODUODENOSCOPY (EGD) WITH PROPOFOL N/A 02/13/2019   Procedure: ESOPHAGOGASTRODUODENOSCOPY (EGD) WITH PROPOFOL;  Surgeon: Corbin Ade, MD;  [facility];  Service: Endoscopy;  Laterality: N/A;  3:00pm   FLEXIBLE SIGMOIDOSCOPY  12/29/2011   Procedure: FLEXIBLE SIGMOIDOSCOPY;  Surgeon: Malissa Hippo, MD;  Location: [facility];  Service: Endoscopy;  Laterality: N/A;  200-Ann notified pt to be here @ 1:00   NISSEN FUNDOPLICATION     YAG LASER APPLICATION Left 05/25/2014   Procedure: YAG LASER APPLICATION;  Surgeon: Susa Simmonds, MD;  Location: [facility];  Service:  Ophthalmology;  Laterality: Left;    Prior to Admission medications   Medication Sig Start Date End Date Taking? Authorizing Provider  albuterol (VENTOLIN HFA) 108 (90 Base) MCG/ACT inhaler Inhale 1-2 puffs into the lungs every 6 (six) hours as needed for wheezing or shortness of breath. 12/28/20  Yes Anabel Halon, MD  amLODipine (NORVASC) 5 MG tablet Take 5 mg by mouth  daily. 11/24/22  Yes [provider]  atorvastatin (LIPITOR) 20 MG tablet TAKE ONE TABLET BY MOUTH ONCE DAILY. 11/14/22  Yes Mallipeddi, Vishnu P, MD  BREZTRI AEROSPHERE 160-9-4.8 MCG/ACT AERO INHALE 2 PUFFS TWICE DAILY. 11/07/22  Yes Anabel Halon, MD  esomeprazole (NEXIUM) 40 MG capsule Take 1 capsule (40 mg total) by mouth 2 (two) times daily before a meal. 09/25/22  Yes Tiffany Kocher, PA-C  furosemide (LASIX) 20 MG tablet Take 1 tablet (20 mg total) by mouth daily. 12/05/22 03/05/23 Yes Mallipeddi, Vishnu P, MD  levothyroxine (SYNTHROID) 75 MCG tablet Take 1 tablet (75 mcg total) by mouth daily before breakfast. 07/27/22  Yes Patel, Earlie Lou, MD  mirtazapine (REMERON) 7.5 MG tablet TAKE (1) TABLET BY MOUTH AT BEDTIME. 12/04/22  Yes Patel, Earlie Lou, MD  ondansetron (ZOFRAN) 4 MG tablet TAKE 1 TABLET BY MOUTH THREE TIMES AS NEEDED FOR NAUSEA. 10/16/22  Yes Anabel Halon, MD  oxyCODONE-acetaminophen (PERCOCET) 7.5-325 MG tablet Take 1 tablet by mouth 3 (three) times daily as needed. 06/29/22  Yes [provider]  carvedilol (COREG) 6.25 MG tablet TAKE (1) TABLET BY MOUTH TWICE DAILY. Patient taking differently: Take 6.25 mg by mouth 2 (two) times daily with a meal. 04/24/22   Patel, Earlie Lou, MD  ipratropium-albuterol (DUONEB) 0.5-2.5 (3) MG/3ML SOLN Take 3 mLs by nebulization every 6 (six) hours as needed. 08/11/21   Anabel Halon, MD    Allergies as of 11/08/2022 - Review Complete 09/25/2022  Allergen Reaction Noted   Hydralazine Other (See Comments) 07/17/2019   Nalbuphine Rash    Paroxetine Itching    Sulfa antibiotics Other (See Comments) 12/13/2011   Venlafaxine Itching     Family History  Problem Relation Age of Onset   Diabetes Mother    Alzheimer's disease Mother    Cancer Mother    Heart disease Father    Cancer Sister    Heart disease Brother        Also mother, Father, brother and 2 sons   Cancer Son        testicular cancer    Aneurysm Son    Diabetes Son     Hypertension Brother        also Sister x2   Colon cancer Neg Hx     Social History   Socioeconomic History   Marital status: Married    Spouse name: Not on file   Number of children: 4   Years of education: Not on file   Highest education level: Not on file  Occupational History   Not on file  Tobacco Use   Smoking status: Former    Current packs/day: 0.00    Average packs/day: 1 pack/day for 55.0 years (55.0 ttl pk-yrs)    Types: Cigarettes    Start date: 09/15/1955    Quit date: 09/15/2010    Years since quitting: 12.2   Smokeless tobacco: Never  Vaping Use   Vaping status: Never Used  Substance and Sexual Activity   Alcohol use: No    Alcohol/week: 0.0 standard drinks of alcohol  Drug use: No   Sexual activity: Not Currently  Other Topics Concern   Not on file  Social History Narrative   Not on file   Social Determinants of Health   Financial Resource Strain: Low Risk  (02/07/2022)   Overall Financial Resource Strain (CARDIA)    Difficulty of Paying Living Expenses: Not hard at all  Food Insecurity: No Food Insecurity (02/07/2022)   Hunger Vital Sign    Worried About Running Out of Food in the Last Year: Never true    Ran Out of Food in the Last Year: Never true  Transportation Needs: No Transportation Needs (02/07/2022)   PRAPARE - Administrator, Civil Service (Medical): No    Lack of Transportation (Non-Medical): No  Physical Activity: Inactive (09/06/2020)   Exercise Vital Sign    Days of Exercise per Week: 0 days    Minutes of Exercise per Session: 0 min  Stress: No Stress Concern Present (02/07/2022)   [employer] of Occupational Health - Occupational Stress Questionnaire    Feeling of Stress : Not at all  Social Connections: Socially Isolated (02/07/2022)   Social Connection and Isolation Panel [NHANES]    Frequency of Communication with Friends and Family: More than three times a week    Frequency of Social Gatherings with  Friends and Family: Once a week    Attends Religious Services: Never    Database administrator or Organizations: No    Attends Banker Meetings: Never    Marital Status: Separated  Intimate Partner Violence: Not At Risk (09/06/2020)   Humiliation, Afraid, Rape, and Kick questionnaire    Fear of Current or Ex-Partner: No    Emotionally Abused: No    Physically Abused: No    Sexually Abused: No    Review of Systems: See HPI, otherwise negative ROS  Physical Exam: BP (!) 193/78   Pulse (!) 59   Temp 97.7 F (36.5 C) (Oral)   Resp 16   Ht 5' (1.524 m)   Wt 51.2 kg   SpO2 100%   BMI 22.04 kg/m  General:   Alert,  Well-developed, well-nourished, pleasant and cooperative in NAD Neck:  Supple; no masses or thyromegaly. No significant cervical adenopathy. Lungs:  Clear throughout to auscultation.   No wheezes, crackles, or rhonchi. No acute distress. Heart:  Regular rate and rhythm; no murmurs, clicks, rubs,  or gallops. Abdomen: Non-distended, normal bowel sounds.  Soft and nontender without appreciable mass or hepatosplenomegaly.    Impression/Plan:    Nausea right side abdominal pain status post fundoplication.  CT demonstrated soft tissue impression on distal esophagus chronic going back to 2020.  Patient denies dysphagia.  Adnexal mass -new-followed by GYN.    I have offered the patient   a diagnostic EGD.  The risks, benefits, limitations, alternatives and imponderables have been reviewed with the patient. Potential for esophageal dilation, biopsy, etc. have also been reviewed.  Questions have been answered. All parties agreeable.      Notice: This dictation was prepared with Dragon dictation along with smaller phrase technology. Any transcriptional errors that result from this process are unintentional and may not be corrected upon review.

## 2022-12-06 NOTE — Discharge Instructions (Signed)
EGD Discharge instructions Please read the instructions outlined below and refer to this sheet in the next few weeks. These discharge instructions provide you with general information on caring for yourself after you leave the hospital. Your doctor may also give you specific instructions. While your treatment has been planned according to the most current medical practices available, unavoidable complications occasionally occur. If you have any problems or questions after discharge, please call your doctor. ACTIVITY You may resume your regular activity but move at a slower pace for the next 24 hours.  Take frequent rest periods for the next 24 hours.  Walking will help expel (get rid of) the air and reduce the bloated feeling in your abdomen.  No driving for 24 hours (because of the anesthesia (medicine) used during the test).  You may shower.  Do not sign any important legal documents or operate any machinery for 24 hours (because of the anesthesia used during the test).  NUTRITION Drink plenty of fluids.  You may resume your normal diet.  Begin with a light meal and progress to your normal diet.  Avoid alcoholic beverages for 24 hours or as instructed by your caregiver.  MEDICATIONS You may resume your normal medications unless your caregiver tells you otherwise.  WHAT YOU CAN EXPECT TODAY You may experience abdominal discomfort such as a feeling of fullness or "gas" pains.  FOLLOW-UP Your doctor will discuss the results of your test with you.  SEEK IMMEDIATE MEDICAL ATTENTION IF ANY OF THE FOLLOWING OCCUR: Excessive nausea (feeling sick to your stomach) and/or vomiting.  Severe abdominal pain and distention (swelling).  Trouble swallowing.  Temperature over 101 F (37.8 C).  Rectal bleeding or vomiting of blood.       Stomach was inflamed.  Biopsies taken.    Further recommendations to follow pending review of pathology report  Office visit with Tana Coast in 4 to 6 weeks    at patient request, called Alycia Patten at [phone] -  discussed findings and recommendations

## 2022-12-06 NOTE — Op Note (Signed)
[facility] Patient Name: [name] Procedure Date: 12/06/2022 9:45 AM MRN: [id number] Date of Birth: 1935-12-07 Attending MD: Gennette Pac , MD, [id number] CSN: [id number] Age: 87 Admit Type: Outpatient Procedure:                Upper GI endoscopy Indications:              Generalized abdominal pain, nausea; abnormal                            imaging. Providers:                Gennette Pac, MD, Crystal Page, Dyann Ruddle, Elinor Parkinson Referring MD:             Gennette Pac, MD Medicines:                Propofol per Anesthesia Complications:            No immediate complications. Estimated Blood Loss:     Estimated blood loss was minimal. Procedure:                Pre-Anesthesia Assessment:                           - Prior to the procedure, a History and Physical                            was performed, and patient medications and                            allergies were reviewed. The patient's tolerance of                            previous anesthesia was also reviewed. The risks                            and benefits of the procedure and the sedation                            options and risks were discussed with the patient.                            All questions were answered, and informed consent                            was obtained. Prior Anticoagulants: The patient has                            taken no anticoagulant or antiplatelet agents. ASA                            Grade Assessment: III - A patient with severe  systemic disease. After reviewing the risks and                            benefits, the patient was deemed in satisfactory                            condition to undergo the procedure.                           After obtaining informed consent, the endoscope was                            passed under direct vision. Throughout the                             procedure, the patient's blood pressure, pulse, and                            oxygen saturations were monitored continuously. The                            [id number]-H190 ([id number]) scope was introduced through the                            mouth, and advanced to the second part of duodenum.                            The upper GI endoscopy was accomplished without                            difficulty. The patient tolerated the procedure                            well. Scope In: 10:12:36 AM Scope Out: 10:18:21 AM Total Procedure Duration: 0 hours 5 minutes 45 seconds  Findings:      The examined esophagus was normal. Stomach empty. Intact fundoplication.       Linear patchy erythema of the gastric body and antrum some subtle       adenomatous changes of the gastric antrum. No ulcer or infiltrating       process seen. Bulb and second portion appeared normal      Biopsies of the abnormal gastric mucosa taken for histologic study. Impression:               - Normal esophagus. Intact Nissen fundoplication.                            Abnormal stomach of doubtful clinical                            significance?"status post biopsy.                           -I suspect chronic soft tissue findings in the area  of the distal esophagus are related to stigmata of                            prior fundoplication. No change over nearly 5 years. Moderate Sedation:      Moderate (conscious) sedation was personally administered by an       anesthesia professional. The following parameters were monitored: oxygen       saturation, heart rate, blood pressure, respiratory rate, EKG, adequacy       of pulmonary ventilation, and response to care. Recommendation:           - Patient has a contact number available for                            emergencies. The signs and symptoms of potential                            delayed complications were discussed with the                             patient. Return to normal activities tomorrow.                            Written discharge instructions were provided to the                            patient.                           - Advance diet as tolerated.                           - Continue present medications. Follow-up on                            pathology. Office visit with [place] in 4 to 6 weeks.                            Follow-up with GYN regarding surveillance of pelvic                            cyst. Procedure Code(s):        --- Professional ---                           [phone], Esophagogastroduodenoscopy, flexible,                            transoral; diagnostic, including collection of                            specimen(s) by brushing or washing, when performed                            (separate procedure) Diagnosis Code(s):        --- Professional ---  R10.84, Generalized abdominal pain CPT copyright 2022 American Medical Association. All rights reserved. The codes documented in this report are preliminary and upon coder review may  be revised to meet current compliance requirements. Gerrit Friends. Dshawn Mcnay, MD Gennette Pac, MD 12/06/2022 10:47:51 AM This report has been signed electronically. Number of Addenda: 0

## 2022-12-07 DIAGNOSIS — R300 Dysuria: Secondary | ICD-10-CM | POA: Diagnosis not present

## 2022-12-07 DIAGNOSIS — R350 Frequency of micturition: Secondary | ICD-10-CM | POA: Diagnosis not present

## 2022-12-10 NOTE — Anesthesia Postprocedure Evaluation (Signed)
Anesthesia Post Note  Patient: [name]  Procedure(s) Performed: ESOPHAGOGASTRODUODENOSCOPY (EGD) WITH PROPOFOL BIOPSY  Patient location during evaluation: Phase II Anesthesia Type: General Level of consciousness: awake Pain management: pain level controlled Vital Signs Assessment: post-procedure vital signs reviewed and stable Respiratory status: spontaneous breathing and respiratory function stable Cardiovascular status: blood pressure returned to baseline and stable Postop Assessment: no headache and no apparent nausea or vomiting Anesthetic complications: no Comments: Late entry   No notable events documented.   Last Vitals:  Vitals:   12/06/22 0902 12/06/22 1028  BP: (!) 193/78 129/63  Pulse: (!) 59 (!) 58  Resp: 16 17  Temp: 36.5 C (!) 36.2 C  SpO2: 100% 100%    Last Pain:  Vitals:   12/07/22 1032  TempSrc:   PainSc: 0-No pain                 Windell Norfolk

## 2022-12-11 ENCOUNTER — Telehealth: Payer: Self-pay

## 2022-12-11 LAB — SURGICAL PATHOLOGY

## 2022-12-11 NOTE — Telephone Encounter (Signed)
-----   Message from Eula Listen sent at 12/11/2022  2:00 PM EDT ----- Please let pt know stomach biopsies showed no evidence of tumor or infection.  Keep f/u appt

## 2022-12-11 NOTE — Telephone Encounter (Signed)
Pt's son was made aware and verbalized understanding.

## 2022-12-14 ENCOUNTER — Telehealth: Payer: Self-pay | Admitting: Gastroenterology

## 2022-12-14 NOTE — Telephone Encounter (Signed)
Spoke with daughter in law and instructed her to have pt continue taking the ppi and to keep upcoming appt with leslie.

## 2022-12-14 NOTE — Telephone Encounter (Signed)
Lady caller left a message to say she wanted to know what to do about inflammation of the stomach.    She didn't leave a message and that is all that was said.  Call [phone]

## 2022-12-15 ENCOUNTER — Encounter (HOSPITAL_COMMUNITY): Payer: Self-pay | Admitting: Internal Medicine

## 2022-12-17 ENCOUNTER — Encounter: Payer: Self-pay | Admitting: Internal Medicine

## 2022-12-18 ENCOUNTER — Other Ambulatory Visit: Payer: Self-pay | Admitting: Internal Medicine

## 2022-12-28 DIAGNOSIS — G8929 Other chronic pain: Secondary | ICD-10-CM | POA: Diagnosis not present

## 2022-12-28 DIAGNOSIS — R2689 Other abnormalities of gait and mobility: Secondary | ICD-10-CM | POA: Diagnosis not present

## 2022-12-28 DIAGNOSIS — I10 Essential (primary) hypertension: Secondary | ICD-10-CM | POA: Diagnosis not present

## 2022-12-28 DIAGNOSIS — M797 Fibromyalgia: Secondary | ICD-10-CM | POA: Diagnosis not present

## 2022-12-29 DIAGNOSIS — N3001 Acute cystitis with hematuria: Secondary | ICD-10-CM | POA: Diagnosis not present

## 2022-12-29 DIAGNOSIS — R300 Dysuria: Secondary | ICD-10-CM | POA: Diagnosis not present

## 2023-01-02 ENCOUNTER — Ambulatory Visit: Payer: 59 | Admitting: Internal Medicine

## 2023-01-04 ENCOUNTER — Other Ambulatory Visit: Payer: Self-pay | Admitting: Internal Medicine

## 2023-01-04 DIAGNOSIS — G4701 Insomnia due to medical condition: Secondary | ICD-10-CM

## 2023-01-05 ENCOUNTER — Other Ambulatory Visit: Payer: Self-pay | Admitting: Internal Medicine

## 2023-01-05 DIAGNOSIS — J439 Emphysema, unspecified: Secondary | ICD-10-CM

## 2023-01-16 ENCOUNTER — Encounter: Payer: Self-pay | Admitting: Gastroenterology

## 2023-01-16 ENCOUNTER — Ambulatory Visit: Payer: 59 | Admitting: Gastroenterology

## 2023-01-16 VITALS — BP 170/83 | HR 64 | Temp 97.7°F | Ht 60.0 in | Wt 109.8 lb

## 2023-01-16 DIAGNOSIS — Z79891 Long term (current) use of opiate analgesic: Secondary | ICD-10-CM

## 2023-01-16 DIAGNOSIS — R110 Nausea: Secondary | ICD-10-CM

## 2023-01-16 DIAGNOSIS — Z87891 Personal history of nicotine dependence: Secondary | ICD-10-CM | POA: Diagnosis not present

## 2023-01-16 DIAGNOSIS — K219 Gastro-esophageal reflux disease without esophagitis: Secondary | ICD-10-CM

## 2023-01-16 DIAGNOSIS — N859 Noninflammatory disorder of uterus, unspecified: Secondary | ICD-10-CM | POA: Diagnosis not present

## 2023-01-16 DIAGNOSIS — I10 Essential (primary) hypertension: Secondary | ICD-10-CM

## 2023-01-16 DIAGNOSIS — R9389 Abnormal findings on diagnostic imaging of other specified body structures: Secondary | ICD-10-CM

## 2023-01-16 MED ORDER — RABEPRAZOLE SODIUM 20 MG PO TBEC: 20.0000 mg | DELAYED_RELEASE_TABLET | Freq: Two times a day (BID) | ORAL | 3 refills | Status: DC

## 2023-01-16 NOTE — Progress Notes (Signed)
GI Office Note    Referring Provider: Anabel Halon, MD Primary Care Physician:  Anabel Halon, MD  Primary Gastroenterologist: Roetta Sessions, MD   Chief Complaint   Chief Complaint  Patient presents with   Follow-up    Still having nausea    History of Present Illness   [name] is a 87 y.o. female presenting today for follow-up.  She has a history of chronic nausea at baseline.  Prior fundoplication, unable to vomit.  History of GERD, dyspepsia, constipation.  Presents today with complaints of ongoing daily nausea.  Cannot get relief.  Take Zofran every 6 hours, control symptoms for about 2 hours at a time.  Terrible reflux.  In the past she was on omeprazole and we switched her to pantoprazole twice daily but she felt like this was less effective and went back to omeprazole.  Currently on esomeprazole.  Continues to have heartburn on a regular basis.  Bowel movements daily.  No melena or rectal bleeding.  Some lower abdominal discomfort.  Denies dysuria.  Notes that she takes Percocet every 8 hours for chronic pain.  States she cannot live without pain management.  She has terrible arthritis.  Having issues with her blood pressure.  Sees multiple specialist including GI and cardiology.  Workup earlier this year revealed 3.2 cm cystic lesion in the right adnexal space new interval finding.  Pelvic ultrasound completed with recommendations for repeat imaging in 3 to 6 months.  We recommended GYN follow-up but patient has not completed at this time.  She is requesting referral.  She has a remote history of ?uterine cancer.      CT abdomen pelvis without contrast July 2024: IMPRESSION: 1. No specific findings to explain the patient's history of right-sided abdominal pain with nausea. 2. 3.2 cm cystic lesion in the right adnexal space is new in the interval. Pelvic ultrasound recommended to further evaluate. This recommendation follows ACR consensus guidelines: White  Paper of the ACR Incidental Findings Committee II on Adnexal Findings. J Am Coll Radiol [phone]. 3. Nodular soft tissue projects in the lumen of the distal esophagus. Similar appearance noted on the prior study 4. While this may represent sequelae of the Nissen fundoplication, upper endoscopy recommended to exclude polypoid mass lesion. 5. Chronic interstitial lung disease with diffuse bronchiectasis. 6.  Aortic Atherosclerosis (ICD10-I70.0).  EGD September 2024: -Normal esophagus.  Intact Nissen fundoplication.  Abnormal stomach of doubtful clinical significance status post biopsy.  Biopsy with chronic inactive gastritis and intestinal metaplasia.  Negative for H. pylori. -I suspect chronic soft tissue findings in the area of the distal esophagus are related to stigmata of prior fundoplication.  No change over nearly 5 years.  Pelvic ultrasound August 2024: IMPRESSION: 2.7 cm nonspecific cystic lesion in the right adnexa. Given the patient's history of malignancy, follow up examination recommended to ensure stability with repeat study in 3-6 months.  Wt Readings from Last 10 Encounters:  01/16/23 109 lb 12.8 oz (49.8 kg)  12/06/22 112 lb 14 oz (51.2 kg)  12/05/22 113 lb (51.3 kg)  12/04/22 109 lb 12.6 oz (49.8 kg)  09/25/22 109 lb 12.8 oz (49.8 kg)  08/14/22 103 lb 12.8 oz (47.1 kg)  07/12/22 105 lb (47.6 kg)  03/03/22 103 lb (46.7 kg)  10/31/21 104 lb (47.2 kg)  10/22/21 104 lb (47.2 kg)     Medications   Current Outpatient Medications  Medication Sig Dispense Refill   albuterol (VENTOLIN HFA) 108 (90  Base) MCG/ACT inhaler Inhale 1-2 puffs into the lungs every 6 (six) hours as needed for wheezing or shortness of breath. 8 g 5   amLODipine (NORVASC) 5 MG tablet Take 5 mg by mouth daily.     atorvastatin (LIPITOR) 20 MG tablet TAKE ONE TABLET BY MOUTH ONCE DAILY. 90 tablet 0   BREZTRI AEROSPHERE 160-9-4.8 MCG/ACT AERO INHALE 2 PUFFS TWICE DAILY. 10.7 g 0   carvedilol  (COREG) 6.25 MG tablet TAKE (1) TABLET BY MOUTH TWICE DAILY. (Patient taking differently: Take 6.25 mg by mouth 2 (two) times daily with a meal.) 180 tablet 0   esomeprazole (NEXIUM) 40 MG capsule Take 1 capsule (40 mg total) by mouth 2 (two) times daily before a meal. 60 capsule 5   furosemide (LASIX) 20 MG tablet Take 1 tablet (20 mg total) by mouth daily. 90 tablet 3   ipratropium-albuterol (DUONEB) 0.5-2.5 (3) MG/3ML SOLN INHALE 1 VIAL VIA NEBULIZER EVERY (6) HOURS AS NEEDED. 360 mL 2   levothyroxine (SYNTHROID) 75 MCG tablet Take 1 tablet (75 mcg total) by mouth daily before breakfast. 90 tablet 1   mirtazapine (REMERON) 7.5 MG tablet TAKE (1) TABLET BY MOUTH AT BEDTIME. 30 tablet 0   ondansetron (ZOFRAN) 4 MG tablet TAKE 1 TABLET BY MOUTH THREE TIMES AS NEEDED FOR NAUSEA. 90 tablet 0   oxyCODONE-acetaminophen (PERCOCET) 7.5-325 MG tablet Take 1 tablet by mouth 3 (three) times daily as needed.     No current facility-administered medications for this visit.    Allergies   Allergies as of 01/16/2023 - Review Complete 01/16/2023  Allergen Reaction Noted   Hydralazine Other (See Comments) 07/17/2019   Nalbuphine Rash    Paroxetine Itching    Sulfa antibiotics Other (See Comments) 12/13/2011   Venlafaxine Itching        Review of Systems   General: Negative for anorexia, weight loss, fever, chills, fatigue, weakness. ENT: Negative for hoarseness, difficulty swallowing , nasal congestion. CV: Negative for chest pain, angina, palpitations, dyspnea on exertion, peripheral edema.  Respiratory: Negative for dyspnea at rest, dyspnea on exertion, cough, sputum, wheezing.  GI: See history of present illness. GU:  Negative for dysuria, hematuria, urinary incontinence, urinary frequency, nocturnal urination.  Endo: Negative for unusual weight change.     Physical Exam   BP (!) 170/83 (BP Location: Left Arm, Patient Position: Sitting, Cuff Size: Normal)   Pulse 64   Temp 97.7 F (36.5 C)  (Oral)   Ht 5' (1.524 m)   Wt 109 lb 12.8 oz (49.8 kg)   SpO2 94%   BMI 21.44 kg/m    General: Well-nourished, well-developed in no acute distress.  Eyes: No icterus. Mouth: Oropharyngeal mucosa moist and pink  Abdomen: Bowel sounds are normal,  nondistended, no hepatosplenomegaly or masses,  no abdominal bruits or hernia , no rebound or guarding.  Mild generalized tenderness Rectal: Not performed Extremities: No lower extremity edema. No clubbing or deformities. Neuro: Alert and oriented x 4   Skin: Warm and dry, no jaundice.   Psych: Alert and cooperative, normal mood and affect.  Labs   Lab Results  Component Value Date   LIPASE 32 10/24/2022   Lab Results  Component Value Date   NA 139 10/24/2022   CL 107 (H) 10/24/2022   K 4.6 10/24/2022   CO2 19 (L) 10/24/2022   BUN 39 (H) 10/24/2022   CREATININE 2.41 (H) 10/24/2022   EGFR 19.0 10/31/2022   CALCIUM 9.2 10/24/2022   ALBUMIN 4.2 10/24/2022  GLUCOSE 107 (H) 10/24/2022   Lab Results  Component Value Date   ALT 16 10/24/2022   AST 17 10/24/2022   ALKPHOS 100 10/24/2022   BILITOT <0.2 10/24/2022   Lab Results  Component Value Date   WBC 5.8 10/24/2022   HGB 12.1 10/24/2022   HCT 37.3 10/24/2022   MCV 90 10/24/2022   PLT 232 10/24/2022      Imaging Studies   No results found.  Assessment/Plan:   GERD/nausea -Continues to have refractory heartburn, poorly controlled with PPI.  EGD on file.  Trial of different PPI. -Start rabeprazole 20 mg twice daily before meals -Call with progress report in a couple of weeks. -Nausea likely type factorial in part due to delayed gastric emptying in the setting of chronic opioid use. -Continue Zofran as needed -call with any new or worsening symptoms.  -Return office visit with Dr. Jena Gauss  -Right adnexal cystic lesion -Prior history of GYN malignancy,?Uterine -Previously advised GYN evaluation, patient ready to pursue at this point  Hypertension -Recommend  following up with PCP    Leanna Battles. Melvyn Neth, MHS, PA-C [facility]

## 2023-01-16 NOTE — Patient Instructions (Addendum)
Stop esomeprazole. Start rabeprazole 20mg  twice daily before a meal, breakfast and evening meal.   Call in a couple of weeks and let me know if you are feeling better.   Continue zofran as prescribed for nausea.   Referral to gynecology for evaluation of pelvic cyst.   Follow up with Dr. Allena Katz about elevated blood pressure.   Return office visit to see Dr. Jena Gauss in two months.

## 2023-01-17 ENCOUNTER — Other Ambulatory Visit: Payer: Self-pay | Admitting: Internal Medicine

## 2023-01-17 DIAGNOSIS — E039 Hypothyroidism, unspecified: Secondary | ICD-10-CM

## 2023-01-26 ENCOUNTER — Other Ambulatory Visit: Payer: Self-pay | Admitting: Internal Medicine

## 2023-01-29 ENCOUNTER — Encounter: Payer: Self-pay | Admitting: Internal Medicine

## 2023-01-29 ENCOUNTER — Ambulatory Visit (INDEPENDENT_AMBULATORY_CARE_PROVIDER_SITE_OTHER): Payer: 59 | Admitting: Internal Medicine

## 2023-01-29 VITALS — BP 154/90 | HR 64 | Ht 60.0 in

## 2023-01-29 DIAGNOSIS — E782 Mixed hyperlipidemia: Secondary | ICD-10-CM

## 2023-01-29 DIAGNOSIS — I5032 Chronic diastolic (congestive) heart failure: Secondary | ICD-10-CM

## 2023-01-29 DIAGNOSIS — E039 Hypothyroidism, unspecified: Secondary | ICD-10-CM

## 2023-01-29 DIAGNOSIS — N184 Chronic kidney disease, stage 4 (severe): Secondary | ICD-10-CM | POA: Diagnosis not present

## 2023-01-29 DIAGNOSIS — K219 Gastro-esophageal reflux disease without esophagitis: Secondary | ICD-10-CM | POA: Diagnosis not present

## 2023-01-29 DIAGNOSIS — R110 Nausea: Secondary | ICD-10-CM | POA: Diagnosis not present

## 2023-01-29 DIAGNOSIS — R739 Hyperglycemia, unspecified: Secondary | ICD-10-CM

## 2023-01-29 DIAGNOSIS — I10 Essential (primary) hypertension: Secondary | ICD-10-CM

## 2023-01-29 DIAGNOSIS — Z23 Encounter for immunization: Secondary | ICD-10-CM | POA: Diagnosis not present

## 2023-01-29 DIAGNOSIS — J439 Emphysema, unspecified: Secondary | ICD-10-CM | POA: Diagnosis not present

## 2023-01-29 DIAGNOSIS — G4701 Insomnia due to medical condition: Secondary | ICD-10-CM | POA: Diagnosis not present

## 2023-01-29 DIAGNOSIS — Z0001 Encounter for general adult medical examination with abnormal findings: Secondary | ICD-10-CM

## 2023-01-29 MED ORDER — METOCLOPRAMIDE HCL 5 MG PO TABS
5.0000 mg | ORAL_TABLET | Freq: Three times a day (TID) | ORAL | 2 refills | Status: DC | PRN
Start: 2023-01-29 — End: 2023-03-29

## 2023-01-29 MED ORDER — MIRTAZAPINE 7.5 MG PO TABS
7.5000 mg | ORAL_TABLET | Freq: Every day | ORAL | 5 refills | Status: DC
Start: 2023-01-29 — End: 2023-07-30

## 2023-01-29 NOTE — Patient Instructions (Addendum)
Please start taking Metoclopramide as needed for nausea instead of Zofran.  Please continue to take medications as prescribed.  Please continue to follow low salt diet and ambulate as tolerated.  Please get fasting blood tests done within a week.

## 2023-01-30 ENCOUNTER — Encounter: Payer: Self-pay | Admitting: Internal Medicine

## 2023-01-30 NOTE — Assessment & Plan Note (Signed)
BP Readings from Last 1 Encounters:  01/29/23 (!) 144/80   Well-controlled for her age, with Coreg 6.25 mg twice daily and Chlorthalidone 12.5 mg QD Counseled for compliance with the medications Advised DASH diet Chronic pain and CKD also contributing to high BP Followed by Nephrologist

## 2023-01-30 NOTE — Assessment & Plan Note (Signed)
On Remeron for insomnia and for weight gain purpose Her insomnia could be due to cognitive decline from dementia as well

## 2023-01-30 NOTE — Assessment & Plan Note (Signed)
Physical exam as documented. Recent blood tests reviewed. Gets routing blood work at Nephrology. Flu vaccine today. Advised to get Shingrix vaccine at local pharmacy.

## 2023-01-30 NOTE — Assessment & Plan Note (Signed)
Chronic leg swelling and dyspnea Needs leg elevation for leg swelling On chlorthalidone currently

## 2023-01-30 NOTE — Progress Notes (Signed)
Established Patient Office Visit  Subjective:  Patient ID: [name], female    DOB: 19-Jun-1935  Age: 87 y.o. MRN: [id number]  CC:  Chief Complaint  Patient presents with   Nausea    Patient states she stays nauseous   Annual Exam    HPI [name] is a 87 y.o. female with past medical history of hypertension, CKD stage IV, fibromyalgia, chronic pelvic pain, chronic constipation, hypothyroidism, osteoarthritis of knees and depression who presents for annual physical.  She was evaluated by GI for persistent nausea.  EGD was unremarkable overall except patchy erythema of gastric mucosa.  She was placed on rabeprazole by GI.  She also takes Zofran as needed for nausea, but still has persistent nausea.  HTN: Her BP is well-controlled for her age. Followed by nephrology.  Denies any chest pain, headache or dizziness currently.  CKD stage IV: Followed by nephrology. Denies any fever, chills or hematuria currently.  Hypothyroidism: Has been taking levothyroxine 75 mcg QD.  Her TSH was low during ER visit in 04/24, but had been WNL before that. Denies any tremors, palpitations or jitteriness. She had noticed lack of appetite and weight loss as well, but has been better with Remeron now.  COPD: Her breathing has improved with Advair and as needed albuterol now.  She denies any acute worsening of dyspnea or wheezing currently.  She was placed on Remeron for insomnia and improving her appetite.  She reports mild improvement in appetite.  She has a history of dementia as well, but denies agitation.  Past Medical History:  Diagnosis Date   Anxiety disorder, unspecified    Arteriosclerotic cardiovascular disease (ASCVD) 2006   Nonobstructive; < 50% lesions on cath 2002; negative stress nuclear study in 10/2004   Asthma    Chest pain, unspecified    Chronic kidney disease, unspecified    Chronic obstructive pulmonary disease, unspecified (HCC)    Chronic pelvic pain in female     Colitis due to Clostridium difficile 1994   1994   COPD (chronic obstructive pulmonary disease) (HCC)    Depression with anxiety    Diarrhea, unspecified    Disorder of thyroid, unspecified    Gastro-esophageal reflux disease without esophagitis    Gastroesophageal reflux disease    Hyperlipidemia    Hyperlipidemia, unspecified    Hypertension    Hypertensive chronic kidney disease with stage 1 through stage 4 chronic kidney disease, or unspecified chronic kidney disease    Hyperthyroidism    Hypothyroidism    Hypothyroidism, unspecified    Kidney disease, chronic, stage III (moderate, EGFR 30-59 ml/min) (HCC)    Lower abdominal pain, unspecified    Major depressive disorder, single episode, severe without psychotic features (HCC)    Major depressive disorder, single episode, unspecified    Nausea    Other postherpetic nervous system involvement    Pelvic and perineal pain    Rash and other nonspecific skin eruption    S/P colonoscopy 2007   few diverticula, otherwise nl   S/P colonoscopy 12/13/10   rectal, cecal polyp, left-sided diverticulosis, hyperplastic. ?anal fissure? treated empirically   S/P endoscopy 2009   linear esophageal erosions   S/P endoscopy 05/10/10   Question island of salmon-colored epithelium in distal esophagus ; no Barrett's.    Shingles    Tobacco abuse, in remission    55-pack-year consumption; discontinued 08/2010   Unspecified asthma, uncomplicated    Unspecified osteoarthritis, unspecified site    Urinary tract infection, site  not specified     Past Surgical History:  Procedure Laterality Date   ABDOMINAL HYSTERECTOMY     BIOPSY  12/06/2022   Procedure: BIOPSY;  Surgeon: Corbin Ade, MD;  [facility];  Service: Endoscopy;;   BREAST LUMPECTOMY     CATARACT EXTRACTION     CHOLECYSTECTOMY  1962   COLONOSCOPY  12/13/2010   Left-sided diverticulosis.  Cecal polyp, status post hot snare polypectomy/ Diminutive rectal polyp, status post  cold biopsy removal tender/painful anal canal, ? occult anal fissure- Not visualized   COLONOSCOPY N/A 10/14/2015   Diverticulosis   ESOPHAGOGASTRODUODENOSCOPY  05/10/10   benign mucosa with mild chronic inflammation.   ESOPHAGOGASTRODUODENOSCOPY (EGD) WITH PROPOFOL N/A 02/13/2019   Procedure: ESOPHAGOGASTRODUODENOSCOPY (EGD) WITH PROPOFOL;  Surgeon: Corbin Ade, MD;  [facility];  Service: Endoscopy;  Laterality: N/A;  3:00pm   ESOPHAGOGASTRODUODENOSCOPY (EGD) WITH PROPOFOL N/A 12/06/2022   Procedure: ESOPHAGOGASTRODUODENOSCOPY (EGD) WITH PROPOFOL;  Surgeon: Corbin Ade, MD;  [facility];  Service: Endoscopy;  Laterality: N/A;  2:30 PM, ASA 3/4   FLEXIBLE SIGMOIDOSCOPY  12/29/2011   Procedure: FLEXIBLE SIGMOIDOSCOPY;  Surgeon: Malissa Hippo, MD;  [facility];  Service: Endoscopy;  Laterality: N/A;  200-Ann notified pt to be here @ 1:00   NISSEN FUNDOPLICATION     YAG LASER APPLICATION Left 05/25/2014   Procedure: YAG LASER APPLICATION;  Surgeon: Susa Simmonds, MD;  Location: [facility];  Service: Ophthalmology;  Laterality: Left;    Family History  Problem Relation Age of Onset   Diabetes Mother    Alzheimer's disease Mother    Cancer Mother    Heart disease Father    Cancer Sister    Heart disease Brother        Also mother, Father, brother and 2 sons   Cancer Son        testicular cancer    Aneurysm Son    Diabetes Son    Hypertension Brother        also Sister x2   Colon cancer Neg Hx     Social History   Socioeconomic History   Marital status: Married    Spouse name: Not on file   Number of children: 4   Years of education: Not on file   Highest education level: Not on file  Occupational History   Not on file  Tobacco Use   Smoking status: Former    Current packs/day: 0.00    Average packs/day: 1 pack/day for 55.0 years (55.0 ttl pk-yrs)    Types: Cigarettes    Start date: 09/15/1955    Quit date: 09/15/2010    Years since  quitting: 12.3   Smokeless tobacco: Never  Vaping Use   Vaping status: Never Used  Substance and Sexual Activity   Alcohol use: No    Alcohol/week: 0.0 standard drinks of alcohol   Drug use: No   Sexual activity: Not Currently  Other Topics Concern   Not on file  Social History Narrative   Not on file   Social Determinants of Health   Financial Resource Strain: Low Risk  (02/07/2022)   Overall Financial Resource Strain (CARDIA)    Difficulty of Paying Living Expenses: Not hard at all  Food Insecurity: No Food Insecurity (02/07/2022)   Hunger Vital Sign    Worried About Running Out of Food in the Last Year: Never true    Ran Out of Food in the Last Year: Never true  Transportation Needs: No Transportation Needs (02/07/2022)   PRAPARE - Administrator, Civil Service (Medical): No    Lack of Transportation (Non-Medical): No  Physical Activity: Inactive (09/06/2020)   Exercise Vital Sign    Days of Exercise per Week: 0 days    Minutes of Exercise per Session: 0 min  Stress: No Stress Concern Present (02/07/2022)   [employer] of Occupational Health - Occupational Stress Questionnaire    Feeling of Stress : Not at all  Social Connections: Socially Isolated (02/07/2022)   Social Connection and Isolation Panel [NHANES]    Frequency of Communication with Friends and Family: More than three times a week    Frequency of Social Gatherings with Friends and Family: Once a week    Attends Religious Services: Never    Database administrator or Organizations: No    Attends Banker Meetings: Never    Marital Status: Separated  Intimate Partner Violence: Not At Risk (09/06/2020)   Humiliation, Afraid, Rape, and Kick questionnaire    Fear of Current or Ex-Partner: No    Emotionally Abused: No    Physically Abused: No    Sexually Abused: No    Outpatient Medications Prior to Visit  Medication Sig Dispense Refill   albuterol (VENTOLIN HFA) 108 (90 Base)  MCG/ACT inhaler Inhale 1-2 puffs into the lungs every 6 (six) hours as needed for wheezing or shortness of breath. 8 g 5   amLODipine (NORVASC) 5 MG tablet Take 5 mg by mouth daily.     atorvastatin (LIPITOR) 20 MG tablet TAKE ONE TABLET BY MOUTH ONCE DAILY. 90 tablet 0   BREZTRI AEROSPHERE 160-9-4.8 MCG/ACT AERO INHALE 2 PUFFS TWICE DAILY. 10.7 g 0   carvedilol (COREG) 6.25 MG tablet TAKE (1) TABLET BY MOUTH TWICE DAILY. (Patient taking differently: Take 6.25 mg by mouth 2 (two) times daily with a meal.) 180 tablet 0   furosemide (LASIX) 20 MG tablet Take 1 tablet (20 mg total) by mouth daily. 90 tablet 3   ipratropium-albuterol (DUONEB) 0.5-2.5 (3) MG/3ML SOLN INHALE 1 VIAL VIA NEBULIZER EVERY (6) HOURS AS NEEDED. 360 mL 2   levothyroxine (SYNTHROID) 75 MCG tablet TAKE 1 TABLET BY MOUTH DAILY BEFORE BREAKFAST 90 tablet 1   oxyCODONE-acetaminophen (PERCOCET) 7.5-325 MG tablet Take 1 tablet by mouth 3 (three) times daily as needed.     RABEprazole (ACIPHEX) 20 MG tablet Take 1 tablet (20 mg total) by mouth 2 (two) times daily before a meal. 180 tablet 3   mirtazapine (REMERON) 7.5 MG tablet TAKE (1) TABLET BY MOUTH AT BEDTIME. 30 tablet 0   ondansetron (ZOFRAN) 4 MG tablet TAKE 1 TABLET BY MOUTH THREE TIMES AS NEEDED FOR NAUSEA. 90 tablet 0   No facility-administered medications prior to visit.    Allergies  Allergen Reactions   Hydralazine Other (See Comments)    Breathing   Nalbuphine Rash   Paroxetine Itching   Sulfa Antibiotics Other (See Comments)    Childhood reaction.   Venlafaxine Itching    ROS Review of Systems  Constitutional:  Positive for fatigue. Negative for appetite change, chills and fever.  HENT:  Negative for congestion, sinus pressure, sinus pain and sore throat.   Eyes:  Negative for pain and discharge.  Respiratory:  Positive for shortness of breath. Negative for cough.   Cardiovascular:  Positive for leg swelling. Negative for chest pain and palpitations.   Gastrointestinal:  Positive for nausea. Negative for abdominal pain, diarrhea and vomiting.  Endocrine: Negative for polydipsia and polyuria.  Genitourinary:  Negative for dysuria, frequency and hematuria.  Musculoskeletal:  Positive for arthralgias and back pain. Negative for neck pain and neck stiffness.  Skin:  Negative for rash.  Neurological:  Negative for dizziness and weakness.  Psychiatric/Behavioral:  Positive for sleep disturbance. Negative for agitation and behavioral problems.       Objective:    Physical Exam Vitals reviewed.  Constitutional:      General: She is not in acute distress.    Appearance: She is not diaphoretic.     Comments: In wheelchair  HENT:     Head: Normocephalic and atraumatic.     Nose: Nose normal.     Mouth/Throat:     Mouth: Mucous membranes are moist.  Eyes:     General: No scleral icterus.    Extraocular Movements: Extraocular movements intact.  Cardiovascular:     Rate and Rhythm: Normal rate and regular rhythm.     Heart sounds: Normal heart sounds. No murmur heard. Pulmonary:     Breath sounds: Normal breath sounds. No wheezing or rales.  Abdominal:     Palpations: Abdomen is soft.     Tenderness: There is no abdominal tenderness.  Musculoskeletal:        General: Swelling (Right knee, no warmth) present.     Cervical back: Neck supple. No tenderness.     Right lower leg: No edema.     Left lower leg: No edema.  Skin:    General: Skin is warm.     Findings: Erythema (Senile purpuric lesions (b/l UE)) present.  Neurological:     General: No focal deficit present.     Mental Status: She is alert and oriented to person, place, and time. Mental status is at baseline.     Sensory: No sensory deficit.     Motor: Weakness (3+ in b/l UE and LE) present.     Gait: Gait abnormal.  Psychiatric:        Mood and Affect: Mood normal.        Behavior: Behavior normal.     BP (!) 144/80 (BP Location: Left Arm)   Pulse 64   Ht 5'  (1.524 m)   SpO2 94%   BMI 21.44 kg/m  Wt Readings from Last 3 Encounters:  01/16/23 109 lb 12.8 oz (49.8 kg)  12/06/22 112 lb 14 oz (51.2 kg)  12/05/22 113 lb (51.3 kg)    Lab Results  Component Value Date   TSH 0.116 (L) 07/12/2022   Lab Results  Component Value Date   WBC 5.8 10/24/2022   HGB 12.1 10/24/2022   HCT 37.3 10/24/2022   MCV 90 10/24/2022   PLT 232 10/24/2022   Lab Results  Component Value Date   NA 139 10/24/2022   K 4.6 10/24/2022   CO2 19 (L) 10/24/2022   GLUCOSE 107 (H) 10/24/2022   BUN 39 (H) 10/24/2022   CREATININE 2.41 (H) 10/24/2022   BILITOT <0.2 10/24/2022   ALKPHOS 100 10/24/2022   AST 17 10/24/2022   ALT 16 10/24/2022   PROT 6.9 10/24/2022   ALBUMIN 4.2 10/24/2022   CALCIUM 9.2 10/24/2022   ANIONGAP 7 07/12/2022   EGFR 19.0 10/31/2022   Lab Results  Component Value Date   CHOL 164 09/17/2018   Lab Results  Component Value Date   HDL 44 09/17/2018   Lab Results  Component Value Date   LDLCALC 95 09/17/2018   Lab Results  Component Value  Date   TRIG 145 09/17/2018   Lab Results  Component Value Date   CHOLHDL 2.9 07/15/2008   Lab Results  Component Value Date   HGBA1C 5.3 06/14/2020      Assessment & Plan:   Problem List Items Addressed This Visit       Cardiovascular and Mediastinum   Primary hypertension (Chronic)    BP Readings from Last 1 Encounters:  01/29/23 (!) 144/80   Well-controlled for her age, with Coreg 6.25 mg twice daily and Chlorthalidone 12.5 mg QD Counseled for compliance with the medications Advised DASH diet Chronic pain and CKD also contributing to high BP Followed by Nephrologist      (HFpEF) heart failure with preserved ejection fraction (HCC)    Chronic leg swelling and dyspnea Needs leg elevation for leg swelling On chlorthalidone currently        Respiratory   Chronic obstructive pulmonary disease, unspecified (HCC)    Well controlled now with Advair - needs to rinse mouth  after each use Has Duoneb for better compliance and has had better response with nebulizer during ER visits Educated about use of maintenance therapy and rescue inhaler        Digestive   Gastro-esophageal reflux disease without esophagitis    Has been placed on rabeprazole by GI, was on omeprazole in the past Gas-X or Mylanta as needed for bloating Her chronic nausea could be due to gastroparesis-switched to Reglan from Zofran      Relevant Medications   metoCLOPramide (REGLAN) 5 MG tablet     Endocrine   Hypothyroidism    Lab Results  Component Value Date   TSH 0.116 (L) 07/12/2022    Had decreased dose of levothyroxine to 75 mcg QD Check TSH, free T4 Reviewed US thyroid      Relevant Orders   TSH + free T4     Genitourinary   CKD (chronic kidney disease) stage 4, GFR 15-29 ml/min (HCC)    Follows up with nephrologist Follows up with hematooncologist for evaluation of elevated light chain levels Advised to follow renal diet as advised by nephrologist and dietitian Avoid nephrotoxic agents including NSAIDs Last BMP reviewed from nephrology visit - needs follow up visit        Other   Hyperlipidemia    On Atorvastatin 20 mg QD      Relevant Orders   Lipid Profile   Encounter for general adult medical examination with abnormal findings - Primary    Physical exam as documented. Recent blood tests reviewed. Gets routing blood work at Nephrology. Flu vaccine today. Advised to get Shingrix vaccine at local pharmacy.      Insomnia due to medical condition    On Remeron for insomnia and for weight gain purpose Her insomnia could be due to cognitive decline from dementia as well      Relevant Medications   mirtazapine (REMERON) 7.5 MG tablet   Nausea    Her chronic nausea could be due to gastroparesis-switched to Reglan from Zofran Follow-up with GI      Relevant Medications   metoCLOPramide (REGLAN) 5 MG tablet   Other Visit Diagnoses     Hyperglycemia        Relevant Orders   Hemoglobin A1c   Encounter for immunization       Relevant Orders   Flu Vaccine Trivalent High Dose (Fluad) (Completed)       Meds ordered this encounter  Medications   metoCLOPramide (REGLAN) 5 MG tablet  Sig: Take 1 tablet (5 mg total) by mouth every 8 (eight) hours as needed for nausea.    Dispense:  60 tablet    Refill:  2   mirtazapine (REMERON) 7.5 MG tablet    Sig: Take 1 tablet (7.5 mg total) by mouth at bedtime.    Dispense:  30 tablet    Refill:  5    Follow-up: Return in about 6 months (around 07/29/2023) for CKD and hypothyroidism.    Anabel Halon, MD

## 2023-01-30 NOTE — Assessment & Plan Note (Signed)
Well controlled now with Advair - needs to rinse mouth after each use Has Duoneb for better compliance and has had better response with nebulizer during ER visits Educated about use of maintenance therapy and rescue inhaler

## 2023-01-30 NOTE — Assessment & Plan Note (Signed)
Lab Results  Component Value Date   TSH 0.116 (L) 07/12/2022    Had decreased dose of levothyroxine to 75 mcg QD Check TSH, free T4 Reviewed US thyroid

## 2023-01-30 NOTE — Assessment & Plan Note (Signed)
On Atorvastatin 20 mg QD 

## 2023-01-30 NOTE — Assessment & Plan Note (Signed)
Has been placed on rabeprazole by GI, was on omeprazole in the past Gas-X or Mylanta as needed for bloating Her chronic nausea could be due to gastroparesis-switched to Reglan from Zofran

## 2023-01-30 NOTE — Assessment & Plan Note (Signed)
Follows up with nephrologist Follows up with hematooncologist for evaluation of elevated light chain levels Advised to follow renal diet as advised by nephrologist and dietitian Avoid nephrotoxic agents including NSAIDs Last BMP reviewed from nephrology visit - needs follow up visit

## 2023-01-30 NOTE — Assessment & Plan Note (Addendum)
Her chronic nausea could be due to gastroparesis-switched to Reglan from Zofran Follow-up with GI

## 2023-02-07 ENCOUNTER — Ambulatory Visit: Payer: 59 | Admitting: Obstetrics & Gynecology

## 2023-02-07 ENCOUNTER — Encounter: Payer: Self-pay | Admitting: Obstetrics & Gynecology

## 2023-02-07 VITALS — BP 180/84 | HR 64 | Ht 60.0 in | Wt 114.2 lb

## 2023-02-07 DIAGNOSIS — E782 Mixed hyperlipidemia: Secondary | ICD-10-CM | POA: Diagnosis not present

## 2023-02-07 DIAGNOSIS — E039 Hypothyroidism, unspecified: Secondary | ICD-10-CM | POA: Diagnosis not present

## 2023-02-07 DIAGNOSIS — N83201 Unspecified ovarian cyst, right side: Secondary | ICD-10-CM | POA: Diagnosis not present

## 2023-02-07 DIAGNOSIS — R739 Hyperglycemia, unspecified: Secondary | ICD-10-CM | POA: Diagnosis not present

## 2023-02-07 NOTE — Progress Notes (Signed)
GYN VISIT Patient name: [name] MRN [id number]  Date of birth: 1935/10/21 Chief Complaint:   discuss pelvic US results  History of Present Illness:   [name] is a 87 y.o. [id number] PM, PH female being seen today for the following concerns:  -Ovarian cyst:  Incidental finding on CT completed in August.  Pt notes CT was completed due pelvic pain noted to have UTI and states since treatment her symptoms have resolved.  Denies pelvic pain currently.  Denies bloating or discomfort.  No nausea/vomiting.  S/p hysterectomy completed for cervical cancer in her 46s  Imaging reviewed:  Simple 2.7cm simple appearing cyst noted.    No LMP recorded. Patient has had a hysterectomy.    Review of Systems:   Pertinent items are noted in HPI Denies fever/chills, dizziness, headaches, visual disturbances, fatigue, shortness of breath, chest pain, abdominal pain, vomiting.  Pertinent History Reviewed:   Past Surgical History:  Procedure Laterality Date   ABDOMINAL HYSTERECTOMY     BIOPSY  12/06/2022   Procedure: BIOPSY;  Surgeon: Corbin Ade, MD;  [facility];  Service: Endoscopy;;   BREAST LUMPECTOMY     CATARACT EXTRACTION     CHOLECYSTECTOMY  1962   COLONOSCOPY  12/13/2010   Left-sided diverticulosis.  Cecal polyp, status post hot snare polypectomy/ Diminutive rectal polyp, status post cold biopsy removal tender/painful anal canal, ? occult anal fissure- Not visualized   COLONOSCOPY N/A 10/14/2015   Diverticulosis   ESOPHAGOGASTRODUODENOSCOPY  05/10/10   benign mucosa with mild chronic inflammation.   ESOPHAGOGASTRODUODENOSCOPY (EGD) WITH PROPOFOL N/A 02/13/2019   Procedure: ESOPHAGOGASTRODUODENOSCOPY (EGD) WITH PROPOFOL;  Surgeon: Corbin Ade, MD;  [facility];  Service: Endoscopy;  Laterality: N/A;  3:00pm   ESOPHAGOGASTRODUODENOSCOPY (EGD) WITH PROPOFOL N/A 12/06/2022   Procedure: ESOPHAGOGASTRODUODENOSCOPY (EGD) WITH PROPOFOL;  Surgeon: Corbin Ade, MD;  [facility];  Service: Endoscopy;  Laterality: N/A;  2:30 PM, ASA 3/4   FLEXIBLE SIGMOIDOSCOPY  12/29/2011   Procedure: FLEXIBLE SIGMOIDOSCOPY;  Surgeon: Malissa Hippo, MD;  [facility];  Service: Endoscopy;  Laterality: N/A;  200-Ann notified pt to be here @ 1:00   NISSEN FUNDOPLICATION     YAG LASER APPLICATION Left 05/25/2014   Procedure: YAG LASER APPLICATION;  Surgeon: Susa Simmonds, MD;  Location: [facility];  Service: Ophthalmology;  Laterality: Left;    Past Medical History:  Diagnosis Date   Anxiety disorder, unspecified    Arteriosclerotic cardiovascular disease (ASCVD) 2006   Nonobstructive; < 50% lesions on cath 2002; negative stress nuclear study in 10/2004   Asthma    Chest pain, unspecified    Chronic kidney disease, unspecified    Chronic obstructive pulmonary disease, unspecified (HCC)    Chronic pelvic pain in female    Colitis due to Clostridium difficile 1994   1994   COPD (chronic obstructive pulmonary disease) (HCC)    Depression with anxiety    Diarrhea, unspecified    Disorder of thyroid, unspecified    Gastro-esophageal reflux disease without esophagitis    Gastroesophageal reflux disease    Hyperlipidemia    Hyperlipidemia, unspecified    Hypertension    Hypertensive chronic kidney disease with stage 1 through stage 4 chronic kidney disease, or unspecified chronic kidney disease    Hyperthyroidism    Hypothyroidism    Hypothyroidism, unspecified    Kidney disease, chronic, stage III (moderate, EGFR 30-59 ml/min) (HCC)    Lower abdominal pain, unspecified  Major depressive disorder, single episode, severe without psychotic features (HCC)    Major depressive disorder, single episode, unspecified    Nausea    Other postherpetic nervous system involvement    Pelvic and perineal pain    Rash and other nonspecific skin eruption    S/P colonoscopy 2007   few diverticula, otherwise nl   S/P colonoscopy 12/13/2010    rectal, cecal polyp, left-sided diverticulosis, hyperplastic. ?anal fissure? treated empirically   S/P endoscopy 2009   linear esophageal erosions   S/P endoscopy 05/10/2010   Question island of salmon-colored epithelium in distal esophagus ; no Barrett's.    Shingles    Tobacco abuse, in remission    55-pack-year consumption; discontinued 08/2010   Unspecified asthma, uncomplicated    Unspecified osteoarthritis, unspecified site    Urinary tract infection, site not specified    Uterine cancer [facility])    Reviewed problem list, medications and allergies. Physical Assessment:   Vitals:   02/07/23 0935 02/07/23 0947  BP: (!) 172/82 (!) 180/84  Pulse: 63 64  Weight: 114 lb 3.2 oz (51.8 kg)   Height: 5' (1.524 m)   Body mass index is 22.3 kg/m.       Physical Examination:   General appearance: alert, well appearing, and in no distress  Psych: mood appropriate, normal affect  Skin: warm & dry   Cardiovascular: normal heart rate noted  Respiratory: normal respiratory effort, no distress  Abdomen: soft, non-tender, no rebound, no guarding  Pelvic: examination not indicated  Extremities: no edema   Chaperone: N/A    Assessment & Plan:  1) Right ovarian cyst -reviewed US findings and discussed likely benign simple cyst -pt asymptomatic -plan for repeat imaging in ~ 3mos -she reports that if there was concern for malignancy, she would desires surgical intervention  Orders Placed This Encounter  Procedures   US PELVIC COMPLETE WITH TRANSVAGINAL    Return for pelvic US end Jan/Feb then follow up with Keyry Iracheta.   Myna Hidalgo, DO Attending Obstetrician & Gynecologist, [facility] for [employer], [facility]

## 2023-02-08 ENCOUNTER — Other Ambulatory Visit: Payer: Self-pay | Admitting: Internal Medicine

## 2023-02-08 LAB — LIPID PANEL
Chol/HDL Ratio: 2.6 ratio (ref 0.0–4.4)
Cholesterol, Total: 148 mg/dL (ref 100–199)
HDL: 58 mg/dL (ref >39)
LDL Chol Calc (NIH): 75 mg/dL (ref 0–99)
Triglycerides: 78 mg/dL (ref 0–149)
VLDL Cholesterol Cal: 15 mg/dL (ref 5–40)

## 2023-02-08 LAB — HEMOGLOBIN A1C
Est. average glucose Bld gHb Est-mCnc: 111 mg/dL
Hgb A1c MFr Bld: 5.5 % (ref 4.8–5.6)

## 2023-02-08 LAB — TSH+FREE T4
Free T4: 1.22 ng/dL (ref 0.82–1.77)
TSH: 4.22 u[IU]/mL (ref 0.450–4.500)

## 2023-02-28 NOTE — Progress Notes (Addendum)
This encounter was created in error - please disregard.  Patient declined AWV 02/28/2023/SH

## 2023-03-05 ENCOUNTER — Encounter (HOSPITAL_COMMUNITY): Payer: Self-pay

## 2023-03-05 ENCOUNTER — Emergency Department (HOSPITAL_COMMUNITY): Payer: 59

## 2023-03-05 ENCOUNTER — Emergency Department (HOSPITAL_COMMUNITY)
Admission: EM | Admit: 2023-03-05 | Discharge: 2023-03-05 | Disposition: A | Discharge status: Home / Self Care | Payer: 59 | Attending: Emergency Medicine | Admitting: Emergency Medicine

## 2023-03-05 ENCOUNTER — Other Ambulatory Visit: Payer: Self-pay

## 2023-03-05 DIAGNOSIS — Z7951 Long term (current) use of inhaled steroids: Secondary | ICD-10-CM | POA: Diagnosis not present

## 2023-03-05 DIAGNOSIS — N281 Cyst of kidney, acquired: Secondary | ICD-10-CM | POA: Diagnosis not present

## 2023-03-05 DIAGNOSIS — R059 Cough, unspecified: Secondary | ICD-10-CM | POA: Diagnosis not present

## 2023-03-05 DIAGNOSIS — R109 Unspecified abdominal pain: Secondary | ICD-10-CM

## 2023-03-05 DIAGNOSIS — R1030 Lower abdominal pain, unspecified: Secondary | ICD-10-CM | POA: Insufficient documentation

## 2023-03-05 DIAGNOSIS — I130 Hypertensive heart and chronic kidney disease with heart failure and stage 1 through stage 4 chronic kidney disease, or unspecified chronic kidney disease: Secondary | ICD-10-CM | POA: Insufficient documentation

## 2023-03-05 DIAGNOSIS — J45909 Unspecified asthma, uncomplicated: Secondary | ICD-10-CM | POA: Insufficient documentation

## 2023-03-05 DIAGNOSIS — I5030 Unspecified diastolic (congestive) heart failure: Secondary | ICD-10-CM | POA: Diagnosis not present

## 2023-03-05 DIAGNOSIS — Z87891 Personal history of nicotine dependence: Secondary | ICD-10-CM | POA: Insufficient documentation

## 2023-03-05 DIAGNOSIS — R079 Chest pain, unspecified: Secondary | ICD-10-CM | POA: Diagnosis not present

## 2023-03-05 DIAGNOSIS — Z20822 Contact with and (suspected) exposure to covid-19: Secondary | ICD-10-CM | POA: Insufficient documentation

## 2023-03-05 DIAGNOSIS — K5730 Diverticulosis of large intestine without perforation or abscess without bleeding: Secondary | ICD-10-CM | POA: Diagnosis not present

## 2023-03-05 DIAGNOSIS — N189 Chronic kidney disease, unspecified: Secondary | ICD-10-CM | POA: Diagnosis not present

## 2023-03-05 DIAGNOSIS — R1013 Epigastric pain: Secondary | ICD-10-CM | POA: Insufficient documentation

## 2023-03-05 DIAGNOSIS — R0789 Other chest pain: Secondary | ICD-10-CM | POA: Diagnosis not present

## 2023-03-05 DIAGNOSIS — R6889 Other general symptoms and signs: Secondary | ICD-10-CM | POA: Diagnosis not present

## 2023-03-05 DIAGNOSIS — R531 Weakness: Secondary | ICD-10-CM | POA: Diagnosis not present

## 2023-03-05 DIAGNOSIS — J449 Chronic obstructive pulmonary disease, unspecified: Secondary | ICD-10-CM | POA: Diagnosis not present

## 2023-03-05 DIAGNOSIS — R1010 Upper abdominal pain, unspecified: Secondary | ICD-10-CM | POA: Diagnosis present

## 2023-03-05 DIAGNOSIS — I10 Essential (primary) hypertension: Secondary | ICD-10-CM | POA: Diagnosis not present

## 2023-03-05 DIAGNOSIS — R1084 Generalized abdominal pain: Secondary | ICD-10-CM | POA: Diagnosis not present

## 2023-03-05 DIAGNOSIS — Z79899 Other long term (current) drug therapy: Secondary | ICD-10-CM | POA: Insufficient documentation

## 2023-03-05 DIAGNOSIS — Z743 Need for continuous supervision: Secondary | ICD-10-CM | POA: Diagnosis not present

## 2023-03-05 LAB — CBC WITH DIFFERENTIAL/PLATELET
Abs Immature Granulocytes: 0.05 10*3/uL (ref 0.00–0.07)
Basophils Absolute: 0.1 10*3/uL (ref 0.0–0.1)
Basophils Relative: 0 %
Eosinophils Absolute: 0.1 10*3/uL (ref 0.0–0.5)
Eosinophils Relative: 0 %
HCT: 36.1 % (ref 36.0–46.0)
Hemoglobin: 12 g/dL (ref 12.0–15.0)
Immature Granulocytes: 0 %
Lymphocytes Relative: 13 %
Lymphs Abs: 1.8 10*3/uL (ref 0.7–4.0)
MCH: 30.3 pg (ref 26.0–34.0)
MCHC: 33.2 g/dL (ref 30.0–36.0)
MCV: 91.2 fL (ref 80.0–100.0)
Monocytes Absolute: 0.7 10*3/uL (ref 0.1–1.0)
Monocytes Relative: 5 %
Neutro Abs: 11.2 10*3/uL — ABNORMAL HIGH (ref 1.7–7.7)
Neutrophils Relative %: 82 %
Platelets: 233 10*3/uL (ref 150–400)
RBC: 3.96 MIL/uL (ref 3.87–5.11)
RDW: 13.6 % (ref 11.5–15.5)
WBC: 13.8 10*3/uL — ABNORMAL HIGH (ref 4.0–10.5)
nRBC: 0 % (ref 0.0–0.2)

## 2023-03-05 LAB — URINALYSIS, ROUTINE W REFLEX MICROSCOPIC
Bacteria, UA: NONE SEEN
Bilirubin Urine: NEGATIVE
Glucose, UA: NEGATIVE mg/dL
Ketones, ur: NEGATIVE mg/dL
Leukocytes,Ua: NEGATIVE
Nitrite: NEGATIVE
Protein, ur: >=300 mg/dL — AB
Specific Gravity, Urine: 1.017 (ref 1.005–1.030)
pH: 5 (ref 5.0–8.0)

## 2023-03-05 LAB — COMPREHENSIVE METABOLIC PANEL
ALT: 13 U/L (ref 0–44)
AST: 17 U/L (ref 15–41)
Albumin: 3.5 g/dL (ref 3.5–5.0)
Alkaline Phosphatase: 83 U/L (ref 38–126)
Anion gap: 10 (ref 5–15)
BUN: 31 mg/dL — ABNORMAL HIGH (ref 8–23)
CO2: 19 mmol/L — ABNORMAL LOW (ref 22–32)
Calcium: 9.5 mg/dL (ref 8.9–10.3)
Chloride: 107 mmol/L (ref 98–111)
Creatinine, Ser: 2.02 mg/dL — ABNORMAL HIGH (ref 0.44–1.00)
GFR, Estimated: 24 mL/min — ABNORMAL LOW (ref >60)
Glucose, Bld: 124 mg/dL — ABNORMAL HIGH (ref 70–99)
Potassium: 4.3 mmol/L (ref 3.5–5.1)
Sodium: 136 mmol/L (ref 135–145)
Total Bilirubin: 0.7 mg/dL (ref <1.2)
Total Protein: 7.4 g/dL (ref 6.5–8.1)

## 2023-03-05 LAB — BRAIN NATRIURETIC PEPTIDE: B Natriuretic Peptide: 261 pg/mL — ABNORMAL HIGH (ref 0.0–100.0)

## 2023-03-05 LAB — SARS CORONAVIRUS 2 BY RT PCR: SARS Coronavirus 2 by RT PCR: NEGATIVE

## 2023-03-05 LAB — LIPASE, BLOOD: Lipase: 25 U/L (ref 11–51)

## 2023-03-05 LAB — TROPONIN I (HIGH SENSITIVITY)
Troponin I (High Sensitivity): 10 ng/L (ref <18)
Troponin I (High Sensitivity): 11 ng/L (ref <18)

## 2023-03-05 MED ORDER — BENZONATATE 100 MG PO CAPS
100.0000 mg | ORAL_CAPSULE | Freq: Once | ORAL | Status: AC
  Administered 2023-03-05: 100 mg via ORAL
  Filled 2023-03-05: qty 1

## 2023-03-05 MED ORDER — SODIUM CHLORIDE 0.9 % IV BOLUS
500.0000 mL | Freq: Once | INTRAVENOUS | Status: AC
  Administered 2023-03-05: 500 mL via INTRAVENOUS

## 2023-03-05 MED ORDER — OXYCODONE-ACETAMINOPHEN 7.5-325 MG PO TABS
1.0000 | ORAL_TABLET | Freq: Once | ORAL | Status: AC
  Administered 2023-03-05: 1 via ORAL
  Filled 2023-03-05: qty 1

## 2023-03-05 MED ORDER — BENZONATATE 100 MG PO CAPS: 100.0000 mg | ORAL_CAPSULE | Freq: Three times a day (TID) | ORAL | 0 refills | Status: AC

## 2023-03-05 MED ORDER — ONDANSETRON HCL 4 MG/2ML IJ SOLN
4.0000 mg | Freq: Once | INTRAMUSCULAR | Status: AC
  Administered 2023-03-05: 4 mg via INTRAVENOUS
  Filled 2023-03-05: qty 2

## 2023-03-05 NOTE — ED Notes (Signed)
Contacted son and daughter in law to come pick up patient

## 2023-03-05 NOTE — ED Triage Notes (Signed)
BIB EMS pt states she has right sided abdominal pain and a c/o a cough last 2 days. Take BP meds. Not diabetic. No blood thinners.

## 2023-03-05 NOTE — ED Provider Notes (Signed)
[place] EMERGENCY DEPARTMENT AT [facility] Provider Note   CSN: [id number] Arrival date & time: 03/05/23  1034     History  Chief Complaint  Patient presents with   Abdominal Pain    [name] is a 87 y.o. female.  With a history of COPD, HFpEF, CKD and hypertension who presents to the ED for abdominal pain.  Patient has multiple complaints over the last few days including productive cough, upper abdominal pain and lower abdominal pain.  She also notes generalized weakness and malaise stating that she "just feels sick".  She has not had fevers chills or chest pain at home.  Last bowel movement was yesterday and was normal at that time   Abdominal Pain      Home Medications Prior to Admission medications   Medication Sig Start Date End Date Taking? Authorizing Provider  benzonatate (TESSALON) 100 MG capsule Take 1 capsule (100 mg total) by mouth every 8 (eight) hours for 4 days. 03/05/23 03/09/23 Yes Royanne Foots, DO  albuterol (VENTOLIN HFA) 108 (90 Base) MCG/ACT inhaler Inhale 1-2 puffs into the lungs every 6 (six) hours as needed for wheezing or shortness of breath. 12/28/20   Anabel Halon, MD  amLODipine (NORVASC) 5 MG tablet Take 5 mg by mouth daily. 11/24/22   [provider]  atorvastatin (LIPITOR) 20 MG tablet TAKE ONE TABLET BY MOUTH ONCE DAILY. 02/08/23   Mallipeddi, Vishnu P, MD  BREZTRI AEROSPHERE 160-9-4.8 MCG/ACT AERO INHALE 2 PUFFS TWICE DAILY. 11/07/22   Anabel Halon, MD  carvedilol (COREG) 6.25 MG tablet TAKE (1) TABLET BY MOUTH TWICE DAILY. Patient taking differently: Take 6.25 mg by mouth 2 (two) times daily with a meal. 04/24/22   Anabel Halon, MD  esomeprazole (NEXIUM) 40 MG capsule Take 40 mg by mouth 2 (two) times daily. 02/05/23   [provider]  furosemide (LASIX) 20 MG tablet Take 1 tablet (20 mg total) by mouth daily. 12/05/22 03/05/23  Mallipeddi, Vishnu P, MD  ipratropium-albuterol (DUONEB) 0.5-2.5 (3) MG/3ML  SOLN INHALE 1 VIAL VIA NEBULIZER EVERY (6) HOURS AS NEEDED. 01/05/23   Anabel Halon, MD  levothyroxine (SYNTHROID) 75 MCG tablet TAKE 1 TABLET BY MOUTH DAILY BEFORE BREAKFAST 01/17/23   Anabel Halon, MD  metoCLOPramide (REGLAN) 5 MG tablet Take 1 tablet (5 mg total) by mouth every 8 (eight) hours as needed for nausea. 01/29/23   Anabel Halon, MD  mirtazapine (REMERON) 7.5 MG tablet Take 1 tablet (7.5 mg total) by mouth at bedtime. 01/29/23   Anabel Halon, MD  oxyCODONE-acetaminophen (PERCOCET) 7.5-325 MG tablet Take 1 tablet by mouth 3 (three) times daily as needed. 06/29/22   [provider]  RABEprazole (ACIPHEX) 20 MG tablet Take 1 tablet (20 mg total) by mouth 2 (two) times daily before a meal. 01/16/23   Tiffany Kocher, PA-C      Allergies    Hydralazine, Nalbuphine, Paroxetine, Sulfa antibiotics, and Venlafaxine    Review of Systems   Review of Systems  Gastrointestinal:  Positive for abdominal pain.    Physical Exam Updated Vital Signs BP (!) 166/73   Pulse 65   Temp 98.5 F (36.9 C) (Oral)   Resp 17   Ht 5' (1.524 m)   Wt 49.9 kg   SpO2 93%   BMI 21.48 kg/m  Physical Exam Vitals and nursing note reviewed.  HENT:     Head: Normocephalic and atraumatic.  Eyes:  Pupils: Pupils are equal, round, and reactive to light.  Cardiovascular:     Rate and Rhythm: Normal rate and regular rhythm.  Pulmonary:     Effort: Pulmonary effort is normal.     Breath sounds: Normal breath sounds.  Abdominal:     Palpations: Abdomen is soft.     Tenderness: There is abdominal tenderness in the epigastric area and suprapubic area.  Skin:    General: Skin is warm and dry.  Neurological:     Mental Status: She is alert.  Psychiatric:        Mood and Affect: Mood normal.     ED Results / Procedures / Treatments   Labs (all labs ordered are listed, but only abnormal results are displayed) Labs Reviewed  COMPREHENSIVE METABOLIC PANEL - Abnormal; Notable for the  following components:      Result Value   CO2 19 (*)    Glucose, Bld 124 (*)    BUN 31 (*)    Creatinine, Ser 2.02 (*)    GFR, Estimated 24 (*)    All other components within normal limits  CBC WITH DIFFERENTIAL/PLATELET - Abnormal; Notable for the following components:   WBC 13.8 (*)    Neutro Abs 11.2 (*)    All other components within normal limits  [name] NATRIURETIC PEPTIDE - Abnormal; Notable for the following components:   B Natriuretic Peptide 261.0 (*)    All other components within normal limits  URINALYSIS, ROUTINE W REFLEX MICROSCOPIC - Abnormal; Notable for the following components:   Hgb urine dipstick SMALL (*)    Protein, ur >=300 (*)    All other components within normal limits  SARS CORONAVIRUS 2 BY RT PCR  LIPASE, BLOOD  TROPONIN I (HIGH SENSITIVITY)  TROPONIN I (HIGH SENSITIVITY)    EKG EKG Interpretation Date/Time:  Monday March 05 2023 11:25:57 EST Ventricular Rate:  69 PR Interval:  146 QRS Duration:  82 QT Interval:  398 QTC Calculation: 427 R Axis:   53  Text Interpretation: Sinus rhythm Atrial premature complex Abnormal R-wave progression, early transition Confirmed by Estelle June [phone]) on 03/05/2023 3:59:00 PM  Radiology CT ABDOMEN PELVIS WO CONTRAST  Result Date: 03/05/2023 CLINICAL DATA:  Abdominal pain, cough for 2 days, diarrhea EXAM: CT ABDOMEN AND PELVIS WITHOUT CONTRAST TECHNIQUE: Multidetector CT imaging of the abdomen and pelvis was performed following the standard protocol without IV contrast. RADIATION DOSE REDUCTION: This exam was performed according to the departmental dose-optimization program which includes automated exposure control, adjustment of the mA and/or kV according to patient size and/or use of iterative reconstruction technique. COMPARISON:  10/25/2022, 11/08/2022 FINDINGS: Lower chest: No acute pleural or parenchymal lung disease. Stable bibasilar scarring and bronchiectasis. Hepatobiliary: Cholecystectomy.  Unremarkable unenhanced appearance of the liver. Pancreas: Unremarkable unenhanced appearance. Spleen: Unremarkable unenhanced appearance. Adrenals/Urinary Tract: Stable simple bilateral renal cysts do not require specific imaging follow-up. No urinary tract calculi or obstructive uropathy within either kidney. The adrenals and bladder are unremarkable. Stomach/Bowel: No bowel obstruction or ileus. Normal appendix right lower quadrant. Stable diverticulosis of the distal colon with no evidence of acute diverticulitis. Postsurgical changes again noted at the gastroesophageal junction. Vascular/Lymphatic: Aortic atherosclerosis. No enlarged abdominal or pelvic lymph nodes. Reproductive: Status post hysterectomy. A small cystic lesion is seen within the right adnexa, decreased in size now measuring 1.7 cm. Left adnexa is unremarkable. Other: No free fluid or free intraperitoneal gas. No abdominal wall hernia. Musculoskeletal: No acute or destructive bony abnormalities. Reconstructed images demonstrate no  additional findings. IMPRESSION: 1. No acute intra-abdominal or intrapelvic process to explain the patient's symptoms. 2. Distal colonic diverticulosis without diverticulitis. 3. Right adnexal cyst, decreased in size now measuring 1.7 cm. Follow-up nonemergent pelvic ultrasound is recommended to document stability since previous exam 11/08/2022. 4.  Aortic Atherosclerosis (ICD10-I70.0). Electronically Signed   By: Sharlet Salina M.D.   On: 03/05/2023 15:09   DG Chest Portable 1 View  Result Date: 03/05/2023 CLINICAL DATA:  Cough and right abdominal pain for the past 2 days. Ex-smoker. History of asthma, COPD and hypertension. EXAM: PORTABLE CHEST 1 VIEW COMPARISON:  07/12/2022 FINDINGS: The cardiac silhouette remains mildly enlarged. The lungs remain hyperexpanded with coarse prominence of the interstitial markings without Kerley lines or pleural fluid. Diffuse osteopenia. Old, healed right rib fractures.  Thoracolumbar spine degenerative changes and mild scoliosis. IMPRESSION: 1. No acute abnormality. 2. COPD. 3. Mild cardiomegaly. Electronically Signed   By: Beckie Salts M.D.   On: 03/05/2023 12:20    Procedures Procedures    Medications Ordered in ED Medications  benzonatate (TESSALON) capsule 100 mg (has no administration in time range)  oxyCODONE-acetaminophen (PERCOCET) 7.5-325 MG per tablet 1 tablet (has no administration in time range)  ondansetron (ZOFRAN) injection 4 mg (4 mg Intravenous Given 03/05/23 1215)  sodium chloride 0.9 % bolus 500 mL (500 mLs Intravenous New Bag/Given 03/05/23 1538)    ED Course/ Medical Decision Making/ A&P Clinical Course as of 03/05/23 1611  Mon Mar 05, 2023  1559 Slight leukocytosis of 13.8.  No significant metabolic abnormalities on CMP.  High-sensitivity troponin of 11 not consistent with ACS.  No ischemic changes on EKG.  CT abdomen pelvis shows diverticulosis but no acute diverticulitis or other acute intra-abdominal process.  COVID is negative.  Chest x-ray shows no acute disease.  UA shows no evidence of UTI.  Unclear cause of patient's presentation.  She does report feeling better after IV fluids  which may point to mild dehydration.  No indication for admission at this time.  Her main request now is something to help with her dry cough at home.  Will trial Tessalon Perles.  Give her a few to go home with and pick up from her pharmacy.  Discussed with the patient who is amenable with plan for discharge with close PCP follow-up [MP]    Clinical Course User Index [MP] Royanne Foots, DO                                 Medical Decision Making 87 year old female with history as above presenting for complaints including coughing, epigastric pain along with lower abdominal pain.  Symptoms present for 2 days.  Afebrile and slightly hypertensive here.  Well-appearing on exam.  Exam notable for some epigastric as well as suprapubic tenderness.   Considering her history and presentation today broad differential diagnosis including ACS, pneumonia, intra-abdominal infection and obstruction.  Will obtain laboratory workup including high-sensitivity troponin, BNP with her history of heart failure, chest x-ray, EKG and CT abdomen pelvis  Amount and/or Complexity of Data Reviewed Labs: ordered. Radiology: ordered.  Risk Prescription drug management.           Final Clinical Impression(s) / ED Diagnoses Final diagnoses:  Abdominal pain, unspecified abdominal location  Cough, unspecified type  Generalized weakness    Rx / DC Orders ED Discharge Orders          Ordered    benzonatate (TESSALON)  100 MG capsule  Every 8 hours        03/05/23 1611              Royanne Foots, DO 03/05/23 1612

## 2023-03-05 NOTE — Discharge Instructions (Signed)
You were seen in the emergency department for coughing, weakness and abdominal pain The CAT scan did not show any infection in your abdomen Chest x-ray did not show pneumonia Your blood work looked okay and we watched you for several hours here We are not certain what is causing your coughing or discomfort You can take Tessalon Perles at home to help with your dry cough Continue taking all previously prescribed indications Return to the emergency department for chest pain, trouble breathing or any other concerns Otherwise please follow-up with your primary care doctor in 1 week for reevaluation

## 2023-03-06 ENCOUNTER — Telehealth: Payer: Self-pay

## 2023-03-06 NOTE — Transitions of Care (Post Inpatient/ED Visit) (Signed)
 03/06/2023  Name: [name] MRN: [id number] DOB: 1935/07/11  Today's TOC FU Call Status: Today's TOC FU Call Status:: Successful TOC FU Call Completed TOC FU Call Complete Date: 03/06/23 Patient's Name and Date of Birth confirmed.  Transition Care Management Follow-up Telephone Call Date of Discharge: 03/05/23 [facility] Type of Discharge: Emergency Department Reason for ED Visit: Other: (abdominal pain unspecified, cough) How have you been since you were released from the hospital?: Same (cough isn't any better) Any questions or concerns?: No  Items Reviewed: Did you receive and understand the discharge instructions provided?: Yes Medications obtained,verified, and reconciled?: Yes (Medications Reviewed) Any new allergies since your discharge?: No Dietary orders reviewed?: NA Do you have support at home?: Yes  Medications Reviewed Today: Medications Reviewed Today     Reviewed by Terrilee Dudzik, Jordan Hawks, CMA (Certified Medical Assistant) on 03/06/23 at 1003  Med List Status: <None>   Medication Order Taking? Sig Documenting Provider Last Dose Status Informant  albuterol (VENTOLIN HFA) 108 (90 Base) MCG/ACT inhaler [id number] No Inhale 1-2 puffs into the lungs every 6 (six) hours as needed for wheezing or shortness of breath. Anabel Halon, MD Taking Active Self, Pharmacy Records, Child  amLODipine [facility]) 5 MG tablet [id number] No Take 5 mg by mouth daily. [provider] Taking Active   atorvastatin (LIPITOR) 20 MG tablet [id number]  TAKE ONE TABLET BY MOUTH ONCE DAILY. Mallipeddi, Vishnu P, MD  Active   benzonatate (TESSALON) 100 MG capsule [id number]  Take 1 capsule (100 mg total) by mouth every 8 (eight) hours for 4 days. Royanne Foots, DO  Active   BREZTRI AEROSPHERE 160-9-4.8 MCG/ACT AERO [id number] No INHALE 2 PUFFS TWICE DAILY. Anabel Halon, MD Taking Active   carvedilol (COREG) 6.25 MG tablet [id number] No TAKE (1) TABLET BY MOUTH  TWICE DAILY.  Patient taking differently: Take 6.25 mg by mouth 2 (two) times daily with a meal.   Anabel Halon, MD Taking Active Self, Pharmacy Records, Child  esomeprazole (NEXIUM) 40 MG capsule [id number] No Take 40 mg by mouth 2 (two) times daily. [provider] Taking Active   furosemide (LASIX) 20 MG tablet [id number] No Take 1 tablet (20 mg total) by mouth daily. Mallipeddi, Vishnu P, MD Taking Expired 03/05/23 2359   ipratropium-albuterol (DUONEB) 0.5-2.5 (3) MG/3ML SOLN [id number] No INHALE 1 VIAL VIA NEBULIZER EVERY (6) HOURS AS NEEDED. Anabel Halon, MD Taking Active   levothyroxine (SYNTHROID) 75 MCG tablet [id number] No TAKE 1 TABLET BY MOUTH DAILY BEFORE BREAKFAST Anabel Halon, MD Taking Active   metoCLOPramide (REGLAN) 5 MG tablet [id number] No Take 1 tablet (5 mg total) by mouth every 8 (eight) hours as needed for nausea. Anabel Halon, MD Taking Active   mirtazapine (REMERON) 7.5 MG tablet [id number] No Take 1 tablet (7.5 mg total) by mouth at bedtime. Anabel Halon, MD Taking Active   oxyCODONE-acetaminophen (PERCOCET) 7.5-325 MG tablet [id number] No Take 1 tablet by mouth 3 (three) times daily as needed. [provider] Taking Active Self, Pharmacy Records, Child  RABEprazole (ACIPHEX) 20 MG tablet [id number] No Take 1 tablet (20 mg total) by mouth 2 (two) times daily before a meal. Tiffany Kocher, PA-C Taking Active             Home Care and Equipment/Supplies: Were Home Health Services Ordered?: NA Any new equipment or medical supplies ordered?: NA  Functional Questionnaire: Do you need assistance with bathing/showering or dressing?: Yes Do you  need assistance with meal preparation?: Yes Do you need assistance with eating?: No Do you have difficulty maintaining continence: No Do you need assistance with getting out of bed/getting out of a chair/moving?: Yes Do you have difficulty managing or taking your medications?: No (right now needs a  little help due to being sick)  Follow up appointments reviewed: PCP Follow-up appointment confirmed?: Yes Date of PCP follow-up appointment?: 03/08/23 Follow-up Provider: Gilmore [facility] Specialist [facility] Follow-up appointment confirmed?: NA Do you need transportation to your follow-up appointment?: No Do you understand care options if your condition(s) worsen?: Yes-patient verbalized understanding     Ryken Paschal, CMA  [facility] AWV Team Direct Dial: [phone]

## 2023-03-07 NOTE — Progress Notes (Unsigned)
Established Patient Office Visit  Subjective:  Patient ID: [name], female    DOB: 05-Dec-1935  Age: 87 y.o. MRN: [id number]  CC: No chief complaint on file.   HPI [name] is a 87 y.o. female with past medical history of *** presents for f/u of *** chronic medical conditions.  Abdominal pain:CT abdomen pelvis shows diverticulosis but no acute diverticulitis or other acute intra-abdominal process. COVID is negative. Chest x-ray shows no acute disease. UA shows no evidence of UTI. Unclear cause of patient's presentation. Slight leukocytosis of 13.8. No significant metabolic abnormalities on CMP. High-sensitivity troponin of 11 not consistent with ACS.  She reported feeling better after IV fluids administration Past Medical History:  Diagnosis Date   Anxiety disorder, unspecified    Arteriosclerotic cardiovascular disease (ASCVD) 2006   Nonobstructive; < 50% lesions on cath 2002; negative stress nuclear study in 10/2004   Asthma    Chest pain, unspecified    Chronic kidney disease, unspecified    Chronic obstructive pulmonary disease, unspecified (HCC)    Chronic pelvic pain in female    Colitis due to Clostridium difficile 1994   1994   COPD (chronic obstructive pulmonary disease) (HCC)    Depression with anxiety    Diarrhea, unspecified    Disorder of thyroid, unspecified    Gastro-esophageal reflux disease without esophagitis    Gastroesophageal reflux disease    Hyperlipidemia    Hyperlipidemia, unspecified    Hypertension    Hypertensive chronic kidney disease with stage 1 through stage 4 chronic kidney disease, or unspecified chronic kidney disease    Hyperthyroidism    Hypothyroidism    Hypothyroidism, unspecified    Kidney disease, chronic, stage III (moderate, EGFR 30-59 ml/min) (HCC)    Lower abdominal pain, unspecified    Major depressive disorder, single episode, severe without psychotic features (HCC)    Major depressive disorder, single episode,  unspecified    Nausea    Other postherpetic nervous system involvement    Pelvic and perineal pain    Rash and other nonspecific skin eruption    S/P colonoscopy 2007   few diverticula, otherwise nl   S/P colonoscopy 12/13/2010   rectal, cecal polyp, left-sided diverticulosis, hyperplastic. ?anal fissure? treated empirically   S/P endoscopy 2009   linear esophageal erosions   S/P endoscopy 05/10/2010   Question island of salmon-colored epithelium in distal esophagus ; no Barrett's.    Shingles    Tobacco abuse, in remission    55-pack-year consumption; discontinued 08/2010   Unspecified asthma, uncomplicated    Unspecified osteoarthritis, unspecified site    Urinary tract infection, site not specified    Uterine cancer [facility])     Past Surgical History:  Procedure Laterality Date   ABDOMINAL HYSTERECTOMY     BIOPSY  12/06/2022   Procedure: BIOPSY;  Surgeon: Corbin Ade, MD;  Location: [facility];  Service: Endoscopy;;   BREAST LUMPECTOMY     CATARACT EXTRACTION     CHOLECYSTECTOMY  1962   COLONOSCOPY  12/13/2010   Left-sided diverticulosis.  Cecal polyp, status post hot snare polypectomy/ Diminutive rectal polyp, status post cold biopsy removal tender/painful anal canal, ? occult anal fissure- Not visualized   COLONOSCOPY N/A 10/14/2015   Diverticulosis   ESOPHAGOGASTRODUODENOSCOPY  05/10/10   benign mucosa with mild chronic inflammation.   ESOPHAGOGASTRODUODENOSCOPY (EGD) WITH PROPOFOL N/A 02/13/2019   Procedure: ESOPHAGOGASTRODUODENOSCOPY (EGD) WITH PROPOFOL;  Surgeon: Corbin Ade, MD;  Location: [facility];  Service: Endoscopy;  Laterality: N/A;  3:00pm   ESOPHAGOGASTRODUODENOSCOPY (EGD) WITH PROPOFOL N/A 12/06/2022   Procedure: ESOPHAGOGASTRODUODENOSCOPY (EGD) WITH PROPOFOL;  Surgeon: Corbin Ade, MD;  Location: [facility];  Service: Endoscopy;  Laterality: N/A;  2:30 PM, ASA 3/4   FLEXIBLE SIGMOIDOSCOPY  12/29/2011   Procedure: FLEXIBLE SIGMOIDOSCOPY;   Surgeon: Malissa Hippo, MD;  Location: [facility];  Service: Endoscopy;  Laterality: N/A;  200-Ann notified pt to be here @ 1:00   NISSEN FUNDOPLICATION     YAG LASER APPLICATION Left 05/25/2014   Procedure: YAG LASER APPLICATION;  Surgeon: Susa Simmonds, MD;  Location: [facility];  Service: Ophthalmology;  Laterality: Left;    Family History  Problem Relation Age of Onset   Heart disease Father    Diabetes Mother    Alzheimer's disease Mother    Cancer Mother    Heart disease Brother        Also mother, Father, brother and 2 sons   Hypertension Brother        also Sister x2   Cancer Sister    Cancer Son        testicular cancer    Aneurysm Son    Diabetes Son    Colon cancer Neg Hx     Social History   Socioeconomic History   Marital status: Married    Spouse name: Not on file   Number of children: 4   Years of education: Not on file   Highest education level: Not on file  Occupational History   Not on file  Tobacco Use   Smoking status: Former    Current packs/day: 0.00    Average packs/day: 1 pack/day for 55.0 years (55.0 ttl pk-yrs)    Types: Cigarettes    Start date: 09/15/1955    Quit date: 09/15/2010    Years since quitting: 12.4   Smokeless tobacco: Never  Vaping Use   Vaping status: Never Used  Substance and Sexual Activity   Alcohol use: No    Alcohol/week: 0.0 standard drinks of alcohol   Drug use: No   Sexual activity: Not Currently    Birth control/protection: Surgical    Comment: hyst  Other Topics Concern   Not on file  Social History Narrative   Not on file   Social Determinants of Health   Financial Resource Strain: High Risk (02/07/2023)   Overall Financial Resource Strain (CARDIA)    Difficulty of Paying Living Expenses: Very hard  Food Insecurity: No Food Insecurity (02/07/2023)   Hunger Vital Sign    Worried About Running Out of Food in the Last Year: Never true    Ran Out of Food in the Last Year: Never true  Transportation  Needs: No Transportation Needs (02/07/2023)   PRAPARE - Administrator, Civil Service (Medical): No    Lack of Transportation (Non-Medical): No  Physical Activity: Inactive (02/07/2023)   Exercise Vital Sign    Days of Exercise per Week: 0 days    Minutes of Exercise per Session: 10 min  Stress: No Stress Concern Present (02/07/2023)   [employer] of Occupational Health - Occupational Stress Questionnaire    Feeling of Stress : Not at all  Social Connections: Moderately Isolated (02/07/2023)   Social Connection and Isolation Panel [NHANES]    Frequency of Communication with Friends and Family: Twice a week    Frequency of Social Gatherings with Friends and Family: Once a week    Attends Religious Services: Never  Active Member of Clubs or Organizations: No    Attends Banker Meetings: Never    Marital Status: Married  Catering manager Violence: Not At Risk (02/07/2023)   Humiliation, Afraid, Rape, and Kick questionnaire    Fear of Current or Ex-Partner: No    Emotionally Abused: No    Physically Abused: No    Sexually Abused: No    Outpatient Medications Prior to Visit  Medication Sig Dispense Refill   albuterol (VENTOLIN HFA) 108 (90 Base) MCG/ACT inhaler Inhale 1-2 puffs into the lungs every 6 (six) hours as needed for wheezing or shortness of breath. 8 g 5   amLODipine (NORVASC) 5 MG tablet Take 5 mg by mouth daily.     atorvastatin (LIPITOR) 20 MG tablet TAKE ONE TABLET BY MOUTH ONCE DAILY. 90 tablet 0   benzonatate (TESSALON) 100 MG capsule Take 1 capsule (100 mg total) by mouth every 8 (eight) hours for 4 days. 12 capsule 0   BREZTRI AEROSPHERE 160-9-4.8 MCG/ACT AERO INHALE 2 PUFFS TWICE DAILY. 10.7 g 0   carvedilol (COREG) 6.25 MG tablet TAKE (1) TABLET BY MOUTH TWICE DAILY. (Patient taking differently: Take 6.25 mg by mouth 2 (two) times daily with a meal.) 180 tablet 0   esomeprazole (NEXIUM) 40 MG capsule Take 40 mg by mouth 2 (two) times  daily.     furosemide (LASIX) 20 MG tablet Take 1 tablet (20 mg total) by mouth daily. 90 tablet 3   ipratropium-albuterol (DUONEB) 0.5-2.5 (3) MG/3ML SOLN INHALE 1 VIAL VIA NEBULIZER EVERY (6) HOURS AS NEEDED. 360 mL 2   levothyroxine (SYNTHROID) 75 MCG tablet TAKE 1 TABLET BY MOUTH DAILY BEFORE BREAKFAST 90 tablet 1   metoCLOPramide (REGLAN) 5 MG tablet Take 1 tablet (5 mg total) by mouth every 8 (eight) hours as needed for nausea. 60 tablet 2   mirtazapine (REMERON) 7.5 MG tablet Take 1 tablet (7.5 mg total) by mouth at bedtime. 30 tablet 5   oxyCODONE-acetaminophen (PERCOCET) 7.5-325 MG tablet Take 1 tablet by mouth 3 (three) times daily as needed.     RABEprazole (ACIPHEX) 20 MG tablet Take 1 tablet (20 mg total) by mouth 2 (two) times daily before a meal. 180 tablet 3   No facility-administered medications prior to visit.    Allergies  Allergen Reactions   Hydralazine Other (See Comments)    Breathing   Nalbuphine Rash   Paroxetine Itching   Sulfa Antibiotics Other (See Comments)    Childhood reaction.   Venlafaxine Itching    ROS Review of Systems    Objective:    Physical Exam  There were no vitals taken for this visit. Wt Readings from Last 3 Encounters:  03/05/23 110 lb (49.9 kg)  02/07/23 114 lb 3.2 oz (51.8 kg)  01/16/23 109 lb 12.8 oz (49.8 kg)    Lab Results  Component Value Date   TSH 4.220 02/07/2023   Lab Results  Component Value Date   WBC 13.8 (H) 03/05/2023   HGB 12.0 03/05/2023   HCT 36.1 03/05/2023   MCV 91.2 03/05/2023   PLT 233 03/05/2023   Lab Results  Component Value Date   NA 136 03/05/2023   K 4.3 03/05/2023   CO2 19 (L) 03/05/2023   GLUCOSE 124 (H) 03/05/2023   BUN 31 (H) 03/05/2023   CREATININE 2.02 (H) 03/05/2023   BILITOT 0.7 03/05/2023   ALKPHOS 83 03/05/2023   AST 17 03/05/2023   ALT 13 03/05/2023   PROT 7.4 03/05/2023  ALBUMIN 3.5 03/05/2023   CALCIUM 9.5 03/05/2023   ANIONGAP 10 03/05/2023   EGFR 19.0 10/31/2022    Lab Results  Component Value Date   CHOL 148 02/07/2023   Lab Results  Component Value Date   HDL 58 02/07/2023   Lab Results  Component Value Date   LDLCALC 75 02/07/2023   Lab Results  Component Value Date   TRIG 78 02/07/2023   Lab Results  Component Value Date   CHOLHDL 2.6 02/07/2023   Lab Results  Component Value Date   HGBA1C 5.5 02/07/2023      Assessment & Plan:  There are no diagnoses linked to this encounter.  Follow-up: No follow-ups on file.   Gilmore Laroche, FNP

## 2023-03-08 ENCOUNTER — Other Ambulatory Visit: Payer: Self-pay

## 2023-03-08 ENCOUNTER — Ambulatory Visit: Payer: 59 | Admitting: Family Medicine

## 2023-03-08 ENCOUNTER — Encounter (HOSPITAL_COMMUNITY): Payer: Self-pay

## 2023-03-08 ENCOUNTER — Emergency Department (HOSPITAL_COMMUNITY)
Admission: EM | Admit: 2023-03-08 | Discharge: 2023-03-08 | Disposition: A | Discharge status: Home / Self Care | Payer: 59 | Attending: Emergency Medicine | Admitting: Emergency Medicine

## 2023-03-08 ENCOUNTER — Emergency Department (HOSPITAL_COMMUNITY): Payer: 59

## 2023-03-08 DIAGNOSIS — Z1152 Encounter for screening for COVID-19: Secondary | ICD-10-CM | POA: Diagnosis not present

## 2023-03-08 DIAGNOSIS — R109 Unspecified abdominal pain: Secondary | ICD-10-CM | POA: Diagnosis not present

## 2023-03-08 DIAGNOSIS — F0390 Unspecified dementia without behavioral disturbance: Secondary | ICD-10-CM | POA: Insufficient documentation

## 2023-03-08 DIAGNOSIS — I509 Heart failure, unspecified: Secondary | ICD-10-CM | POA: Diagnosis not present

## 2023-03-08 DIAGNOSIS — Z79899 Other long term (current) drug therapy: Secondary | ICD-10-CM | POA: Insufficient documentation

## 2023-03-08 DIAGNOSIS — R531 Weakness: Secondary | ICD-10-CM | POA: Insufficient documentation

## 2023-03-08 DIAGNOSIS — R5381 Other malaise: Secondary | ICD-10-CM | POA: Diagnosis not present

## 2023-03-08 DIAGNOSIS — Z743 Need for continuous supervision: Secondary | ICD-10-CM | POA: Diagnosis not present

## 2023-03-08 DIAGNOSIS — N189 Chronic kidney disease, unspecified: Secondary | ICD-10-CM | POA: Diagnosis not present

## 2023-03-08 DIAGNOSIS — J984 Other disorders of lung: Secondary | ICD-10-CM | POA: Diagnosis not present

## 2023-03-08 DIAGNOSIS — R509 Fever, unspecified: Secondary | ICD-10-CM | POA: Insufficient documentation

## 2023-03-08 DIAGNOSIS — R059 Cough, unspecified: Secondary | ICD-10-CM | POA: Diagnosis not present

## 2023-03-08 DIAGNOSIS — R0689 Other abnormalities of breathing: Secondary | ICD-10-CM | POA: Diagnosis not present

## 2023-03-08 DIAGNOSIS — J449 Chronic obstructive pulmonary disease, unspecified: Secondary | ICD-10-CM | POA: Diagnosis not present

## 2023-03-08 DIAGNOSIS — I10 Essential (primary) hypertension: Secondary | ICD-10-CM | POA: Diagnosis not present

## 2023-03-08 DIAGNOSIS — E860 Dehydration: Secondary | ICD-10-CM | POA: Diagnosis not present

## 2023-03-08 DIAGNOSIS — R1084 Generalized abdominal pain: Secondary | ICD-10-CM | POA: Diagnosis not present

## 2023-03-08 LAB — CBC
HCT: 33.6 % — ABNORMAL LOW (ref 36.0–46.0)
Hemoglobin: 10.9 g/dL — ABNORMAL LOW (ref 12.0–15.0)
MCH: 29.6 pg (ref 26.0–34.0)
MCHC: 32.4 g/dL (ref 30.0–36.0)
MCV: 91.3 fL (ref 80.0–100.0)
Platelets: 226 10*3/uL (ref 150–400)
RBC: 3.68 MIL/uL — ABNORMAL LOW (ref 3.87–5.11)
RDW: 13.1 % (ref 11.5–15.5)
WBC: 12.6 10*3/uL — ABNORMAL HIGH (ref 4.0–10.5)
nRBC: 0 % (ref 0.0–0.2)

## 2023-03-08 LAB — RESP PANEL BY RT-PCR (RSV, FLU A&B, COVID)  RVPGX2
Influenza A by PCR: NEGATIVE
Influenza B by PCR: NEGATIVE
Resp Syncytial Virus by PCR: NEGATIVE
SARS Coronavirus 2 by RT PCR: NEGATIVE

## 2023-03-08 LAB — URINALYSIS, ROUTINE W REFLEX MICROSCOPIC
Bacteria, UA: NONE SEEN
Bilirubin Urine: NEGATIVE
Glucose, UA: NEGATIVE mg/dL
Ketones, ur: 5 mg/dL — AB
Leukocytes,Ua: NEGATIVE
Nitrite: NEGATIVE
Protein, ur: >=300 mg/dL — AB
Specific Gravity, Urine: 1.013 (ref 1.005–1.030)
pH: 5 (ref 5.0–8.0)

## 2023-03-08 LAB — CBG MONITORING, ED: Glucose-Capillary: 100 mg/dL — ABNORMAL HIGH (ref 70–99)

## 2023-03-08 LAB — BASIC METABOLIC PANEL
Anion gap: 14 (ref 5–15)
BUN: 40 mg/dL — ABNORMAL HIGH (ref 8–23)
CO2: 15 mmol/L — ABNORMAL LOW (ref 22–32)
Calcium: 8.6 mg/dL — ABNORMAL LOW (ref 8.9–10.3)
Chloride: 103 mmol/L (ref 98–111)
Creatinine, Ser: 2.34 mg/dL — ABNORMAL HIGH (ref 0.44–1.00)
GFR, Estimated: 20 mL/min — ABNORMAL LOW (ref >60)
Glucose, Bld: 107 mg/dL — ABNORMAL HIGH (ref 70–99)
Potassium: 4.1 mmol/L (ref 3.5–5.1)
Sodium: 132 mmol/L — ABNORMAL LOW (ref 135–145)

## 2023-03-08 LAB — BRAIN NATRIURETIC PEPTIDE: B Natriuretic Peptide: 228 pg/mL — ABNORMAL HIGH (ref 0.0–100.0)

## 2023-03-08 LAB — TROPONIN I (HIGH SENSITIVITY)
Troponin I (High Sensitivity): 23 ng/L — ABNORMAL HIGH (ref <18)
Troponin I (High Sensitivity): 25 ng/L — ABNORMAL HIGH (ref <18)

## 2023-03-08 MED ORDER — SODIUM CHLORIDE 0.9 % IV BOLUS
500.0000 mL | Freq: Once | INTRAVENOUS | Status: AC
  Administered 2023-03-08: 500 mL via INTRAVENOUS

## 2023-03-08 MED ORDER — ONDANSETRON HCL 4 MG/2ML IJ SOLN
4.0000 mg | Freq: Once | INTRAMUSCULAR | Status: AC
  Administered 2023-03-08: 4 mg via INTRAVENOUS
  Filled 2023-03-08: qty 2

## 2023-03-08 MED ORDER — OXYCODONE-ACETAMINOPHEN 5-325 MG PO TABS
1.0000 | ORAL_TABLET | Freq: Once | ORAL | Status: AC
  Administered 2023-03-08: 1 via ORAL
  Filled 2023-03-08: qty 1

## 2023-03-08 MED ORDER — BENZONATATE 100 MG PO CAPS
200.0000 mg | ORAL_CAPSULE | Freq: Once | ORAL | Status: AC
  Administered 2023-03-08: 200 mg via ORAL
  Filled 2023-03-08: qty 2

## 2023-03-08 NOTE — ED Provider Notes (Signed)
[facility] Provider Note   CSN: [id number] Arrival date & time: 03/08/23  4098     History  Chief Complaint  Patient presents with   Weakness    [name] is a 87 y.o. female.  She has a history of COPD, heart failure, CKD, dementia, mitral regurg.  She was seen 2 days ago for cough abdominal pain weakness and feeling "sick."  She said they did not find anything and sent her home but she does not feel any better.  She wants to know why they did give her an antibiotic.  She said she is weak and sick.  Usually uses a walker to ambulate.  Lives with her son and daughter-in-law.  The history is provided by the patient.  Weakness Severity:  Severe Onset quality:  Gradual Timing:  Constant Progression:  Worsening Relieved by:  Nothing Worsened by:  Activity Ineffective treatments:  Rest Associated symptoms: abdominal pain, cough, difficulty walking, fever, nausea and shortness of breath   Associated symptoms: no chest pain, no dysuria and no vomiting        Home Medications Prior to Admission medications   Medication Sig Start Date End Date Taking? Authorizing Provider  albuterol (VENTOLIN HFA) 108 (90 Base) MCG/ACT inhaler Inhale 1-2 puffs into the lungs every 6 (six) hours as needed for wheezing or shortness of breath. 12/28/20   Anabel Halon, MD  amLODipine (NORVASC) 5 MG tablet Take 5 mg by mouth daily. 11/24/22   [provider]  atorvastatin (LIPITOR) 20 MG tablet TAKE ONE TABLET BY MOUTH ONCE DAILY. 02/08/23   Mallipeddi, Vishnu P, MD  benzonatate (TESSALON) 100 MG capsule Take 1 capsule (100 mg total) by mouth every 8 (eight) hours for 4 days. 03/05/23 03/09/23  Royanne Foots, DO  BREZTRI AEROSPHERE 160-9-4.8 MCG/ACT AERO INHALE 2 PUFFS TWICE DAILY. 11/07/22   Anabel Halon, MD  carvedilol (COREG) 6.25 MG tablet TAKE (1) TABLET BY MOUTH TWICE DAILY. Patient taking differently: Take 6.25 mg by mouth 2 (two)  times daily with a meal. 04/24/22   Anabel Halon, MD  esomeprazole (NEXIUM) 40 MG capsule Take 40 mg by mouth 2 (two) times daily. 02/05/23   [provider]  furosemide (LASIX) 20 MG tablet Take 1 tablet (20 mg total) by mouth daily. 12/05/22 03/05/23  Mallipeddi, Vishnu P, MD  ipratropium-albuterol (DUONEB) 0.5-2.5 (3) MG/3ML SOLN INHALE 1 VIAL VIA NEBULIZER EVERY (6) HOURS AS NEEDED. 01/05/23   Anabel Halon, MD  levothyroxine (SYNTHROID) 75 MCG tablet TAKE 1 TABLET BY MOUTH DAILY BEFORE BREAKFAST 01/17/23   Anabel Halon, MD  metoCLOPramide (REGLAN) 5 MG tablet Take 1 tablet (5 mg total) by mouth every 8 (eight) hours as needed for nausea. 01/29/23   Anabel Halon, MD  mirtazapine (REMERON) 7.5 MG tablet Take 1 tablet (7.5 mg total) by mouth at bedtime. 01/29/23   Anabel Halon, MD  oxyCODONE-acetaminophen (PERCOCET) 7.5-325 MG tablet Take 1 tablet by mouth 3 (three) times daily as needed. 06/29/22   [provider]  RABEprazole (ACIPHEX) 20 MG tablet Take 1 tablet (20 mg total) by mouth 2 (two) times daily before a meal. 01/16/23   Tiffany Kocher, PA-C      Allergies    Hydralazine, Nalbuphine, Paroxetine, Sulfa antibiotics, and Venlafaxine    Review of Systems   Review of Systems  Constitutional:  Positive for fever.  Respiratory:  Positive for cough and shortness of breath.  Cardiovascular:  Negative for chest pain.  Gastrointestinal:  Positive for abdominal pain and nausea. Negative for vomiting.  Genitourinary:  Negative for dysuria.  Neurological:  Positive for weakness.    Physical Exam Updated Vital Signs BP (!) 147/72   Pulse 80   Temp 98.8 F (37.1 C) (Oral)   Resp 19   Ht 5' (1.524 m)   Wt 49.5 kg   SpO2 93%   BMI 21.30 kg/m  Physical Exam Vitals and nursing note reviewed.  Constitutional:      General: She is not in acute distress.    Appearance: Normal appearance. She is well-developed.  HENT:     Head: Normocephalic and atraumatic.   Eyes:     Conjunctiva/sclera: Conjunctivae normal.  Cardiovascular:     Rate and Rhythm: Normal rate and regular rhythm.     Heart sounds: No murmur heard. Pulmonary:     Effort: Pulmonary effort is normal. No respiratory distress.     Breath sounds: Wheezing and rhonchi present.  Abdominal:     Palpations: Abdomen is soft.     Tenderness: There is no abdominal tenderness. There is no guarding or rebound.  Musculoskeletal:        General: Normal range of motion.     Cervical back: Neck supple.     Right lower leg: No edema.     Left lower leg: No edema.  Skin:    General: Skin is warm and dry.     Capillary Refill: Capillary refill takes less than 2 seconds.  Neurological:     General: No focal deficit present.     Mental Status: She is alert.     ED Results / Procedures / Treatments   Labs (all labs ordered are listed, but only abnormal results are displayed) Labs Reviewed  BASIC METABOLIC PANEL - Abnormal; Notable for the following components:      Result Value   Sodium 132 (*)    CO2 15 (*)    Glucose, Bld 107 (*)    BUN 40 (*)    Creatinine, Ser 2.34 (*)    Calcium 8.6 (*)    GFR, Estimated 20 (*)    All other components within normal limits  CBC - Abnormal; Notable for the following components:   WBC 12.6 (*)    RBC 3.68 (*)    Hemoglobin 10.9 (*)    HCT 33.6 (*)    All other components within normal limits  URINALYSIS, ROUTINE W REFLEX MICROSCOPIC - Abnormal; Notable for the following components:   APPearance HAZY (*)    Hgb urine dipstick SMALL (*)    Ketones, ur 5 (*)    Protein, ur >=300 (*)    All other components within normal limits  BRAIN NATRIURETIC PEPTIDE - Abnormal; Notable for the following components:   B Natriuretic Peptide 228.0 (*)    All other components within normal limits  CBG MONITORING, ED - Abnormal; Notable for the following components:   Glucose-Capillary 100 (*)    All other components within normal limits  TROPONIN I (HIGH  SENSITIVITY) - Abnormal; Notable for the following components:   Troponin I (High Sensitivity) 23 (*)    All other components within normal limits  TROPONIN I (HIGH SENSITIVITY) - Abnormal; Notable for the following components:   Troponin I (High Sensitivity) 25 (*)    All other components within normal limits  RESP PANEL BY RT-PCR (RSV, FLU A&B, COVID)  [id number]    EKG EKG Interpretation Date/Time:  Thursday March 08 2023 17:49:27 EST Ventricular Rate:  85 PR Interval:  151 QRS Duration:  86 QT Interval:  377 QTC Calculation: 449 R Axis:   71  Text Interpretation: Sinus rhythm Confirmed by Kommor, Madison ([id number]) on 03/09/2023 10:53:22 AM  Radiology DG Chest Port 1 View Result Date: 03/08/2023 CLINICAL DATA:  Cough. EXAM: PORTABLE CHEST 1 VIEW COMPARISON:  03/05/2023. FINDINGS: Bilateral lungs appear hyperlucent with coarse bronchovascular markings, in keeping with COPD. There are nonspecific linear opacities throughout bilateral lungs with asymmetric more involvement of the right upper mid lung zones, which is similar to prior study and may represent underlying fibrosis/scarring. Bilateral lungs otherwise appear clear. No acute consolidation or lung collapse. Bilateral costophrenic angles are clear. Stable cardio-mediastinal silhouette. No acute osseous abnormalities. Redemonstration of several old healed right lateral rib fractures. The soft tissues are within normal limits. IMPRESSION: *Stable chronic lung parenchymal disease. No acute consolidation or lung collapse. No pleural effusion. Electronically Signed   By: Jules Schick M.D.   On: 03/08/2023 10:53    Procedures Procedures    Medications Ordered in ED Medications  oxyCODONE-acetaminophen (PERCOCET/ROXICET) 5-325 MG per tablet 1 tablet (1 tablet Oral Given 03/08/23 1138)  sodium chloride 0.9 % bolus 500 mL (0 mLs Intravenous Stopped 03/08/23 1424)  benzonatate (TESSALON) capsule 200 mg (200 mg Oral Given 03/08/23 1601)   ondansetron (ZOFRAN) injection 4 mg (4 mg Intravenous Given 03/08/23 1601)    ED Course/ Medical Decision Making/ A&P Clinical Course as of 03/09/23 1745  Thu Mar 08, 2023  1027 EKG not crossing in epic.  Normal sinus rhythm no acute ST-T changes.  Similar to prior EKG from 3 days ago. [MB]  1046 Chest x-ray interpreted by me as no acute infiltrate, does have some chronic interstitial changes.  Awaiting radiology reading. [MB]  1622 UA neg for acute infection  [SG]  1747 Creatinine(!): 2.34 Similar to prior  [SG]  1747 Troponin I (High Sensitivity)(!): 25 Flat, no cp, EKG w/o ischemic changes  [SG]    Clinical Course User Index [MB] Terrilee Files, MD [SG] Sloan Leiter, DO                                 Medical Decision Making Amount and/or Complexity of Data Reviewed Labs: ordered. Decision-making details documented in ED Course. Radiology: ordered.  Risk Prescription drug management.   This patient complains of general weakness and feeling sick; this involves an extensive number of treatment Options and is a complaint that carries with it a high risk of complications and morbidity. The differential includes viral syndrome, COVID, flu, pneumonia, UTI, dehydration  I ordered, reviewed and interpreted labs, which included CBC with elevated white count stable low hemoglobin, chemistries with chronic CKD low bicarb, urinalysis without clear signs of infection, COVID and flu negative, troponins mildly elevated but flat I ordered medication IV fluids nausea medication cough medicine and reviewed PMP when indicated. I ordered imaging studies which included chest x-ray and I independently    visualized and interpreted imaging which showed stable chronic findings Previous records obtained and reviewed in epic including recent discharge summary and ED notes Cardiac monitoring reviewed, sinus rhythm Social determinants considered, patient with financial insecurity and physically  inactive Critical Interventions: None  After the interventions stated above, I reevaluated the patient and found patient to be hemodynamically stable and in no distress Admission and further testing considered, her care is signed  out to Dr. Wallace Cullens to follow-up on results of urinalysis.  If no clear indications for admission patient potentially could be discharged to closely follow-up with PCP.         Final Clinical Impression(s) / ED Diagnoses Final diagnoses:  Cough, unspecified type  Malaise    Rx / DC Orders ED Discharge Orders     None         Terrilee Files, MD 03/09/23 1747

## 2023-03-08 NOTE — ED Provider Notes (Addendum)
  Provider Note MRN:  [id number]  Arrival date & time: 03/08/23    ED Course and Medical Decision Making  Assumed care from [facility] at shift change.  See note from prior team for complete details, in brief:  87 yo female from home "Feels terrible" Similar presentation few days ago Urine pending  Plan per prior physician f/u urine  Clinical Course as of 03/08/23 1938  Thu Mar 08, 2023  1027 EKG not crossing in epic.  Normal sinus rhythm no acute ST-T changes.  Similar to prior EKG from 3 days ago. [MB]  1046 Chest x-ray interpreted by me as no acute infiltrate, does have some chronic interstitial changes.  Awaiting radiology reading. [MB]  1622 UA neg for acute infection  [SG]  1747 Creatinine(!): 2.34 Similar to prior  [SG]  1747 Troponin I (High Sensitivity)(!): 25 Flat, no cp, EKG w/o ischemic changes  [SG]    Clinical Course User Index [MB] Terrilee Files, MD [SG] Tanda Rockers A, DO     Labs stable Exam stable She is HDS  UA negative for faction, she is feeling much better, no hypoxia, no chest pain or dyspnea.  She is tolerant p.o. difficulty.  Plan discharge per plan of prior team  The patient improved significantly and was discharged in stable condition. Detailed discussions were had with the patient regarding current findings, and need for close f/u with PCP or on call doctor. The patient has been instructed to return immediately if the symptoms worsen in any way for re-evaluation. Patient verbalized understanding and is in agreement with current care plan. All questions answered prior to discharge.    Procedures  Final Clinical Impressions(s) / ED Diagnoses     ICD-10-CM   1. Abdominal pain, unspecified abdominal location  R10.9     2. Cough, unspecified type  R05.9       ED Discharge Orders     None         Discharge Instructions      It was a pleasure caring for you today in the emergency department.  Please follow up with PCP for recheck in  the next few days  Please return to the emergency department for any worsening or worrisome symptoms.          Sloan Leiter, DO 03/08/23 1936    Sloan Leiter, DO 03/08/23 [phone]

## 2023-03-08 NOTE — Discharge Instructions (Addendum)
It was a pleasure caring for you today in the emergency department.  Please follow up with PCP for recheck in the next few days  Please return to the emergency department for any worsening or worrisome symptoms.

## 2023-03-08 NOTE — ED Notes (Addendum)
Received report from [facility] RN. Pt resting. States she feels better. Attempting urine sample. Nad. A/o. Color wnl. Awaiting lab results.

## 2023-03-08 NOTE — ED Triage Notes (Signed)
Pt BIB ems for weakness and dehydration. Pt was seen yesterday and discharged. Per EMS pt states she cannot get out of the bed from the weakness. Pt does have a congestion. CBG 123. Pt has intermittent SOB.

## 2023-03-08 NOTE — ED Notes (Signed)
Family updated as to patient's status.

## 2023-03-08 NOTE — ED Notes (Signed)
Per [name], NT+3, pt refused in and out cath. She reported she could use the bedpan which she did and urine specimen was sent for urinalysis.

## 2023-03-15 ENCOUNTER — Ambulatory Visit (INDEPENDENT_AMBULATORY_CARE_PROVIDER_SITE_OTHER): Payer: 59 | Admitting: Internal Medicine

## 2023-03-15 ENCOUNTER — Encounter: Payer: Self-pay | Admitting: Internal Medicine

## 2023-03-15 VITALS — BP 130/83 | HR 80

## 2023-03-15 DIAGNOSIS — I5032 Chronic diastolic (congestive) heart failure: Secondary | ICD-10-CM | POA: Diagnosis not present

## 2023-03-15 DIAGNOSIS — J440 Chronic obstructive pulmonary disease with acute lower respiratory infection: Secondary | ICD-10-CM | POA: Diagnosis not present

## 2023-03-15 DIAGNOSIS — J209 Acute bronchitis, unspecified: Secondary | ICD-10-CM | POA: Insufficient documentation

## 2023-03-15 DIAGNOSIS — Z09 Encounter for follow-up examination after completed treatment for conditions other than malignant neoplasm: Secondary | ICD-10-CM | POA: Diagnosis not present

## 2023-03-15 MED ORDER — PROMETHAZINE-DM 6.25-15 MG/5ML PO SYRP: 5.0000 mL | ORAL_SOLUTION | Freq: Four times a day (QID) | ORAL | 0 refills | Status: DC | PRN

## 2023-03-15 MED ORDER — AZITHROMYCIN 250 MG PO TABS: ORAL_TABLET | ORAL | 0 refills | Status: AC

## 2023-03-15 NOTE — Progress Notes (Signed)
Established Patient Office Visit  Subjective:  Patient ID: [name], female    DOB: 1935/09/03  Age: 87 y.o. MRN: [id number]  CC:  Chief Complaint  Patient presents with   Cough    Coughing, patient went to ER was given benzonatate and had to stop taking it due to making patient hallucinating     HPI [name] is a 87 y.o. female with past medical history of hypertension, CKD stage IV, fibromyalgia, chronic pelvic pain, chronic constipation, hypothyroidism, osteoarthritis of knees and depression who presents for f/u of recent ER visits.  She went to ER on 03/08/23 for cough and chest congestion.  She had chest x-ray done, which showed stable chronic lung parenchymal disease -nonspecific linear opacities throughout bilateral lungs.  She was given Tessalon from ER, but had visual hallucinations from it (?).  She still complains of cough with clear expectoration and chest tightness.  Denies any fever or chills.  Of note, she is currently not taking Lasix as well.  Past Medical History:  Diagnosis Date   Anxiety disorder, unspecified    Arteriosclerotic cardiovascular disease (ASCVD) 2006   Nonobstructive; < 50% lesions on cath 2002; negative stress nuclear study in 10/2004   Asthma    Chest pain, unspecified    Chronic kidney disease, unspecified    Chronic obstructive pulmonary disease, unspecified (HCC)    Chronic pelvic pain in female    Colitis due to Clostridium difficile 1994   1994   COPD (chronic obstructive pulmonary disease) (HCC)    Depression with anxiety    Diarrhea, unspecified    Disorder of thyroid, unspecified    Gastro-esophageal reflux disease without esophagitis    Gastroesophageal reflux disease    Hyperlipidemia    Hyperlipidemia, unspecified    Hypertension    Hypertensive chronic kidney disease with stage 1 through stage 4 chronic kidney disease, or unspecified chronic kidney disease    Hyperthyroidism    Hypothyroidism    Hypothyroidism,  unspecified    Kidney disease, chronic, stage III (moderate, EGFR 30-59 ml/min) (HCC)    Lower abdominal pain, unspecified    Major depressive disorder, single episode, severe without psychotic features (HCC)    Major depressive disorder, single episode, unspecified    Nausea    Other postherpetic nervous system involvement    Pelvic and perineal pain    Rash and other nonspecific skin eruption    S/P colonoscopy 2007   few diverticula, otherwise nl   S/P colonoscopy 12/13/2010   rectal, cecal polyp, left-sided diverticulosis, hyperplastic. ?anal fissure? treated empirically   S/P endoscopy 2009   linear esophageal erosions   S/P endoscopy 05/10/2010   Question island of salmon-colored epithelium in distal esophagus ; no Barrett's.    Shingles    Tobacco abuse, in remission    55-pack-year consumption; discontinued 08/2010   Unspecified asthma, uncomplicated    Unspecified osteoarthritis, unspecified site    Urinary tract infection, site not specified    Uterine cancer [facility])     Past Surgical History:  Procedure Laterality Date   ABDOMINAL HYSTERECTOMY     BIOPSY  12/06/2022   Procedure: BIOPSY;  Surgeon: Corbin Ade, MD;  [facility];  Service: Endoscopy;;   BREAST LUMPECTOMY     CATARACT EXTRACTION     CHOLECYSTECTOMY  1962   COLONOSCOPY  12/13/2010   Left-sided diverticulosis.  Cecal polyp, status post hot snare polypectomy/ Diminutive rectal polyp, status post cold biopsy removal tender/painful anal canal, ?  occult anal fissure- Not visualized   COLONOSCOPY N/A 10/14/2015   Diverticulosis   ESOPHAGOGASTRODUODENOSCOPY  05/10/10   benign mucosa with mild chronic inflammation.   ESOPHAGOGASTRODUODENOSCOPY (EGD) WITH PROPOFOL N/A 02/13/2019   Procedure: ESOPHAGOGASTRODUODENOSCOPY (EGD) WITH PROPOFOL;  Surgeon: Corbin Ade, MD;  [facility];  Service: Endoscopy;  Laterality: N/A;  3:00pm   ESOPHAGOGASTRODUODENOSCOPY (EGD) WITH PROPOFOL N/A 12/06/2022    Procedure: ESOPHAGOGASTRODUODENOSCOPY (EGD) WITH PROPOFOL;  Surgeon: Corbin Ade, MD;  [facility];  Service: Endoscopy;  Laterality: N/A;  2:30 PM, ASA 3/4   FLEXIBLE SIGMOIDOSCOPY  12/29/2011   Procedure: FLEXIBLE SIGMOIDOSCOPY;  Surgeon: Malissa Hippo, MD;  [facility];  Service: Endoscopy;  Laterality: N/A;  200-Ann notified pt to be here @ 1:00   NISSEN FUNDOPLICATION     YAG LASER APPLICATION Left 05/25/2014   Procedure: YAG LASER APPLICATION;  Surgeon: Susa Simmonds, MD;  Location: [facility];  Service: Ophthalmology;  Laterality: Left;    Family History  Problem Relation Age of Onset   Heart disease Father    Diabetes Mother    Alzheimer's disease Mother    Cancer Mother    Heart disease Brother        Also mother, Father, brother and 2 sons   Hypertension Brother        also Sister x2   Cancer Sister    Cancer Son        testicular cancer    Aneurysm Son    Diabetes Son    Colon cancer Neg Hx     Social History   Socioeconomic History   Marital status: Married    Spouse name: Not on file   Number of children: 4   Years of education: Not on file   Highest education level: Not on file  Occupational History   Not on file  Tobacco Use   Smoking status: Former    Current packs/day: 0.00    Average packs/day: 1 pack/day for 55.0 years (55.0 ttl pk-yrs)    Types: Cigarettes    Start date: 09/15/1955    Quit date: 09/15/2010    Years since quitting: 12.5   Smokeless tobacco: Never  Vaping Use   Vaping status: Never Used  Substance and Sexual Activity   Alcohol use: No    Alcohol/week: 0.0 standard drinks of alcohol   Drug use: No   Sexual activity: Not Currently    Birth control/protection: Surgical    Comment: hyst  Other Topics Concern   Not on file  Social History Narrative   Not on file   Social Drivers of Health   Financial Resource Strain: High Risk (02/07/2023)   Overall Financial Resource Strain (CARDIA)    Difficulty  of Paying Living Expenses: Very hard  Food Insecurity: No Food Insecurity (02/07/2023)   Hunger Vital Sign    Worried About Running Out of Food in the Last Year: Never true    Ran Out of Food in the Last Year: Never true  Transportation Needs: No Transportation Needs (02/07/2023)   PRAPARE - Administrator, Civil Service (Medical): No    Lack of Transportation (Non-Medical): No  Physical Activity: Inactive (02/07/2023)   Exercise Vital Sign    Days of Exercise per Week: 0 days    Minutes of Exercise per Session: 10 min  Stress: No Stress Concern Present (02/07/2023)   [employer] of Occupational Health - Occupational Stress Questionnaire    Feeling  of Stress : Not at all  Social Connections: Moderately Isolated (02/07/2023)   Social Connection and Isolation Panel [NHANES]    Frequency of Communication with Friends and Family: Twice a week    Frequency of Social Gatherings with Friends and Family: Once a week    Attends Religious Services: Never    Database administrator or Organizations: No    Attends Banker Meetings: Never    Marital Status: Married  Catering manager Violence: Not At Risk (02/07/2023)   Humiliation, Afraid, Rape, and Kick questionnaire    Fear of Current or Ex-Partner: No    Emotionally Abused: No    Physically Abused: No    Sexually Abused: No    Outpatient Medications Prior to Visit  Medication Sig Dispense Refill   albuterol (VENTOLIN HFA) 108 (90 Base) MCG/ACT inhaler Inhale 1-2 puffs into the lungs every 6 (six) hours as needed for wheezing or shortness of breath. 8 g 5   amLODipine (NORVASC) 5 MG tablet Take 5 mg by mouth daily.     atorvastatin (LIPITOR) 20 MG tablet TAKE ONE TABLET BY MOUTH ONCE DAILY. 90 tablet 0   carvedilol (COREG) 6.25 MG tablet TAKE (1) TABLET BY MOUTH TWICE DAILY. (Patient taking differently: Take 6.25 mg by mouth 2 (two) times daily with a meal.) 180 tablet 0   esomeprazole (NEXIUM) 40 MG capsule  Take 40 mg by mouth 2 (two) times daily.     furosemide (LASIX) 20 MG tablet Take 1 tablet (20 mg total) by mouth daily. 90 tablet 3   ipratropium-albuterol (DUONEB) 0.5-2.5 (3) MG/3ML SOLN INHALE 1 VIAL VIA NEBULIZER EVERY (6) HOURS AS NEEDED. 360 mL 2   levothyroxine (SYNTHROID) 75 MCG tablet TAKE 1 TABLET BY MOUTH DAILY BEFORE BREAKFAST 90 tablet 1   metoCLOPramide (REGLAN) 5 MG tablet Take 1 tablet (5 mg total) by mouth every 8 (eight) hours as needed for nausea. 60 tablet 2   mirtazapine (REMERON) 7.5 MG tablet Take 1 tablet (7.5 mg total) by mouth at bedtime. 30 tablet 5   oxyCODONE-acetaminophen (PERCOCET) 7.5-325 MG tablet Take 1 tablet by mouth 3 (three) times daily as needed.     RABEprazole (ACIPHEX) 20 MG tablet Take 1 tablet (20 mg total) by mouth 2 (two) times daily before a meal. 180 tablet 3   No facility-administered medications prior to visit.    Allergies  Allergen Reactions   Tessalon [Benzonatate]     Hallucinations   Hydralazine Other (See Comments)    Breathing   Nalbuphine Rash   Paroxetine Itching   Sulfa Antibiotics Other (See Comments)    Childhood reaction.   Venlafaxine Itching    ROS Review of Systems  Constitutional:  Positive for fatigue. Negative for appetite change, chills and fever.  HENT:  Negative for congestion, sinus pressure, sinus pain and sore throat.   Eyes:  Negative for pain and discharge.  Respiratory:  Positive for cough and shortness of breath.   Cardiovascular:  Positive for leg swelling. Negative for chest pain and palpitations.  Gastrointestinal:  Positive for nausea. Negative for abdominal pain, diarrhea and vomiting.  Endocrine: Negative for polydipsia and polyuria.  Genitourinary:  Negative for dysuria, frequency and hematuria.  Musculoskeletal:  Positive for arthralgias and back pain. Negative for neck pain and neck stiffness.  Skin:  Negative for rash.  Neurological:  Negative for dizziness and weakness.   Psychiatric/Behavioral:  Positive for sleep disturbance. Negative for agitation and behavioral problems.  Objective:    Physical Exam Vitals reviewed.  Constitutional:      General: She is not in acute distress.    Appearance: She is not diaphoretic.     Comments: In wheelchair  HENT:     Head: Normocephalic and atraumatic.     Nose: Nose normal.     Mouth/Throat:     Mouth: Mucous membranes are moist.  Eyes:     General: No scleral icterus.    Extraocular Movements: Extraocular movements intact.  Cardiovascular:     Rate and Rhythm: Normal rate and regular rhythm.     Heart sounds: Normal heart sounds. No murmur heard. Pulmonary:     Breath sounds: Rhonchi (B/l) present. No wheezing.  Musculoskeletal:        General: Swelling (Right knee, no warmth) present.     Cervical back: Neck supple. No tenderness.     Right lower leg: Edema (1+) present.     Left lower leg: Edema (1+) present.  Skin:    General: Skin is warm.     Findings: Erythema (Senile purpuric lesions (b/l UE)) present.  Neurological:     General: No focal deficit present.     Mental Status: She is alert and oriented to person, place, and time. Mental status is at baseline.     Sensory: No sensory deficit.     Motor: Weakness (3+ in b/l UE and LE) present.     Gait: Gait abnormal.  Psychiatric:        Mood and Affect: Mood normal.        Behavior: Behavior normal.     BP 130/83 (BP Location: Left Arm, Patient Position: Sitting, Cuff Size: Normal)   Pulse 80   SpO2 94%  Wt Readings from Last 3 Encounters:  03/08/23 109 lb 1.3 oz (49.5 kg)  03/05/23 110 lb (49.9 kg)  02/07/23 114 lb 3.2 oz (51.8 kg)    Lab Results  Component Value Date   TSH 4.220 02/07/2023   Lab Results  Component Value Date   WBC 12.6 (H) 03/08/2023   HGB 10.9 (L) 03/08/2023   HCT 33.6 (L) 03/08/2023   MCV 91.3 03/08/2023   PLT 226 03/08/2023   Lab Results  Component Value Date   NA 132 (L) 03/08/2023   K 4.1  03/08/2023   CO2 15 (L) 03/08/2023   GLUCOSE 107 (H) 03/08/2023   BUN 40 (H) 03/08/2023   CREATININE 2.34 (H) 03/08/2023   BILITOT 0.7 03/05/2023   ALKPHOS 83 03/05/2023   AST 17 03/05/2023   ALT 13 03/05/2023   PROT 7.4 03/05/2023   ALBUMIN 3.5 03/05/2023   CALCIUM 8.6 (L) 03/08/2023   ANIONGAP 14 03/08/2023   EGFR 19.0 10/31/2022   Lab Results  Component Value Date   CHOL 148 02/07/2023   Lab Results  Component Value Date   HDL 58 02/07/2023   Lab Results  Component Value Date   LDLCALC 75 02/07/2023   Lab Results  Component Value Date   TRIG 78 02/07/2023   Lab Results  Component Value Date   CHOLHDL 2.6 02/07/2023   Lab Results  Component Value Date   HGBA1C 5.5 02/07/2023      Assessment & Plan:   Problem List Items Addressed This Visit       Cardiovascular and Mediastinum   (HFpEF) heart failure with preserved ejection fraction (HCC)   Chronic leg swelling and dyspnea Needs leg elevation for leg swelling Advised to take Lasix 20 mg  once daily for the next 3 days as she has crackles on physical exam, her dyspnea could be worse due to volume overload as well        Respiratory   Acute bronchitis with COPD (HCC) - Primary   Her symptoms are likely due to acute bronchitis Started empiric azithromycin Avoided steroids due to concern for coexistent CHF Continue DuoNeb at least twice daily Promethazine DM as needed for cough      Relevant Medications   promethazine-dextromethorphan (PROMETHAZINE-DM) 6.25-15 MG/5ML syrup   azithromycin (ZITHROMAX) 250 MG tablet     Other   Encounter for examination following treatment at hospital   Recent ER chart reviewed, including imaging Was seen for cough, but has persistent symptoms       Meds ordered this encounter  Medications   promethazine-dextromethorphan (PROMETHAZINE-DM) 6.25-15 MG/5ML syrup    Sig: Take 5 mLs by mouth 4 (four) times daily as needed.    Dispense:  118 mL    Refill:  0    azithromycin (ZITHROMAX) 250 MG tablet    Sig: Take 2 tablets on day 1, then 1 tablet daily on days 2 through 5    Dispense:  6 tablet    Refill:  0    Follow-up: Return if symptoms worsen or fail to improve.    Anabel Halon, MD

## 2023-03-15 NOTE — Assessment & Plan Note (Signed)
Recent ER chart reviewed, including imaging Was seen for cough, but has persistent symptoms

## 2023-03-15 NOTE — Patient Instructions (Signed)
Please start taking Azithromycin as prescribed.  Please take Promethazine-DM syrup as needed for cough.  Please use Duoneb at least twice daily.  Please take Lasix once daily at least for next 3 days and then as needed for leg swelling.

## 2023-03-15 NOTE — Assessment & Plan Note (Signed)
Her symptoms are likely due to acute bronchitis Started empiric azithromycin Avoided steroids due to concern for coexistent CHF Continue DuoNeb at least twice daily Promethazine DM as needed for cough

## 2023-03-15 NOTE — Assessment & Plan Note (Addendum)
Chronic leg swelling and dyspnea Needs leg elevation for leg swelling Advised to take Lasix 20 mg once daily for the next 3 days as she has crackles on physical exam, her dyspnea could be worse due to volume overload as well

## 2023-03-16 ENCOUNTER — Ambulatory Visit: Payer: 59 | Admitting: Internal Medicine

## 2023-03-16 ENCOUNTER — Other Ambulatory Visit: Payer: Self-pay | Admitting: Internal Medicine

## 2023-03-19 ENCOUNTER — Other Ambulatory Visit: Payer: Self-pay | Admitting: Gastroenterology

## 2023-03-28 DIAGNOSIS — R81 Glycosuria: Secondary | ICD-10-CM | POA: Diagnosis not present

## 2023-03-28 DIAGNOSIS — R8290 Unspecified abnormal findings in urine: Secondary | ICD-10-CM | POA: Diagnosis not present

## 2023-03-28 DIAGNOSIS — R300 Dysuria: Secondary | ICD-10-CM | POA: Diagnosis not present

## 2023-03-29 ENCOUNTER — Other Ambulatory Visit: Payer: Self-pay | Admitting: Internal Medicine

## 2023-03-29 DIAGNOSIS — R110 Nausea: Secondary | ICD-10-CM

## 2023-04-03 ENCOUNTER — Other Ambulatory Visit: Payer: Self-pay | Admitting: Gastroenterology

## 2023-04-04 MED ORDER — ESOMEPRAZOLE MAGNESIUM 40 MG PO CPDR: 40.0000 mg | DELAYED_RELEASE_CAPSULE | Freq: Two times a day (BID) | ORAL | 3 refills | Status: DC

## 2023-04-04 NOTE — Telephone Encounter (Signed)
 Ok, d/c'd rabeprazole off list and refilled the esopmeprazole

## 2023-04-04 NOTE — Telephone Encounter (Signed)
 [name], I keep getting refill request for esomeprazole but I put her on rabeprazole at her last ov. Can we find out what is happening, is she taking both?

## 2023-04-04 NOTE — Addendum Note (Signed)
 Addended by: Tiffany Kocher on: 04/04/2023 09:51 AM   Modules accepted: Orders

## 2023-04-05 DIAGNOSIS — M797 Fibromyalgia: Secondary | ICD-10-CM | POA: Diagnosis not present

## 2023-04-05 DIAGNOSIS — I10 Essential (primary) hypertension: Secondary | ICD-10-CM | POA: Diagnosis not present

## 2023-04-05 DIAGNOSIS — G8929 Other chronic pain: Secondary | ICD-10-CM | POA: Diagnosis not present

## 2023-04-05 DIAGNOSIS — R2689 Other abnormalities of gait and mobility: Secondary | ICD-10-CM | POA: Diagnosis not present

## 2023-04-10 DIAGNOSIS — R300 Dysuria: Secondary | ICD-10-CM | POA: Diagnosis not present

## 2023-04-17 ENCOUNTER — Other Ambulatory Visit: Payer: Self-pay | Admitting: Nurse Practitioner

## 2023-04-17 DIAGNOSIS — I10 Essential (primary) hypertension: Secondary | ICD-10-CM

## 2023-04-19 ENCOUNTER — Other Ambulatory Visit: Payer: Self-pay | Admitting: Internal Medicine

## 2023-04-19 DIAGNOSIS — I10 Essential (primary) hypertension: Secondary | ICD-10-CM

## 2023-04-23 ENCOUNTER — Other Ambulatory Visit: Payer: Self-pay | Admitting: Internal Medicine

## 2023-04-23 DIAGNOSIS — I10 Essential (primary) hypertension: Secondary | ICD-10-CM

## 2023-04-23 NOTE — Telephone Encounter (Signed)
Copied from CRM [id number]. Topic: Clinical - Medication Refill >> Apr 23, 2023 10:43 AM Fuller Mandril wrote: Most Recent Primary Care Visit:  Provider: Anabel Halon  Department: [facility]  Visit Type: OFFICE VISIT  Date: 03/15/2023  Medication: carvedilol (COREG) 6.25 MG tablet  Has the patient contacted their pharmacy? Yes (Agent: If no, request that the patient contact the pharmacy for the refill. If patient does not wish to contact the pharmacy document the reason why and proceed with request.) (Agent: If yes, when and what did the pharmacy advise?) pharmacy submitted request has not received response   Is this the correct pharmacy for this prescription? Yes If no, delete pharmacy and type the correct one.  This is the patient's preferred pharmacy:  [facility] [place], [place] - [id number] [place] [address] Phone: [phone] Fax: [phone]   Has the prescription been filled recently? No  Is the patient out of the medication? Yes - last one today  Has the patient been seen for an appointment in the last year OR does the patient have an upcoming appointment? Yes - last seen dec 19th next appt May   Can we respond through MyChart? No  Agent: Please be advised that Rx refills may take up to 3 business days. We ask that you follow-up with your pharmacy.

## 2023-04-27 ENCOUNTER — Other Ambulatory Visit: Payer: 59

## 2023-04-27 ENCOUNTER — Ambulatory Visit: Payer: 59 | Admitting: Obstetrics & Gynecology

## 2023-05-07 ENCOUNTER — Other Ambulatory Visit: Payer: Self-pay | Admitting: Internal Medicine

## 2023-05-21 DIAGNOSIS — N184 Chronic kidney disease, stage 4 (severe): Secondary | ICD-10-CM | POA: Diagnosis not present

## 2023-05-28 DIAGNOSIS — D631 Anemia in chronic kidney disease: Secondary | ICD-10-CM | POA: Diagnosis not present

## 2023-05-28 DIAGNOSIS — N2581 Secondary hyperparathyroidism of renal origin: Secondary | ICD-10-CM | POA: Diagnosis not present

## 2023-05-28 DIAGNOSIS — I129 Hypertensive chronic kidney disease with stage 1 through stage 4 chronic kidney disease, or unspecified chronic kidney disease: Secondary | ICD-10-CM | POA: Diagnosis not present

## 2023-05-28 DIAGNOSIS — R809 Proteinuria, unspecified: Secondary | ICD-10-CM | POA: Diagnosis not present

## 2023-05-28 DIAGNOSIS — N189 Chronic kidney disease, unspecified: Secondary | ICD-10-CM | POA: Diagnosis not present

## 2023-05-28 DIAGNOSIS — N184 Chronic kidney disease, stage 4 (severe): Secondary | ICD-10-CM | POA: Diagnosis not present

## 2023-05-30 ENCOUNTER — Encounter: Payer: Self-pay | Admitting: Obstetrics & Gynecology

## 2023-05-30 ENCOUNTER — Other Ambulatory Visit: Payer: Self-pay | Admitting: Obstetrics & Gynecology

## 2023-05-30 ENCOUNTER — Ambulatory Visit (INDEPENDENT_AMBULATORY_CARE_PROVIDER_SITE_OTHER): Payer: 59 | Admitting: Obstetrics & Gynecology

## 2023-05-30 ENCOUNTER — Ambulatory Visit: Payer: 59

## 2023-05-30 VITALS — BP 189/84 | HR 60

## 2023-05-30 DIAGNOSIS — N83201 Unspecified ovarian cyst, right side: Secondary | ICD-10-CM

## 2023-05-30 NOTE — Progress Notes (Addendum)
 PELVIC US TA ONLY:normal vaginal cuff,simple right adnexal cyst 2.9 x 2.8 x 3 cm,left ovary not visualized,no free fluid,right adnexal discomfort over cyst

## 2023-05-30 NOTE — Progress Notes (Signed)
 GYN VISIT Patient name: [name] MRN [id number]  Date of birth: 1935/07/16 Chief Complaint:   Follow-up ([place] today)  History of Present Illness:   [name] is a 88 y.o. [id number] PM, PH female being seen today for follow up regarding ovarian cyst.  In review, she was seen in November 2024 due to an incidental ovarian cyst seen on CT.  She presented today for follow-up ultrasound.  She denies any abdominal or pelvic pain.  She reports no acute GYN concerns  [place] today: normal vaginal cuff,simple right adnexal cyst 2.9 x 2.8 x 3 cm,left ovary not visualized,no free fluid,right adnexal discomfort ovary cyst   No LMP recorded. Patient has had a hysterectomy.    Review of Systems:   Pertinent items are noted in HPI Denies fever/chills, dizziness, headaches, visual disturbances, fatigue, shortness of breath, chest pain, abdominal pain, vomiting Pertinent History Reviewed:   Past Surgical History:  Procedure Laterality Date   ABDOMINAL HYSTERECTOMY     BIOPSY  12/06/2022   Procedure: BIOPSY;  Surgeon: Corbin Ade, MD;  [facility];  Service: Endoscopy;;   BREAST LUMPECTOMY     CATARACT EXTRACTION     CHOLECYSTECTOMY  1962   COLONOSCOPY  12/13/2010   Left-sided diverticulosis.  Cecal polyp, status post hot snare polypectomy/ Diminutive rectal polyp, status post cold biopsy removal tender/painful anal canal, ? occult anal fissure- Not visualized   COLONOSCOPY N/A 10/14/2015   Diverticulosis   ESOPHAGOGASTRODUODENOSCOPY  05/10/10   benign mucosa with mild chronic inflammation.   ESOPHAGOGASTRODUODENOSCOPY (EGD) WITH PROPOFOL N/A 02/13/2019   Procedure: ESOPHAGOGASTRODUODENOSCOPY (EGD) WITH PROPOFOL;  Surgeon: Corbin Ade, MD;  [facility];  Service: Endoscopy;  Laterality: N/A;  3:00pm   ESOPHAGOGASTRODUODENOSCOPY (EGD) WITH PROPOFOL N/A 12/06/2022   Procedure: ESOPHAGOGASTRODUODENOSCOPY (EGD) WITH PROPOFOL;  Surgeon: Corbin Ade, MD;  Location[facility];  Service: Endoscopy;  Laterality: N/A;  2:30 PM, ASA 3/4   FLEXIBLE SIGMOIDOSCOPY  12/29/2011   Procedure: FLEXIBLE SIGMOIDOSCOPY;  Surgeon: Malissa Hippo, MD;  [facility];  Service: Endoscopy;  Laterality: N/A;  200-Ann notified pt to be here @ 1:00   NISSEN FUNDOPLICATION     YAG LASER APPLICATION Left 05/25/2014   Procedure: YAG LASER APPLICATION;  Surgeon: Susa Simmonds, MD;  Location: [facility];  Service: Ophthalmology;  Laterality: Left;    Past Medical History:  Diagnosis Date   Anxiety disorder, unspecified    Arteriosclerotic cardiovascular disease (ASCVD) 2006   Nonobstructive; < 50% lesions on cath 2002; negative stress nuclear study in 10/2004   Asthma    Chest pain, unspecified    Chronic kidney disease, unspecified    Chronic obstructive pulmonary disease, unspecified (HCC)    Chronic pelvic pain in female    Colitis due to Clostridium difficile 1994   1994   COPD (chronic obstructive pulmonary disease) (HCC)    Depression with anxiety    Diarrhea, unspecified    Disorder of thyroid, unspecified    Gastro-esophageal reflux disease without esophagitis    Gastroesophageal reflux disease    Hyperlipidemia    Hyperlipidemia, unspecified    Hypertension    Hypertensive chronic kidney disease with stage 1 through stage 4 chronic kidney disease, or unspecified chronic kidney disease    Hyperthyroidism    Hypothyroidism    Hypothyroidism, unspecified    Kidney disease, chronic, stage III (moderate, EGFR 30-59 ml/min) (HCC)    Lower abdominal pain, unspecified  Major depressive disorder, single episode, severe without psychotic features (HCC)    Major depressive disorder, single episode, unspecified    Nausea    Other postherpetic nervous system involvement    Pelvic and perineal pain    Rash and other nonspecific skin eruption    S/P colonoscopy 2007   few diverticula, otherwise nl   S/P colonoscopy 12/13/2010   rectal, cecal polyp,  left-sided diverticulosis, hyperplastic. ?anal fissure? treated empirically   S/P endoscopy 2009   linear esophageal erosions   S/P endoscopy 05/10/2010   Question island of salmon-colored epithelium in distal esophagus ; no Barrett's.    Shingles    Tobacco abuse, in remission    55-pack-year consumption; discontinued 08/2010   Unspecified asthma, uncomplicated    Unspecified osteoarthritis, unspecified site    Urinary tract infection, site not specified    Uterine cancer [facility])    Reviewed problem list, medications and allergies. Physical Assessment:   Vitals:   05/30/23 1400  BP: (!) 189/84  Pulse: 60  There is no height or weight on file to calculate BMI.       Physical Examination:   General appearance: alert, well appearing, and in no distress Pt in wheelchair  Psych: mood appropriate, normal affect  Skin: warm & dry   Cardiovascular: normal heart rate noted  Respiratory: normal respiratory effort, no distress  Abdomen: soft, non-tender, no rebound, no guarding  Pelvic: examination not indicated  Extremities: no edema   Chaperone: N/A    Assessment & Plan:  1) Right ovarian cyst -Reviewed today's ultrasound report, cyst appears benign in nature and no changes since prior imaging -Patient asymptomatic -No further follow-up or examinations indicated   No orders of the defined types were placed in this encounter.   Return if symptoms worsen or fail to improve.   Myna Hidalgo, DO Attending Obstetrician & Gynecologist, [facility] for [employer], [facility]

## 2023-06-29 ENCOUNTER — Other Ambulatory Visit: Payer: Self-pay | Admitting: Internal Medicine

## 2023-06-29 DIAGNOSIS — R110 Nausea: Secondary | ICD-10-CM

## 2023-07-06 ENCOUNTER — Other Ambulatory Visit: Payer: Self-pay | Admitting: Internal Medicine

## 2023-07-06 DIAGNOSIS — E039 Hypothyroidism, unspecified: Secondary | ICD-10-CM

## 2023-07-23 ENCOUNTER — Other Ambulatory Visit: Payer: Self-pay | Admitting: Internal Medicine

## 2023-07-23 DIAGNOSIS — R110 Nausea: Secondary | ICD-10-CM

## 2023-07-29 ENCOUNTER — Other Ambulatory Visit: Payer: Self-pay | Admitting: Internal Medicine

## 2023-07-29 DIAGNOSIS — G4701 Insomnia due to medical condition: Secondary | ICD-10-CM

## 2023-07-30 ENCOUNTER — Ambulatory Visit (INDEPENDENT_AMBULATORY_CARE_PROVIDER_SITE_OTHER): Payer: 59 | Admitting: Internal Medicine

## 2023-07-30 ENCOUNTER — Encounter: Payer: Self-pay | Admitting: Internal Medicine

## 2023-07-30 ENCOUNTER — Other Ambulatory Visit: Payer: Self-pay | Admitting: Internal Medicine

## 2023-07-30 VITALS — BP 151/86 | HR 56 | Ht 60.0 in | Wt 114.6 lb

## 2023-07-30 DIAGNOSIS — I5032 Chronic diastolic (congestive) heart failure: Secondary | ICD-10-CM | POA: Diagnosis not present

## 2023-07-30 DIAGNOSIS — N184 Chronic kidney disease, stage 4 (severe): Secondary | ICD-10-CM | POA: Diagnosis not present

## 2023-07-30 DIAGNOSIS — G309 Alzheimer's disease, unspecified: Secondary | ICD-10-CM | POA: Diagnosis not present

## 2023-07-30 DIAGNOSIS — N3000 Acute cystitis without hematuria: Secondary | ICD-10-CM | POA: Diagnosis not present

## 2023-07-30 DIAGNOSIS — E039 Hypothyroidism, unspecified: Secondary | ICD-10-CM

## 2023-07-30 DIAGNOSIS — J42 Unspecified chronic bronchitis: Secondary | ICD-10-CM

## 2023-07-30 DIAGNOSIS — E782 Mixed hyperlipidemia: Secondary | ICD-10-CM | POA: Diagnosis not present

## 2023-07-30 DIAGNOSIS — I10 Essential (primary) hypertension: Secondary | ICD-10-CM | POA: Diagnosis not present

## 2023-07-30 DIAGNOSIS — F02B18 Dementia in other diseases classified elsewhere, moderate, with other behavioral disturbance: Secondary | ICD-10-CM

## 2023-07-30 MED ORDER — BREZTRI AEROSPHERE 160-9-4.8 MCG/ACT IN AERO: 2.0000 | INHALATION_SPRAY | Freq: Two times a day (BID) | RESPIRATORY_TRACT | 2 refills | Status: AC

## 2023-07-30 MED ORDER — NITROFURANTOIN MONOHYD MACRO 100 MG PO CAPS: 100.0000 mg | ORAL_CAPSULE | Freq: Two times a day (BID) | ORAL | 0 refills | Status: DC

## 2023-07-30 NOTE — Assessment & Plan Note (Signed)
 Lab Results  Component Value Date   TSH 4.220 02/07/2023    Had decreased dose of levothyroxine  to 75 mcg QD Check TSH, free T4 Reviewed US  thyroid 

## 2023-07-30 NOTE — Patient Instructions (Signed)
 Please take Lasix  at least twice in a week.  Please start taking Nitrofurantoin  for 5 days for UTI.  Please continue to take medications as prescribed.  Please continue to follow low salt diet and ambulate as tolerated.

## 2023-07-30 NOTE — Progress Notes (Unsigned)
 Established Patient Office Visit  Subjective:  Patient ID: [name], female    DOB: [birth year]  Age: 88 y.o. MRN: [id number]  CC:  Chief Complaint  Patient presents with   Medical Management of Chronic Issues    6 month f/u   Urinary Tract Infection    Reports sx of uti. Sample collected.    HPI [name] is a 88 y.o. female with past medical history of hypertension, CKD stage IV, fibromyalgia, chronic pelvic pain, chronic constipation, hypothyroidism, osteoarthritis of knees and depression who presents for f/u of her chronic medical conditions.  She was evaluated by GI for persistent nausea.  EGD was unremarkable overall except patchy erythema of gastric mucosa.  She was placed on Nexium  by GI.  She also takes Zofran  as needed for nausea, but still has persistent nausea.  HTN: Her BP is well-controlled for her age. Followed by nephrology.  Denies any chest pain, headache or dizziness currently.  CKD stage IV: Followed by nephrology. Denies any fever, chills or hematuria currently.  Hypothyroidism: Has been taking levothyroxine  75 mcg QD.  Her TSH was wnl in 11/24. Denies any tremors, palpitations or jitteriness.  COPD: Her breathing has improved with Breztri  and as needed albuterol  now.  She denies any acute worsening of dyspnea or wheezing currently.  She was placed on Remeron  for insomnia and improving her appetite.  She reports mild improvement in appetite.  She has a history of dementia as well, but her son denies agitation episodes.  She reports dysuria and urinary frequency for the last 1 week.  Denies any fever, chills currently.  She has a history of recurrent UTI.  Past Medical History:  Diagnosis Date   Anxiety disorder, unspecified    Arteriosclerotic cardiovascular disease (ASCVD) 2006   Nonobstructive; < 50% lesions on cath 2002; negative stress nuclear study in 10/2004   Asthma    Chest pain, unspecified    Chronic kidney disease, unspecified     Chronic obstructive pulmonary disease, unspecified (HCC)    Chronic pelvic pain in female    Colitis due to Clostridium difficile 1994   1994   COPD (chronic obstructive pulmonary disease) (HCC)    Depression with anxiety    Diarrhea, unspecified    Disorder of thyroid , unspecified    Gastro-esophageal reflux disease without esophagitis    Gastroesophageal reflux disease    Hyperlipidemia    Hyperlipidemia, unspecified    Hypertension    Hypertensive chronic kidney disease with stage 1 through stage 4 chronic kidney disease, or unspecified chronic kidney disease    Hyperthyroidism    Hypothyroidism    Hypothyroidism, unspecified    Kidney disease, chronic, stage III (moderate, EGFR 30-59 ml/min) (HCC)    Lower abdominal pain, unspecified    Major depressive disorder, single episode, severe without psychotic features (HCC)    Major depressive disorder, single episode, unspecified    Nausea    Other postherpetic nervous system involvement    Pelvic and perineal pain    Rash and other nonspecific skin eruption    S/P colonoscopy 2007   few diverticula, otherwise nl   S/P colonoscopy 12/13/2010   rectal, cecal polyp, left-sided diverticulosis, hyperplastic. ?anal fissure? treated empirically   S/P endoscopy 2009   linear esophageal erosions   S/P endoscopy 05/10/2010   Question island of salmon-colored epithelium in distal esophagus ; no Barrett's.    Shingles    Tobacco abuse, in remission    55-pack-year consumption; discontinued 08/2010  Unspecified asthma, uncomplicated    Unspecified osteoarthritis, unspecified site    Urinary tract infection, site not specified    Uterine cancer [facility])     Past Surgical History:  Procedure Laterality Date   ABDOMINAL HYSTERECTOMY     BIOPSY  12/06/2022   Procedure: BIOPSY;  Surgeon: Suzette Espy, MD;  Location: [facility];  Service: Endoscopy;;   BREAST LUMPECTOMY     CATARACT EXTRACTION     CHOLECYSTECTOMY  1962   COLONOSCOPY   12/13/2010   Left-sided diverticulosis.  Cecal polyp, status post hot snare polypectomy/ Diminutive rectal polyp, status post cold biopsy removal tender/painful anal canal, ? occult anal fissure- Not visualized   COLONOSCOPY N/A 10/14/2015   Diverticulosis   ESOPHAGOGASTRODUODENOSCOPY  05/10/10   benign mucosa with mild chronic inflammation.   ESOPHAGOGASTRODUODENOSCOPY (EGD) WITH PROPOFOL  N/A 02/13/2019   Procedure: ESOPHAGOGASTRODUODENOSCOPY (EGD) WITH PROPOFOL ;  Surgeon: Suzette Espy, MD;  Location: [facility];  Service: Endoscopy;  Laterality: N/A;  3:00pm   ESOPHAGOGASTRODUODENOSCOPY (EGD) WITH PROPOFOL  N/A 12/06/2022   Procedure: ESOPHAGOGASTRODUODENOSCOPY (EGD) WITH PROPOFOL ;  Surgeon: Suzette Espy, MD;  [facility]: [facility];  Service: Endoscopy;  Laterality: N/A;  2:30 PM, ASA 3/4   FLEXIBLE SIGMOIDOSCOPY  12/29/2011   Procedure: FLEXIBLE SIGMOIDOSCOPY;  Surgeon: Ruby Corporal, MD;  Location: [facility];  Service: Endoscopy;  Laterality: N/A;  200-Ann notified pt to be here @ 1:00   NISSEN FUNDOPLICATION     YAG LASER APPLICATION Left 05/25/2014   Procedure: YAG LASER APPLICATION;  Surgeon: Clay Cummins, MD;  Location: [facility];  Service: Ophthalmology;  Laterality: Left;    Family History  Problem Relation Age of Onset   Heart disease Father    Diabetes Mother    Alzheimer's disease Mother    Cancer Mother    Heart disease Brother        Also mother, Father, brother and 2 sons   Hypertension Brother        also Sister x2   Cancer Sister    Cancer Son        testicular cancer    Aneurysm Son    Diabetes Son    Colon cancer Neg Hx     Social History   Socioeconomic History   Marital status: Married    Spouse name: Not on file   Number of children: 4   Years of education: Not on file   Highest education level: Not on file  Occupational History   Not on file  Tobacco Use   Smoking status: Former    Current packs/day: 0.00    Average packs/day: 1  pack/day for 55.0 years (55.0 ttl pk-yrs)    Types: Cigarettes    Start date: 09/15/1955    Quit date: 09/15/2010    Years since quitting: 12.8   Smokeless tobacco: Never  Vaping Use   Vaping status: Never Used  Substance and Sexual Activity   Alcohol  use: No    Alcohol /week: 0.0 standard drinks of alcohol    Drug use: No   Sexual activity: Not Currently    Birth control/protection: Surgical    Comment: hyst  Other Topics Concern   Not on file  Social History Narrative   Not on file   Social Drivers of Health   Financial Resource Strain: High Risk (02/07/2023)   Overall Financial Resource Strain (CARDIA)    Difficulty of Paying Living Expenses: Very hard  Food Insecurity: No Food Insecurity (02/07/2023)  Hunger Vital Sign    Worried About Running Out of Food in the Last Year: Never true    Ran Out of Food in the Last Year: Never true  Transportation Needs: No Transportation Needs (02/07/2023)   PRAPARE - Administrator, Civil Service (Medical): No    Lack of Transportation (Non-Medical): No  Physical Activity: Inactive (02/07/2023)   Exercise Vital Sign    Days of Exercise per Week: 0 days    Minutes of Exercise per Session: 10 min  Stress: No Stress Concern Present (02/07/2023)   [employer] of Occupational Health - Occupational Stress Questionnaire    Feeling of Stress : Not at all  Social Connections: Moderately Isolated (02/07/2023)   Social Connection and Isolation Panel [NHANES]    Frequency of Communication with Friends and Family: Twice a week    Frequency of Social Gatherings with Friends and Family: Once a week    Attends Religious Services: Never    Database administrator or Organizations: No    Attends Banker Meetings: Never    Marital Status: Married  Catering manager Violence: Not At Risk (02/07/2023)   Humiliation, Afraid, Rape, and Kick questionnaire    Fear of Current or Ex-Partner: No    Emotionally Abused: No     Physically Abused: No    Sexually Abused: No    Outpatient Medications Prior to Visit  Medication Sig Dispense Refill   albuterol  (VENTOLIN  HFA) 108 (90 Base) MCG/ACT inhaler Inhale 1-2 puffs into the lungs every 6 (six) hours as needed for wheezing or shortness of breath. 8 g 5   amLODipine  (NORVASC ) 5 MG tablet Take 5 mg by mouth daily.     atorvastatin  (LIPITOR) 20 MG tablet TAKE ONE TABLET BY MOUTH ONCE DAILY. 90 tablet 0   carvedilol  (COREG ) 6.25 MG tablet TAKE (1) TABLET BY MOUTH TWICE DAILY. (Patient taking differently: Take 6.25 mg by mouth 2 (two) times daily with a meal.) 180 tablet 0   esomeprazole  (NEXIUM ) 40 MG capsule Take 1 capsule (40 mg total) by mouth 2 (two) times daily before a meal. 180 capsule 3   furosemide  (LASIX ) 20 MG tablet Take 1 tablet (20 mg total) by mouth daily. 90 tablet 3   ipratropium-albuterol  (DUONEB) 0.5-2.5 (3) MG/3ML SOLN INHALE 1 VIAL VIA NEBULIZER EVERY (6) HOURS AS NEEDED. 360 mL 2   levothyroxine  (SYNTHROID ) 75 MCG tablet TAKE 1 TABLET BY MOUTH DAILY BEFORE BREAKFAST 90 tablet 1   metoCLOPramide  (REGLAN ) 5 MG tablet Take 1 tablet (5 mg total) by mouth every 8 (eight) hours as needed for nausea. 60 tablet 2   mirtazapine  (REMERON ) 7.5 MG tablet Take 1 tablet (7.5 mg total) by mouth at bedtime. 30 tablet 5   oxyCODONE -acetaminophen  (PERCOCET) 7.5-325 MG tablet Take 1 tablet by mouth 3 (three) times daily as needed.     promethazine -dextromethorphan (PROMETHAZINE -DM) 6.25-15 MG/5ML syrup Take 5 mLs by mouth 4 (four) times daily as needed. 118 mL 0   BREZTRI  AEROSPHERE 160-9-4.8 MCG/ACT AERO inhaler INHALE 2 PUFFS INTO THE LUNGS TWICE DAILY. 10.7 g 0   No facility-administered medications prior to visit.    Allergies  Allergen Reactions   Tessalon  [Benzonatate ]     Hallucinations   Hydralazine  Other (See Comments)    Breathing   Nalbuphine Rash   Paroxetine Itching   Sulfa Antibiotics Other (See Comments)    Childhood reaction.   Venlafaxine  Itching    ROS Review of Systems  Constitutional:  Positive for fatigue. Negative for appetite change, chills and fever.  HENT:  Negative for congestion, sinus pressure, sinus pain and sore throat.   Eyes:  Negative for pain and discharge.  Respiratory:  Positive for shortness of breath. Negative for cough.   Cardiovascular:  Positive for leg swelling. Negative for chest pain and palpitations.  Gastrointestinal:  Positive for nausea. Negative for abdominal pain, diarrhea and vomiting.  Endocrine: Negative for polydipsia and polyuria.  Genitourinary:  Positive for dysuria and frequency. Negative for hematuria.  Musculoskeletal:  Positive for arthralgias and back pain. Negative for neck pain and neck stiffness.  Skin:  Negative for rash.  Neurological:  Positive for weakness. Negative for dizziness.  Psychiatric/Behavioral:  Positive for sleep disturbance. Negative for agitation and behavioral problems.       Objective:    Physical Exam Vitals reviewed.  Constitutional:      General: She is not in acute distress.    Appearance: She is not diaphoretic.     Comments: In wheelchair  HENT:     Head: Normocephalic and atraumatic.     Nose: Nose normal.     Mouth/Throat:     Mouth: Mucous membranes are moist.  Eyes:     General: No scleral icterus.    Extraocular Movements: Extraocular movements intact.  Cardiovascular:     Rate and Rhythm: Normal rate and regular rhythm.     Heart sounds: Normal heart sounds. No murmur heard. Pulmonary:     Breath sounds: Normal breath sounds. No wheezing or rales.  Abdominal:     Palpations: Abdomen is soft.     Tenderness: There is no abdominal tenderness.  Musculoskeletal:        General: Swelling (Right knee, no warmth) present.     Cervical back: Neck supple. No tenderness.     Right lower leg: No edema.     Left lower leg: No edema.  Skin:    General: Skin is warm.     Findings: Erythema (Senile purpuric lesions (b/l UE)) present.   Neurological:     General: No focal deficit present.     Mental Status: She is alert and oriented to person, place, and time. Mental status is at baseline.     Sensory: No sensory deficit.     Motor: Weakness (3+ in b/l UE and LE) present.     Gait: Gait abnormal.  Psychiatric:        Mood and Affect: Mood normal.        Behavior: Behavior normal.     BP 138/86 (BP Location: Left Arm)   Pulse (!) 56   Ht 5' (1.524 m)   Wt 114 lb 9.6 oz (52 kg)   SpO2 92%   BMI 22.38 kg/m  Wt Readings from Last 3 Encounters:  07/30/23 114 lb 9.6 oz (52 kg)  03/08/23 109 lb 1.3 oz (49.5 kg)  03/05/23 110 lb (49.9 kg)    Lab Results  Component Value Date   TSH 4.220 02/07/2023   Lab Results  Component Value Date   WBC 12.6 (H) 03/08/2023   HGB 10.9 (L) 03/08/2023   HCT 33.6 (L) 03/08/2023   MCV 91.3 03/08/2023   PLT 226 03/08/2023   Lab Results  Component Value Date   NA 132 (L) 03/08/2023   K 4.1 03/08/2023   CO2 15 (L) 03/08/2023   GLUCOSE 107 (H) 03/08/2023   BUN 40 (H) 03/08/2023   CREATININE 2.34 (H) 03/08/2023   BILITOT 0.7 03/05/2023  ALKPHOS 83 03/05/2023   AST 17 03/05/2023   ALT 13 03/05/2023   PROT 7.4 03/05/2023   ALBUMIN 3.5 03/05/2023   CALCIUM  8.6 (L) 03/08/2023   ANIONGAP 14 03/08/2023   EGFR 19.0 10/31/2022   Lab Results  Component Value Date   CHOL 148 02/07/2023   Lab Results  Component Value Date   HDL 58 02/07/2023   Lab Results  Component Value Date   LDLCALC 75 02/07/2023   Lab Results  Component Value Date   TRIG 78 02/07/2023   Lab Results  Component Value Date   CHOLHDL 2.6 02/07/2023   Lab Results  Component Value Date   HGBA1C 5.5 02/07/2023      Assessment & Plan:   Problem List Items Addressed This Visit       Cardiovascular and Mediastinum   Primary hypertension (Chronic)   BP Readings from Last 1 Encounters:  07/30/23 138/86   Well-controlled for her age, with Coreg  6.25 mg twice daily and Lasix  20 mg once daily  PRN (leg swelling) Chlorthalidone  12.5 mg once daily has been discontinued by nephrology due to worsening of CKD Counseled for compliance with the medications Advised DASH diet Chronic pain and CKD also contributing to high BP Followed by Nephrologist      (HFpEF) heart failure with preserved ejection fraction (HCC)   Chronic leg swelling and dyspnea Needs leg elevation for leg swelling Advised to take Lasix  20 mg once daily for the next 3 days as she has crackles on physical exam, her dyspnea could be worse due to volume overload as well        Respiratory   Chronic obstructive pulmonary disease, unspecified (HCC)   Well controlled now with Breztri - needs to rinse mouth after each use Has Duoneb for better compliance and has had better response with nebulizer Educated about use of maintenance therapy and rescue inhaler      Relevant Medications   budeson-glycopyrrolate -formoterol (BREZTRI  AEROSPHERE) 160-9-4.8 MCG/ACT AERO inhaler     Endocrine   Hypothyroidism   Lab Results  Component Value Date   TSH 4.220 02/07/2023    Had decreased dose of levothyroxine  to 75 mcg QD Check TSH, free T4 Reviewed US  thyroid         Nervous and Auditory   Moderate Alzheimer's dementia (HCC)   Cognitive decline with insomnia and episodes of hallucinations - at her stage, donepezil or memantine would not be helpful and will add side effects, including dizziness and hyponatremia Remeron  for insomnia Needs frequent reorientation Dependent for ADLs and IADLs, has good family support        Genitourinary   CKD (chronic kidney disease) stage 4, GFR 15-29 ml/min (HCC)   Follows up with nephrologist Follows up with hematooncologist for evaluation of elevated light chain levels Advised to follow renal diet as advised by nephrologist and dietitian Avoid nephrotoxic agents including NSAIDs Last BMP reviewed from nephrology visit - continue to follow up regularly      Acute cystitis without  hematuria - Primary   UA reviewed, LE + Started empiric Macrobid  Check urine culture      Relevant Medications   nitrofurantoin , macrocrystal-monohydrate, (MACROBID ) 100 MG capsule   Other Relevant Orders   Urinalysis (Completed)   Urine Culture     Other   Hyperlipidemia   On Atorvastatin  20 mg QD       Meds ordered this encounter  Medications   budeson-glycopyrrolate -formoterol (BREZTRI  AEROSPHERE) 160-9-4.8 MCG/ACT AERO inhaler    Sig: Inhale 2  puffs into the lungs 2 (two) times daily.    Dispense:  10.7 g    Refill:  2   nitrofurantoin , macrocrystal-monohydrate, (MACROBID ) 100 MG capsule    Sig: Take 1 capsule (100 mg total) by mouth 2 (two) times daily.    Dispense:  10 capsule    Refill:  0    Follow-up: Return in about 6 months (around 01/30/2024) for HTN and CKD (after 01/30/24).    Meldon Sport, MD

## 2023-07-31 LAB — URINALYSIS
Bilirubin, UA: NEGATIVE
Glucose, UA: NEGATIVE
Ketones, UA: NEGATIVE
Nitrite, UA: NEGATIVE
Specific Gravity, UA: 1.025 (ref 1.005–1.030)
Urobilinogen, Ur: 0.2 mg/dL (ref 0.2–1.0)
pH, UA: 5.5 (ref 5.0–7.5)

## 2023-08-01 NOTE — Assessment & Plan Note (Signed)
On Atorvastatin 20 mg QD 

## 2023-08-01 NOTE — Assessment & Plan Note (Signed)
 Chronic leg swelling and dyspnea Needs leg elevation for leg swelling Advised to take Lasix 20 mg once daily for the next 3 days as she has crackles on physical exam, her dyspnea could be worse due to volume overload as well

## 2023-08-01 NOTE — Assessment & Plan Note (Signed)
 BP Readings from Last 1 Encounters:  07/30/23 138/86   Well-controlled for her age, with Coreg  6.25 mg twice daily and Lasix  20 mg once daily PRN (leg swelling) Chlorthalidone  12.5 mg once daily has been discontinued by nephrology due to worsening of CKD Counseled for compliance with the medications Advised DASH diet Chronic pain and CKD also contributing to high BP Followed by Nephrologist

## 2023-08-01 NOTE — Assessment & Plan Note (Signed)
 Well controlled now with Breztri - needs to rinse mouth after each use Has Duoneb for better compliance and has had better response with nebulizer Educated about use of maintenance therapy and rescue inhaler

## 2023-08-01 NOTE — Assessment & Plan Note (Signed)
 UA reviewed, LE + Started empiric Macrobid  Check urine culture

## 2023-08-01 NOTE — Assessment & Plan Note (Signed)
 Follows up with nephrologist Follows up with hematooncologist for evaluation of elevated light chain levels Advised to follow renal diet as advised by nephrologist and dietitian Avoid nephrotoxic agents including NSAIDs Last BMP reviewed from nephrology visit - continue to follow up regularly

## 2023-08-01 NOTE — Assessment & Plan Note (Signed)
Cognitive decline with insomnia and episodes of hallucinations - at her stage, donepezil or memantine would not be helpful and will add side effects, including dizziness and hyponatremia Remeron for insomnia Needs frequent reorientation Dependent for ADLs and IADLs, has good family support

## 2023-08-02 ENCOUNTER — Ambulatory Visit: Payer: Self-pay

## 2023-08-02 DIAGNOSIS — G8929 Other chronic pain: Secondary | ICD-10-CM | POA: Diagnosis not present

## 2023-08-02 DIAGNOSIS — R2689 Other abnormalities of gait and mobility: Secondary | ICD-10-CM | POA: Diagnosis not present

## 2023-08-02 DIAGNOSIS — M797 Fibromyalgia: Secondary | ICD-10-CM | POA: Diagnosis not present

## 2023-08-02 LAB — URINE CULTURE

## 2023-08-02 NOTE — Telephone Encounter (Signed)
 C/o dizziness on macrobid  and wants to know if she should continue or change to something else. UTI sx's are improving on med.

## 2023-08-02 NOTE — Telephone Encounter (Signed)
Pt informed

## 2023-08-02 NOTE — Telephone Encounter (Signed)
 Information obtained from [facility].   Chief Complaint: dizziness Symptoms: head feels big and bloated Frequency: ongoing issues, has seen doctor for this Pertinent Negatives: denies speech changes, dizziness changes from baseline,  Disposition: [] ED /[] Urgent Care (no appt availability in office) / [] Appointment(In office/virtual)/ []  [facility]/ [x] Home Care/ [] Refused Recommended Disposition /[] [facility]/ [x]  Follow-up with PCP Additional Notes: Original call this morning was to state that the pt was going to stop take her macrobid . Pt states her UTI s/s are improving. Pt states that her current dizziness is no different. Pt is aware that many abx have side effects and after talking to her daughter states that she will take the medication as prescribed. Pt lives with family. Pt is on way to pain management clinic. Pt walked during the NT and states that dizziness is the same, and this has been evaluated by PCP per called. Pt advised to increase fluid intake.   Reason for Disposition  Dizziness caused by not drinking enough fluids  Answer Assessment - Initial Assessment Questions 1. DESCRIPTION: "Describe your dizziness."     My head feels big and bloated 2. LIGHTHEADED: "Do you feel lightheaded?" (e.g., somewhat faint, woozy, weak upon standing)     Pt walking per normal during call 3. VERTIGO: "Do you feel like either you or the room is spinning or tilting?" (i.e. vertigo)     No feels like head is big and bloated 4. SEVERITY: "How bad is it?"  "Do you feel like you are going to faint?" "Can you stand and walk?"   - MILD: Feels slightly dizzy, but walking normally.   - MODERATE: Feels unsteady when walking, but not falling; interferes with normal activities (e.g., school, work).   - SEVERE: Unable to walk without falling, or requires assistance to walk without falling; feels like passing out now.       5. ONSET:  "When did the dizziness begin?"     Per daughter  she has lived with pt for 3 months, and pt has been stating this is the same dizziness she c/o every day. 6. AGGRAVATING FACTORS: "Does anything make it worse?" (e.g., standing, change in head position)     Caller unsure 8. CAUSE: "What do you think is causing the dizziness?"     Unsure, this is ongoing issue, DPR states that she has been evaluated for this. Pt concerned that this is side effects from medication-macrobid  9. RECURRENT SYMPTOM: "Have you had dizziness before?" If Yes, ask: "When was the last time?" "What happened that time?"     Ongoing for at least 3 months 10. OTHER SYMPTOMS: "Do you have any other symptoms?" (e.g., fever, chest pain, vomiting, diarrhea, bleeding)       denies  Protocols used: Dizziness - Lightheadedness-A-AH

## 2023-08-06 ENCOUNTER — Other Ambulatory Visit: Payer: Self-pay | Admitting: Internal Medicine

## 2023-08-23 DIAGNOSIS — N184 Chronic kidney disease, stage 4 (severe): Secondary | ICD-10-CM | POA: Diagnosis not present

## 2023-08-27 DIAGNOSIS — N184 Chronic kidney disease, stage 4 (severe): Secondary | ICD-10-CM | POA: Diagnosis not present

## 2023-08-27 DIAGNOSIS — I129 Hypertensive chronic kidney disease with stage 1 through stage 4 chronic kidney disease, or unspecified chronic kidney disease: Secondary | ICD-10-CM | POA: Diagnosis not present

## 2023-08-27 DIAGNOSIS — R809 Proteinuria, unspecified: Secondary | ICD-10-CM | POA: Diagnosis not present

## 2023-08-27 DIAGNOSIS — N2581 Secondary hyperparathyroidism of renal origin: Secondary | ICD-10-CM | POA: Diagnosis not present

## 2023-08-27 DIAGNOSIS — D631 Anemia in chronic kidney disease: Secondary | ICD-10-CM | POA: Diagnosis not present

## 2023-08-30 ENCOUNTER — Ambulatory Visit (HOSPITAL_COMMUNITY)
Admission: RE | Admit: 2023-08-30 | Discharge: 2023-08-30 | Disposition: A | Discharge status: Home / Self Care | Source: Ambulatory Visit | Attending: Family Medicine | Admitting: Family Medicine

## 2023-08-30 ENCOUNTER — Ambulatory Visit (INDEPENDENT_AMBULATORY_CARE_PROVIDER_SITE_OTHER): Admitting: Family Medicine

## 2023-08-30 ENCOUNTER — Other Ambulatory Visit (HOSPITAL_COMMUNITY)
Admission: RE | Admit: 2023-08-30 | Discharge: 2023-08-30 | Disposition: A | Discharge status: Home / Self Care | Source: Ambulatory Visit | Attending: Family Medicine | Admitting: Family Medicine

## 2023-08-30 ENCOUNTER — Encounter: Payer: Self-pay | Admitting: Family Medicine

## 2023-08-30 ENCOUNTER — Ambulatory Visit: Payer: Self-pay | Admitting: Family Medicine

## 2023-08-30 VITALS — BP 154/84 | HR 68 | Resp 16 | Ht 60.0 in

## 2023-08-30 DIAGNOSIS — J440 Chronic obstructive pulmonary disease with acute lower respiratory infection: Secondary | ICD-10-CM

## 2023-08-30 DIAGNOSIS — J209 Acute bronchitis, unspecified: Secondary | ICD-10-CM

## 2023-08-30 DIAGNOSIS — J201 Acute bronchitis due to Hemophilus influenzae: Secondary | ICD-10-CM | POA: Diagnosis present

## 2023-08-30 DIAGNOSIS — I5032 Chronic diastolic (congestive) heart failure: Secondary | ICD-10-CM | POA: Diagnosis not present

## 2023-08-30 DIAGNOSIS — I10 Essential (primary) hypertension: Secondary | ICD-10-CM | POA: Diagnosis not present

## 2023-08-30 DIAGNOSIS — R059 Cough, unspecified: Secondary | ICD-10-CM | POA: Diagnosis not present

## 2023-08-30 DIAGNOSIS — J449 Chronic obstructive pulmonary disease, unspecified: Secondary | ICD-10-CM | POA: Diagnosis not present

## 2023-08-30 LAB — BASIC METABOLIC PANEL WITH GFR
Anion gap: 7 (ref 5–15)
BUN: 41 mg/dL — ABNORMAL HIGH (ref 8–23)
CO2: 19 mmol/L — ABNORMAL LOW (ref 22–32)
Calcium: 8.7 mg/dL — ABNORMAL LOW (ref 8.9–10.3)
Chloride: 109 mmol/L (ref 98–111)
Creatinine, Ser: 2.79 mg/dL — ABNORMAL HIGH (ref 0.44–1.00)
GFR, Estimated: 16 mL/min — ABNORMAL LOW (ref >60)
Glucose, Bld: 116 mg/dL — ABNORMAL HIGH (ref 70–99)
Potassium: 4.4 mmol/L (ref 3.5–5.1)
Sodium: 135 mmol/L (ref 135–145)

## 2023-08-30 LAB — CBC WITH DIFFERENTIAL/PLATELET
Abs Immature Granulocytes: 0.01 10*3/uL (ref 0.00–0.07)
Basophils Absolute: 0 10*3/uL (ref 0.0–0.1)
Basophils Relative: 0 %
Eosinophils Absolute: 0.1 10*3/uL (ref 0.0–0.5)
Eosinophils Relative: 2 %
HCT: 37.8 % (ref 36.0–46.0)
Hemoglobin: 12.3 g/dL (ref 12.0–15.0)
Immature Granulocytes: 0 %
Lymphocytes Relative: 32 %
Lymphs Abs: 1.9 10*3/uL (ref 0.7–4.0)
MCH: 29.6 pg (ref 26.0–34.0)
MCHC: 32.5 g/dL (ref 30.0–36.0)
MCV: 90.9 fL (ref 80.0–100.0)
Monocytes Absolute: 0.4 10*3/uL (ref 0.1–1.0)
Monocytes Relative: 7 %
Neutro Abs: 3.5 10*3/uL (ref 1.7–7.7)
Neutrophils Relative %: 59 %
Platelets: 196 10*3/uL (ref 150–400)
RBC: 4.16 MIL/uL (ref 3.87–5.11)
RDW: 13.7 % (ref 11.5–15.5)
WBC: 6 10*3/uL (ref 4.0–10.5)
nRBC: 0 % (ref 0.0–0.2)

## 2023-08-30 MED ORDER — CEFTRIAXONE SODIUM 500 MG IJ SOLR
500.0000 mg | Freq: Once | INTRAMUSCULAR | Status: AC
  Administered 2023-08-30: 500 mg via INTRAMUSCULAR

## 2023-08-30 MED ORDER — PENICILLIN V POTASSIUM 500 MG PO TABS: 500.0000 mg | ORAL_TABLET | Freq: Three times a day (TID) | ORAL | 0 refills | Status: AC

## 2023-08-30 NOTE — Assessment & Plan Note (Addendum)
 Rocephin  500 mg Im in office followed by penicillin x 10 days CXR stat today cBC and diff stat today  and chem 7

## 2023-08-30 NOTE — Patient Instructions (Addendum)
 F/U with PCP as before  You are treated for acute bronchitis, Rocephin  500 mg IM I office and 10 day course of penicillin in office   Use chlorocidin for cough and continue neb treatments as before  CXR and blood work today , we will call with results in te afternoon  cBC and diff and chem 7 today stat  Thanks for choosing [facility], we consider it a privelige to serve you.

## 2023-09-03 ENCOUNTER — Encounter: Payer: Self-pay | Admitting: Family Medicine

## 2023-09-03 NOTE — Assessment & Plan Note (Signed)
No s/s of decompensation currently

## 2023-09-03 NOTE — Progress Notes (Signed)
   [name]     MRN: [id number]      DOB: [birth year]  Chief Complaint  Patient presents with   Cough    Pt complains of productive cough, congestion, headache, and sore throat since Sunday. Denies fever. Has not taken anything otc     HPI [name] is here with above concern ROS Uncertain of fever, has had chills  Denies chest pains, palpitations and leg swelling Denies abdominal pain, nausea, vomiting,diarrhea or constipation.   Denies dysuria, frequency, hesitancy or incontinence. Chronic  joint pain,  and limitation in mobility.Wheelchair dependent  Denies depression, anxiety or insomnia. Denies skin break down or rash.   PE  BP (!) 154/84   Pulse 68   Resp 16   Ht 5' (1.524 m)   SpO2 (!) 88%   BMI 22.38 kg/m   Patient alert and oriented HEENT: No facial asymmetry, EOMI,     Neck supple .  Chest: decreased air entry bilateral crackles and few wheezes.  CVS: S1, S2 n, no S3.Regular rate.  ABD: Soft non tender.   Ext: No edema Markedly decreased  ROM spine, shoulders, hips and knees.  Skin: Intact, no ulcerations or rash noted.  Psych: Good eye contact, normal affect. Memory intact not anxious or depressed appearing.  CNS: CN 2-12 intact, no focal deficits noted.   Assessment & Plan  Acute bronchitis with COPD (HCC) Rocephin  500 mg Im in office followed by penicillin  x 10 days CXR stat today cBC and diff stat today  and chem 7   (HFpEF) heart failure with preserved ejection fraction (HCC) No s/s of decompensation currently  Primary hypertension Elevated at visit , management per OCP, sodium restriction discussed briefly

## 2023-09-03 NOTE — Assessment & Plan Note (Signed)
 Elevated at visit , management per OCP, sodium restriction discussed briefly

## 2023-09-20 DIAGNOSIS — I10 Essential (primary) hypertension: Secondary | ICD-10-CM | POA: Diagnosis not present

## 2023-09-20 DIAGNOSIS — M797 Fibromyalgia: Secondary | ICD-10-CM | POA: Diagnosis not present

## 2023-09-20 DIAGNOSIS — R2689 Other abnormalities of gait and mobility: Secondary | ICD-10-CM | POA: Diagnosis not present

## 2023-09-20 DIAGNOSIS — G8929 Other chronic pain: Secondary | ICD-10-CM | POA: Diagnosis not present

## 2023-10-14 ENCOUNTER — Other Ambulatory Visit: Payer: Self-pay | Admitting: Internal Medicine

## 2023-10-14 DIAGNOSIS — R110 Nausea: Secondary | ICD-10-CM

## 2023-11-05 ENCOUNTER — Other Ambulatory Visit: Payer: Self-pay | Admitting: Internal Medicine

## 2023-12-24 DIAGNOSIS — M797 Fibromyalgia: Secondary | ICD-10-CM | POA: Diagnosis not present

## 2023-12-24 DIAGNOSIS — R2689 Other abnormalities of gait and mobility: Secondary | ICD-10-CM | POA: Diagnosis not present

## 2023-12-24 DIAGNOSIS — I10 Essential (primary) hypertension: Secondary | ICD-10-CM | POA: Diagnosis not present

## 2023-12-24 DIAGNOSIS — G8929 Other chronic pain: Secondary | ICD-10-CM | POA: Diagnosis not present

## 2023-12-30 ENCOUNTER — Other Ambulatory Visit: Payer: Self-pay | Admitting: Internal Medicine

## 2023-12-30 DIAGNOSIS — E039 Hypothyroidism, unspecified: Secondary | ICD-10-CM

## 2023-12-31 DIAGNOSIS — N2581 Secondary hyperparathyroidism of renal origin: Secondary | ICD-10-CM | POA: Diagnosis not present

## 2023-12-31 DIAGNOSIS — N189 Chronic kidney disease, unspecified: Secondary | ICD-10-CM | POA: Diagnosis not present

## 2023-12-31 DIAGNOSIS — I129 Hypertensive chronic kidney disease with stage 1 through stage 4 chronic kidney disease, or unspecified chronic kidney disease: Secondary | ICD-10-CM | POA: Diagnosis not present

## 2023-12-31 DIAGNOSIS — R809 Proteinuria, unspecified: Secondary | ICD-10-CM | POA: Diagnosis not present

## 2023-12-31 DIAGNOSIS — D631 Anemia in chronic kidney disease: Secondary | ICD-10-CM | POA: Diagnosis not present

## 2023-12-31 DIAGNOSIS — N184 Chronic kidney disease, stage 4 (severe): Secondary | ICD-10-CM | POA: Diagnosis not present

## 2024-01-14 ENCOUNTER — Other Ambulatory Visit: Payer: Self-pay | Admitting: Internal Medicine

## 2024-01-14 DIAGNOSIS — I10 Essential (primary) hypertension: Secondary | ICD-10-CM

## 2024-01-18 ENCOUNTER — Other Ambulatory Visit: Payer: Self-pay | Admitting: Internal Medicine

## 2024-01-18 DIAGNOSIS — I129 Hypertensive chronic kidney disease with stage 1 through stage 4 chronic kidney disease, or unspecified chronic kidney disease: Secondary | ICD-10-CM

## 2024-01-26 ENCOUNTER — Other Ambulatory Visit: Payer: Self-pay | Admitting: Internal Medicine

## 2024-01-26 DIAGNOSIS — G4701 Insomnia due to medical condition: Secondary | ICD-10-CM

## 2024-01-29 ENCOUNTER — Other Ambulatory Visit: Payer: Self-pay | Admitting: Internal Medicine

## 2024-01-29 DIAGNOSIS — R110 Nausea: Secondary | ICD-10-CM

## 2024-01-30 ENCOUNTER — Encounter: Payer: Self-pay | Admitting: Internal Medicine

## 2024-01-30 ENCOUNTER — Ambulatory Visit: Admitting: Internal Medicine

## 2024-01-30 VITALS — BP 152/84 | HR 64 | Ht 60.0 in | Wt 110.8 lb

## 2024-01-30 DIAGNOSIS — I110 Hypertensive heart disease with heart failure: Secondary | ICD-10-CM

## 2024-01-30 DIAGNOSIS — N3000 Acute cystitis without hematuria: Secondary | ICD-10-CM

## 2024-01-30 DIAGNOSIS — Z23 Encounter for immunization: Secondary | ICD-10-CM | POA: Diagnosis not present

## 2024-01-30 DIAGNOSIS — F02B18 Dementia in other diseases classified elsewhere, moderate, with other behavioral disturbance: Secondary | ICD-10-CM

## 2024-01-30 DIAGNOSIS — Z0001 Encounter for general adult medical examination with abnormal findings: Secondary | ICD-10-CM

## 2024-01-30 DIAGNOSIS — K219 Gastro-esophageal reflux disease without esophagitis: Secondary | ICD-10-CM

## 2024-01-30 DIAGNOSIS — L219 Seborrheic dermatitis, unspecified: Secondary | ICD-10-CM

## 2024-01-30 DIAGNOSIS — E039 Hypothyroidism, unspecified: Secondary | ICD-10-CM

## 2024-01-30 DIAGNOSIS — I5030 Unspecified diastolic (congestive) heart failure: Secondary | ICD-10-CM

## 2024-01-30 DIAGNOSIS — G309 Alzheimer's disease, unspecified: Secondary | ICD-10-CM | POA: Diagnosis not present

## 2024-01-30 DIAGNOSIS — N390 Urinary tract infection, site not specified: Secondary | ICD-10-CM

## 2024-01-30 DIAGNOSIS — I10 Essential (primary) hypertension: Secondary | ICD-10-CM

## 2024-01-30 DIAGNOSIS — I5032 Chronic diastolic (congestive) heart failure: Secondary | ICD-10-CM

## 2024-01-30 DIAGNOSIS — N184 Chronic kidney disease, stage 4 (severe): Secondary | ICD-10-CM

## 2024-01-30 MED ORDER — NITROFURANTOIN MONOHYD MACRO 100 MG PO CAPS: 100.0000 mg | ORAL_CAPSULE | Freq: Two times a day (BID) | ORAL | 0 refills | Status: DC

## 2024-01-30 MED ORDER — KETOCONAZOLE 2 % EX SHAM: 1.0000 | MEDICATED_SHAMPOO | CUTANEOUS | 0 refills | Status: AC

## 2024-01-30 NOTE — Progress Notes (Unsigned)
 Established Patient Office Visit  Subjective:  Patient ID: [name], female    DOB: September 18, 1935  Age: 88 y.o. MRN: [id number]  CC:  Chief Complaint  Patient presents with   Hypertension    6 month f/u    Chronic Kidney Disease    6 month f/u    Urinary Tract Infection    Reports sx of uti.     HPI [name] is a 88 y.o. female with past medical history of hypertension, CKD stage IV, fibromyalgia, chronic pelvic pain, chronic constipation, hypothyroidism, osteoarthritis of knees and depression who presents for f/u of her chronic medical conditions.  She was evaluated by GI for persistent nausea.  EGD was unremarkable overall except patchy erythema of gastric mucosa.  She was placed on Nexium  by GI.  She also takes Zofran  as needed for nausea, but still has persistent nausea.  HTN: Her BP is well-controlled for her age. Followed by nephrology.  Denies any chest pain, headache or dizziness currently.  CKD stage IV: Followed by nephrology. Denies any fever, chills or hematuria currently.  Hypothyroidism: Has been taking levothyroxine  75 mcg QD.  Her TSH was wnl in 11/24. Denies any tremors, palpitations or jitteriness.  COPD: Her breathing has improved with Breztri  and as needed albuterol  now.  She denies any acute worsening of dyspnea or wheezing currently.  She was placed on Remeron  for insomnia and improving her appetite.  She reports mild improvement in appetite.  She has a history of dementia as well, but her son denies agitation episodes.  She reports dysuria and urinary frequency for the last 1 week.  Denies any fever, chills currently.  She has a history of recurrent UTI.  Past Medical History:  Diagnosis Date   Anxiety disorder, unspecified    Arteriosclerotic cardiovascular disease (ASCVD) 2006   Nonobstructive; < 50% lesions on cath 2002; negative stress nuclear study in 10/2004   Asthma    Chest pain, unspecified    Chronic kidney disease, unspecified     Chronic obstructive pulmonary disease, unspecified (HCC)    Chronic pelvic pain in female    Colitis due to Clostridium difficile 1994   1994   COPD (chronic obstructive pulmonary disease) (HCC)    Depression with anxiety    Diarrhea, unspecified    Disorder of thyroid , unspecified    Gastro-esophageal reflux disease without esophagitis    Gastroesophageal reflux disease    Hyperlipidemia    Hyperlipidemia, unspecified    Hypertension    Hypertensive chronic kidney disease with stage 1 through stage 4 chronic kidney disease, or unspecified chronic kidney disease    Hyperthyroidism    Hypothyroidism    Hypothyroidism, unspecified    Kidney disease, chronic, stage III (moderate, EGFR 30-59 ml/min) (HCC)    Lower abdominal pain, unspecified    Major depressive disorder, single episode, severe without psychotic features (HCC)    Major depressive disorder, single episode, unspecified    Nausea    Other postherpetic nervous system involvement    Pelvic and perineal pain    Rash and other nonspecific skin eruption    S/P colonoscopy 2007   few diverticula, otherwise nl   S/P colonoscopy 12/13/2010   rectal, cecal polyp, left-sided diverticulosis, hyperplastic. ?anal fissure? treated empirically   S/P endoscopy 2009   linear esophageal erosions   S/P endoscopy 05/10/2010   Question island of salmon-colored epithelium in distal esophagus ; no Barrett's.    Shingles    Tobacco abuse, in remission  55-pack-year consumption; discontinued 08/2010   Unspecified asthma, uncomplicated    Unspecified osteoarthritis, unspecified site    Urinary tract infection, site not specified    Uterine cancer [facility])     Past Surgical History:  Procedure Laterality Date   ABDOMINAL HYSTERECTOMY     BIOPSY  12/06/2022   Procedure: BIOPSY;  Surgeon: Shaaron Lamar HERO, MD;  Location: [facility];  Service: Endoscopy;;   BREAST LUMPECTOMY     CATARACT EXTRACTION     CHOLECYSTECTOMY  1962   COLONOSCOPY   12/13/2010   Left-sided diverticulosis.  Cecal polyp, status post hot snare polypectomy/ Diminutive rectal polyp, status post cold biopsy removal tender/painful anal canal, ? occult anal fissure- Not visualized   COLONOSCOPY N/A 10/14/2015   Diverticulosis   ESOPHAGOGASTRODUODENOSCOPY  05/10/10   benign mucosa with mild chronic inflammation.   ESOPHAGOGASTRODUODENOSCOPY (EGD) WITH PROPOFOL  N/A 02/13/2019   Procedure: ESOPHAGOGASTRODUODENOSCOPY (EGD) WITH PROPOFOL ;  Surgeon: Shaaron Lamar HERO, MD;  Location: [facility];  Service: Endoscopy;  Laterality: N/A;  3:00pm   ESOPHAGOGASTRODUODENOSCOPY (EGD) WITH PROPOFOL  N/A 12/06/2022   Procedure: ESOPHAGOGASTRODUODENOSCOPY (EGD) WITH PROPOFOL ;  Surgeon: Shaaron Lamar HERO, MD;  Location: [facility];  Service: Endoscopy;  Laterality: N/A;  2:30 PM, ASA 3/4   FLEXIBLE SIGMOIDOSCOPY  12/29/2011   Procedure: FLEXIBLE SIGMOIDOSCOPY;  Surgeon: Claudis RAYMOND Rivet, MD;  Location: [facility];  Service: Endoscopy;  Laterality: N/A;  200-Ann notified pt to be here @ 1:00   NISSEN FUNDOPLICATION     YAG LASER APPLICATION Left 05/25/2014   Procedure: YAG LASER APPLICATION;  Surgeon: Dow JULIANNA Burke, MD;  Location: [facility];  Service: Ophthalmology;  Laterality: Left;    Family History  Problem Relation Age of Onset   Heart disease Father    Diabetes Mother    Alzheimer's disease Mother    Cancer Mother    Heart disease Brother        Also mother, Father, brother and 2 sons   Hypertension Brother        also Sister x2   Cancer Sister    Cancer Son        testicular cancer    Aneurysm Son    Diabetes Son    Colon cancer Neg Hx     Social History   Socioeconomic History   Marital status: Married    Spouse name: Not on file   Number of children: 4   Years of education: Not on file   Highest education level: Not on file  Occupational History   Not on file  Tobacco Use   Smoking status: Former    Current packs/day: 0.00    Average packs/day: 1  pack/day for 55.0 years (55.0 ttl pk-yrs)    Types: Cigarettes    Start date: 09/15/1955    Quit date: 09/15/2010    Years since quitting: 13.3   Smokeless tobacco: Never  Vaping Use   Vaping status: Never Used  Substance and Sexual Activity   Alcohol  use: No    Alcohol /week: 0.0 standard drinks of alcohol    Drug use: No   Sexual activity: Not Currently    Birth control/protection: Surgical    Comment: hyst  Other Topics Concern   Not on file  Social History Narrative   Not on file   Social Drivers of Health   Financial Resource Strain: High Risk (02/07/2023)   Overall Financial Resource Strain (CARDIA)    Difficulty of Paying Living Expenses: Very hard  Food Insecurity:  No Food Insecurity (02/07/2023)   Hunger Vital Sign    Worried About Running Out of Food in the Last Year: Never true    Ran Out of Food in the Last Year: Never true  Transportation Needs: No Transportation Needs (02/07/2023)   PRAPARE - Administrator, Civil Service (Medical): No    Lack of Transportation (Non-Medical): No  Physical Activity: Inactive (02/07/2023)   Exercise Vital Sign    Days of Exercise per Week: 0 days    Minutes of Exercise per Session: 10 min  Stress: No Stress Concern Present (02/07/2023)   [employer] of Occupational Health - Occupational Stress Questionnaire    Feeling of Stress : Not at all  Social Connections: Moderately Isolated (02/07/2023)   Social Connection and Isolation Panel    Frequency of Communication with Friends and Family: Twice a week    Frequency of Social Gatherings with Friends and Family: Once a week    Attends Religious Services: Never    Database Administrator or Organizations: No    Attends Banker Meetings: Never    Marital Status: Married  Catering Manager Violence: Not At Risk (02/07/2023)   Humiliation, Afraid, Rape, and Kick questionnaire    Fear of Current or Ex-Partner: No    Emotionally Abused: No    Physically  Abused: No    Sexually Abused: No    Outpatient Medications Prior to Visit  Medication Sig Dispense Refill   albuterol  (VENTOLIN  HFA) 108 (90 Base) MCG/ACT inhaler Inhale 1-2 puffs into the lungs every 6 (six) hours as needed for wheezing or shortness of breath. 8 g 5   amLODipine  (NORVASC ) 5 MG tablet TAKE ONE TABLET BY MOUTH EVERY DAY 30 tablet 5   atorvastatin  (LIPITOR) 20 MG tablet TAKE ONE TABLET BY MOUTH ONCE DAILY. 90 tablet 0   budeson-glycopyrrolate -formoterol (BREZTRI  AEROSPHERE) 160-9-4.8 MCG/ACT AERO inhaler Inhale 2 puffs into the lungs 2 (two) times daily. 10.7 g 2   carvedilol  (COREG ) 6.25 MG tablet TAKE ONE TABLET BY MOUTH TWICE DAILY. 60 tablet 5   esomeprazole  (NEXIUM ) 40 MG capsule Take 1 capsule (40 mg total) by mouth 2 (two) times daily before a meal. 180 capsule 3   furosemide  (LASIX ) 20 MG tablet Take 1 tablet (20 mg total) by mouth daily. 90 tablet 3   ipratropium-albuterol  (DUONEB) 0.5-2.5 (3) MG/3ML SOLN INHALE 1 VIAL VIA NEBULIZER EVERY (6) HOURS AS NEEDED. 360 mL 2   levothyroxine  (SYNTHROID ) 75 MCG tablet TAKE 1 TABLET BY MOUTH DAILY BEFORE BREAKFAST 90 tablet 1   metoCLOPramide  (REGLAN ) 5 MG tablet Take 1 tablet (5 mg total) by mouth every 8 (eight) hours as needed for nausea. 60 tablet 2   mirtazapine  (REMERON ) 7.5 MG tablet Take 1 tablet (7.5 mg total) by mouth at bedtime. 30 tablet 5   nitrofurantoin , macrocrystal-monohydrate, (MACROBID ) 100 MG capsule Take 1 capsule (100 mg total) by mouth 2 (two) times daily. 10 capsule 0   oxyCODONE -acetaminophen  (PERCOCET) 7.5-325 MG tablet Take 1 tablet by mouth 3 (three) times daily as needed.     No facility-administered medications prior to visit.    Allergies  Allergen Reactions   Tessalon  [Benzonatate ]     Hallucinations   Hydralazine  Other (See Comments)    Breathing   Nalbuphine Rash   Paroxetine Itching   Sulfa Antibiotics Other (See Comments)    Childhood reaction.   Venlafaxine Itching    ROS Review  of Systems  Constitutional:  Positive for fatigue.  Negative for appetite change, chills and fever.  HENT:  Negative for congestion, sinus pressure, sinus pain and sore throat.   Eyes:  Negative for pain and discharge.  Respiratory:  Positive for shortness of breath. Negative for cough.   Cardiovascular:  Positive for leg swelling. Negative for chest pain and palpitations.  Gastrointestinal:  Positive for nausea. Negative for abdominal pain, diarrhea and vomiting.  Endocrine: Negative for polydipsia and polyuria.  Genitourinary:  Positive for dysuria and frequency. Negative for hematuria.  Musculoskeletal:  Positive for arthralgias and back pain. Negative for neck pain and neck stiffness.  Skin:  Negative for rash.  Neurological:  Positive for weakness. Negative for dizziness.  Psychiatric/Behavioral:  Positive for sleep disturbance. Negative for agitation and behavioral problems.       Objective:    Physical Exam Vitals reviewed.  Constitutional:      General: She is not in acute distress.    Appearance: She is not diaphoretic.     Comments: In wheelchair  HENT:     Head: Normocephalic and atraumatic.     Nose: Nose normal.     Mouth/Throat:     Mouth: Mucous membranes are moist.  Eyes:     General: No scleral icterus.    Extraocular Movements: Extraocular movements intact.  Cardiovascular:     Rate and Rhythm: Normal rate and regular rhythm.     Heart sounds: Normal heart sounds. No murmur heard. Pulmonary:     Breath sounds: Normal breath sounds. No wheezing or rales.  Abdominal:     Palpations: Abdomen is soft.     Tenderness: There is no abdominal tenderness.  Musculoskeletal:        General: Swelling (Right knee, no warmth) present.     Cervical back: Neck supple. No tenderness.     Right lower leg: No edema.     Left lower leg: No edema.  Skin:    General: Skin is warm.     Findings: Erythema (Senile purpuric lesions (b/l UE)) present.  Neurological:      General: No focal deficit present.     Mental Status: She is alert and oriented to person, place, and time. Mental status is at baseline.     Sensory: No sensory deficit.     Motor: Weakness (3+ in b/l UE and LE) present.     Gait: Gait abnormal.  Psychiatric:        Mood and Affect: Mood normal.        Behavior: Behavior normal.     BP (!) 152/82 (BP Location: Left Arm)   Pulse 64   Ht 5' (1.524 m)   Wt 110 lb 12.8 oz (50.3 kg)   SpO2 93%   BMI 21.64 kg/m  Wt Readings from Last 3 Encounters:  01/30/24 110 lb 12.8 oz (50.3 kg)  07/30/23 114 lb 9.6 oz (52 kg)  03/08/23 109 lb 1.3 oz (49.5 kg)    Lab Results  Component Value Date   TSH 4.220 02/07/2023   Lab Results  Component Value Date   WBC 6.0 08/30/2023   HGB 12.3 08/30/2023   HCT 37.8 08/30/2023   MCV 90.9 08/30/2023   PLT 196 08/30/2023   Lab Results  Component Value Date   NA 135 08/30/2023   K 4.4 08/30/2023   CO2 19 (L) 08/30/2023   GLUCOSE 116 (H) 08/30/2023   BUN 41 (H) 08/30/2023   CREATININE 2.79 (H) 08/30/2023   BILITOT 0.7 03/05/2023   ALKPHOS 83 03/05/2023  AST 17 03/05/2023   ALT 13 03/05/2023   PROT 7.4 03/05/2023   ALBUMIN 3.5 03/05/2023   CALCIUM  8.7 (L) 08/30/2023   ANIONGAP 7 08/30/2023   EGFR 19.0 10/31/2022   Lab Results  Component Value Date   CHOL 148 02/07/2023   Lab Results  Component Value Date   HDL 58 02/07/2023   Lab Results  Component Value Date   LDLCALC 75 02/07/2023   Lab Results  Component Value Date   TRIG 78 02/07/2023   Lab Results  Component Value Date   CHOLHDL 2.6 02/07/2023   Lab Results  Component Value Date   HGBA1C 5.5 02/07/2023      Assessment & Plan:   Problem List Items Addressed This Visit   None Visit Diagnoses       Urinary tract infection without hematuria, site unspecified    -  Primary   Relevant Orders   Urinalysis       No orders of the defined types were placed in this encounter.   Follow-up: No follow-ups on  file.    Suzzane MARLA Blanch, MD

## 2024-01-30 NOTE — Assessment & Plan Note (Signed)
 Lab Results  Component Value Date   TSH 4.220 02/07/2023    Had decreased dose of levothyroxine  to 75 mcg QD Check TSH, free T4 Reviewed US  thyroid 

## 2024-01-30 NOTE — Patient Instructions (Signed)
 Please start taking Macrobid  as prescribed.  Please continue to take medications as prescribed.  Please continue to follow low salt diet and ambulate as tolerated.

## 2024-01-30 NOTE — Assessment & Plan Note (Signed)
 BP Readings from Last 1 Encounters:  01/30/24 138/84   Well-controlled for her age, with amlodipine  5 mg QD, Coreg  6.25 mg BID and Lasix  20 mg once daily PRN (leg swelling) Chlorthalidone  12.5 mg once daily has been discontinued by nephrology due to worsening of CKD Counseled for compliance with the medications Advised DASH diet Chronic pain and CKD also contributing to high BP Followed by Nephrologist

## 2024-01-30 NOTE — Assessment & Plan Note (Signed)
 UA reviewed, LE + Started empiric Macrobid  Check urine culture

## 2024-01-30 NOTE — Assessment & Plan Note (Signed)
Cognitive decline with insomnia and episodes of hallucinations - at her stage, donepezil or memantine would not be helpful and will add side effects, including dizziness and hyponatremia Remeron for insomnia Needs frequent reorientation Dependent for ADLs and IADLs, has good family support

## 2024-01-30 NOTE — Assessment & Plan Note (Signed)
 Chronic leg swelling and intermittent dyspnea Needs leg elevation for leg swelling Advised to take Lasix  20 mg PRN

## 2024-01-31 ENCOUNTER — Ambulatory Visit: Payer: Self-pay | Admitting: Internal Medicine

## 2024-02-01 DIAGNOSIS — L219 Seborrheic dermatitis, unspecified: Secondary | ICD-10-CM | POA: Insufficient documentation

## 2024-02-01 LAB — TSH+FREE T4
Free T4: 1.42 ng/dL (ref 0.82–1.77)
TSH: 3.08 u[IU]/mL (ref 0.450–4.500)

## 2024-02-01 LAB — VITAMIN D 25 HYDROXY (VIT D DEFICIENCY, FRACTURES): Vit D, 25-Hydroxy: 26.5 ng/mL — ABNORMAL LOW (ref 30.0–100.0)

## 2024-02-01 LAB — BRAIN NATRIURETIC PEPTIDE: BNP: 20.6 pg/mL (ref 0.0–100.0)

## 2024-02-01 NOTE — Assessment & Plan Note (Signed)
 Rash over scalp and forehead likely due to seborrheic dermatitis Ketoconazole shampoo prescribed

## 2024-02-01 NOTE — Assessment & Plan Note (Signed)
 On Nexium  40 mg twice daily, followed by GI Has tried omeprazole  and rabeprazole  in the past Gas-X or Mylanta as needed for bloating Her chronic nausea could be due to gastroparesis- Reglan  as needed for nausea

## 2024-02-01 NOTE — Assessment & Plan Note (Signed)
 Physical exam as documented. Recent blood tests reviewed. Gets routine blood work at Nephrology. Flu vaccine today. Advised to get Shingrix vaccine at local pharmacy.

## 2024-02-01 NOTE — Assessment & Plan Note (Signed)
 Follows up with nephrologist Follows up with hematooncologist for evaluation of elevated light chain levels Advised to follow renal diet as advised by nephrologist and dietitian Avoid nephrotoxic agents including NSAIDs Last BMP reviewed from nephrology visit - continue to follow up regularly

## 2024-02-04 LAB — URINALYSIS
Bilirubin, UA: NEGATIVE
Glucose, UA: NEGATIVE
Ketones, UA: NEGATIVE
Nitrite, UA: NEGATIVE
Specific Gravity, UA: 1.025 (ref 1.005–1.030)
Urobilinogen, Ur: 0.2 mg/dL (ref 0.2–1.0)
pH, UA: 6 (ref 5.0–7.5)

## 2024-02-05 LAB — URINE CULTURE

## 2024-02-07 ENCOUNTER — Other Ambulatory Visit: Payer: Self-pay

## 2024-02-08 ENCOUNTER — Other Ambulatory Visit: Payer: Self-pay | Admitting: Internal Medicine

## 2024-02-08 DIAGNOSIS — I2510 Atherosclerotic heart disease of native coronary artery without angina pectoris: Secondary | ICD-10-CM

## 2024-02-08 MED ORDER — ATORVASTATIN CALCIUM 20 MG PO TABS: 20.0000 mg | ORAL_TABLET | Freq: Every day | ORAL | 0 refills | Status: DC

## 2024-03-07 ENCOUNTER — Other Ambulatory Visit: Payer: Self-pay | Admitting: Internal Medicine

## 2024-03-07 DIAGNOSIS — I2510 Atherosclerotic heart disease of native coronary artery without angina pectoris: Secondary | ICD-10-CM

## 2024-03-10 ENCOUNTER — Ambulatory Visit: Admitting: Internal Medicine

## 2024-03-13 ENCOUNTER — Encounter: Payer: Self-pay | Admitting: Nurse Practitioner

## 2024-03-13 ENCOUNTER — Ambulatory Visit: Attending: Nurse Practitioner | Admitting: Nurse Practitioner

## 2024-03-13 VITALS — BP 160/90 | HR 60 | Ht 60.0 in | Wt 110.0 lb

## 2024-03-13 DIAGNOSIS — E785 Hyperlipidemia, unspecified: Secondary | ICD-10-CM | POA: Diagnosis not present

## 2024-03-13 DIAGNOSIS — I2510 Atherosclerotic heart disease of native coronary artery without angina pectoris: Secondary | ICD-10-CM

## 2024-03-13 DIAGNOSIS — I5189 Other ill-defined heart diseases: Secondary | ICD-10-CM | POA: Diagnosis not present

## 2024-03-13 DIAGNOSIS — I361 Nonrheumatic tricuspid (valve) insufficiency: Secondary | ICD-10-CM | POA: Diagnosis not present

## 2024-03-13 DIAGNOSIS — I340 Nonrheumatic mitral (valve) insufficiency: Secondary | ICD-10-CM | POA: Diagnosis not present

## 2024-03-13 DIAGNOSIS — I10 Essential (primary) hypertension: Secondary | ICD-10-CM | POA: Diagnosis not present

## 2024-03-13 DIAGNOSIS — Z79899 Other long term (current) drug therapy: Secondary | ICD-10-CM | POA: Diagnosis not present

## 2024-03-13 DIAGNOSIS — N184 Chronic kidney disease, stage 4 (severe): Secondary | ICD-10-CM | POA: Diagnosis not present

## 2024-03-13 NOTE — Progress Notes (Unsigned)
 Cardiology Office Note:    Date:  03/03/2022  ID:  ZARYIA MARKEL, DOB 1936/03/01, MRN [id number]  PCP:  Tobie Suzzane POUR, MD   [facility] Providers Cardiologist:  Diannah SHAUNNA Maywood, MD     Referring MD: Tobie Suzzane POUR, MD   CC: Here for overdue follow-up  History of Present Illness:    [name] is a 88 y.o. female with a hx of the following:  Nonobstructive CAD HTN HLD CKD stage 4 Hx of palpitations COPD Asthma  Patient is a 88 year old female with past medical history as mentioned above.  She underwent heart cath in 2002 that revealed nonobstructive CAD, low risk Myoview  in 2013, and previous monitor for palpitations revealed PACs, PVCs, and SVT.   Last seen by Laymon Qua, PA-C on June 24, 2020.  Was overall doing well from a cardiac perspective.  Chief complaint office visit was worsening bilateral arthritis in knees.  Follows nephrology.  Was told to follow-up in 6 months.   Today she presents for overdue follow-up visit.  She states she is doing well. Denies any cardiac issues or complaints. Denies any changes to her health since her last visit. Son who is present in the room says she does have [facility].  Denies any chest pain, shortness of breath, palpitations, syncope, presyncope, dizziness, orthopnea, PND, swelling, significant weight changes, acute bleeding, or claudication.  Says she sees a kidney doctor next week.  Says the only reason she is here today is to get her medications refilled.  Denies any other questions or concerns today.  ____  Last seen by Dr. Mallipeddi on December 05, 2022. Was noted pt had some deconditioning, some mild SOB, some dizziness while walking, dehydrated, noted occasional palpitations. Chlorthalidone  was changed to Lasix  PRN. Pt deferred Echo.   Here for 1 year follow-up. She states...   CMET, FLP   Past Medical History:  Diagnosis Date   Anxiety disorder, unspecified    Arteriosclerotic cardiovascular disease  (ASCVD) 2006   Nonobstructive; < 50% lesions on cath 2002; negative stress nuclear study in 10/2004   Asthma    Chest pain, unspecified    Chronic kidney disease, unspecified    Chronic obstructive pulmonary disease, unspecified (HCC)    Chronic pelvic pain in female    Colitis due to Clostridium difficile 1994   1994   COPD (chronic obstructive pulmonary disease) (HCC)    Depression with anxiety    Diarrhea, unspecified    Disorder of thyroid , unspecified    Gastro-esophageal reflux disease without esophagitis    Gastroesophageal reflux disease    Hyperlipidemia    Hyperlipidemia, unspecified    Hypertension    Hypertensive chronic kidney disease with stage 1 through stage 4 chronic kidney disease, or unspecified chronic kidney disease    Hyperthyroidism    Hypothyroidism    Hypothyroidism, unspecified    Kidney disease, chronic, stage III (moderate, EGFR 30-59 ml/min) (HCC)    Lower abdominal pain, unspecified    Major depressive disorder, single episode, severe without psychotic features (HCC)    Major depressive disorder, single episode, unspecified    Nausea    Other postherpetic nervous system involvement    Pelvic and perineal pain    Rash and other nonspecific skin eruption    S/P colonoscopy 2007   few diverticula, otherwise nl   S/P colonoscopy 12/13/2010   rectal, cecal polyp, left-sided diverticulosis, hyperplastic. ?anal fissure? treated empirically   S/P endoscopy 2009   linear esophageal erosions  S/P endoscopy 05/10/2010   Question island of salmon-colored epithelium in distal esophagus ; no Barrett's.    Shingles    Tobacco abuse, in remission    55-pack-year consumption; discontinued 08/2010   Unspecified asthma, uncomplicated    Unspecified osteoarthritis, unspecified site    Urinary tract infection, site not specified    Uterine cancer [facility])     Past Surgical History:  Procedure Laterality Date   ABDOMINAL HYSTERECTOMY     BIOPSY  12/06/2022    Procedure: BIOPSY;  Surgeon: Shaaron Lamar HERO, MD;  Location: [facility];  Service: Endoscopy;;   BREAST LUMPECTOMY     CATARACT EXTRACTION     CHOLECYSTECTOMY  1962   COLONOSCOPY  12/13/2010   Left-sided diverticulosis.  Cecal polyp, status post hot snare polypectomy/ Diminutive rectal polyp, status post cold biopsy removal tender/painful anal canal, ? occult anal fissure- Not visualized   COLONOSCOPY N/A 10/14/2015   Diverticulosis   ESOPHAGOGASTRODUODENOSCOPY  05/10/10   benign mucosa with mild chronic inflammation.   ESOPHAGOGASTRODUODENOSCOPY (EGD) WITH PROPOFOL  N/A 02/13/2019   Procedure: ESOPHAGOGASTRODUODENOSCOPY (EGD) WITH PROPOFOL ;  Surgeon: Shaaron Lamar HERO, MD;  Location: [facility];  Service: Endoscopy;  Laterality: N/A;  3:00pm   ESOPHAGOGASTRODUODENOSCOPY (EGD) WITH PROPOFOL  N/A 12/06/2022   Procedure: ESOPHAGOGASTRODUODENOSCOPY (EGD) WITH PROPOFOL ;  Surgeon: Shaaron Lamar HERO, MD;  [facility]: [facility];  Service: Endoscopy;  Laterality: N/A;  2:30 PM, ASA 3/4   FLEXIBLE SIGMOIDOSCOPY  12/29/2011   Procedure: FLEXIBLE SIGMOIDOSCOPY;  Surgeon: Claudis RAYMOND Rivet, MD;  Location: [facility];  Service: Endoscopy;  Laterality: N/A;  200-Ann notified pt to be here @ 1:00   NISSEN FUNDOPLICATION     YAG LASER APPLICATION Left 05/25/2014   Procedure: YAG LASER APPLICATION;  Surgeon: Dow JULIANNA Burke, MD;  Location: [facility];  Service: Ophthalmology;  Laterality: Left;    Current Medications: Current Meds  Medication Sig   albuterol  (VENTOLIN  HFA) 108 (90 Base) MCG/ACT inhaler Inhale 1-2 puffs into the lungs every 6 (six) hours as needed for wheezing or shortness of breath.   chlorthalidone  (HYGROTON ) 25 MG tablet Take 12.5 mg by mouth daily.   ipratropium-albuterol  (DUONEB) 0.5-2.5 (3) MG/3ML SOLN Take 3 mLs by nebulization every 6 (six) hours as needed.   levothyroxine  (SYNTHROID ) 88 MCG tablet TAKE (1) TABLET BY MOUTH EACH MORNING.   lidocaine  (XYLOCAINE ) 2 % solution Use as  directed 10 mLs in the mouth or throat as needed for mouth pain.   mirtazapine  (REMERON ) 7.5 MG tablet Take 1 tablet (7.5 mg total) by mouth at bedtime.   nystatin  (MYCOSTATIN ) 100000 UNIT/ML suspension Take 5 mLs (500,000 Units total) by mouth 4 (four) times daily.   omeprazole  (PRILOSEC) 40 MG capsule Take 1 capsule (40 mg total) by mouth daily.   ondansetron  (ZOFRAN ) 4 MG tablet TAKE 1 TABLET BY MOUTH THREE TIMES AS NEEDED FOR NAUSEA.   oxyCODONE -acetaminophen  (PERCOCET/ROXICET) 5-325 MG tablet Take 1 tablet by mouth every 6 (six) hours as needed.   UNABLE TO FIND Wheelchair (small) - OA and physical deconditioning (M19.90, R53.81)   UNABLE TO FIND Hand held shower head with long cord    atorvastatin  (LIPITOR) 20 MG tablet TAKE ONE TABLET BY MOUTH ONCE DAILY.    carvedilol  (COREG ) 6.25 MG tablet Take 1 tablet (6.25 mg total) by mouth 2 (two) times daily with a meal.     Allergies:   Tessalon  [benzonatate ], Hydralazine , Nalbuphine, Paroxetine, Sulfa antibiotics, and Venlafaxine   Social History   Socioeconomic History  Marital status: Married    Spouse name: Not on file   Number of children: 4   Years of education: Not on file   Highest education level: Not on file  Occupational History   Not on file  Tobacco Use   Smoking status: Former    Current packs/day: 0.00    Average packs/day: 1 pack/day for 55.0 years (55.0 ttl pk-yrs)    Types: Cigarettes    Start date: 09/15/1955    Quit date: 09/15/2010    Years since quitting: 13.5   Smokeless tobacco: Never  Vaping Use   Vaping status: Never Used  Substance and Sexual Activity   Alcohol  use: No    Alcohol /week: 0.0 standard drinks of alcohol    Drug use: No   Sexual activity: Not Currently    Birth control/protection: Surgical    Comment: hyst  Other Topics Concern   Not on file  Social History Narrative   Not on file   Social Drivers of Health   Tobacco Use: Medium Risk (01/30/2024)   Patient History    Smoking  Tobacco Use: Former    Smokeless Tobacco Use: Never    Passive Exposure: Not on Actuary Strain: High Risk (02/07/2023)   Overall Financial Resource Strain (CARDIA)    Difficulty of Paying Living Expenses: Very hard  Food Insecurity: No Food Insecurity (02/07/2023)   Hunger Vital Sign    Worried About Running Out of Food in the Last Year: Never true    Ran Out of Food in the Last Year: Never true  Transportation Needs: No Transportation Needs (02/07/2023)   PRAPARE - Administrator, Civil Service (Medical): No    Lack of Transportation (Non-Medical): No  Physical Activity: Inactive (02/07/2023)   Exercise Vital Sign    Days of Exercise per Week: 0 days    Minutes of Exercise per Session: 10 min  Stress: No Stress Concern Present (02/07/2023)   [employer] of Occupational Health - Occupational Stress Questionnaire    Feeling of Stress : Not at all  Social Connections: Moderately Isolated (02/07/2023)   Social Connection and Isolation Panel    Frequency of Communication with Friends and Family: Twice a week    Frequency of Social Gatherings with Friends and Family: Once a week    Attends Religious Services: Never    Database Administrator or Organizations: No    Attends Banker Meetings: Never    Marital Status: Married  Depression (PHQ2-9): Low Risk (01/30/2024)   Depression (PHQ2-9)    PHQ-2 Score: 0  Alcohol  Screen: Low Risk (02/07/2023)   Alcohol  Screen    Last Alcohol  Screening Score (AUDIT): 0  Housing: Low Risk (02/07/2023)   Housing    Last Housing Risk Score: 0  Utilities: Not At Risk (02/07/2023)   AHC Utilities    Threatened with loss of utilities: No  Health Literacy: Not on file     Family History: The patient's family history includes Alzheimer's disease in her mother; Aneurysm in her son; Cancer in her mother, sister, and son; Diabetes in her mother and son; Heart disease in her brother and father; Hypertension in  her brother. There is no history of Colon cancer.  ROS:   Review of Systems  Constitutional:  Positive for malaise/fatigue. Negative for chills, diaphoresis, fever and weight loss.  HENT: Negative.    Eyes: Negative.   Respiratory: Negative.    Cardiovascular: Negative.   Gastrointestinal: Negative.   Genitourinary:  Negative.   Musculoskeletal:  Positive for joint pain. Negative for back pain, falls, myalgias and neck pain.  Skin: Negative.   Neurological: Negative.   Endo/Heme/Allergies: Negative.   Psychiatric/Behavioral: Negative.      Please see the history of present illness.    All other systems reviewed and are negative.  EKGs/Labs/Other Studies Reviewed:    The following studies were reviewed today:   EKG:  EKG is not ordered today.  EKG dated Aug 02, 2021 revealed sinus rhythm, 80 bpm, otherwise nothing acute.  Cardiac monitor on September 18, 2019: Sinus rhythm  With occasional PACs, short burst SVT(5 beats)   42 to 171 bpm   Average HR 60 NO significant pauses   2D echocardiogram on October 07, 2015: Left ventricle: The cavity size was normal. Wall thickness was    normal. Systolic function was normal. The estimated ejection    fraction was in the range of 55% to 60%. Wall motion was normal;    there were no regional wall motion abnormalities. Doppler    parameters are consistent with abnormal left ventricular    relaxation (grade 1 diastolic dysfunction).  - Aortic valve: Trileaflet; mildly calcified leaflets.  - Mitral valve: Mildly thickened leaflets . There was mild    regurgitation.  - Left atrium: The atrium was at the upper limits of normal in    size.  - Right atrium: Central venous pressure (est): 3 mm Hg.  - Tricuspid valve: There was trivial regurgitation.  - Pulmonary arteries: PA peak pressure: 34 mm Hg (S).  - Pericardium, extracardiac: There was no pericardial effusion.  Lexiscan on April 19, 2011: Overall low risk exercise Myoview  as outlined.   There were no  diagnostic ST-segment changes or arrhythmias.  Hypertensive  response to exercise was noted.  Perfusion imaging is most  consistent with soft tissue attenuation affecting the anteroseptal  / anterior apical wall.  No definite ischemia.  LVEF is 63%.   Recent Labs: 08/30/2023: BUN 41; Creatinine, Ser 2.79; Hemoglobin 12.3; Platelets 196; Potassium 4.4; Sodium 135 01/30/2024: BNP 20.6; TSH 3.080  Recent Lipid Panel    Component Value Date/Time   CHOL 148 02/07/2023 0758   TRIG 78 02/07/2023 0758   HDL 58 02/07/2023 0758   CHOLHDL 2.6 02/07/2023 0758   CHOLHDL 2.9 07/15/2008 0528   VLDL 23 07/15/2008 0528   LDLCALC 75 02/07/2023 0758     Risk Assessment/Calculations:      No BP recorded.  {Refresh Note OR Click here to enter BP  :1}***  Physical Exam:    VS:  There were no vitals taken for this visit.    Wt Readings from Last 3 Encounters:  01/30/24 110 lb 12.8 oz (50.3 kg)  07/30/23 114 lb 9.6 oz (52 kg)  03/08/23 109 lb 1.3 oz (49.5 kg)     GEN: Thin, frail 88 y.o. female in NAD  HEENT: Normal NECK: No JVD; No carotid bruits CARDIAC: S1/S2, RRR, no murmurs, rubs, gallops; 2+ peripheral pulses throughout, strong and equal bilaterally RESPIRATORY:  Clear and diminished to auscultation without rales, wheezing or rhonchi  MUSCULOSKELETAL:  No edema; thoracic kyphosis noted, otherwise no deformity SKIN: Thin skin, warm and dry NEUROLOGIC:  Alert and oriented x 3 PSYCHIATRIC:  Normal affect   ASSESSMENT:    No diagnosis found.  PLAN:    In order of problems listed above:  Nonobstructive CAD Stable with no anginal symptoms. No indication for ischemic evaluation.  Continue atorvastatin  and carvedilol . Will refill  medications today. Heart healthy diet and regular cardiovascular exercise as tolerated encouraged.   HTN Blood pressure on arrival 158/82, repeat BP by me 140/82.  Son does admit to patient having a history of whitecoat hypertension. Discussed  to monitor BP at home at least 2 hours after medications and sitting for 5-10 minutes. Continue carvedilol  and chlorthalidone . Heart healthy diet and regular cardiovascular exercise as tolerated encouraged. Appreciate recs by Nephrology.  HLD No recent lipid panel on file.  She is requesting to have this lab work drawn at nephrology office for appointment next week.  Continue atorvastatin  and we will refill this today. Heart healthy diet and regular cardiovascular exercise as tolerated encouraged.   CKD stage 4 Labs from July 2023 revealed serum creatinine 2.22 with a eGFR at 21.  Continue to follow-up with nephrology.  Fatigue, Anemia Labs from July 2023 revealed hemoglobin 9.5, hematocrit 29.7, most likely due to CKD stage 4. I do not see a recent iron panel on file. Will defer to PCP. Continue to follow with PCP.  6. Diastolic dysfunction, mitral regurgitation, tricuspid regurgitation Echocardiogram in 2017 revealed EF 55 to 60%, grade 1 DD, mild MR, trivial TR, no other valvular abnormalities.  Recommend to repeat echocardiogram at next office visit with Dr. Mallipeddi, as she defers repeating Echocardiogram at this time and is asymptomatic.   Disposition: Follow up with Dr. Mallipeddi in 6 months or sooner if anything changes.   Medication Adjustments/Labs and Tests Ordered: Current medicines are reviewed at length with the patient today.  Concerns regarding medicines are outlined above.  No orders of the defined types were placed in this encounter.  No orders of the defined types were placed in this encounter.   There are no Patient Instructions on file for this visit.   Signed, Almarie Crate, NP  03/13/2024 2:58 PM    [facility]

## 2024-03-13 NOTE — Patient Instructions (Signed)
 Medication Instructions:   Continue all current medications.   Labwork:  FLP, CMET - orders given today Reminder:  Nothing to eat or drink after 12 midnight prior to labs. Office will contact with results via phone, letter or mychart.     Testing/Procedures:  none  Follow-Up:  6 months   Any Other Special Instructions Will Be Listed Below (If Applicable).   If you need a refill on your cardiac medications before your next appointment, please call your pharmacy.

## 2024-03-14 ENCOUNTER — Ambulatory Visit: Admitting: Internal Medicine

## 2024-03-25 LAB — COMPREHENSIVE METABOLIC PANEL WITH GFR
ALT: 10 IU/L (ref 0–32)
AST: 11 IU/L (ref 0–40)
Albumin: 3.9 g/dL (ref 3.7–4.7)
Alkaline Phosphatase: 99 IU/L (ref 48–129)
BUN/Creatinine Ratio: 16 (ref 12–28)
BUN: 45 mg/dL — ABNORMAL HIGH (ref 8–27)
Bilirubin Total: 0.2 mg/dL (ref 0.0–1.2)
CO2: 16 mmol/L — ABNORMAL LOW (ref 20–29)
Calcium: 9 mg/dL (ref 8.7–10.3)
Chloride: 111 mmol/L — ABNORMAL HIGH (ref 96–106)
Creatinine, Ser: 2.86 mg/dL — ABNORMAL HIGH (ref 0.57–1.00)
Globulin, Total: 2.9 g/dL (ref 1.5–4.5)
Glucose: 107 mg/dL — ABNORMAL HIGH (ref 70–99)
Potassium: 5.4 mmol/L — ABNORMAL HIGH (ref 3.5–5.2)
Sodium: 140 mmol/L (ref 134–144)
Total Protein: 6.8 g/dL (ref 6.0–8.5)
eGFR: 16 mL/min/1.73 — ABNORMAL LOW

## 2024-03-25 LAB — LIPID PANEL
Chol/HDL Ratio: 2.8 ratio (ref 0.0–4.4)
Cholesterol, Total: 144 mg/dL (ref 100–199)
HDL: 52 mg/dL
LDL Chol Calc (NIH): 75 mg/dL (ref 0–99)
Triglycerides: 90 mg/dL (ref 0–149)
VLDL Cholesterol Cal: 17 mg/dL (ref 5–40)

## 2024-03-26 ENCOUNTER — Ambulatory Visit: Payer: Self-pay | Admitting: Nurse Practitioner

## 2024-03-26 DIAGNOSIS — E875 Hyperkalemia: Secondary | ICD-10-CM

## 2024-03-28 ENCOUNTER — Other Ambulatory Visit: Payer: Self-pay

## 2024-03-28 DIAGNOSIS — Z79899 Other long term (current) drug therapy: Secondary | ICD-10-CM

## 2024-03-28 NOTE — Telephone Encounter (Signed)
-----   Message from Almarie Crate, NP sent at 03/26/2024  4:58 PM EST ----- Labs are relatively stable. Her potassium level is mildly elevated. Is she taking any multivitamins or any potassium supplements? If so, please have her stop and avoid foods high in potassium. Please  repeat a BMET in 2 weeks.   Thanks!  Almarie Crate, AGNP-C

## 2024-03-28 NOTE — Telephone Encounter (Signed)
 The patient has been notified of the result and verbalized understanding.  All questions (if any) were answered. Bernett Dorothyann LABOR, RN 03/28/2024 8:25 AM   Pt not on any potassium supplements  Will avoid potassium rich foods :vegetables like spinach, sweet potatoes, tomatoes, and winter squash; fruits such as bananas, avocados, oranges, melons, and dried apricots; legumes like beans, lentils, and peas; dairy like milk and yogurt; nuts and seeds, fish, and whole grains, with staples like potatoes, milk, and fruit juice being top sources in many diets.      Repeat BMET at [facility] in 2 weeks

## 2024-04-08 ENCOUNTER — Other Ambulatory Visit: Payer: Self-pay | Admitting: Gastroenterology

## 2024-04-10 NOTE — Telephone Encounter (Signed)
 Arrange nonurgent ov for med refill

## 2024-04-29 ENCOUNTER — Other Ambulatory Visit: Payer: Self-pay | Admitting: Internal Medicine

## 2024-04-29 DIAGNOSIS — R110 Nausea: Secondary | ICD-10-CM

## 2024-05-28 ENCOUNTER — Ambulatory Visit: Admitting: Gastroenterology

## 2024-08-01 ENCOUNTER — Ambulatory Visit: Admitting: Internal Medicine

## 2024-08-28 ENCOUNTER — Ambulatory Visit: Admitting: Nurse Practitioner
